# Patient Record
Sex: Male | Born: 1970 | Race: Black or African American | Hispanic: No | Marital: Married | Smoking: Former smoker
Health system: Southern US, Community
[De-identification: ages and names within clinical notes are randomized; demographics above are authoritative.]

## PROBLEM LIST (undated history)

## (undated) DIAGNOSIS — I509 Heart failure, unspecified: Secondary | ICD-10-CM

## (undated) DIAGNOSIS — T4145XA Adverse effect of unspecified anesthetic, initial encounter: Secondary | ICD-10-CM

## (undated) DIAGNOSIS — J069 Acute upper respiratory infection, unspecified: Secondary | ICD-10-CM

## (undated) DIAGNOSIS — I10 Essential (primary) hypertension: Secondary | ICD-10-CM

## (undated) DIAGNOSIS — G5600 Carpal tunnel syndrome, unspecified upper limb: Secondary | ICD-10-CM

## (undated) DIAGNOSIS — D1803 Hemangioma of intra-abdominal structures: Secondary | ICD-10-CM

## (undated) DIAGNOSIS — K760 Fatty (change of) liver, not elsewhere classified: Secondary | ICD-10-CM

## (undated) DIAGNOSIS — K219 Gastro-esophageal reflux disease without esophagitis: Secondary | ICD-10-CM

## (undated) DIAGNOSIS — M67919 Unspecified disorder of synovium and tendon, unspecified shoulder: Secondary | ICD-10-CM

## (undated) DIAGNOSIS — F419 Anxiety disorder, unspecified: Secondary | ICD-10-CM

## (undated) DIAGNOSIS — E119 Type 2 diabetes mellitus without complications: Secondary | ICD-10-CM

## (undated) DIAGNOSIS — G4730 Sleep apnea, unspecified: Secondary | ICD-10-CM

## (undated) DIAGNOSIS — T8859XA Other complications of anesthesia, initial encounter: Secondary | ICD-10-CM

## (undated) DIAGNOSIS — D563 Thalassemia minor: Secondary | ICD-10-CM

## (undated) HISTORY — DX: Sleep apnea, unspecified: G47.30

## (undated) HISTORY — DX: Heart failure, unspecified: I50.9

## (undated) HISTORY — DX: Gastro-esophageal reflux disease without esophagitis: K21.9

## (undated) HISTORY — PX: EYE SURGERY: SHX253

## (undated) HISTORY — DX: Unspecified disorder of synovium and tendon, unspecified shoulder: M67.919

## (undated) HISTORY — DX: Carpal tunnel syndrome, unspecified upper limb: G56.00

## (undated) HISTORY — DX: Type 2 diabetes mellitus without complications: E11.9

## (undated) HISTORY — DX: Fatty (change of) liver, not elsewhere classified: K76.0

---
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
## 2001-11-21 ENCOUNTER — Emergency Department (HOSPITAL_COMMUNITY): Admission: EM | Admit: 2001-11-21 | Discharge: 2001-11-21 | Payer: Self-pay | Admitting: Emergency Medicine

## 2001-11-21 ENCOUNTER — Encounter: Payer: Self-pay | Admitting: Emergency Medicine

## 2001-11-28 ENCOUNTER — Encounter (HOSPITAL_COMMUNITY): Admission: RE | Admit: 2001-11-28 | Discharge: 2001-12-28 | Payer: Self-pay | Admitting: Internal Medicine

## 2002-07-24 ENCOUNTER — Ambulatory Visit (HOSPITAL_COMMUNITY): Admission: RE | Admit: 2002-07-24 | Discharge: 2002-07-24 | Payer: Self-pay | Admitting: Family Medicine

## 2002-07-24 ENCOUNTER — Encounter: Payer: Self-pay | Admitting: Family Medicine

## 2003-12-30 ENCOUNTER — Ambulatory Visit (HOSPITAL_COMMUNITY): Admission: RE | Admit: 2003-12-30 | Discharge: 2003-12-30 | Payer: Self-pay | Admitting: Internal Medicine

## 2004-01-15 ENCOUNTER — Ambulatory Visit: Admission: RE | Admit: 2004-01-15 | Discharge: 2004-01-15 | Payer: Self-pay | Admitting: Internal Medicine

## 2004-01-25 ENCOUNTER — Ambulatory Visit (HOSPITAL_COMMUNITY): Admission: RE | Admit: 2004-01-25 | Discharge: 2004-01-25 | Payer: Self-pay | Admitting: Internal Medicine

## 2004-09-28 ENCOUNTER — Ambulatory Visit (HOSPITAL_COMMUNITY): Admission: RE | Admit: 2004-09-28 | Discharge: 2004-09-28 | Payer: Self-pay | Admitting: Internal Medicine

## 2005-10-05 ENCOUNTER — Ambulatory Visit (HOSPITAL_COMMUNITY): Admission: RE | Admit: 2005-10-05 | Discharge: 2005-10-05 | Payer: Self-pay | Admitting: Family Medicine

## 2005-10-10 ENCOUNTER — Observation Stay (HOSPITAL_COMMUNITY): Admission: AD | Admit: 2005-10-10 | Discharge: 2005-10-11 | Payer: Self-pay | Admitting: *Deleted

## 2005-10-10 ENCOUNTER — Encounter: Payer: Self-pay | Admitting: Emergency Medicine

## 2007-05-02 ENCOUNTER — Ambulatory Visit (HOSPITAL_COMMUNITY): Admission: RE | Admit: 2007-05-02 | Discharge: 2007-05-02 | Payer: Self-pay | Admitting: Internal Medicine

## 2007-07-02 ENCOUNTER — Ambulatory Visit (HOSPITAL_COMMUNITY): Admission: RE | Admit: 2007-07-02 | Discharge: 2007-07-02 | Payer: Self-pay | Admitting: Internal Medicine

## 2008-09-10 ENCOUNTER — Emergency Department (HOSPITAL_COMMUNITY): Admission: EM | Admit: 2008-09-10 | Discharge: 2008-09-10 | Payer: Self-pay | Admitting: Emergency Medicine

## 2008-12-15 ENCOUNTER — Observation Stay (HOSPITAL_COMMUNITY): Admission: EM | Admit: 2008-12-15 | Discharge: 2008-12-16 | Payer: Self-pay | Admitting: Emergency Medicine

## 2008-12-15 ENCOUNTER — Ambulatory Visit: Payer: Self-pay | Admitting: Cardiology

## 2008-12-16 ENCOUNTER — Encounter (INDEPENDENT_AMBULATORY_CARE_PROVIDER_SITE_OTHER): Payer: Self-pay | Admitting: Family Medicine

## 2009-08-17 ENCOUNTER — Ambulatory Visit (HOSPITAL_COMMUNITY): Admission: RE | Admit: 2009-08-17 | Discharge: 2009-08-17 | Payer: Self-pay | Admitting: Family Medicine

## 2010-03-27 ENCOUNTER — Encounter: Admission: RE | Admit: 2010-03-27 | Discharge: 2010-03-27 | Payer: Self-pay | Admitting: Sports Medicine

## 2010-09-16 ENCOUNTER — Emergency Department (HOSPITAL_COMMUNITY): Admission: EM | Admit: 2010-09-16 | Payer: Self-pay | Source: Home / Self Care

## 2010-11-22 LAB — DIFFERENTIAL
Basophils Absolute: 0 10*3/uL (ref 0.0–0.1)
Basophils Absolute: 0 10*3/uL (ref 0.0–0.1)
Basophils Relative: 0 % (ref 0–1)
Eosinophils Absolute: 0.1 10*3/uL (ref 0.0–0.7)
Eosinophils Relative: 1 % (ref 0–5)
Eosinophils Relative: 3 % (ref 0–5)
Lymphocytes Relative: 11 % — ABNORMAL LOW (ref 12–46)
Neutro Abs: 5.5 10*3/uL (ref 1.7–7.7)
Neutrophils Relative %: 67 % (ref 43–77)

## 2010-11-22 LAB — BASIC METABOLIC PANEL
BUN: 5 mg/dL — ABNORMAL LOW (ref 6–23)
CO2: 22 mEq/L (ref 19–32)
CO2: 28 mEq/L (ref 19–32)
Calcium: 9.3 mg/dL (ref 8.4–10.5)
Chloride: 102 mEq/L (ref 96–112)
Creatinine, Ser: 0.7 mg/dL (ref 0.4–1.5)
GFR calc Af Amer: >60 mL/min (ref >60)
GFR calc Af Amer: >60 mL/min (ref >60)
Potassium: 3.9 mEq/L (ref 3.5–5.1)
Sodium: 135 mEq/L (ref 135–145)

## 2010-11-22 LAB — POCT CARDIAC MARKERS
Troponin i, poc: <0.05 ng/mL (ref 0.00–0.09)
Troponin i, poc: <0.05 ng/mL (ref 0.00–0.09)

## 2010-11-22 LAB — CARDIAC PANEL(CRET KIN+CKTOT+MB+TROPI): CK, MB: 0.7 ng/mL (ref 0.3–4.0)

## 2010-11-22 LAB — LIPID PANEL
Cholesterol: 185 mg/dL (ref 0–200)
HDL: 37 mg/dL — ABNORMAL LOW (ref >39)
LDL Cholesterol: 107 mg/dL — ABNORMAL HIGH (ref 0–99)
Total CHOL/HDL Ratio: 5 RATIO

## 2010-11-22 LAB — CBC
HCT: 39.1 % (ref 39.0–52.0)
HCT: 43.1 % (ref 39.0–52.0)
Hemoglobin: 12.8 g/dL — ABNORMAL LOW (ref 13.0–17.0)
MCHC: 32.6 g/dL (ref 30.0–36.0)
MCV: 78.3 fL (ref 78.0–100.0)
Platelets: 210 10*3/uL (ref 150–400)
Platelets: 231 10*3/uL (ref 150–400)
RDW: 13.3 % (ref 11.5–15.5)

## 2010-11-22 LAB — HEMOGLOBIN A1C: Hgb A1c MFr Bld: 9.6 % — ABNORMAL HIGH (ref 4.6–6.1)

## 2010-11-22 LAB — BRAIN NATRIURETIC PEPTIDE: Pro B Natriuretic peptide (BNP): <30 pg/mL (ref 0.0–100.0)

## 2010-11-22 LAB — GLUCOSE, CAPILLARY
Glucose-Capillary: 183 mg/dL — ABNORMAL HIGH (ref 70–99)
Glucose-Capillary: 208 mg/dL — ABNORMAL HIGH (ref 70–99)
Glucose-Capillary: 220 mg/dL — ABNORMAL HIGH (ref 70–99)

## 2010-11-22 LAB — MAGNESIUM: Magnesium: 1.8 mg/dL (ref 1.5–2.5)

## 2010-11-28 LAB — DIFFERENTIAL
Basophils Absolute: 0 10*3/uL (ref 0.0–0.1)
Eosinophils Relative: 1 % (ref 0–5)
Lymphocytes Relative: 12 % (ref 12–46)
Lymphs Abs: 1.2 10*3/uL (ref 0.7–4.0)
Neutro Abs: 7.6 10*3/uL (ref 1.7–7.7)

## 2010-11-28 LAB — CULTURE, BLOOD (ROUTINE X 2)
Culture: NO GROWTH
Report Status: 2022010
Report Status: 2022010

## 2010-11-28 LAB — BASIC METABOLIC PANEL
BUN: 6 mg/dL (ref 6–23)
Calcium: 8.2 mg/dL — ABNORMAL LOW (ref 8.4–10.5)
GFR calc non Af Amer: >60 mL/min (ref >60)
Glucose, Bld: 140 mg/dL — ABNORMAL HIGH (ref 70–99)
Potassium: 3.7 mEq/L (ref 3.5–5.1)
Sodium: 135 mEq/L (ref 135–145)

## 2010-11-28 LAB — CBC
HCT: 37.7 % — ABNORMAL LOW (ref 39.0–52.0)
Hemoglobin: 12.5 g/dL — ABNORMAL LOW (ref 13.0–17.0)
Platelets: 173 10*3/uL (ref 150–400)
RDW: 13.4 % (ref 11.5–15.5)
WBC: 9.4 10*3/uL (ref 4.0–10.5)

## 2010-12-27 NOTE — Discharge Summary (Signed)
NAMETEX, [name] NO.:  [id number]   MEDICAL RECORD NO.:  [id number]          PATIENT TYPE:  OBV   LOCATION:  A302                          FACILITY:  [facility]   PHYSICIAN:  [name], DO    DATE OF BIRTH:  [birth year]   DATE OF ADMISSION:  12/15/2008  DATE OF DISCHARGE:  05/05/2010LH                               DISCHARGE SUMMARY   ADMISSION DIAGNOSES:  Chest pain, hypertension, obesity, and left-sided  numbness.   DISCHARGE DIAGNOSES:  Chest pain, resolved; left-sided numbness,  resolving; hypertension; obesity; and impaired fasting glucose with  pending hemoglobin A1c.   His testing that was done includes on May 4, a portable chest x-ray,  which shows no clear acute cardiopulmonary process, cephalization of the  pulmonary vasculature, bilateral atelectasis.  He had CT of the head,  which showed no evidence of acute intracranial abnormality and he had an  MRA and an MRI.  The MRI was negative and the MRA was negative as well.  The consults include Dr. Gerilyn Pilgrim and [facility] and his primary  care physician is Dr. Loleta Chance.   HOSPITAL COURSE:  The patient is a 40 year old African American male who  was admitted to the service of [facility] for the above diagnoses.  He  stated that it just started prior to admission with the numbness and  tingling of his left side starting from the tip of his head all the way  down to his toes.  He was admitted as above.  Started on IV fluids.  Blood work was obtained and Adult nurse Cardiology was consulted.  Also, DVT  and GI prophylaxis was instituted.  The above testing was done.  Neurology would like him to follow up, however, this could be a result  of a TIA.  There are no residual effects from this; however, he does  have the residual left-sided numbness.  We will have him follow up with  Dr. Gerilyn Pilgrim in about 2 weeks to see if it is improved.  Also for his  hypertension, he was started on amlodipine by Dr.  Dietrich Pates and the  Diovan was stopped.  His discharge planning includes he is to increase  his activity slowly.  He is to go on a low-sodium heart-healthy diet.  He is to stop smoking.  I counseled he and his wife extensively on ways  to lose weight and to change their diet, so they both can lose weight to  be healthier.  He is to set up an appointment with Dr. Gerilyn Pilgrim upon  discharge in 2 weeks and with [facility] in 2 weeks and follow  up with Dr. Loleta Chance on diabetes next week.  So, the patient's condition on  discharge is stable and his disposition will be to home.  I spent  greater than 30 minutes on this discharge.       [name], DO  Electronically Signed     CB/MEDQ  D:  12/16/2008  T:  12/17/2008  Job:  [id number]

## 2010-12-27 NOTE — H&P (Signed)
[name] Gregory, [name] Gregory NO.:  [id number]   MEDICAL RECORD NO.:  [id number]          PATIENT TYPE:  INP   LOCATION:  A302                          FACILITY:  [facility]   PHYSICIAN:  [name], DO    DATE OF BIRTH:  [birth year]   DATE OF ADMISSION:  12/15/2008  DATE OF DISCHARGE:  LH                              HISTORY & PHYSICAL   HISTORY OF PRESENT ILLNESS:  The patient is a 40 year old African male  who presented to the [facility] Emergency Room after having chest pain  only for less than an hour.  He stated the chest pain was sudden.  It  was rated at 10 out of 10.  It was located on the left side.  After a  review of his history, the patient has had a cardiac catheterization in  the past.  His primary care physician is Dr. Loleta Chance.  He did report having  some vomiting with this episode.  The pain also radiated to the left  side of his head.  It was characterized as being dull.  Nothing  aggravated it.  Nothing relieved it.  Nothing relieved it.  It was  associated with diaphoresis, dyspnea and numbness 50,009 of nausea.   PAST MEDICAL HISTORY:  Significant for hypertension.   FAMILY HISTORY:  Congestive heart failure, unspecified.   SURGICAL HISTORY:  He has had eye corrective surgery and had a gland  removed from his neck, uncertain of what it was.   SOCIAL HISTORY:  Nondrinker.  No drug use.  He is a current smoker.   MEDICATIONS:  Diovan 160/ 12.5 once a day.   REVIEW OF SYSTEMS:  All pertinent positives noted above. Positive chest  pain.  CONSTITUTIONAL:  Negative.  EYES:  Negative.  EARS, NOSE, MOUTH,  THROAT:  Negative.  CARDIOVASCULAR:  Positive for chest pain,  diaphoresis, dyspnea.  RESPIRATORY:  Positive for dyspnea.  Gastrointestinal:  Negative.  MUSCULOSKELETAL:  Negative.  SKIN:  Negative.  NEURO:  Positive for numbness.   PHYSICAL EXAMINATION:  CURRENT VITAL SIGNS:  Blood pressure 152/104,  pulse rate 106, respirations 24, temperature 98.7.  The patient is a 40 year old African male who is obese, well-developed,  well-nourished in no acute distress.  Head is normocephalic, atraumatic.  Eyes are PERRL.  EOMI.  Ears, nose, mouth and throat within normal  limits.  NECK:  Neck is supple.  No lymphadenopathy.  HEART:  Regular rate and rhythm.  No murmur, rub or gallop.  LUNGS:  Clear to auscultation bilaterally.  No rales, wheezes or  rhonchi.  ABDOMEN:  Soft, nontender and nondistended.  EXTREMITIES:  Positive pulses.  NEUROLOGIC:  Cranial nerves II-XII grossly intact in all extremities.  Motor is intact.  He is A plus O  x 3.  SKIN:  Cool and dry.  No ulcers or sores noted.   LABORATORY DATA:  White count 9.8, hemoglobin of 14.3, hematocrit 43.1,  platelet count of 231.  Chest x-ray shows no clear acute cardiopulmonary  process, cephalization of pulmonary vasculature, bibasilar atelectasis.  Troponins are negative.  Basic metabolic panel:  Sodium 135, potassium  4.0, chloride 102, carbon dioxide 22, glucose 275, BUN 5, creatinine  0.7.  D-dimer 0.23.  CT of the head without contrast:  No evidence of  intracranial abnormality or left-sided facial numbness.  EKG is tachy  sinus.  Everything is within normal limits.   ASSESSMENT:  1. Chest pain.  2. Hypertension.  3. Obesity.  4. Left-sided numbness.   PLAN:  Admit the patient to service of [facility] under TP with  telemetry.  Will place him on observation.  Will put O2 on him, saline  lock and will get blood work in the morning.  Also order 2-D echo. Will  place him on his home medications.  Will also give him pain medication  and nitroglycerin paste.  Put him on a patch for his smoking and will  counsel him on smoking cessation.  Will also wean him off the nitrogen  drip and put him on Metoprolol  50 mg b.i.d.  Will consult [facility]  cardiology and also Dr. Gerilyn Pilgrim,  for left-sided facial numbness.  Will  go ahead and do DVT and GI prophylaxis.  will continue to monitor  the  patient and change therapy as necessary.      [name], DO  Electronically Signed     CB/MEDQ  D:  12/15/2008  T:  12/15/2008  Job:  [id number]

## 2010-12-27 NOTE — Consult Note (Signed)
NAMEJANSEN, SCIUTO NO.:  [id number]   MEDICAL RECORD NO.:  [id number]          PATIENT TYPE:  OBV   LOCATION:  A302                          FACILITY:  [facility]   PHYSICIAN:  Gerrit Friends. Dietrich Pates, MD, FACCDATE OF BIRTH:  [birth year]   DATE OF CONSULTATION:  12/16/2008  DATE OF DISCHARGE:  12/16/2008                                 CONSULTATION   REFERRING PHYSICIAN:  Dr. Elige Radon.   PRIMARY CARE PHYSICIAN:  Annia Friendly. Hill, MD   PRIMARY CARDIOLOGIST:  Previously, [place].   HISTORY OF PRESENT ILLNESS:  A 40 year old gentleman with a history of  hypertension and cigarette smoking but negative cardiac catheterization  in 2007 now presents with recurrent chest discomfort.  Mr. [name] Gregory has no  known cardiac disease.  He has done well since his previous admission 3  years ago without much in the way of recurrent chest discomfort.  He  does have sleep apnea for which he uses a positive pressure device at  night with good benefit.  On the day of admission, he noted the sudden  onset of sharp chest pain with a background of moderate chest pressure.  Intermittently, the pain was intense.  He had a sense that he could not  take a deep breath although this was not due to pleuritic-type  discomfort.  He felt as if breathing was abnormally difficult, but not  truly short of breath.  He did have some nausea with emesis.  He  subsequently developed some numbness and paresthesias around the left  side of the face that spread down the left body.  There was no speech  disturbance.  There was no localized weakness.  He subsequently  developed a headache, but this was after the administration of  nitroglycerin.  He experienced benefit from sublingual and then  intravenous nitroglycerin, but still does not feel as if he is  completely normal.  He is not experiencing any significant chest  discomfort nor dyspnea at present.   Past medical history is unremarkable except as noted  above.   FAMILY HISTORY:  Positive for congestive heart failure.   PAST SURGICAL HISTORY:  Prior ophthalmologic surgery for amblyopia;  excision of right parotid gland.   SOCIAL HISTORY:  Works for [employer]; no use of alcohol; does smoke  cigarettes with a total consumption of approximately 20 pack-years;  recent stress in his life including job related and health related in  close relatives.   His only medication is Diovan/HCT 160/12.5 daily.  He has no medical  allergies.   REVIEW OF SYSTEMS:  Notable for a history of possible peptic ulcer  approximately 4 years ago.  He was treated for H. pylori at that time.  He has had no recurrent GI symptoms.  At that presentation, his  principle issue was nausea without significant chest or abdominal pain.  He eats regular diet.  He requires corrective lenses for near and far  vision.  He has never been treated for anxiety or other psychologic  disorders.  All other systems reviewed and are negative.   PHYSICAL EXAMINATION:  GENERAL:  Pleasant, overweight gentleman in no  acute distress.  VITAL SIGNS:  The weight is 150 kg, height 74 inches, temperature 97.7,  heart rate 72 and regular, respirations 18, blood pressure 135/85.  The  patient reports diastolic blood pressures of 130 in the emergency  department.  NECK:  No jugular venous distention; normal carotid upstrokes without  bruits; surgical scar at the angle of the jaw on the right.  HEENT:  Disconjugate gaze; EOMs full; pupils equal, round, and reactive  to light; normal oral mucosa.  ENDOCRINE:  No thyromegaly.  HEMATOPOIETIC:  No adenopathy.  SKIN:  No significant lesions.  PSYCHIATRIC:  Alert and oriented; normal affect.  LUNGS:  Clear with good air movement bilaterally.  CARDIAC:  Normal first and second heart sounds.  Normal PMI.  ABDOMEN:  Soft and nontender; normal bowel sounds; no masses; no  organomegaly.  EXTREMITIES:  Normal distal pulses; no edema.   NEUROLOGIC:  Symmetric strength and tone; normal cranial nerves.   EKG:  Normal sinus rhythm; left atrial abnormality; delayed R-wave  progression; otherwise normal.  No change when compared to a previous  tracing of October 11, 2005.   Laboratory otherwise shows a normal CBC except for slightly reduced MCV  of 77, normal coagulation studies, normal D-dimer, capillary blood  glucose of 183, glucose values of 198 and 275, and normal cardiac  markers.  BNP level is normal.   CT of the head is normal.  Chest x-ray is negative.   IMPRESSION:  Mr. [name] Gregory presents with recurrent chest discomfort.  At the  time of his previous admission, he had significant chest discomfort as  well, but this was in the setting of a recent bronchitis.  The  likelihood of significant atherosclerotic coronary artery disease at  present is fairly remote.  He is advised to minimize risk factors  including maintaining optimal control of hypertension and discontinuing  the use of tobacco products.  Lipid profile has been acceptable in the  past.   The exact etiology of his chest discomfort is uncertain.  Blood pressure  was quite high in the emergency department - this could represent a  hypertensive urgency; however, his blood pressure has returned to good  values without particularly notable.      Gerrit Friends. Dietrich Pates, MD, [facility]  Electronically Signed     RMR/MEDQ  D:  12/16/2008  T:  12/17/2008  Job:  [id number]

## 2010-12-27 NOTE — Consult Note (Signed)
NAMEBUBBER, ROTHERT NO.:  [id number]   MEDICAL RECORD NO.:  [id number]          PATIENT TYPE:  OBV   LOCATION:  A302                          FACILITY:  [facility]   PHYSICIAN:  Gerrit Friends. Dietrich Pates, MD, FACCDATE OF BIRTH:  [birth year]   DATE OF CONSULTATION:  12/16/2008  DATE OF DISCHARGE:  12/16/2008                                 CONSULTATION      Gerrit Friends. Dietrich Pates, MD, [facility] : [facility]  Electronically Signed     RMR/MEDQ  D:  12/16/2008  T:  12/16/2008  Job:  [id number]

## 2010-12-27 NOTE — Consult Note (Signed)
[name] Gregory, [name] Gregory                 ACCOUNT NO.:  [id number]   MEDICAL RECORD NO.:  [id number]          PATIENT TYPE:  OBV   LOCATION:  A302                          FACILITY:  [facility]   PHYSICIAN:  Kofi A. Gerilyn Pilgrim, M.D. DATE OF BIRTH:  May 23, 1971   DATE OF CONSULTATION:  12/16/2008  DATE OF DISCHARGE:  12/16/2008                                 CONSULTATION   NEUROLOGICAL CONSULTATION   REASON FOR CONSULTATION:  Left sided numbness.   HISTORY OF PRESENT ILLNESS:  The patient is a 40 year old black male who  developed the acute onset of retrosternal chest discomfort associated  with numbness, tingling and dysesthesia of the left upper extremity,  left neck, left facial region and essentially the left hemibody.  The  symptoms continue to persist, although there is some improvement.  This  has been ongoing for the past few hours.  The patient has had a cardiac  catheterization in the past, about 3 years ago, which was unrevealing.  He denies any other focal neurological symptoms in the past.  There are  some reports that he did have some dyspnea, nausea, diaphoresis with his  symptoms.   PAST MEDICAL HISTORY:  The past medical history is significant for  hypertension.   FAMILY HISTORY:  Congestive heart failure.   PAST SURGICAL HISTORY:  The patient has had corrective eye surgery.  He  has also had a gland removed from the neck.   SOCIAL HISTORY:  He is married.  He does smoke cigarettes.  No alcohol  or illicit drug use.   MEDICATIONS:  Diovan 160/12.5 daily.   REVIEW OF SYSTEMS:  The patient has had improvement in his symptoms.  He  still reports having more pain and tingling now in the left side.  He  does report having some swelling involving the left hand, although none  is clearly seen on physical examination.   PHYSICAL EXAMINATION:  GENERAL:  Physical examination shows an obese  man.  VITAL SIGNS:  Temperature 97.2, pulse 70, respirations 20, blood  pressure 121/83.   He does have a CPAP machine at his bedside for known  history of sleep apnea.  HEENT:  Neck is supple. Head is normocephalic, atraumatic.  ABDOMEN:  Obese and soft.  EXTREMITIES:  No significant edema.  MENTATION:  He is awake and alert.  He converses well.  Speech, language  and cognition are intact.  NEUROLOGICAL:  Cranial nerve evaluation shows pupils equal, round,  reactive to light and accommodation.  Extraocular movements are intact.  Facial muscle strength is symmetric.  Tongue midline.  Uvula midline.  Shoulder shrug is normal.  Motor exam shows no pronator drift.  He has  good tone, bulk and strength.  Reflexes are markedly diminished to  absent in the legs.  They are present in the upper extremities, but  diminished.  Plantar's are both downgoing.  Sensation is impaired to  light touch and temperature involving the left upper extremity, left  trunk and left leg.  Coordination:  No dysmetria, no tremors. No past  pointing.   LABORATORY DATA:  Sodium  135, potassium 4.0, chloride 102, CO2 22, BUN  5, creatinine 0.7, glucose 275, calcium is 9.3.  Head CT scan is  negative.  Troponin-I is normal at 0.05.  White blood cell count 9.8,  hemoglobin 14.3, platelet count 231,000.   ASSESSMENT:  Acute onset of left hemibody sensory loss, chest pain,  nausea and vomiting.  Differential diagnosis includes subcortical  infarct, multiple sclerosis, seizures, primary cardiac event, migraines.   RECOMMENDATIONS:  He has an MRI ordered and will follow those results  for further recommendations.  He is to continue aspirin and blood  pressure control.  Also suggest weight loss and continued use of CPAP.   Thank you for this consultation .      Kofi A. Gerilyn Pilgrim, M.D.  Electronically Signed     KAD/MEDQ  D:  12/17/2008  T:  12/17/2008  Job:  [id number]

## 2010-12-30 NOTE — Discharge Summary (Signed)
NAMEKIYON, FIDALGO NO.:  [id number]   MEDICAL RECORD NO.:  [id number]          PATIENT TYPE:  OBV   LOCATION:  2034                         FACILITY:  [facility]   PHYSICIAN:  Darlin Priestly, MD  DATE OF BIRTH:  [birth year]   DATE OF ADMISSION:  10/10/2005  DATE OF DISCHARGE:  10/11/2005                                 DISCHARGE SUMMARY   ADMISSION DIAGNOSES:  1.  Unstable chest pain.  2.  Abnormal EKG.  3.  Hypertension.  4.  Obesity.  5.  Sleep apnea.  6.  Family history of cerebrovascular accident.   DISCHARGE DIAGNOSES:  1.  Unstable chest pain.  2.  Abnormal EKG.  3.  Hypertension.  4.  Obesity.  5.  Sleep apnea.  6.  Family history of cerebrovascular accident.   PROCEDURES:  Cardiac catheterization October 10, 2005, Dr. Jenne Campus.   BRIEF HISTORY:  Patient is a 40 year old, African American male, who  presented to the ER with chest discomfort after being treated for  bronchitis. Prednisone was recently added, and he presented to the ER at  [facility] with increasing chest discomfort. EKG in the ER was obtained,  which showed sinus rhythm with voltage for left ventricular hypertrophy and  left atrial enlargement. Because of this, he was transferred from [facility] ER to the cath lab at [facility] for emergent cath.   PAST MEDICAL HISTORY:  Patient has a history of hypertension, was treated,  lost weight and then was taken off his medications. He also has sleep apnea  and is on CPAP at home. He has a history of being markedly overweight.   SOCIAL HISTORY:  He is a smoker.   FAMILY HISTORY:  He has a brother age 44 with congestive heart failure, a  father with a history of CVA at 34.   For further history and physical, please see the dictated note.   HOSPITAL COURSE:  Patient was admitted and taken to the cath lab. He had  normal coronary arteries with an EF of 50%, low normal ejection fraction. He  was stable overnight with  no further problems. He was seen the following  morning, and it was Dr. Verl Dicker opinion that his discomfort was:  1.  Musculoskeletal chest pain.  2.  Hypertension.  3.  Obesity with a BMI of 40.  4.  Obstructive sleep apnea.   He has just been started on Avapro/HCTZ, and we are going to continue that  with plans to increase it if his blood pressure does not come down. He has  had 2 doses of the Avapro and 2 doses of felodipine. He had some bleeding  from his groin site and was seen by Dr. Jacinto Halim, and was felt to be stable,  although it is still quite sore. We will discharge him home on the following  meds:  1.  Diovan HCT 12.5/80 one daily.  2.  Felodipine 5 mg daily.  3.  Wellbutrin 150 mg 1 b.i.d.   He was also given a prescription for Chantix, and he is hoping  he will be  able to afford to fill that. It is a tier 2, as is the Diovan HCT on his  medicine plan. We told him he could continue his bronchitis medicines,  resume his CPAP before, light activity for 72 hours, return to work on October 16, 2005. He is to maintain a low-fat, low-carb diet, along with diet drinks.  He is to take his blood pressure at home twice a day and record it and  discontinue smoking. We will have him come back to see Dr. Jenne Campus in about  2 weeks in the [address] office, and he can return to Dr. Felecia Shelling as  directed.      Eber Hong, P.A.      Darlin Priestly, MD  Electronically Signed    WDJ/MEDQ  D:  10/11/2005  T:  10/11/2005  Job:  [id number]   cc:   Tesfaye D. Felecia Shelling, MD  Fax: [phone]

## 2010-12-30 NOTE — Cardiovascular Report (Signed)
NAMEBLAYNE, [name] NO.:  [id number]   MEDICAL RECORD NO.:  [id number]          PATIENT TYPE:  INP   LOCATION:  2034                         FACILITY:  [facility]   PHYSICIAN:  Darlin Priestly, MD  DATE OF BIRTH:  09-Aug-1971   DATE OF PROCEDURE:  10/10/2005  DATE OF DISCHARGE:                              CARDIAC CATHETERIZATION   PROCEDURES PERFORMED:  1.  Left heart catheterization.  2.  Coronary angiography.  3.  Left ventriculogram.  4.  Abdominal aortogram.   CARDIOLOGIST:  Darlin Priestly, M.D.   COMPLICATIONS:  None.   INDICATIONS:  [name] is a 40 year old male with a history of  hypertension, tobacco abuse, a positive family history of CHF with premature  death.  The patient presented to [facility] ER on the morning of October 10, 2005 complaining of substernal chest pain, which began at approximately  one in the morning.  He was noted to have mild J point elevation with  ongoing chest pain, which was nitro responsive.  Because of his family  history, mild EKG changes and ongoing symptoms he is now referred for  cardiac catheterization to rule out significant CAD.   DESCRIPTION OF THE PROCEDURE:  After obtaining informed written consent the  patient was brought to the cardiac cath lab where the right and left groins  were shaved, prepped and draped in the usual sterile fashion.  EKG  monitoring was established.  Using the modified Seldinger technique a number  6 French arterial sheath was inserted into the right femoral artery.  Six  French diagnostic catheters were used to perform diagnostic angiography.   RESULTS:   ANGIOGRAPHIC DATA:  Left Main:  The left main is a large vessel with no  significant disease.   LAD:  The LAD is a large vessel coursing the __________  two diagonal  branches.  The LAD has no significant disease.  The first diagonal is a  large vessel, which runs as a ramus and has no significant disease.  The  second  diagonal is a medium size vessel with no significant disease.   Circumflex Artery:  The left circumflex is a large vessel, which is  dominant, and give rise to the one obtuse marginal plus the bifurcating PDA  and posterolateral branch.  There is no significant disease in the AV groove  circumflex.  The first __________  is a large vessel that bifurcates  distally with no significant disease.  The PDA and the posterolateral  branch, which arises __________  from the AV circumflex, large vessel with  no significant disease.   Right Coronary Artery:  The right coronary artery is a small-to-medium size  vessel, which is nondominant.   VENTRICULOGRAPHIC DATA:  Left ventriculogram reveals low normal EF of  approximately 50%.   AORTOGRAPHIC DATA:  Abdominal aortogram reveals no evidence of significant  enlarged renal artery stenosis.   HEMODYNAMIC DATA:  Systemic arterial pressure 129/102.  LV systemic pressure  1208.  LVEDP 10.   CONCLUSIONS:  1.  No significant coronary artery disease.  2.  Low  normal ejection fraction.  3.  No evidence of renal artery stenosis.  4.  Systemic hypertension.      Darlin Priestly, MD  Electronically Signed     RHM/MEDQ  D:  10/10/2005  T:  10/11/2005  Job:  [id number]

## 2010-12-30 NOTE — H&P (Signed)
[name], [name] NO.:  [id number]   MEDICAL RECORD NO.:  [id number]          PATIENT TYPE:  INP   LOCATION:  2854                         FACILITY:  [facility]   PHYSICIAN:  Darlin Priestly, MD  DATE OF BIRTH:  1970-09-28   DATE OF ADMISSION:  10/10/2005  DATE OF DISCHARGE:                                HISTORY & PHYSICAL   CHIEF COMPLAINT:  Chest pain.   HISTORY OF PRESENT ILLNESS:  Patient is a 40 year old male who has been  followed by Dr. Felecia Shelling.  He has no prior history of coronary disease.  Over  the past week he has seen Dr. Felecia Shelling on a couple of occasions for cough.  He  has been treated for bronchitis with a Z-PACK twice and prednisone was just  added yesterday.  He presented to [facility] ER this morning with increasing  chest pain.  His EKG was abnormal with suggestion of ST elevation in the  anterior leads and LVH.  Patient was transferred to [facility] urgently for  catheterization.  He was treated with nitroglycerin, aspirin, and heparin at  [facility] ER and taken to the catheterization laboratory on arrival to  [facility]  His past medical history is unremarkable for hypertension or  diabetes, although he does have LVH on his EKG.   CURRENT MEDICATIONS:  None on a regular basis but recently he has been  treated with a Z-PACK and prednisone was started yesterday as well as  Allegra.   He has no known drug allergies.   SOCIAL HISTORY:  He is married.  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  He  has no children.  He does smoke.   FAMILY HISTORY:  Remarkable for coronary disease.  His father had a stroke  in his 80s.  He has one brother who died of heart failure in his 20s.   REVIEW OF SYSTEMS:  Remarkable for positive sleep study in June of 2005 and  the patient is on CPAP.  He does wear glasses.  He has not been using a CPAP  over the past week because of his congestion.   PHYSICAL EXAMINATION:  VITAL SIGNS:  Blood pressure 157/101, pulse 90, O2  saturation is 100%, respirations 16.  GENERAL:  He is a well-developed, overweight African-American male in no  acute distress.  HEENT:  Normocephalic.  Extraocular movements are intact.  Sclera is non-  icteric.  He does wear glasses.  NECK:  Without bruit, without JVD.  CHEST:  Clear to auscultation, percussion.  CARDIAC:  Regular rate and rhythm without obvious murmur, rub, or gallop.  Normal S1, S2.  ABDOMEN:  Obese, nontender.  EXTREMITIES:  Without edema.  Pulses are 2+/4.  NEUROLOGIC:  Grossly intact.  He is awake, alert, oriented, cooperative.  Moves all extremities without obvious deficit.   LABORATORIES:  White count 13.9, hemoglobin 14.4, hematocrit 44.8, platelets  284.  Sodium 137, potassium 4, BUN 8, creatinine 0.8.  SGOT 55, SGPT 89.  Troponin is negative x1.  INR 0.9.  EKG reveals sinus rhythm, LVH by  voltage.  IMPRESSION:  1.  Chest pain consistent with unstable angina.  2.  Abnormal EKG.  3.  Hypertension with left ventricular hypertrophy on EKG.  4.  Obesity.  5.  Sleep apnea.  The patient is on CPAP.  6.  Family history of cerebrovascular accident.  7.  History of smoking.   PLAN:  Patient was taken to the catheterization laboratory for urgent  catheterization by Dr. Jenne Campus.      [name] Gregory, P.A.      Darlin Priestly, MD  Electronically Signed    LKK/MEDQ  D:  10/10/2005  T:  10/10/2005  Job:  [id number]   cc:   Tesfaye D. Felecia Shelling, MD  Fax: [phone]

## 2010-12-30 NOTE — Procedures (Signed)
NAME:  [name] Gregory, HOMMES NO.:  [id number]   MEDICAL RECORD NO.:  [id number]          PATIENT TYPE:  OUT   LOCATION:  SLEEP LAB                     FACILITY:  [facility]   PHYSICIAN:  Marcelyn Bruins, M.D. [facility] DATE OF BIRTH:  06-Nov-1970   DATE OF ADMISSION:  01/15/2004  DATE OF DISCHARGE:  01/15/2004                              NOCTURNAL POLYSOMNOGRAM   REFERRING PHYSICIAN:  Avon Gully, M.D.   INDICATION FOR STUDY:  Hypersomnia with sleep apnea.  Epworth sleepiness  score was 17.   SLEEP ARCHITECTURE:  The patient had a total sleep time of 375 minutes with  a paucity of REM and slow wave sleep.  Sleep latency was 12.5 minutes with  REM onset at 142 minutes.   IMPRESSION/RECOMMENDATIONS:  1. The patient underwent a split-night study, where he was found to have 158     obstructive events in the first 96 minutes of sleep.  This gave him a     respiratory disturbance index of 99 events per hour with desaturation as     low at 78%.  Events were not positional.  By split-night protocol CPAP     was initiated with a medium comfort select mask and titrated up to a     level of 13 cm with excellent control of events.   2. No cardiac arrhythmias were noted during the study.   3. Very loud snoring noted prior to CPAP initiation.   4.  Small numbers of leg jerks with mild sleep disruption.  Clinical  correlation is suggested if the patient fails to respond appropriately to  CPAP therapy.                                   ______________________________                                Marcelyn Bruins, M.D. [facility]     KC/MEDQ  D:  02/25/2004 15:13:03  T:  02/26/2004 14:55:44  Job:  [id number]   cc:   Tesfaye D. Felecia Shelling, M.D.  [address]  [place]  [place] [address]  Fax: [phone]

## 2014-09-17 ENCOUNTER — Encounter (HOSPITAL_COMMUNITY): Payer: Self-pay | Admitting: *Deleted

## 2014-09-17 ENCOUNTER — Emergency Department (HOSPITAL_COMMUNITY)
Admission: EM | Admit: 2014-09-17 | Discharge: 2014-09-18 | Disposition: A | Discharge status: Home / Self Care | Payer: 59 | Attending: Emergency Medicine | Admitting: Emergency Medicine

## 2014-09-17 DIAGNOSIS — Z760 Encounter for issue of repeat prescription: Secondary | ICD-10-CM

## 2014-09-17 DIAGNOSIS — Z720 Tobacco use: Secondary | ICD-10-CM | POA: Insufficient documentation

## 2014-09-17 DIAGNOSIS — H748X9 Other specified disorders of middle ear and mastoid, unspecified ear: Secondary | ICD-10-CM | POA: Insufficient documentation

## 2014-09-17 DIAGNOSIS — J069 Acute upper respiratory infection, unspecified: Secondary | ICD-10-CM | POA: Diagnosis not present

## 2014-09-17 DIAGNOSIS — R059 Cough, unspecified: Secondary | ICD-10-CM

## 2014-09-17 DIAGNOSIS — I10 Essential (primary) hypertension: Secondary | ICD-10-CM | POA: Insufficient documentation

## 2014-09-17 DIAGNOSIS — R05 Cough: Secondary | ICD-10-CM | POA: Insufficient documentation

## 2014-09-17 DIAGNOSIS — R0789 Other chest pain: Secondary | ICD-10-CM | POA: Diagnosis present

## 2014-09-17 HISTORY — DX: Essential (primary) hypertension: I10

## 2014-09-17 NOTE — ED Notes (Addendum)
Nasal and chest congestion, sore throat, lt ear hurts.  Out of bp med

## 2014-09-18 ENCOUNTER — Emergency Department (HOSPITAL_COMMUNITY): Payer: 59

## 2014-09-18 MED ORDER — PREDNISONE 20 MG PO TABS: ORAL_TABLET | ORAL | Status: DC

## 2014-09-18 MED ORDER — ALBUTEROL SULFATE HFA 108 (90 BASE) MCG/ACT IN AERS
2.0000 | INHALATION_SPRAY | Freq: Once | RESPIRATORY_TRACT | Status: AC
  Administered 2014-09-18: 2 via RESPIRATORY_TRACT
  Filled 2014-09-18: qty 6.7

## 2014-09-18 MED ORDER — ALBUTEROL SULFATE (2.5 MG/3ML) 0.083% IN NEBU
2.5000 mg | INHALATION_SOLUTION | Freq: Once | RESPIRATORY_TRACT | Status: AC
  Administered 2014-09-18: 2.5 mg via RESPIRATORY_TRACT
  Filled 2014-09-18: qty 3

## 2014-09-18 MED ORDER — IPRATROPIUM-ALBUTEROL 0.5-2.5 (3) MG/3ML IN SOLN
3.0000 mL | Freq: Once | RESPIRATORY_TRACT | Status: AC
  Administered 2014-09-18: 3 mL via RESPIRATORY_TRACT
  Filled 2014-09-18: qty 3

## 2014-09-18 MED ORDER — GUAIFENESIN-CODEINE 100-10 MG/5ML PO SYRP: 10.0000 mL | ORAL_SOLUTION | Freq: Three times a day (TID) | ORAL | Status: DC | PRN

## 2014-09-18 MED ORDER — VALSARTAN-HYDROCHLOROTHIAZIDE 320-25 MG PO TABS: 1.0000 | ORAL_TABLET | Freq: Every day | ORAL | Status: DC

## 2014-09-18 NOTE — Discharge Instructions (Signed)
Cough, Adult  A cough is a reflex that helps clear your throat and airways. It can help heal the body or may be a reaction to an irritated airway. A cough may only last 2 or 3 weeks (acute) or may last more than 8 weeks (chronic).  CAUSES Acute cough:  Viral or bacterial infections. Chronic cough:  Infections.  Allergies.  Asthma.  Post-nasal drip.  Smoking.  Heartburn or acid reflux.  Some medicines.  Chronic lung problems (COPD).  Cancer. SYMPTOMS   Cough.  Fever.  Chest pain.  Increased breathing rate.  High-pitched whistling sound when breathing (wheezing).  Colored mucus that you cough up (sputum). TREATMENT   A bacterial cough may be treated with antibiotic medicine.  A viral cough must run its course and will not respond to antibiotics.  Your caregiver may recommend other treatments if you have a chronic cough. HOME CARE INSTRUCTIONS   Only take over-the-counter or prescription medicines for pain, discomfort, or fever as directed by your caregiver. Use cough suppressants only as directed by your caregiver.  Use a cold steam vaporizer or humidifier in your bedroom or home to help loosen secretions.  Sleep in a semi-upright position if your cough is worse at night.  Rest as needed.  Stop smoking if you smoke. SEEK IMMEDIATE MEDICAL CARE IF:   You have pus in your sputum.  Your cough starts to worsen.  You cannot control your cough with suppressants and are losing sleep.  You begin coughing up blood.  You have difficulty breathing.  You develop pain which is getting worse or is uncontrolled with medicine.  You have a fever. MAKE SURE YOU:   Understand these instructions.  Will watch your condition.  Will get help right away if you are not doing well or get worse. Document Released: 01/27/2011 Document Revised: 10/23/2011 Document Reviewed: 01/27/2011 Exit[employer] Patient Information 2015 [employer]. This information is not intended  to replace advice given to you by your health care provider. Make sure you discuss any questions you have with your health care provider.  

## 2014-09-18 NOTE — ED Provider Notes (Signed)
CSN: [id number]     Arrival date & time 09/17/14  2118 History   First MD Initiated Contact with Patient 09/18/14 0025     Chief Complaint  Patient presents with  . URI     (Consider location/radiation/quality/duration/timing/severity/associated sxs/prior Treatment) HPI  [name] Gregory is a 44 y.o. male who presents to the Emergency Department complaining of nasal congestion, sinus pressure, cough and chest tightness for two weeks.  Cough has been  occassionally productive of thick , yellow sputum and seems to be worse with exertion.  He also reports left ear pain and sore throat.  He has been taking OTC cold medications without relief.  He states that he has had pneumonia several years ago and he is concerned that it may be reoccurring.  He has recently moved back to this area and has established care with Dr. Berdine Addison, but does not have an appt until Feb 22 and states he has been without is blood pressure medication for approximately 2 months.  He denies dizziness, chest pain, shortness of breath, vomiting or fever.     Past Medical History  Diagnosis Date  . Hypertension    Past Surgical History  Procedure Laterality Date  . Eye surgery     History reviewed. No pertinent family history. History  Substance Use Topics  . Smoking status: Current Every Day Smoker  . Smokeless tobacco: Not on file  . Alcohol Use: No    Review of Systems  Constitutional: Negative for fever, chills and appetite change.  HENT: Positive for congestion, ear pain, rhinorrhea and sinus pressure. Negative for facial swelling, sore throat and trouble swallowing.   Respiratory: Positive for cough and chest tightness. Negative for shortness of breath and wheezing.   Cardiovascular: Negative for chest pain.  Gastrointestinal: Negative for nausea, vomiting, abdominal pain and abdominal distention.  Genitourinary: Negative for dysuria, flank pain and difficulty urinating.  Musculoskeletal: Negative for myalgias,  arthralgias, neck pain and neck stiffness.  Skin: Negative for rash.  Neurological: Negative for dizziness, syncope, facial asymmetry, speech difficulty, weakness, numbness and headaches.  Hematological: Negative for adenopathy.  Psychiatric/Behavioral: Negative for confusion.  All other systems reviewed and are negative.     Allergies  Review of patient's allergies indicates no known allergies.  Home Medications   Prior to Admission medications   Not on File   BP 168/114 mmHg  Pulse 95  Temp(Src) 98.1 F (36.7 C) (Oral)  Resp 20  Ht 6\' 2"  (1.88 m)  Wt 289 lb (131.09 kg)  BMI 37.09 kg/m2  SpO2 99% Physical Exam  Constitutional: He is oriented to person, place, and time. He appears well-developed and well-nourished. No distress.  HENT:  Head: Normocephalic and atraumatic.  Right Ear: Ear canal normal. No mastoid tenderness. Tympanic membrane is not perforated, not erythematous and not bulging. No hemotympanum.  Left Ear: Ear canal normal. No mastoid tenderness. Tympanic membrane is erythematous. A middle ear effusion is present. No hemotympanum.  Nose: Mucosal edema and rhinorrhea present. Right sinus exhibits maxillary sinus tenderness. Left sinus exhibits maxillary sinus tenderness.  Mouth/Throat: Uvula is midline and mucous membranes are normal. No trismus in the jaw. No uvula swelling. Posterior oropharyngeal erythema present. No oropharyngeal exudate, posterior oropharyngeal edema or tonsillar abscesses.  Neck: Normal range of motion. Neck supple. No JVD present.  Cardiovascular: Normal rate, regular rhythm, normal heart sounds and intact distal pulses.   No murmur heard. Pulmonary/Chest: Effort normal. No respiratory distress. He has no wheezes. He has no rales.  He exhibits no tenderness.  Coarse lung sounds bilaterally that improve after cough.  No wheezing or rales  Abdominal: Soft. He exhibits no distension. There is no tenderness. There is no rebound and no guarding.   Musculoskeletal: Normal range of motion.  Lymphadenopathy:    He has no cervical adenopathy.  Neurological: He is alert and oriented to person, place, and time. He exhibits normal muscle tone. Coordination normal.  Skin: Skin is warm and dry. No rash noted.  Psychiatric: He has a normal mood and affect.  Nursing note and vitals reviewed.   ED Course  Procedures (including critical care time) Labs Review Labs Reviewed - No data to display  Imaging Review No results found.   EKG Interpretation None      MDM   Final diagnoses:  Cough  Medication refill    Pt is well appearing, non-toxic.  Cough, chest and nasal congestion for 2 weeks.  No tachycardia, hypoxia or tachypnea.  Patient requesting refill of his Diovan, has been out for 2 months.  Has appt with his PMD, Dr. Berdine Addison on Feb 22.    0125 Patient is feeling better after albuterol neb, inhaler dispensed for home use.  Remains stable.  Waiting for CXR results, end of shift.  Discussed pt with Dr. Rolland Porter who will review imaging and dispo patient.    Stephnie Parlier L. Kelani Robart, PA-C 09/18/14 0139  Janice Norrie, MD 09/18/14 [phone]

## 2014-09-18 NOTE — ED Notes (Signed)
Discharge instructions and prescriptions given and reviewed with patient.  Patient verbalized understanding to take medications as directed and to follow up as needed.  Patient ambulatory; discharged home in good condition.

## 2016-02-01 ENCOUNTER — Ambulatory Visit (INDEPENDENT_AMBULATORY_CARE_PROVIDER_SITE_OTHER): Payer: 59 | Admitting: Psychiatry

## 2016-02-01 DIAGNOSIS — F411 Generalized anxiety disorder: Secondary | ICD-10-CM | POA: Diagnosis not present

## 2016-02-10 ENCOUNTER — Ambulatory Visit: Payer: 59 | Admitting: Psychiatry

## 2016-03-08 ENCOUNTER — Ambulatory Visit (HOSPITAL_COMMUNITY): Payer: Self-pay | Admitting: Psychology

## 2016-03-18 ENCOUNTER — Encounter (HOSPITAL_COMMUNITY): Payer: Self-pay | Admitting: Emergency Medicine

## 2016-03-18 ENCOUNTER — Other Ambulatory Visit: Payer: Self-pay | Admitting: Family Medicine

## 2016-03-18 ENCOUNTER — Ambulatory Visit (HOSPITAL_BASED_OUTPATIENT_CLINIC_OR_DEPARTMENT_OTHER)
Admission: RE | Admit: 2016-03-18 | Discharge: 2016-03-18 | Disposition: A | Discharge status: Home / Self Care | Payer: 59 | Source: Ambulatory Visit | Attending: Family Medicine | Admitting: Family Medicine

## 2016-03-18 ENCOUNTER — Emergency Department (HOSPITAL_COMMUNITY)
Admission: EM | Admit: 2016-03-18 | Discharge: 2016-03-18 | Disposition: A | Discharge status: Home / Self Care | Payer: 59 | Attending: Emergency Medicine | Admitting: Emergency Medicine

## 2016-03-18 ENCOUNTER — Ambulatory Visit (INDEPENDENT_AMBULATORY_CARE_PROVIDER_SITE_OTHER): Payer: 59 | Admitting: Family Medicine

## 2016-03-18 ENCOUNTER — Emergency Department (HOSPITAL_COMMUNITY): Payer: 59

## 2016-03-18 VITALS — BP 130/74 | HR 80 | Temp 98.4°F | Resp 14 | Ht 74.0 in | Wt 309.0 lb

## 2016-03-18 DIAGNOSIS — R1011 Right upper quadrant pain: Secondary | ICD-10-CM | POA: Diagnosis not present

## 2016-03-18 DIAGNOSIS — I10 Essential (primary) hypertension: Secondary | ICD-10-CM | POA: Insufficient documentation

## 2016-03-18 DIAGNOSIS — R932 Abnormal findings on diagnostic imaging of liver and biliary tract: Secondary | ICD-10-CM | POA: Insufficient documentation

## 2016-03-18 DIAGNOSIS — D1803 Hemangioma of intra-abdominal structures: Secondary | ICD-10-CM | POA: Diagnosis not present

## 2016-03-18 DIAGNOSIS — R81 Glycosuria: Secondary | ICD-10-CM

## 2016-03-18 DIAGNOSIS — Z7982 Long term (current) use of aspirin: Secondary | ICD-10-CM | POA: Diagnosis not present

## 2016-03-18 DIAGNOSIS — R739 Hyperglycemia, unspecified: Secondary | ICD-10-CM

## 2016-03-18 DIAGNOSIS — R110 Nausea: Secondary | ICD-10-CM | POA: Diagnosis not present

## 2016-03-18 DIAGNOSIS — Z79899 Other long term (current) drug therapy: Secondary | ICD-10-CM | POA: Insufficient documentation

## 2016-03-18 DIAGNOSIS — F17200 Nicotine dependence, unspecified, uncomplicated: Secondary | ICD-10-CM | POA: Insufficient documentation

## 2016-03-18 DIAGNOSIS — R229 Localized swelling, mass and lump, unspecified: Secondary | ICD-10-CM

## 2016-03-18 DIAGNOSIS — IMO0002 Reserved for concepts with insufficient information to code with codable children: Secondary | ICD-10-CM

## 2016-03-18 LAB — POCT CBC
Granulocyte percent: 67.8 %G (ref 37–80)
HCT, POC: 38.8 % — AB (ref 43.5–53.7)
HEMOGLOBIN: 12.7 g/dL — AB (ref 14.1–18.1)
LYMPH, POC: 2.5 (ref 0.6–3.4)
MCH: 23.8 pg — AB (ref 27–31.2)
MCHC: 32.7 g/dL (ref 31.8–35.4)
MCV: 72.6 fL — AB (ref 80–97)
MID (CBC): 0.7 (ref 0–0.9)
MPV: 8.4 fL (ref 0–99.8)
PLATELET COUNT, POC: 247 10*3/uL (ref 142–424)
POC Granulocyte: 6.8 (ref 2–6.9)
POC LYMPH PERCENT: 25 %L (ref 10–50)
POC MID %: 7.2 %M (ref 0–12)
RBC: 5.35 M/uL (ref 4.69–6.13)
RDW, POC: 13.5 %
WBC: 10.1 10*3/uL (ref 4.6–10.2)

## 2016-03-18 LAB — POCT URINALYSIS DIP (MANUAL ENTRY)
Bilirubin, UA: NEGATIVE
Ketones, POC UA: NEGATIVE
LEUKOCYTES UA: NEGATIVE
NITRITE UA: NEGATIVE
PH UA: 7
Protein Ur, POC: NEGATIVE
RBC UA: NEGATIVE
Spec Grav, UA: 1.015
UROBILINOGEN UA: 1

## 2016-03-18 LAB — COMPLETE METABOLIC PANEL WITH GFR
ALBUMIN: 4.4 g/dL (ref 3.6–5.1)
ALK PHOS: 91 U/L (ref 40–115)
ALT: 24 U/L (ref 9–46)
AST: 13 U/L (ref 10–40)
BILIRUBIN TOTAL: 0.5 mg/dL (ref 0.2–1.2)
BUN: 11 mg/dL (ref 7–25)
CO2: 26 mmol/L (ref 20–31)
Calcium: 9.5 mg/dL (ref 8.6–10.3)
Chloride: 100 mmol/L (ref 98–110)
Creat: 0.72 mg/dL (ref 0.60–1.35)
Glucose, Bld: 328 mg/dL — ABNORMAL HIGH (ref 65–99)
Potassium: 4.5 mmol/L (ref 3.5–5.3)
Sodium: 135 mmol/L (ref 135–146)
TOTAL PROTEIN: 7.1 g/dL (ref 6.1–8.1)

## 2016-03-18 LAB — GLUCOSE, POCT (MANUAL RESULT ENTRY): POC GLUCOSE: 344 mg/dL — AB (ref 70–99)

## 2016-03-18 LAB — POC MICROSCOPIC URINALYSIS (UMFC): Mucus: ABSENT

## 2016-03-18 MED ORDER — TRAMADOL HCL 50 MG PO TABS: 50.0000 mg | ORAL_TABLET | Freq: Four times a day (QID) | ORAL | 0 refills | Status: DC | PRN

## 2016-03-18 MED ORDER — GLIMEPIRIDE 2 MG PO TABS: 2.0000 mg | ORAL_TABLET | Freq: Every day | ORAL | 0 refills | Status: DC

## 2016-03-18 MED ORDER — IOPAMIDOL (ISOVUE-300) INJECTION 61%
100.0000 mL | Freq: Once | INTRAVENOUS | Status: AC | PRN
  Administered 2016-03-18: 100 mL via INTRAVENOUS

## 2016-03-18 NOTE — ED Triage Notes (Signed)
Pt complaint of right abdominal pain for 3 days with associated nausea. Recent US revealed mass on liver; sent here for further evaluation and CT.

## 2016-03-18 NOTE — Progress Notes (Addendum)
By signing my name below I, Tereasa Coop, attest that this documentation has been prepared under the direction and in the presence of Wendie Agreste, MD. Electonically Signed. Tereasa Coop, Scribe 03/18/2016 at 2:24 PM   Subjective:    Patient ID: [name] Gregory, male    DOB: 01/23/1971, 45 y.o.   MRN: [id number]  Chief Complaint  Patient presents with   Back Pain    rioght side pain three days    HPI [name] Gregory is a 45 y.o. male who presents to the [facility] complaining of rt sided back pain for the past 3 days. Pain has been worsening and became unbearable yesterday. Pt is improved today. Pain has been radiating into rt abd. Pt also reports distension in rt flank. Pt also reports having a mild "stinging/tingling pain" in his rt flank for the past few weeks. Pt denies N/V/D in the past 3 days. Pt has had constipation in the past 3 weeks. Pt reports small, frequent, and loose stool in the past 3 weeks. Pt denies any blisters, rash, fever, or chills.   Pt has had nausea for several months.  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 Pain today is different than his chronic pain.  Pt still has gallbladder and appendix.   Pt's wife just gave birth and pt has been staying at the hospital this past few days.   Pt has history of DM and was on janumet until 2 weeks ago when he was changed to just metformin. Taking 500mg  BID. Not checking home readings lately. Has been drinking water, no recent blurry vision.   There are no active problems to display for this patient.  Past Medical History:  Diagnosis Date   Hypertension    Past Surgical History:  Procedure Laterality Date   EYE SURGERY     No Known Allergies Prior to Admission medications   Medication Sig Start Date End Date Taking? Authorizing Provider  amlodipine-atorvastatin (CADUET) 10-10 MG tablet Take 1 tablet by mouth daily.   Yes Historical Provider, MD  buPROPion (ZYBAN) 150  MG 12 hr tablet Take 150 mg by mouth 2 (two) times daily.   Yes Historical Provider, MD  carvedilol (COREG) 25 MG tablet Take 25 mg by mouth 2 (two) times daily with a meal.   Yes Historical Provider, MD  LORazepam (ATIVAN) 0.5 MG tablet Take 0.5 mg by mouth every 8 (eight) hours.   Yes Historical Provider, MD  traMADol (ULTRAM) 50 MG tablet Take by mouth every 6 (six) hours as needed.   Yes Historical Provider, MD  valsartan-hydrochlorothiazide (DIOVAN HCT) 320-25 MG per tablet Take 1 tablet by mouth daily. 09/18/14  Yes Kem Parkinson, PA-C   Social History   Social History   Marital status: Married    Spouse name: N/A   Number of children: N/A   Years of education: N/A   Occupational History   Not on file.   Social History Main Topics   Smoking status: Current Every Day Smoker   Smokeless tobacco: Not on file   Alcohol use No   Drug use: No   Sexual activity: Not on file   Other Topics Concern   Not on file   Social History Narrative   No narrative on file      Review of Systems  Constitutional: Negative for chills and fever.  Gastrointestinal: Positive for abdominal pain, constipation and nausea (intermittent for the past 3 weeks,  resolved currently). Negative for vomiting.  Musculoskeletal: Positive for back pain (rt sided).  Skin: Negative for rash.       Objective:   Physical Exam  Constitutional: He is oriented to person, place, and time. He appears well-developed and well-nourished.  HENT:  Head: Normocephalic and atraumatic.  Eyes: EOM are normal. Pupils are equal, round, and reactive to light.  Neck: No JVD present. Carotid bruit is not present.  Cardiovascular: Normal rate, regular rhythm and normal heart sounds.   No murmur heard. Pulmonary/Chest: Effort normal and breath sounds normal. He has no rales.  Abdominal: There is tenderness (RUQ>epigastric) in the right upper quadrant and epigastric area. There is positive Murphy's sign. There is no  CVA tenderness.  Rt lateral abd tenderness.  Musculoskeletal: Normal range of motion. He exhibits no edema.       Cervical back: He exhibits no tenderness and no bony tenderness.       Thoracic back: He exhibits no tenderness and no bony tenderness.       Lumbar back: He exhibits no tenderness and no bony tenderness.  Neurological: He is alert and oriented to person, place, and time.  Skin: Skin is warm and dry. No rash noted.  No burning no tingling no rash over abd, flanks, or back.  Psychiatric: He has a normal mood and affect.  Vitals reviewed.    Vitals:   03/18/16 1315  BP: 130/74  Pulse: 80  Resp: 14  Temp: 98.4 F (36.9 C)  SpO2: 98%  Weight: (!) 309 lb (140.2 kg)  Height: 6\' 2"  (1.88 m)  Body mass index is 39.67 kg/m.  Results for orders placed or performed in visit on 03/18/16  POCT CBC  Result Value Ref Range   WBC 10.1 4.6 - 10.2 K/uL   Lymph, poc 2.5 0.6 - 3.4   POC LYMPH PERCENT 25.0 10 - 50 %L   MID (cbc) 0.7 0 - 0.9   POC MID % 7.2 0 - 12 %M   POC Granulocyte 6.8 2 - 6.9   Granulocyte percent 67.8 37 - 80 %G   RBC 5.35 4.69 - 6.13 M/uL   Hemoglobin 12.7 (A) 14.1 - 18.1 g/dL   HCT, POC 38.8 (A) 43.5 - 53.7 %   MCV 72.6 (A) 80 - 97 fL   MCH, POC 23.8 (A) 27 - 31.2 pg   MCHC 32.7 31.8 - 35.4 g/dL   RDW, POC 13.5 %   Platelet Count, POC 247 142 - 424 K/uL   MPV 8.4 0 - 99.8 fL  POCT urinalysis dipstick  Result Value Ref Range   Color, UA yellow yellow   Clarity, UA clear clear   Glucose, UA =500 (A) negative   Bilirubin, UA negative negative   Ketones, POC UA negative negative   Spec Grav, UA 1.015    Blood, UA negative negative   pH, UA 7.0    Protein Ur, POC negative negative   Urobilinogen, UA 1.0    Nitrite, UA Negative Negative   Leukocytes, UA Negative Negative  POCT Microscopic Urinalysis ([facility])  Result Value Ref Range   WBC,UR,HPF,POC None None WBC/hpf   RBC,UR,HPF,POC None None RBC/hpf   Bacteria None None, Too numerous to count    Mucus Absent Absent   Epithelial Cells, UR Per Microscopy Few (A) None, Too numerous to count cells/hpf  POCT glucose (manual entry)  Result Value Ref Range   POC Glucose 344 (A) 70 - 99 mg/dl  Assessment & Plan:    [name] Gregory is a 45 y.o. male RUQ abdominal pain - Plan: POCT CBC, POCT urinalysis dipstick, POCT Microscopic Urinalysis ([facility]), COMPLETE METABOLIC PANEL WITH GFR, US Abdomen Complete Nausea - Plan: POCT CBC, POCT urinalysis dipstick, POCT Microscopic Urinalysis ([facility]), COMPLETE METABOLIC PANEL WITH GFR, US Abdomen Complete  - Suspicious for cholecystitis. Afebrile. Borderline WBC, but worsening pain today. Will check ultrasound of abdomen, then depending on results we'll discuss with general surgeon. For now clear fluids only after gallbladder ultrasound.  -Ultram temperature she given if needed for breakthrough pain as long as ultrasound does not indicate acute concern today.  Glycosuria - Plan: POCT glucose (manual entry) Hyperglycemia - Plan: US Abdomen Complete, glimepiride (AMARYL) 2 MG tablet  uncontrolled diabetes. Advised to discuss with his PCP on Monday. No ketones in urine, doubt DKA/hyperosmolar state. CMP pending. Add Amaryl 2 mg daily, continue metformin same dose as some diarrhea/loose stools, hesitant to increase this dose.   4:36 PM  CT report received:  IMPRESSION: 3.1 x 2.3 x 3.4 cm complex masslike region in the right hepatic lobe near the porta hepatis with internal blood flow. This is a nonspecific finding. Both a neoplastic process and an abscess are possible. While an MRI would probably best characterize this abnormality, in the acute setting, I would recommend starting with a CT scan for further characterization.  Results discussed with patient. We'll have him proceed to [facility] ER for further evaluation as may need CT, and panel results possible antibiotics versus outpatient evaluation with MRI. Charge nurse  advised.      Meds ordered this encounter  Medications   traMADol (ULTRAM) 50 MG tablet    Sig: Take 50 mg by mouth every 6 (six) hours as needed.    amlodipine-atorvastatin (CADUET) 10-10 MG tablet    Sig: Take 1 tablet by mouth daily.   LORazepam (ATIVAN) 0.5 MG tablet    Sig: Take 0.5 mg by mouth daily as needed.    carvedilol (COREG) 25 MG tablet    Sig: Take 25 mg by mouth 2 (two) times daily with a meal.   buPROPion (ZYBAN) 150 MG 12 hr tablet    Sig: Take 150 mg by mouth 2 (two) times daily.   metFORMIN (GLUCOPHAGE) 500 MG tablet    Sig: Take 500 mg by mouth 2 (two) times daily with a meal.   aspirin 81 MG tablet    Sig: Take 81 mg by mouth daily.   gabapentin (NEURONTIN) 300 MG capsule    Sig: Take 300 mg by mouth 4 (four) times daily -  before meals and at bedtime. Take one capsule by mouth in the morning and two at bedtime for nerve pain   acetaminophen (TYLENOL) 500 MG tablet    Sig: Take 500 mg by mouth every 6 (six) hours as needed.   glimepiride (AMARYL) 2 MG tablet    Sig: Take 1 tablet (2 mg total) by mouth daily before breakfast.    Dispense:  30 tablet    Refill:  0   Patient Instructions    I will check an ultrasound today to look at the gallbladder as this may be causing your pain. For now, clear fluids only after gallbladder ultrasound has been performed. Depending on those results, may need to speak with the general surgeon about timing of evaluation with the surgeon.  Diabetes was uncontrolled today with blood sugar 344. Continue metformin twice per day, start Amaryl once per day with  first meal and call your primary care provider Monday to discuss further plans.  Return to the clinic or go to the nearest emergency room if any of your symptoms worsen or new symptoms occur.    IF you received an x-ray today, you will receive an invoice from [facility]. Please contact [facility] at [phone] with questions or concerns  regarding your invoice.   IF you received labwork today, you will receive an invoice from [employer]. Please contact [facility] at [phone] with questions or concerns regarding your invoice.   Our billing staff will not be able to assist you with questions regarding bills from these companies.  You will be contacted with the lab results as soon as they are available. The fastest way to get your results is to activate your My Chart account. Instructions are located on the last page of this paperwork. If you have not heard from [place] regarding the results in 2 weeks, please contact this office.     Abdominal Pain, Adult Many things can cause abdominal pain. Usually, abdominal pain is not caused by a disease and will improve without treatment. It can often be observed and treated at home. Your health care provider will do a physical exam and possibly order blood tests and X-rays to help determine the seriousness of your pain. However, in many cases, more time must pass before a clear cause of the pain can be found. Before that point, your health care provider may not know if you need more testing or further treatment. HOME CARE INSTRUCTIONS Monitor your abdominal pain for any changes. The following actions may help to alleviate any discomfort you are experiencing:  Only take over-the-counter or prescription medicines as directed by your health care provider.  Do not take laxatives unless directed to do so by your health care provider.  Try a clear liquid diet (broth, tea, or water) as directed by your health care provider. Slowly move to a bland diet as tolerated. SEEK MEDICAL CARE IF:  You have unexplained abdominal pain.  You have abdominal pain associated with nausea or diarrhea.  You have pain when you urinate or have a bowel movement.  You experience abdominal pain that wakes you in the night.  You have abdominal pain that is worsened or improved by eating  food.  You have abdominal pain that is worsened with eating fatty foods.  You have a fever. SEEK IMMEDIATE MEDICAL CARE IF:  Your pain does not go away within 2 hours.  You keep throwing up (vomiting).  Your pain is felt only in portions of the abdomen, such as the right side or the left lower portion of the abdomen.  You pass bloody or black tarry stools. MAKE SURE YOU:  Understand these instructions.  Will watch your condition.  Will get help right away if you are not doing well or get worse.   This information is not intended to replace advice given to you by your health care provider. Make sure you discuss any questions you have with your health care provider.   Document Released: 05/10/2005 Document Revised: 04/21/2015 Document Reviewed: 04/09/2013 Elsevier Interactive Patient Education [employer].     I personally performed the services described in this documentation, which was scribed in my presence. The recorded information has been reviewed and considered, and addended by me as needed.   Signed,   Merri Ray, MD [facility].  03/18/16 2:40 PM

## 2016-03-18 NOTE — ED Notes (Signed)
No respiratory or acute distress noted alert and oriented x 3 call light in reach. 

## 2016-03-18 NOTE — Patient Instructions (Addendum)
I will check an ultrasound today to look at the gallbladder as this may be causing your pain. For now, clear fluids only after gallbladder ultrasound has been performed. Depending on those results, may need to speak with the general surgeon about timing of evaluation with the surgeon.  Diabetes was uncontrolled today with blood sugar 344. Continue metformin twice per day, start Amaryl once per day with first meal and call your primary care provider Monday to discuss further plans.  Return to the clinic or go to the nearest emergency room if any of your symptoms worsen or new symptoms occur.    IF you received an x-ray today, you will receive an invoice from [facility]. Please contact [facility] at [phone] with questions or concerns regarding your invoice.   IF you received labwork today, you will receive an invoice from [employer]. Please contact [facility] at [phone] with questions or concerns regarding your invoice.   Our billing staff will not be able to assist you with questions regarding bills from these companies.  You will be contacted with the lab results as soon as they are available. The fastest way to get your results is to activate your My Chart account. Instructions are located on the last page of this paperwork. If you have not heard from [place] regarding the results in 2 weeks, please contact this office.     Abdominal Pain, Adult Many things can cause abdominal pain. Usually, abdominal pain is not caused by a disease and will improve without treatment. It can often be observed and treated at home. Your health care provider will do a physical exam and possibly order blood tests and X-rays to help determine the seriousness of your pain. However, in many cases, more time must pass before a clear cause of the pain can be found. Before that point, your health care provider may not know if you need more testing or further  treatment. HOME CARE INSTRUCTIONS Monitor your abdominal pain for any changes. The following actions may help to alleviate any discomfort you are experiencing:  Only take over-the-counter or prescription medicines as directed by your health care provider.  Do not take laxatives unless directed to do so by your health care provider.  Try a clear liquid diet (broth, tea, or water) as directed by your health care provider. Slowly move to a bland diet as tolerated. SEEK MEDICAL CARE IF:  You have unexplained abdominal pain.  You have abdominal pain associated with nausea or diarrhea.  You have pain when you urinate or have a bowel movement.  You experience abdominal pain that wakes you in the night.  You have abdominal pain that is worsened or improved by eating food.  You have abdominal pain that is worsened with eating fatty foods.  You have a fever. SEEK IMMEDIATE MEDICAL CARE IF:  Your pain does not go away within 2 hours.  You keep throwing up (vomiting).  Your pain is felt only in portions of the abdomen, such as the right side or the left lower portion of the abdomen.  You pass bloody or black tarry stools. MAKE SURE YOU:  Understand these instructions.  Will watch your condition.  Will get help right away if you are not doing well or get worse.   This information is not intended to replace advice given to you by your health care provider. Make sure you discuss any questions you have with your health care provider.   Document Released: 05/10/2005 Document  Revised: 04/21/2015 Document Reviewed: 04/09/2013 Elsevier Interactive Patient Education [employer].

## 2016-03-18 NOTE — ED Provider Notes (Signed)
[facility] DEPT Provider Note   CSN: [id number] Arrival date & time: 03/18/16  1722  First Provider Contact:  None       History   Chief Complaint Chief Complaint  Patient presents with  . Abdominal Pain    HPI [name] Gregory is a 45 y.o. male.  HPI   Pain building up right flank 3 days ago, as the night went on last night and began to get tender and felt like something was about to pop. Pressure, aching.  Called urgent care and went there to get it checked out.  Had been having nausea in AM for a while, worse in the last week.  No vomiting. No fevers/chills. A little bit of weight loss.   3/10 pain now, standing is worse, builds then becomes mores severe. Breathing makes it worse, sneezing makes it worse.   Past Medical History:  Diagnosis Date  . Hypertension     There are no active problems to display for this patient.   Past Surgical History:  Procedure Laterality Date  . EYE SURGERY         Home Medications    Prior to Admission medications   Medication Sig Start Date End Date Taking? Authorizing Provider  acetaminophen (TYLENOL) 500 MG tablet Take 500 mg by mouth every 6 (six) hours as needed.    Historical Provider, MD  amLODipine (NORVASC) 10 MG tablet Take 10 mg by mouth daily. 03/11/16   Historical Provider, MD  aspirin 81 MG tablet Take 81 mg by mouth daily.    Historical Provider, MD  buPROPion (ZYBAN) 150 MG 12 hr tablet Take 150 mg by mouth 2 (two) times daily.    Historical Provider, MD  carvedilol (COREG) 25 MG tablet Take 25 mg by mouth 2 (two) times daily with a meal.    Historical Provider, MD  gabapentin (NEURONTIN) 300 MG capsule Take 300 mg by mouth 2 (two) times daily. Take one capsule by mouth in the morning and two at bedtime for nerve pain     Historical Provider, MD  glimepiride (AMARYL) 2 MG tablet Take 1 tablet (2 mg total) by mouth daily before breakfast. 03/18/16   Wendie Agreste, MD  LORazepam (ATIVAN) 0.5 MG tablet Take 0.5 mg by  mouth every 8 (eight) hours as needed for anxiety.     Historical Provider, MD  metFORMIN (GLUCOPHAGE) 500 MG tablet Take 500 mg by mouth 2 (two) times daily with a meal.    Historical Provider, MD  traMADol (ULTRAM) 50 MG tablet Take 1 tablet (50 mg total) by mouth every 6 (six) hours as needed. Patient taking differently: Take 50 mg by mouth every 6 (six) hours as needed for moderate pain.  03/18/16   Wendie Agreste, MD  valsartan-hydrochlorothiazide (DIOVAN HCT) 320-25 MG per tablet Take 1 tablet by mouth daily. 09/18/14   Kem Parkinson, PA-C    Family History No family history on file.  Social History Social History  Substance Use Topics  . Smoking status: Current Every Day Smoker  . Smokeless tobacco: Not on file  . Alcohol use No     Allergies   Review of patient's allergies indicates no known allergies.   Review of Systems Review of Systems  Constitutional: Negative for fever.  HENT: Negative for sore throat.   Eyes: Negative for visual disturbance.  Respiratory: Negative for shortness of breath.   Cardiovascular: Negative for chest pain.  Gastrointestinal: Positive for abdominal pain and nausea. Negative for constipation,  diarrhea (need to frequently have BM, but not large stool, soft stool) and vomiting.  Genitourinary: Positive for flank pain. Negative for difficulty urinating.  Musculoskeletal: Negative for back pain and neck stiffness.  Skin: Negative for rash.  Neurological: Negative for syncope and headaches.     Physical Exam Updated Vital Signs BP 127/69 (BP Location: Left Arm)   Pulse 79   Temp 98.1 F (36.7 C) (Oral)   Resp 18   SpO2 99%   Physical Exam  Constitutional: He is oriented to person, place, and time. He appears well-developed and well-nourished. No distress.  HENT:  Head: Normocephalic and atraumatic.  Eyes: Conjunctivae and EOM are normal.  Neck: Normal range of motion.  Cardiovascular: Normal rate, regular rhythm, normal heart sounds  and intact distal pulses.  Exam reveals no gallop and no friction rub.   No murmur heard. Pulmonary/Chest: Effort normal and breath sounds normal. No respiratory distress. He has no wheezes. He has no rales.  Abdominal: Soft. He exhibits no distension. There is tenderness (RUQ). There is no guarding and no CVA tenderness.  Musculoskeletal: He exhibits no edema.  Neurological: He is alert and oriented to person, place, and time.  Skin: Skin is warm and dry. He is not diaphoretic.  Nursing note and vitals reviewed.    ED Treatments / Results  Labs (all labs ordered are listed, but only abnormal results are displayed) Labs Reviewed - No data to display  EKG  EKG Interpretation None       Radiology Ct Abdomen Pelvis W Wo Contrast  Result Date: 03/18/2016 CLINICAL DATA:  45 year old male with history of right-sided abdominal pain for the past 3 days with some associated nausea. Mass in the liver noted on recent ultrasound examination. EXAM: CT ABDOMEN AND PELVIS WITHOUT AND WITH CONTRAST TECHNIQUE: Multidetector CT imaging of the abdomen and pelvis was performed following the standard protocol before and following the bolus administration of intravenous contrast. CONTRAST:  175mL ISOVUE-300 IOPAMIDOL (ISOVUE-300) INJECTION 61% COMPARISON:  No prior.  Abdominal ultrasound 03/18/2016. FINDINGS: Lower chest:  Unremarkable. Hepatobiliary: The area of concern is located predominantly in segment 5 of the liver where there is a 3.9 x 2.7 cm intermediate attenuation lesion which demonstrates heterogeneous arterial phase enhancement, normalizing to the remainder of the liver parenchyma on portal venous phase and delayed phase imaging, favored to reflect a flash fill cavernous hemangioma. Findings are not compatible with an abscess. There is also a sub cm hypervascular area in the periphery of segment 4A (image 24 of series 3), which corresponds to a intermediate attenuation area on precontrast imaging  (image 14 of series 2), and normalizes to the remainder of the liver parenchyma on delayed phase imaging, also favored to represent a small flash fill cavernous hemangioma. No other suspicious hepatic lesions are noted. No intra or extrahepatic biliary ductal dilatation. Gallbladder is normal in appearance. Pancreas: No pancreatic mass. No pancreatic ductal dilatation. No pancreatic or peripancreatic fluid or inflammatory changes. Spleen: Unremarkable. Adrenals/Urinary Tract: Bilateral adrenal glands are normal in appearance. Sub cm low-attenuation lesions in the kidneys bilaterally are too small to definitively characterize, but are favored to represent tiny cysts. No hydroureteronephrosis. Urinary bladder is normal in appearance. Stomach/Bowel: The appearance of the stomach is normal. There is no pathologic dilatation of small bowel or colon. Normal appendix. Vascular/Lymphatic: Atherosclerosis in the pelvic vasculature, without evidence of aneurysm or dissection in the abdomen or pelvis. No lymphadenopathy noted in the abdomen or pelvis. Other: No significant volume of ascites.  No pneumoperitoneum. Musculoskeletal: There are no aggressive appearing lytic or blastic lesions noted in the visualized portions of the skeleton. IMPRESSION: 1. The area of concern in segment 5 of the liver has imaging characteristics compatible with a flash fill cavernous hemangioma. There is a second smaller flash fill cavernous hemangioma also noted in the periphery of segment 4A. No suspicious hepatic lesions are noted. 2. No acute abnormalities in the abdomen or pelvis to account for the patient's symptoms. 3. Normal appendix. 4. Mild atherosclerosis. Electronically Signed   By: Vinnie Langton M.D.   On: 03/18/2016 19:22   US Abdomen Limited Ruq  Result Date: 03/18/2016 CLINICAL DATA:  Right upper quadrant pain after meals for 3 days. Intermittent nausea. EXAM: US ABDOMEN LIMITED - RIGHT UPPER QUADRANT COMPARISON:  None.  FINDINGS: Gallbladder: No gallstones or wall thickening visualized. No sonographic Murphy sign noted by sonographer. Common bile duct: Diameter: 2.1 mm Liver: There is a complex mass, possibly containing cystic components, in the liver near the porta hepatis measuring 3.1 x 2.3 x 3.4 cm. There is blood flow in this region and the patient was very tender during scanning of this region. IMPRESSION: 3.1 x 2.3 x 3.4 cm complex masslike region in the right hepatic lobe near the porta hepatis with internal blood flow. This is a nonspecific finding. Both a neoplastic process and an abscess are possible. While an MRI would probably best characterize this abnormality, in the acute setting, I would recommend starting with a CT scan for further characterization. Electronically Signed   By: Dorise Bullion III M.D   On: 03/18/2016 16:32    Procedures Procedures (including critical care time)  Medications Ordered in ED Medications  iopamidol (ISOVUE-300) 61 % injection 100 mL (100 mLs Intravenous Contrast Given 03/18/16 1854)     Initial Impression / Assessment and Plan / ED Course  I have reviewed the triage vital signs and the nursing notes.  Pertinent labs & imaging results that were available during my care of the patient were reviewed by me and considered in my medical decision making (see chart for details).  Clinical Course    45yo male with history of htn presents from urgent care with concern for right upper quadrant pain and hepatic abnormality on [place]. CT abdomen shows flash filling hepatic angioma, discussing with Radiologist, he feels it has benign appearance. I feel given flash filling described, outpatient MRI for further characterization may be appropriate and should be discussed with PCP.  Patient with atypical appearing nevus on back and will refer to dermatology.  Unclear if hemangioma is etiology of pain. No sign of bleeding hemangioma. No sign cholecystitis or other etiology of pain. Labs  done at PCP without significant abnormalities.  Possible muscular pain or pain secondary to hemangioma. Patient discharged in stable condition with understanding of reasons to return.   Final Clinical Impressions(s) / ED Diagnoses   Final diagnoses:  Hepatic hemangioma  Right upper quadrant pain    New Prescriptions Discharge Medication List as of 03/18/2016  8:02 PM       Gareth Morgan, MD 03/19/16 1212

## 2016-03-20 ENCOUNTER — Encounter (HOSPITAL_COMMUNITY): Payer: Self-pay

## 2016-03-20 ENCOUNTER — Ambulatory Visit (HOSPITAL_COMMUNITY): Payer: Self-pay | Admitting: Psychiatry

## 2016-03-21 ENCOUNTER — Other Ambulatory Visit: Payer: Self-pay | Admitting: Internal Medicine

## 2016-03-21 DIAGNOSIS — R1011 Right upper quadrant pain: Secondary | ICD-10-CM

## 2016-03-21 DIAGNOSIS — R109 Unspecified abdominal pain: Secondary | ICD-10-CM

## 2016-03-25 ENCOUNTER — Ambulatory Visit (HOSPITAL_COMMUNITY)
Admission: RE | Admit: 2016-03-25 | Discharge: 2016-03-25 | Disposition: A | Discharge status: Home / Self Care | Payer: 59 | Source: Ambulatory Visit | Attending: Internal Medicine | Admitting: Internal Medicine

## 2016-03-25 DIAGNOSIS — N62 Hypertrophy of breast: Secondary | ICD-10-CM | POA: Diagnosis not present

## 2016-03-25 DIAGNOSIS — R1011 Right upper quadrant pain: Secondary | ICD-10-CM | POA: Insufficient documentation

## 2016-03-25 DIAGNOSIS — R109 Unspecified abdominal pain: Secondary | ICD-10-CM | POA: Diagnosis not present

## 2016-03-25 DIAGNOSIS — N281 Cyst of kidney, acquired: Secondary | ICD-10-CM | POA: Diagnosis not present

## 2016-03-25 DIAGNOSIS — D1803 Hemangioma of intra-abdominal structures: Secondary | ICD-10-CM | POA: Diagnosis not present

## 2016-03-25 DIAGNOSIS — R161 Splenomegaly, not elsewhere classified: Secondary | ICD-10-CM | POA: Insufficient documentation

## 2016-03-25 MED ORDER — GADOBENATE DIMEGLUMINE 529 MG/ML IV SOLN
20.0000 mL | Freq: Once | INTRAVENOUS | Status: AC | PRN
  Administered 2016-03-25: 20 mL via INTRAVENOUS

## 2016-04-03 ENCOUNTER — Encounter: Payer: Self-pay | Admitting: Internal Medicine

## 2016-04-24 ENCOUNTER — Encounter: Payer: Self-pay | Admitting: Gastroenterology

## 2016-04-24 ENCOUNTER — Other Ambulatory Visit: Payer: Self-pay

## 2016-04-24 ENCOUNTER — Ambulatory Visit (INDEPENDENT_AMBULATORY_CARE_PROVIDER_SITE_OTHER): Payer: 59 | Admitting: Gastroenterology

## 2016-04-24 VITALS — BP 133/84 | HR 86 | Temp 98.2°F | Ht 74.0 in | Wt 310.0 lb

## 2016-04-24 DIAGNOSIS — D509 Iron deficiency anemia, unspecified: Secondary | ICD-10-CM | POA: Diagnosis not present

## 2016-04-24 DIAGNOSIS — R1013 Epigastric pain: Secondary | ICD-10-CM

## 2016-04-24 DIAGNOSIS — R161 Splenomegaly, not elsewhere classified: Secondary | ICD-10-CM

## 2016-04-24 DIAGNOSIS — R197 Diarrhea, unspecified: Secondary | ICD-10-CM | POA: Diagnosis not present

## 2016-04-24 DIAGNOSIS — R1011 Right upper quadrant pain: Secondary | ICD-10-CM | POA: Insufficient documentation

## 2016-04-24 DIAGNOSIS — D563 Thalassemia minor: Secondary | ICD-10-CM | POA: Insufficient documentation

## 2016-04-24 DIAGNOSIS — R700 Elevated erythrocyte sedimentation rate: Secondary | ICD-10-CM

## 2016-04-24 LAB — CBC WITH DIFFERENTIAL/PLATELET
BASOS ABS: 0 {cells}/uL (ref 0–200)
Basophils Relative: 0 %
EOS ABS: 303 {cells}/uL (ref 15–500)
Eosinophils Relative: 3 %
HCT: 38.9 % (ref 38.5–50.0)
HEMOGLOBIN: 12.2 g/dL — AB (ref 13.2–17.1)
Lymphocytes Relative: 27 %
Lymphs Abs: 2727 cells/uL (ref 850–3900)
MCH: 23.3 pg — AB (ref 27.0–33.0)
MCHC: 31.4 g/dL — AB (ref 32.0–36.0)
MCV: 74.4 fL — AB (ref 80.0–100.0)
MONOS PCT: 6 %
MPV: 10.5 fL (ref 7.5–12.5)
Monocytes Absolute: 606 cells/uL (ref 200–950)
NEUTROS ABS: 6464 {cells}/uL (ref 1500–7800)
NEUTROS PCT: 64 %
PLATELETS: 302 10*3/uL (ref 140–400)
RBC: 5.23 MIL/uL (ref 4.20–5.80)
RDW: 14.4 % (ref 11.0–15.0)
WBC: 10.1 10*3/uL (ref 3.8–10.8)

## 2016-04-24 LAB — FERRITIN: Ferritin: 404 ng/mL — ABNORMAL HIGH (ref 20–380)

## 2016-04-24 LAB — IRON AND TIBC
%SAT: 19 % (ref 15–60)
Iron: 60 ug/dL (ref 50–180)
TIBC: 315 ug/dL (ref 250–425)
UIBC: 255 ug/dL (ref 125–400)

## 2016-04-24 MED ORDER — NA SULFATE-K SULFATE-MG SULF 17.5-3.13-1.6 GM/177ML PO SOLN: 1.0000 | ORAL | 0 refills | Status: DC

## 2016-04-24 NOTE — Assessment & Plan Note (Signed)
45 year old gentleman with acute on chronic GI symptoms recently. He has a several month history of upper abdominal pain, predominately epigastric/left upper quadrant, sometimes right lower quadrant. Intermittent pain typically sharp, lasting for a few minutes at a time. Also with chronic postprandial abdominal cramping and loose stool. Chronic nausea without vomiting or typical GERD. However back several weeks ago he had acute onset right upper quadrant pain, unrelated to meals, constant. Workup revealed 3.6 x 2.6 cm segment 5 right liver lobe mass consistent with cavernous hemangioma, smaller hemangioma in the segment 2 left liver dome. Questionable mild splenomegaly versus normal for body size/height. Patient with melena reported, mild microcytic anemia, mildly elevated sedimentation rate.  Multiple issues needing to be addressed. It is not clear that his abdominal pain is related to hemangiomas found. Given other issues of chronic nausea, anemia, reported melena, he needs to have an upper endoscopy. In addition he has some chronic postprandial diarrhea, lower abdominal pain, offered colonoscopy for further evaluation. We will recheck labs for progressive anemia, IDA, celiac disease.   EGD/TCS with Dr. Gala Romney in [place] (previous inadequate sedation per patient).  I have discussed the risks, alternatives, benefits with regards to but not limited to the risk of reaction to medication, bleeding, infection, perforation and the patient is agreeable to proceed. Written consent to be obtained.  Will review films, patient may require additional abd u/s in six month to check stability of hepatic hemangiomas and splenomegaly. Further recommendations to follow.

## 2016-04-24 NOTE — Progress Notes (Signed)
CC'ED TO PCP 

## 2016-04-24 NOTE — Patient Instructions (Signed)
1. Colonoscopy and upper endoscopy with Dr. Gala Romney. See separate instructions.  2. I will let you know next step for the liver hemangioma once films are reviewed. Likely will plan on ultrasound in 6 months for documentation of stability as well as to reevaluate mild splenomegaly. 3. Please have your labs done today or tomorrow.

## 2016-04-24 NOTE — Progress Notes (Signed)
Primary Care Physician:  Foye Spurling, MD  Primary Gastroenterologist:  Garfield Cornea, MD   Chief Complaint  Patient presents with  . Abdominal Pain    referred for epigastric pain    HPI:  [name] Gregory is a 45 y.o. male here for further evaluation of abdominal pain. Patient states he's been having intermittent abdominal pain for few months. Pain tends to for normally be in the upper abdomen sometimes right lower quadrant. Typically lasts for a few minutes at a time, severe and sharp when it occurs. However back on 03/17/2016 he had acute onset severe right upper quadrant pain worse than usual. Felt like he had something in the right upper quadrant which was going to pop. Felt a lot of pressure. Not really worse with meals. Nothing seemed to make it better. He went to urgent care. Abdominal ultrasound revealed complex appearing mass in the right hepatic lobe near the porta hepatis with internal blood flow. Measure 3.1 x 2.3 x 3.4 cm. CT scan was done for further evaluation of his pain in this liver lesion. In segment 5 of liver there was 3.9 x 2.7 cm indeterminate attenuation lesion demonstrating heterogeneous arterial phase enhancement, normalizing to the remainder of the liver parenchyma on portal venous phase and delayed phase imaging, favored to reflect a flash fill cavernous hemangioma. There was a subcentimeter hypervascular area in the periphery of segment 4A also suspected the small flash fill cavernous hemangioma. No suspicious hepatic lesion is noted. Spleen unremarkable. MRI of the abdomen was done which confirmed liver hemangiomas as described. Mildly enlarged spleen which was felt could be normal for this patient patient is above average in height. No abdominal lymphadenopathy seen. Spleen measured 13.7 cm in craniocaudal length.   Patient also has chronic postprandial abdominal cramping and diarrhea. No bright red blood per rectum. Complains of black stools the last time a couple of  weeks ago. He takes aspirin 81 mg daily but no other NSAIDs. Chronically wakes up in the morning feels nauseated. Occurs even if he consumes food with his medication. Sometimes returns later in the day. No vomiting. No heartburn. Sometimes belches and has a "rotten smell".  Patient's labs revealed mild anemia. Microcytosis noted. He also had elevated sedimentation rate.    Current Outpatient Prescriptions  Medication Sig Dispense Refill  . amLODipine (NORVASC) 10 MG tablet Take 10 mg by mouth daily.    Marland Kitchen aspirin 81 MG tablet Take 81 mg by mouth daily.    Marland Kitchen buPROPion (ZYBAN) 150 MG 12 hr tablet Take 150 mg by mouth 2 (two) times daily.    . carvedilol (COREG) 25 MG tablet Take 25 mg by mouth 2 (two) times daily with a meal.    . gabapentin (NEURONTIN) 300 MG capsule Take 300 mg by mouth 2 (two) times daily. Take one capsule by mouth in the morning and two at bedtime for nerve pain     . glimepiride (AMARYL) 2 MG tablet Take 1 tablet (2 mg total) by mouth daily before breakfast. 30 tablet 0  . LORazepam (ATIVAN) 0.5 MG tablet Take 0.5 mg by mouth every 8 (eight) hours as needed for anxiety.     . valsartan-hydrochlorothiazide (DIOVAN HCT) 320-25 MG per tablet Take 1 tablet by mouth daily. 30 tablet 0  . Vitamin D, Ergocalciferol, (DRISDOL) 50000 units CAPS capsule Take 1 capsule by mouth 3 (three) times a week.     No current facility-administered medications for this visit.     Allergies as of  04/24/2016  . (No Known Allergies)    Past Medical History:  Diagnosis Date  . Diabetes ([place])   . Hypertension   . Sleep apnea     Past Surgical History:  Procedure Laterality Date  . EYE SURGERY    . SALIVARY GLAND SURGERY  1992    Family History  Problem Relation Age of Onset  . Hypertension Mother   . CVA Father   . Diabetes Father   . Throat cancer Father   . Heart disease Brother 20  . Cervical cancer Maternal Grandmother   . Throat cancer Maternal Grandmother   . Throat  cancer Maternal Grandfather   . Bladder Cancer Maternal Grandfather   . Colon cancer Neg Hx     Social History   Social History  . Marital status: Married    Spouse name: N/A  . Number of children: N/A  . 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 Smoking status: Current Every Day Smoker  . Smokeless tobacco: Never Used  . Alcohol use No  . Drug use: No  . Sexual activity: Not on file   Other Topics Concern  . Not on file   Social History Narrative  . No narrative on file      ROS:  General: Negative for anorexia, weight loss, fever, chills, fatigue, weakness. Eyes: Negative for vision changes.  ENT: Negative for hoarseness, difficulty swallowing , nasal congestion. CV: Negative for chest pain, angina, palpitations, dyspnea on exertion, peripheral edema.  Respiratory: Negative for dyspnea at rest, dyspnea on exertion, cough, sputum, wheezing.  GI: See history of present illness. GU:  Negative for dysuria, hematuria, urinary incontinence, urinary frequency, nocturnal urination.  MS: Negative for joint pain, low back pain.  Derm: Negative for rash or itching.  Neuro: Negative for weakness, abnormal sensation, seizure, frequent headaches, memory loss, confusion.  Psych: Negative for anxiety, depression, suicidal ideation, hallucinations.  Endo: Negative for unusual weight change.  Heme: Negative for bruising or bleeding. Allergy: Negative for rash or hives.    Physical Examination:  BP 133/84   Pulse 86   Temp 98.2 F (36.8 C) (Oral)   Ht 6\' 2"  (1.88 m)   Wt (!) 310 lb (140.6 kg)   BMI 39.80 kg/m    General: Well-nourished, well-developed in no acute distress.  Head: Normocephalic, atraumatic.   Eyes: Conjunctiva pink, no icterus. Mouth: Oropharyngeal mucosa moist and pink , no lesions erythema or exudate. Neck: Supple without thyromegaly, masses, or lymphadenopathy.  Lungs: Clear to  auscultation bilaterally.  Heart: Regular rate and rhythm, no murmurs rubs or gallops.  Abdomen: Bowel sounds are normal, obese, moderate tenderness in the epigastrium/right upper quadrant, no hepatosplenomegaly or masses, no abdominal bruits or    hernia , no rebound or guarding.   Rectal: Deferred Extremities: No lower extremity edema. No clubbing or deformities.  Neuro: Alert and oriented x 4 , grossly normal neurologically.  Skin: Warm and dry, no rash or jaundice.   Psych: Alert and cooperative, normal mood and affect.  Labs: Labs from 03/21/2016 White blood cell count 9900, hemoglobin 12.5 slightly low, MCV 74 low, platelets 306,000, glucose 229, creatinine 0.59, BUN 9, sodium 136, potassium 4.4. Total bilirubin 0.5, alkaline phosphatase 95, AST 14, ALT 23, albumin 4.4, lipase 15, sedimentation rate 26 high  Imaging Studies: CLINICAL DATA:  Right upper quadrant pain after meals for 3 days. Intermittent nausea.  EXAM: US  ABDOMEN LIMITED - RIGHT UPPER QUADRANT  COMPARISON:  None.  FINDINGS: Gallbladder:  No gallstones or wall thickening visualized. No sonographic Murphy sign noted by sonographer.  Common bile duct:  Diameter: 2.1 mm  Liver:  There is a complex mass, possibly containing cystic components, in the liver near the porta hepatis measuring 3.1 x 2.3 x 3.4 cm. There is blood flow in this region and the patient was very tender during scanning of this region.  IMPRESSION: 3.1 x 2.3 x 3.4 cm complex masslike region in the right hepatic lobe near the porta hepatis with internal blood flow. This is a nonspecific finding. Both a neoplastic process and an abscess are possible. While an MRI would probably best characterize this abnormality, in the acute setting, I would recommend starting with a CT scan for further characterization.   Electronically Signed    By: Dorise Bullion III M.D   On: 03/18/2016 16:32  Study Result   CLINICAL DATA:   45 year old male with history of right-sided abdominal pain for the past 3 days with some associated nausea. Mass in the liver noted on recent ultrasound examination.  EXAM: CT ABDOMEN AND PELVIS WITHOUT AND WITH CONTRAST  TECHNIQUE: Multidetector CT imaging of the abdomen and pelvis was performed following the standard protocol before and following the bolus administration of intravenous contrast.  CONTRAST:  177mL ISOVUE-300 IOPAMIDOL (ISOVUE-300) INJECTION 61%  COMPARISON:  No prior.  Abdominal ultrasound 03/18/2016.  FINDINGS: Lower chest:  Unremarkable.  Hepatobiliary: The area of concern is located predominantly in segment 5 of the liver where there is a 3.9 x 2.7 cm intermediate attenuation lesion which demonstrates heterogeneous arterial phase enhancement, normalizing to the remainder of the liver parenchyma on portal venous phase and delayed phase imaging, favored to reflect a flash fill cavernous hemangioma. Findings are not compatible with an abscess. There is also a sub cm hypervascular area in the periphery of segment 4A (image 24 of series 3), which corresponds to a intermediate attenuation area on precontrast imaging (image 14 of series 2), and normalizes to the remainder of the liver parenchyma on delayed phase imaging, also favored to represent a small flash fill cavernous hemangioma. No other suspicious hepatic lesions are noted. No intra or extrahepatic biliary ductal dilatation. Gallbladder is normal in appearance.  Pancreas: No pancreatic mass. No pancreatic ductal dilatation. No pancreatic or peripancreatic fluid or inflammatory changes.  Spleen: Unremarkable.  Adrenals/Urinary Tract: Bilateral adrenal glands are normal in appearance. Sub cm low-attenuation lesions in the kidneys bilaterally are too small to definitively characterize, but are favored to represent tiny cysts. No hydroureteronephrosis. Urinary bladder is normal in  appearance.  Stomach/Bowel: The appearance of the stomach is normal. There is no pathologic dilatation of small bowel or colon. Normal appendix.  Vascular/Lymphatic: Atherosclerosis in the pelvic vasculature, without evidence of aneurysm or dissection in the abdomen or pelvis. No lymphadenopathy noted in the abdomen or pelvis.  Other: No significant volume of ascites.  No pneumoperitoneum.  Musculoskeletal: There are no aggressive appearing lytic or blastic lesions noted in the visualized portions of the skeleton.  IMPRESSION: 1. The area of concern in segment 5 of the liver has imaging characteristics compatible with a flash fill cavernous hemangioma. There is a second smaller flash fill cavernous hemangioma also noted in the periphery of segment 4A. No suspicious hepatic lesions are noted. 2. No acute abnormalities in the abdomen or pelvis to account for the patient's symptoms. 3. Normal appendix. 4. Mild atherosclerosis.   Electronically Signed  By: Vinnie Langton M.D.   On: 03/18/2016 19:22       Mr Abdomen W Wo Contrast  Result Date: 03/25/2016 CLINICAL DATA:  45 year old male with right upper quadrant abdominal pain and right flank pain. Liver mass detected on 03/18/2016 abdominal sonogram was characterized as a hemangioma on subsequent CT abdomen/pelvis study. EXAM: MRI ABDOMEN WITHOUT AND WITH CONTRAST TECHNIQUE: Multiplanar multisequence MR imaging of the abdomen was performed both before and after the administration of intravenous contrast. CONTRAST:  24mL MULTIHANCE GADOBENATE DIMEGLUMINE 529 MG/ML IV SOLN COMPARISON:  03/18/2016 abdominal sonogram and CT abdomen/pelvis. FINDINGS: Lower chest: Clear lung bases. Symmetric gynecomastia. Hepatobiliary: Normal liver size and configuration. No hepatic steatosis. There is a T2 hyperintense 3.6 x 2.6 cm segment 5 right liver lobe mass (series 5/image 31) with discontinuous progressive peripheral nodular enhancement,  consistent with a cavernous hemangioma. There is a 0.6 cm hemangioma in the segment 2 left liver dome. No additional liver masses. A small triangular peripheral wedge-shaped focus of arterial phase hyperenhancement in the segment 4A left liver lobe is occult on other sequences, consistent with a transient hepatic intensity difference. Normal gallbladder with no cholelithiasis. No biliary ductal dilatation. Common bile duct diameter 3 mm. No choledocholithiasis. Pancreas: No pancreatic mass or duct dilation.  No pancreas divisum. Spleen: Mildly enlarged spleen (craniocaudal splenic length 13.7 cm). No splenic mass. Adrenals/Urinary Tract: Normal adrenals. No hydronephrosis. There are subcentimeter simple renal cysts in the anterior mid to lower right kidney, medial upper left kidney and posterior interpolar left kidney. No suspicious renal mass. Stomach/Bowel: Grossly normal stomach. Visualized small and large bowel is normal caliber, with no bowel wall thickening. Vascular/Lymphatic: Normal caliber abdominal aorta. Patent portal, splenic, hepatic and renal veins. No pathologically enlarged lymph nodes in the abdomen. Other: No abdominal ascites or focal fluid collection. Musculoskeletal: No aggressive appearing focal osseous lesions. IMPRESSION: 1. Liver hemangiomas.  No suspicious liver masses. 2. Mildly enlarged spleen, which could be normal for this patient if the patient is above average in height. No abdominal lymphadenopathy. 3. Symmetric gynecomastia. 4. Tiny simple renal cysts. 5. No acute abnormality. Electronically Signed   By: Ilona Sorrel M.D.   On: 03/25/2016 14:22

## 2016-04-25 LAB — TISSUE TRANSGLUTAMINASE, IGA: Tissue Transglutaminase Ab, IgA: 1 U/mL (ref <4)

## 2016-04-25 LAB — IGA: IgA: 201 mg/dL (ref 81–463)

## 2016-04-28 NOTE — Patient Instructions (Addendum)
[name] Gregory  04/28/2016     @PREFPERIOPPHARMACY @   Your procedure is scheduled on 05/04/2016.  Report to [facility] at 8:15 A.M.  Call this number if you have problems the morning of surgery:  [phone]   Remember:  Do not eat food or drink liquids after midnight.  Take these medicines the morning of surgery with A SIP OF WATER : Norvasc, Coreg, Neurontin, Ativan   Do not wear jewelry, make-up or nail polish.  Do not wear lotions, powders, or perfumes, or deoderant.  Do not shave 48 hours prior to surgery.  Men may shave face and neck.  Do not bring valuables to the hospital.  [facility] is not responsible for any belongings or valuables.  Contacts, dentures or bridgework may not be worn into surgery.  Leave your suitcase in the car.  After surgery it may be brought to your room.  For patients admitted to the hospital, discharge time will be determined by your treatment team.  Patients discharged the day of surgery will not be allowed to drive home.   Name and phone number of your driver:   family Special instructions:  N/A  Please read over the following fact sheets that you were given. Care and Recovery After Surgery     Esophagogastroduodenoscopy Esophagogastroduodenoscopy (EGD) is a procedure that is used to examine the lining of the esophagus, stomach, and first part of the small intestine (duodenum). A long, flexible, lighted tube with a camera attached (endoscope) is inserted down the throat to view these organs. This procedure is done to detect problems or abnormalities, such as inflammation, bleeding, ulcers, or growths, in order to treat them. The procedure lasts 5-20 minutes. It is usually an outpatient procedure, but it may need to be performed in a hospital in emergency cases. LET [facility] CARE PROVIDER KNOW ABOUT:  Any allergies you have.  All medicines you are taking, including vitamins, herbs, eye drops, creams, and over-the-counter  medicines.  Previous problems you or members of your family have had with the use of anesthetics.  Any blood disorders you have.  Previous surgeries you have had.  Medical conditions you have. RISKS AND COMPLICATIONS Generally, this is a safe procedure. However, problems can occur and include:  Infection.  Bleeding.  Tearing (perforation) of the esophagus, stomach, or duodenum.  Difficulty breathing or not being able to breathe.  Excessive sweating.  Spasms of the larynx.  Slowed heartbeat.  Low blood pressure. BEFORE THE PROCEDURE  Do not eat or drink anything after midnight on the night before the procedure or as directed by your health care provider.  Do not take your regular medicines before the procedure if your health care provider asks you not to. Ask your health care provider about changing or stopping those medicines.  If you wear dentures, be prepared to remove them before the procedure.  Arrange for someone to drive you home after the procedure. PROCEDURE  A numbing medicine (local anesthetic) may be sprayed in your throat for comfort and to stop you from gagging or coughing.  You will have an IV tube inserted in a vein in your hand or arm. You will receive medicines and fluids through this tube.  You will be given a medicine to relax you (sedative).  A pain reliever will be given through the IV tube.  A mouth guard may be placed in your mouth to protect your teeth and to keep you from biting on the endoscope.  You  will be asked to lie on your left side.  The endoscope will be inserted down your throat and into your esophagus, stomach, and duodenum.  Air will be put through the endoscope to allow your health care provider to clearly view the lining of your esophagus.  The lining of your esophagus, stomach, and duodenum will be examined. During the exam, your health care provider may:  Remove tissue to be examined under a microscope (biopsy) for  inflammation, infection, or other medical problems.  Remove growths.  Remove objects (foreign bodies) that are stuck.  Treat any bleeding with medicines or other devices that stop tissues from bleeding (hot cautery, clipping devices).  Widen (dilate) or stretch narrowed areas of your esophagus and stomach.  The endoscope will be withdrawn. AFTER THE PROCEDURE  You will be taken to a recovery area for observation. Your blood pressure, heart rate, breathing rate, and blood oxygen level will be monitored often until the medicines you were given have worn off.  Do not eat or drink anything until the numbing medicine has worn off and your gag reflex has returned. You may choke.  Your health care provider should be able to discuss his or her findings with you. It will take longer to discuss the test results if any biopsies were taken.   This information is not intended to replace advice given to you by your health care provider. Make sure you discuss any questions you have with your health care provider.   Document Released: 12/01/2004 Document Revised: 08/21/2014 Document Reviewed: 07/03/2012 Elsevier Interactive Patient Education 2016 [employer].  Colonoscopy A colonoscopy is an exam to look at the entire large intestine (colon). This exam can help find problems such as tumors, polyps, inflammation, and areas of bleeding. The exam takes about 1 hour.  LET [facility] CARE PROVIDER KNOW ABOUT:   Any allergies you have.  All medicines you are taking, including vitamins, herbs, eye drops, creams, and over-the-counter medicines.  Previous problems you or members of your family have had with the use of anesthetics.  Any blood disorders you have.  Previous surgeries you have had.  Medical conditions you have. RISKS AND COMPLICATIONS  Generally, this is a safe procedure. However, as with any procedure, complications can occur. Possible complications include:  Bleeding.  Tearing or  rupture of the colon wall.  Reaction to medicines given during the exam.  Infection (rare). BEFORE THE PROCEDURE   Ask your health care provider about changing or stopping your regular medicines.  You may be prescribed an oral bowel prep. This involves drinking a large amount of medicated liquid, starting the day before your procedure. The liquid will cause you to have multiple loose stools until your stool is almost clear or light green. This cleans out your colon in preparation for the procedure.  Do not eat or drink anything else once you have started the bowel prep, unless your health care provider tells you it is safe to do so.  Arrange for someone to drive you home after the procedure. PROCEDURE   You will be given medicine to help you relax (sedative).  You will lie on your side with your knees bent.  A long, flexible tube with a light and camera on the end (colonoscope) will be inserted through the rectum and into the colon. The camera sends video back to a computer screen as it moves through the colon. The colonoscope also releases carbon dioxide gas to inflate the colon. This helps your health  care provider see the area better.  During the exam, your health care provider may take a small tissue sample (biopsy) to be examined under a microscope if any abnormalities are found.  The exam is finished when the entire colon has been viewed. AFTER THE PROCEDURE   Do not drive for 24 hours after the exam.  You may have a small amount of blood in your stool.  You may pass moderate amounts of gas and have mild abdominal cramping or bloating. This is caused by the gas used to inflate your colon during the exam.  Ask when your test results will be ready and how you will get your results. Make sure you get your test results.   This information is not intended to replace advice given to you by your health care provider. Make sure you discuss any questions you have with your health care  provider.   Document Released: 07/28/2000 Document Revised: 05/21/2013 Document Reviewed: 04/07/2013 Elsevier Interactive Patient Education [employer].

## 2016-05-02 ENCOUNTER — Encounter (HOSPITAL_COMMUNITY)
Admission: RE | Admit: 2016-05-02 | Discharge: 2016-05-02 | Disposition: A | Discharge status: Home / Self Care | Payer: 59 | Source: Ambulatory Visit | Attending: Internal Medicine | Admitting: Internal Medicine

## 2016-05-02 ENCOUNTER — Encounter (HOSPITAL_COMMUNITY): Payer: Self-pay

## 2016-05-02 DIAGNOSIS — Z7982 Long term (current) use of aspirin: Secondary | ICD-10-CM | POA: Diagnosis not present

## 2016-05-02 DIAGNOSIS — Z79899 Other long term (current) drug therapy: Secondary | ICD-10-CM | POA: Diagnosis not present

## 2016-05-02 DIAGNOSIS — K449 Diaphragmatic hernia without obstruction or gangrene: Secondary | ICD-10-CM | POA: Diagnosis not present

## 2016-05-02 DIAGNOSIS — K209 Esophagitis, unspecified: Secondary | ICD-10-CM | POA: Diagnosis not present

## 2016-05-02 DIAGNOSIS — F17200 Nicotine dependence, unspecified, uncomplicated: Secondary | ICD-10-CM | POA: Diagnosis not present

## 2016-05-02 DIAGNOSIS — G4730 Sleep apnea, unspecified: Secondary | ICD-10-CM | POA: Diagnosis not present

## 2016-05-02 DIAGNOSIS — I10 Essential (primary) hypertension: Secondary | ICD-10-CM | POA: Diagnosis not present

## 2016-05-02 DIAGNOSIS — K529 Noninfective gastroenteritis and colitis, unspecified: Secondary | ICD-10-CM | POA: Diagnosis not present

## 2016-05-02 DIAGNOSIS — Z6841 Body Mass Index (BMI) 40.0 and over, adult: Secondary | ICD-10-CM | POA: Diagnosis not present

## 2016-05-02 DIAGNOSIS — R1013 Epigastric pain: Secondary | ICD-10-CM | POA: Diagnosis present

## 2016-05-02 DIAGNOSIS — Z7984 Long term (current) use of oral hypoglycemic drugs: Secondary | ICD-10-CM | POA: Diagnosis not present

## 2016-05-02 DIAGNOSIS — K5730 Diverticulosis of large intestine without perforation or abscess without bleeding: Secondary | ICD-10-CM | POA: Diagnosis not present

## 2016-05-02 DIAGNOSIS — E119 Type 2 diabetes mellitus without complications: Secondary | ICD-10-CM | POA: Diagnosis not present

## 2016-05-02 DIAGNOSIS — D649 Anemia, unspecified: Secondary | ICD-10-CM | POA: Diagnosis not present

## 2016-05-02 HISTORY — DX: Other complications of anesthesia, initial encounter: T88.59XA

## 2016-05-02 HISTORY — DX: Adverse effect of unspecified anesthetic, initial encounter: T41.45XA

## 2016-05-02 LAB — BASIC METABOLIC PANEL
ANION GAP: 1 — AB (ref 5–15)
BUN: 11 mg/dL (ref 6–20)
CHLORIDE: 108 mmol/L (ref 101–111)
CO2: 25 mmol/L (ref 22–32)
Calcium: 8.5 mg/dL — ABNORMAL LOW (ref 8.9–10.3)
Creatinine, Ser: 0.54 mg/dL — ABNORMAL LOW (ref 0.61–1.24)
GFR calc Af Amer: >60 mL/min (ref >60)
Glucose, Bld: 197 mg/dL — ABNORMAL HIGH (ref 65–99)
POTASSIUM: 4.1 mmol/L (ref 3.5–5.1)
SODIUM: 134 mmol/L — AB (ref 135–145)

## 2016-05-02 LAB — CBC
HCT: 37.6 % — ABNORMAL LOW (ref 39.0–52.0)
HEMOGLOBIN: 12 g/dL — AB (ref 13.0–17.0)
MCH: 23.6 pg — AB (ref 26.0–34.0)
MCHC: 31.9 g/dL (ref 30.0–36.0)
MCV: 74 fL — AB (ref 78.0–100.0)
PLATELETS: 289 10*3/uL (ref 150–400)
RBC: 5.08 MIL/uL (ref 4.22–5.81)
RDW: 13.5 % (ref 11.5–15.5)
WBC: 10 10*3/uL (ref 4.0–10.5)

## 2016-05-02 NOTE — Progress Notes (Signed)
Please let patient know that stable mildly low Hgb, microcytosis but no evidence of IDA. No celiac disease.  Ferritin elevated, sed rate slightly elevated previously. Possibly some inflammation occurring.  I would recommend egd/tcs as planned. Further recommendations based on findings.

## 2016-05-04 ENCOUNTER — Ambulatory Visit (HOSPITAL_COMMUNITY): Payer: 59 | Admitting: Anesthesiology

## 2016-05-04 ENCOUNTER — Encounter (HOSPITAL_COMMUNITY): Payer: Self-pay

## 2016-05-04 ENCOUNTER — Encounter (HOSPITAL_COMMUNITY)
Admission: RE | Disposition: A | Discharge status: Home / Self Care | Payer: Self-pay | Source: Ambulatory Visit | Attending: Internal Medicine

## 2016-05-04 ENCOUNTER — Ambulatory Visit (HOSPITAL_COMMUNITY)
Admission: RE | Admit: 2016-05-04 | Discharge: 2016-05-04 | Disposition: A | Discharge status: Home / Self Care | Payer: 59 | Source: Ambulatory Visit | Attending: Internal Medicine | Admitting: Internal Medicine

## 2016-05-04 DIAGNOSIS — K209 Esophagitis, unspecified: Secondary | ICD-10-CM

## 2016-05-04 DIAGNOSIS — K5730 Diverticulosis of large intestine without perforation or abscess without bleeding: Secondary | ICD-10-CM | POA: Diagnosis not present

## 2016-05-04 DIAGNOSIS — G4730 Sleep apnea, unspecified: Secondary | ICD-10-CM | POA: Insufficient documentation

## 2016-05-04 DIAGNOSIS — K3189 Other diseases of stomach and duodenum: Secondary | ICD-10-CM | POA: Diagnosis not present

## 2016-05-04 DIAGNOSIS — K529 Noninfective gastroenteritis and colitis, unspecified: Secondary | ICD-10-CM

## 2016-05-04 DIAGNOSIS — I10 Essential (primary) hypertension: Secondary | ICD-10-CM | POA: Insufficient documentation

## 2016-05-04 DIAGNOSIS — Z79899 Other long term (current) drug therapy: Secondary | ICD-10-CM | POA: Insufficient documentation

## 2016-05-04 DIAGNOSIS — Z6841 Body Mass Index (BMI) 40.0 and over, adult: Secondary | ICD-10-CM | POA: Insufficient documentation

## 2016-05-04 DIAGNOSIS — R1013 Epigastric pain: Secondary | ICD-10-CM

## 2016-05-04 DIAGNOSIS — R1011 Right upper quadrant pain: Secondary | ICD-10-CM

## 2016-05-04 DIAGNOSIS — D649 Anemia, unspecified: Secondary | ICD-10-CM | POA: Insufficient documentation

## 2016-05-04 DIAGNOSIS — Z7982 Long term (current) use of aspirin: Secondary | ICD-10-CM | POA: Insufficient documentation

## 2016-05-04 DIAGNOSIS — E119 Type 2 diabetes mellitus without complications: Secondary | ICD-10-CM | POA: Insufficient documentation

## 2016-05-04 DIAGNOSIS — Z7984 Long term (current) use of oral hypoglycemic drugs: Secondary | ICD-10-CM | POA: Insufficient documentation

## 2016-05-04 DIAGNOSIS — K449 Diaphragmatic hernia without obstruction or gangrene: Secondary | ICD-10-CM | POA: Insufficient documentation

## 2016-05-04 DIAGNOSIS — F17200 Nicotine dependence, unspecified, uncomplicated: Secondary | ICD-10-CM | POA: Insufficient documentation

## 2016-05-04 HISTORY — PX: BIOPSY: SHX5522

## 2016-05-04 HISTORY — PX: COLONOSCOPY WITH PROPOFOL: SHX5780

## 2016-05-04 HISTORY — PX: ESOPHAGOGASTRODUODENOSCOPY (EGD) WITH PROPOFOL: SHX5813

## 2016-05-04 LAB — GLUCOSE, CAPILLARY: Glucose-Capillary: 186 mg/dL — ABNORMAL HIGH (ref 65–99)

## 2016-05-04 SURGERY — COLONOSCOPY WITH PROPOFOL
Anesthesia: Monitor Anesthesia Care

## 2016-05-04 MED ORDER — PROPOFOL 10 MG/ML IV BOLUS
INTRAVENOUS | Status: AC
  Filled 2016-05-04: qty 20

## 2016-05-04 MED ORDER — MIDAZOLAM HCL 2 MG/2ML IJ SOLN
1.0000 mg | INTRAMUSCULAR | Status: DC | PRN
  Administered 2016-05-04: 2 mg via INTRAVENOUS

## 2016-05-04 MED ORDER — HYDROMORPHONE HCL 1 MG/ML IJ SOLN: 0.2500 mg | INTRAMUSCULAR | Status: DC | PRN

## 2016-05-04 MED ORDER — CHLORHEXIDINE GLUCONATE CLOTH 2 % EX PADS: 6.0000 | MEDICATED_PAD | Freq: Once | CUTANEOUS | Status: DC

## 2016-05-04 MED ORDER — PROPOFOL 10 MG/ML IV BOLUS
INTRAVENOUS | Status: DC | PRN
  Administered 2016-05-04 (×3): 50 mg via INTRAVENOUS

## 2016-05-04 MED ORDER — LIDOCAINE VISCOUS 2 % MT SOLN
15.0000 mL | Freq: Once | OROMUCOSAL | Status: AC
  Administered 2016-05-04: 15 mL via OROMUCOSAL

## 2016-05-04 MED ORDER — LIDOCAINE VISCOUS 2 % MT SOLN
OROMUCOSAL | Status: AC
  Filled 2016-05-04: qty 15

## 2016-05-04 MED ORDER — MIDAZOLAM HCL 2 MG/2ML IJ SOLN
INTRAMUSCULAR | Status: AC
  Filled 2016-05-04: qty 2

## 2016-05-04 MED ORDER — LACTATED RINGERS IV SOLN
INTRAVENOUS | Status: DC
  Administered 2016-05-04: 1000 mL via INTRAVENOUS

## 2016-05-04 MED ORDER — STERILE WATER FOR IRRIGATION IR SOLN
Status: DC | PRN
  Administered 2016-05-04: 100 mL

## 2016-05-04 MED ORDER — PROPOFOL 500 MG/50ML IV EMUL
INTRAVENOUS | Status: DC | PRN
  Administered 2016-05-04: 75 ug/kg/min via INTRAVENOUS

## 2016-05-04 NOTE — Interval H&P Note (Signed)
History and Physical Interval Note:  05/04/2016 9:10 AM  [name]  has presented today for surgery, with the diagnosis of Epigastric pain  The various methods of treatment have been discussed with the patient and family. After consideration of risks, benefits and other options for treatment, the patient has consented to  Procedure(s) with comments: COLONOSCOPY WITH PROPOFOL (N/A) - 9:45 AM ESOPHAGOGASTRODUODENOSCOPY (EGD) WITH PROPOFOL (N/A) as a surgical intervention .  The patient's history has been reviewed, patient examined, no change in status, stable for surgery.  I have reviewed the patient's chart and labs.  Questions were answered to the patient's satisfaction.     No change. EGD and colonoscopy per plan.  The risks, benefits, limitations, imponderables and alternatives regarding both EGD and colonoscopy have been reviewed with the patient. Questions have been answered. All parties agreeable.     Manus Rudd

## 2016-05-04 NOTE — Anesthesia Postprocedure Evaluation (Signed)
Anesthesia Post Note  Patient: AYDN BOLIVAR  Procedure(s) Performed: Procedure(s) (LRB): COLONOSCOPY WITH PROPOFOL (N/A) ESOPHAGOGASTRODUODENOSCOPY (EGD) WITH PROPOFOL (N/A) BIOPSY  Patient location during evaluation: PACU Anesthesia Type: MAC Level of consciousness: awake and alert Pain management: pain level controlled Vital Signs Assessment: post-procedure vital signs reviewed and stable Respiratory status: spontaneous breathing and respiratory function stable Cardiovascular status: stable Anesthetic complications: no    Last Vitals:  Vitals:   05/04/16 1020 05/04/16 1036  BP:  121/85  Pulse: 76 73  Resp: 18 18  Temp:  36.8 C    Last Pain:  Vitals:   05/04/16 1036  TempSrc: Oral                 Avalee Castrellon DANIEL

## 2016-05-04 NOTE — Op Note (Signed)
[facility] Patient Name: [name] Gregory Procedure Date: 05/04/2016 9:13 AM MRN: [id number] Date of Birth: 1971/06/22 Attending MD: Norvel Richards , MD CSN: [id number] Age: 45 Admit Type: Outpatient Procedure:                Upper GI endoscopy with urease bx Indications:              Epigastric abdominal pain, Abdominal pain in the                            right upper quadrant Providers:                Norvel Richards, MD, Rosina Lowenstein, RN, Randa Spike, Technician Referring MD:              Medicines:                Propofol per Anesthesia Complications:            No immediate complications. Estimated blood loss:                            Minimal. Estimated Blood Loss:     Estimated blood loss was minimal. Procedure:                Pre-Anesthesia Assessment:                           - Prior to the procedure, a History and Physical                            was performed, and patient medications and                            allergies were reviewed. The patient's tolerance of                            previous anesthesia was also reviewed. The risks                            and benefits of the procedure and the sedation                            options and risks were discussed with the patient.                            All questions were answered, and informed consent                            was obtained. Prior Anticoagulants: The patient has                            taken no previous anticoagulant or antiplatelet  agents. ASA Grade Assessment: II - A patient with                            mild systemic disease. After reviewing the risks                            and benefits, the patient was deemed in                            satisfactory condition to undergo the procedure.                           After obtaining informed consent, the endoscope was                            passed under  direct vision. Throughout the                            procedure, the patient's blood pressure, pulse, and                            oxygen saturations were monitored continuously. The                            [id number]) scope was introduced through the                            mouth, and advanced to the second part of duodenum.                            The upper GI endoscopy was accomplished without                            difficulty. The patient tolerated the procedure                            well. Scope In: 9:24:19 AM Scope Out: 9:29:41 AM Total Procedure Duration: 0 hours 5 minutes 22 seconds  Findings:      LA Grade B (one or more mucosal breaks greater than 5 mm, not extending       between the tops of two mucosal folds) esophagitis was found 36 to 39 cm       from the incisors. No Barrett's epithelium seen      A small hiatal hernia was present.      Patchy mildly erythematous mucosa was found in the stomach. No ulcer or       infiltrating process. Pylorus patent.      The duodenal bulb and second portion of the duodenum were normal.       Biopsies were taken with a cold forceps for Helicobacter pylori testing       using CLOtest. Estimated blood loss was minimal. Impression:               - LA Grade B esophagitis. Esophagitis may have much  to do with his abdominal pain.                           - Small hiatal hernia.                           - Erythematous mucosa in the stomach.                           - Normal duodenal bulb and second portion of the                            duodenum. Biopsied. Moderate Sedation:      Moderate (conscious) sedation was personally administered by an       anesthesia professional. The following parameters were monitored: oxygen       saturation, heart rate, blood pressure, respiratory rate, EKG, adequacy       of pulmonary ventilation, and response to care. Total physician       intraservice  time was 15 minutes. Recommendation:           - Patient has a contact number available for                            emergencies. The signs and symptoms of potential                            delayed complications were discussed with the                            patient. Return to normal activities tomorrow.                            Written discharge instructions were provided to the                            patient.                           - Advance diet as tolerated.                           - Continue present medications.                           - Use Dexilant (dexlansoprazole) 60 mg PO daily                            indefinitely. Follow up on urease test. See                            colonoscopy report. Procedure Code(s):        --- Professional ---                           [phone], Esophagogastroduodenoscopy, flexible,  transoral; with biopsy, single or multiple Diagnosis Code(s):        --- Professional ---                           K20.9, Esophagitis, unspecified                           K44.9, Diaphragmatic hernia without obstruction or                            gangrene                           K31.89, Other diseases of stomach and duodenum                           R10.13, Epigastric pain                           R10.11, Right upper quadrant pain CPT copyright 2016 [employer]. All rights reserved. The codes documented in this report are preliminary and upon coder review may  be revised to meet current compliance requirements. Cristopher Estimable. Huntington Leverich, MD Norvel Richards, MD 05/04/2016 9:36:05 AM This report has been signed electronically. Number of Addenda: 0

## 2016-05-04 NOTE — H&P (View-Only) (Signed)
Primary Care Physician:  Foye Spurling, MD  Primary Gastroenterologist:  Garfield Cornea, MD   Chief Complaint  Patient presents with  . Abdominal Pain    referred for epigastric pain    HPI:  [name] Gregory is a 45 y.o. male here for further evaluation of abdominal pain. Patient states he's been having intermittent abdominal pain for few months. Pain tends to for normally be in the upper abdomen sometimes right lower quadrant. Typically lasts for a few minutes at a time, severe and sharp when it occurs. However back on 03/17/2016 he had acute onset severe right upper quadrant pain worse than usual. Felt like he had something in the right upper quadrant which was going to pop. Felt a lot of pressure. Not really worse with meals. Nothing seemed to make it better. He went to urgent care. Abdominal ultrasound revealed complex appearing mass in the right hepatic lobe near the porta hepatis with internal blood flow. Measure 3.1 x 2.3 x 3.4 cm. CT scan was done for further evaluation of his pain in this liver lesion. In segment 5 of liver there was 3.9 x 2.7 cm indeterminate attenuation lesion demonstrating heterogeneous arterial phase enhancement, normalizing to the remainder of the liver parenchyma on portal venous phase and delayed phase imaging, favored to reflect a flash fill cavernous hemangioma. There was a subcentimeter hypervascular area in the periphery of segment 4A also suspected the small flash fill cavernous hemangioma. No suspicious hepatic lesion is noted. Spleen unremarkable. MRI of the abdomen was done which confirmed liver hemangiomas as described. Mildly enlarged spleen which was felt could be normal for this patient patient is above average in height. No abdominal lymphadenopathy seen. Spleen measured 13.7 cm in craniocaudal length.   Patient also has chronic postprandial abdominal cramping and diarrhea. No bright red blood per rectum. Complains of black stools the last time a couple of  weeks ago. He takes aspirin 81 mg daily but no other NSAIDs. Chronically wakes up in the morning feels nauseated. Occurs even if he consumes food with his medication. Sometimes returns later in the day. No vomiting. No heartburn. Sometimes belches and has a "rotten smell".  Patient's labs revealed mild anemia. Microcytosis noted. He also had elevated sedimentation rate.    Current Outpatient Prescriptions  Medication Sig Dispense Refill  . amLODipine (NORVASC) 10 MG tablet Take 10 mg by mouth daily.    Marland Kitchen aspirin 81 MG tablet Take 81 mg by mouth daily.    Marland Kitchen buPROPion (ZYBAN) 150 MG 12 hr tablet Take 150 mg by mouth 2 (two) times daily.    . carvedilol (COREG) 25 MG tablet Take 25 mg by mouth 2 (two) times daily with a meal.    . gabapentin (NEURONTIN) 300 MG capsule Take 300 mg by mouth 2 (two) times daily. Take one capsule by mouth in the morning and two at bedtime for nerve pain     . glimepiride (AMARYL) 2 MG tablet Take 1 tablet (2 mg total) by mouth daily before breakfast. 30 tablet 0  . LORazepam (ATIVAN) 0.5 MG tablet Take 0.5 mg by mouth every 8 (eight) hours as needed for anxiety.     . valsartan-hydrochlorothiazide (DIOVAN HCT) 320-25 MG per tablet Take 1 tablet by mouth daily. 30 tablet 0  . Vitamin D, Ergocalciferol, (DRISDOL) 50000 units CAPS capsule Take 1 capsule by mouth 3 (three) times a week.     No current facility-administered medications for this visit.     Allergies as of  04/24/2016  . (No Known Allergies)    Past Medical History:  Diagnosis Date  . Diabetes ([place])   . Hypertension   . Sleep apnea     Past Surgical History:  Procedure Laterality Date  . EYE SURGERY    . SALIVARY GLAND SURGERY  1992    Family History  Problem Relation Age of Onset  . Hypertension Mother   . CVA Father   . Diabetes Father   . Throat cancer Father   . Heart disease Brother 87  . Cervical cancer Maternal Grandmother   . Throat cancer Maternal Grandmother   . Throat  cancer Maternal Grandfather   . Bladder Cancer Maternal Grandfather   . Colon cancer Neg Hx     Social History   Social History  . Marital status: Married    Spouse name: N/A  . Number of children: N/A  . 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 Smoking status: Current Every Day Smoker  . Smokeless tobacco: Never Used  . Alcohol use No  . Drug use: No  . Sexual activity: Not on file   Other Topics Concern  . Not on file   Social History Narrative  . No narrative on file      ROS:  General: Negative for anorexia, weight loss, fever, chills, fatigue, weakness. Eyes: Negative for vision changes.  ENT: Negative for hoarseness, difficulty swallowing , nasal congestion. CV: Negative for chest pain, angina, palpitations, dyspnea on exertion, peripheral edema.  Respiratory: Negative for dyspnea at rest, dyspnea on exertion, cough, sputum, wheezing.  GI: See history of present illness. GU:  Negative for dysuria, hematuria, urinary incontinence, urinary frequency, nocturnal urination.  MS: Negative for joint pain, low back pain.  Derm: Negative for rash or itching.  Neuro: Negative for weakness, abnormal sensation, seizure, frequent headaches, memory loss, confusion.  Psych: Negative for anxiety, depression, suicidal ideation, hallucinations.  Endo: Negative for unusual weight change.  Heme: Negative for bruising or bleeding. Allergy: Negative for rash or hives.    Physical Examination:  BP 133/84   Pulse 86   Temp 98.2 F (36.8 C) (Oral)   Ht 6\' 2"  (1.88 m)   Wt (!) 310 lb (140.6 kg)   BMI 39.80 kg/m    General: Well-nourished, well-developed in no acute distress.  Head: Normocephalic, atraumatic.   Eyes: Conjunctiva pink, no icterus. Mouth: Oropharyngeal mucosa moist and pink , no lesions erythema or exudate. Neck: Supple without thyromegaly, masses, or lymphadenopathy.  Lungs: Clear to  auscultation bilaterally.  Heart: Regular rate and rhythm, no murmurs rubs or gallops.  Abdomen: Bowel sounds are normal, obese, moderate tenderness in the epigastrium/right upper quadrant, no hepatosplenomegaly or masses, no abdominal bruits or    hernia , no rebound or guarding.   Rectal: Deferred Extremities: No lower extremity edema. No clubbing or deformities.  Neuro: Alert and oriented x 4 , grossly normal neurologically.  Skin: Warm and dry, no rash or jaundice.   Psych: Alert and cooperative, normal mood and affect.  Labs: Labs from 03/21/2016 White blood cell count 9900, hemoglobin 12.5 slightly low, MCV 74 low, platelets 306,000, glucose 229, creatinine 0.59, BUN 9, sodium 136, potassium 4.4. Total bilirubin 0.5, alkaline phosphatase 95, AST 14, ALT 23, albumin 4.4, lipase 15, sedimentation rate 26 high  Imaging Studies: CLINICAL DATA:  Right upper quadrant pain after meals for 3 days. Intermittent nausea.  EXAM: US  ABDOMEN LIMITED - RIGHT UPPER QUADRANT  COMPARISON:  None.  FINDINGS: Gallbladder:  No gallstones or wall thickening visualized. No sonographic Murphy sign noted by sonographer.  Common bile duct:  Diameter: 2.1 mm  Liver:  There is a complex mass, possibly containing cystic components, in the liver near the porta hepatis measuring 3.1 x 2.3 x 3.4 cm. There is blood flow in this region and the patient was very tender during scanning of this region.  IMPRESSION: 3.1 x 2.3 x 3.4 cm complex masslike region in the right hepatic lobe near the porta hepatis with internal blood flow. This is a nonspecific finding. Both a neoplastic process and an abscess are possible. While an MRI would probably best characterize this abnormality, in the acute setting, I would recommend starting with a CT scan for further characterization.   Electronically Signed    By: Dorise Bullion III M.D   On: 03/18/2016 16:32  Study Result   CLINICAL DATA:   45 year old male with history of right-sided abdominal pain for the past 3 days with some associated nausea. Mass in the liver noted on recent ultrasound examination.  EXAM: CT ABDOMEN AND PELVIS WITHOUT AND WITH CONTRAST  TECHNIQUE: Multidetector CT imaging of the abdomen and pelvis was performed following the standard protocol before and following the bolus administration of intravenous contrast.  CONTRAST:  186mL ISOVUE-300 IOPAMIDOL (ISOVUE-300) INJECTION 61%  COMPARISON:  No prior.  Abdominal ultrasound 03/18/2016.  FINDINGS: Lower chest:  Unremarkable.  Hepatobiliary: The area of concern is located predominantly in segment 5 of the liver where there is a 3.9 x 2.7 cm intermediate attenuation lesion which demonstrates heterogeneous arterial phase enhancement, normalizing to the remainder of the liver parenchyma on portal venous phase and delayed phase imaging, favored to reflect a flash fill cavernous hemangioma. Findings are not compatible with an abscess. There is also a sub cm hypervascular area in the periphery of segment 4A (image 24 of series 3), which corresponds to a intermediate attenuation area on precontrast imaging (image 14 of series 2), and normalizes to the remainder of the liver parenchyma on delayed phase imaging, also favored to represent a small flash fill cavernous hemangioma. No other suspicious hepatic lesions are noted. No intra or extrahepatic biliary ductal dilatation. Gallbladder is normal in appearance.  Pancreas: No pancreatic mass. No pancreatic ductal dilatation. No pancreatic or peripancreatic fluid or inflammatory changes.  Spleen: Unremarkable.  Adrenals/Urinary Tract: Bilateral adrenal glands are normal in appearance. Sub cm low-attenuation lesions in the kidneys bilaterally are too small to definitively characterize, but are favored to represent tiny cysts. No hydroureteronephrosis. Urinary bladder is normal in  appearance.  Stomach/Bowel: The appearance of the stomach is normal. There is no pathologic dilatation of small bowel or colon. Normal appendix.  Vascular/Lymphatic: Atherosclerosis in the pelvic vasculature, without evidence of aneurysm or dissection in the abdomen or pelvis. No lymphadenopathy noted in the abdomen or pelvis.  Other: No significant volume of ascites.  No pneumoperitoneum.  Musculoskeletal: There are no aggressive appearing lytic or blastic lesions noted in the visualized portions of the skeleton.  IMPRESSION: 1. The area of concern in segment 5 of the liver has imaging characteristics compatible with a flash fill cavernous hemangioma. There is a second smaller flash fill cavernous hemangioma also noted in the periphery of segment 4A. No suspicious hepatic lesions are noted. 2. No acute abnormalities in the abdomen or pelvis to account for the patient's symptoms. 3. Normal appendix. 4. Mild atherosclerosis.   Electronically Signed  By: Vinnie Langton M.D.   On: 03/18/2016 19:22       Mr Abdomen W Wo Contrast  Result Date: 03/25/2016 CLINICAL DATA:  45 year old male with right upper quadrant abdominal pain and right flank pain. Liver mass detected on 03/18/2016 abdominal sonogram was characterized as a hemangioma on subsequent CT abdomen/pelvis study. EXAM: MRI ABDOMEN WITHOUT AND WITH CONTRAST TECHNIQUE: Multiplanar multisequence MR imaging of the abdomen was performed both before and after the administration of intravenous contrast. CONTRAST:  15mL MULTIHANCE GADOBENATE DIMEGLUMINE 529 MG/ML IV SOLN COMPARISON:  03/18/2016 abdominal sonogram and CT abdomen/pelvis. FINDINGS: Lower chest: Clear lung bases. Symmetric gynecomastia. Hepatobiliary: Normal liver size and configuration. No hepatic steatosis. There is a T2 hyperintense 3.6 x 2.6 cm segment 5 right liver lobe mass (series 5/image 31) with discontinuous progressive peripheral nodular enhancement,  consistent with a cavernous hemangioma. There is a 0.6 cm hemangioma in the segment 2 left liver dome. No additional liver masses. A small triangular peripheral wedge-shaped focus of arterial phase hyperenhancement in the segment 4A left liver lobe is occult on other sequences, consistent with a transient hepatic intensity difference. Normal gallbladder with no cholelithiasis. No biliary ductal dilatation. Common bile duct diameter 3 mm. No choledocholithiasis. Pancreas: No pancreatic mass or duct dilation.  No pancreas divisum. Spleen: Mildly enlarged spleen (craniocaudal splenic length 13.7 cm). No splenic mass. Adrenals/Urinary Tract: Normal adrenals. No hydronephrosis. There are subcentimeter simple renal cysts in the anterior mid to lower right kidney, medial upper left kidney and posterior interpolar left kidney. No suspicious renal mass. Stomach/Bowel: Grossly normal stomach. Visualized small and large bowel is normal caliber, with no bowel wall thickening. Vascular/Lymphatic: Normal caliber abdominal aorta. Patent portal, splenic, hepatic and renal veins. No pathologically enlarged lymph nodes in the abdomen. Other: No abdominal ascites or focal fluid collection. Musculoskeletal: No aggressive appearing focal osseous lesions. IMPRESSION: 1. Liver hemangiomas.  No suspicious liver masses. 2. Mildly enlarged spleen, which could be normal for this patient if the patient is above average in height. No abdominal lymphadenopathy. 3. Symmetric gynecomastia. 4. Tiny simple renal cysts. 5. No acute abnormality. Electronically Signed   By: Ilona Sorrel M.D.   On: 03/25/2016 14:22

## 2016-05-04 NOTE — Anesthesia Postprocedure Evaluation (Signed)
Anesthesia Post Note  Patient: [name] Gregory  Procedure(s) Performed: Procedure(s) (LRB): COLONOSCOPY WITH PROPOFOL (N/A) ESOPHAGOGASTRODUODENOSCOPY (EGD) WITH PROPOFOL (N/A) BIOPSY  Patient location during evaluation: PACU Anesthesia Type: MAC Level of consciousness: awake and alert Pain management: pain level controlled Vital Signs Assessment: post-procedure vital signs reviewed and stable Respiratory status: spontaneous breathing, nonlabored ventilation, respiratory function stable and patient connected to nasal cannula oxygen Cardiovascular status: stable and blood pressure returned to baseline Anesthetic complications: no    Last Vitals:  Vitals:   05/04/16 1020 05/04/16 1036  BP:  121/85  Pulse: 76 73  Resp: 18 18  Temp:  36.8 C    Last Pain:  Vitals:   05/04/16 1036  TempSrc: Oral                 Corran Lalone

## 2016-05-04 NOTE — Addendum Note (Signed)
Addendum  created 05/04/16 1107 by Alvy Bimler, CRNA   Sign clinical note

## 2016-05-04 NOTE — Anesthesia Preprocedure Evaluation (Addendum)
Anesthesia Evaluation  Patient identified by MRN, date of birth, ID band Patient awake    Reviewed: Allergy & Precautions, NPO status , Patient's Chart, lab work & pertinent test results  Airway Mallampati: II  TM Distance: >3 FB Neck ROM: Full    Dental no notable dental hx. (+) Dental Advisory Given   Pulmonary sleep apnea , Current Smoker,    Pulmonary exam normal        Cardiovascular hypertension, Pt. on home beta blockers and Pt. on medications Normal cardiovascular exam     Neuro/Psych negative neurological ROS  negative psych ROS   GI/Hepatic Neg liver ROS,   Endo/Other  diabetesMorbid obesity  Renal/GU negative Renal ROS     Musculoskeletal   Abdominal   Peds  Hematology   Anesthesia Other Findings   Reproductive/Obstetrics                            Anesthesia Physical Anesthesia Plan  ASA: III  Anesthesia Plan: MAC   Post-op Pain Management:    Induction:   Airway Management Planned: Simple Face Mask and Natural Airway  Additional Equipment:   Intra-op Plan:   Post-operative Plan:   Informed Consent: I have reviewed the patients History and Physical, chart, labs and discussed the procedure including the risks, benefits and alternatives for the proposed anesthesia with the patient or authorized representative who has indicated his/her understanding and acceptance.   Dental advisory given  Plan Discussed with: CRNA, Anesthesiologist and Surgeon  Anesthesia Plan Comments:        Anesthesia Quick Evaluation

## 2016-05-04 NOTE — Discharge Instructions (Addendum)
Colonic diverticulosis information provided  Repeat colonoscopy in 10 years for screening purposes  GERD information provided  Begin Dexilant 60 mg daily-go by my office for a 3 week supply of samples  Office visit with [place] in 6-8 weeks. November 2 at 8:30 with [facility]    Colonoscopy Discharge Instructions  Read the instructions outlined below and refer to this sheet in the next few weeks. These discharge instructions provide you with general information on caring for yourself after you leave the hospital. Your doctor may also give you specific instructions. While your treatment has been planned according to the most current medical practices available, unavoidable complications occasionally occur. If you have any problems or questions after discharge, call Dr. Gala Romney at [phone]. ACTIVITY  You may resume your regular activity, but move at a slower pace for the next 24 hours.   Take frequent rest periods for the next 24 hours.   Walking will help get rid of the air and reduce the bloated feeling in your belly (abdomen).   No driving for 24 hours (because of the medicine (anesthesia) used during the test).    Do not sign any important legal documents or operate any machinery for 24 hours (because of the anesthesia used during the test).  NUTRITION  Drink plenty of fluids.   You may resume your normal diet as instructed by your doctor.   Begin with a light meal and progress to your normal diet. Heavy or fried foods are harder to digest and may make you feel sick to your stomach (nauseated).   Avoid alcoholic beverages for 24 hours or as instructed.  MEDICATIONS  You may resume your normal medications unless your doctor tells you otherwise.  WHAT YOU CAN EXPECT TODAY  Some feelings of bloating in the abdomen.   Passage of more gas than usual.   Spotting of blood in your stool or on the toilet paper.  IF YOU HAD POLYPS REMOVED DURING THE COLONOSCOPY:  No aspirin products  for 7 days or as instructed.   No alcohol for 7 days or as instructed.   Eat a soft diet for the next 24 hours.  FINDING OUT THE RESULTS OF YOUR TEST Not all test results are available during your visit. If your test results are not back during the visit, make an appointment with your caregiver to find out the results. Do not assume everything is normal if you have not heard from your caregiver or the medical facility. It is important for you to follow up on all of your test results.  SEEK IMMEDIATE MEDICAL ATTENTION IF:  You have more than a spotting of blood in your stool.   Your belly is swollen (abdominal distention).   You are nauseated or vomiting.   You have a temperature over 101.   You have abdominal pain or discomfort that is severe or gets worse throughout the day.  EGD Discharge instructions Please read the instructions outlined below and refer to this sheet in the next few weeks. These discharge instructions provide you with general information on caring for yourself after you leave the hospital. Your doctor may also give you specific instructions. While your treatment has been planned according to the most current medical practices available, unavoidable complications occasionally occur. If you have any problems or questions after discharge, please call your doctor. ACTIVITY  You may resume your regular activity but move at a slower pace for the next 24 hours.   Take frequent rest periods for  the next 24 hours.   Walking will help expel (get rid of) the air and reduce the bloated feeling in your abdomen.   No driving for 24 hours (because of the anesthesia (medicine) used during the test).   You may shower.   Do not sign any important legal documents or operate any machinery for 24 hours (because of the anesthesia used during the test).  NUTRITION  Drink plenty of fluids.   You may resume your normal diet.   Begin with a light meal and progress to your normal  diet.   Avoid alcoholic beverages for 24 hours or as instructed by your caregiver.  MEDICATIONS  You may resume your normal medications unless your caregiver tells you otherwise.  WHAT YOU CAN EXPECT TODAY  You may experience abdominal discomfort such as a feeling of fullness or gas pains.  FOLLOW-UP  Your doctor will discuss the results of your test with you.  SEEK IMMEDIATE MEDICAL ATTENTION IF ANY OF THE FOLLOWING OCCUR:  Excessive nausea (feeling sick to your stomach) and/or vomiting.   Severe abdominal pain and distention (swelling).   Trouble swallowing.   Temperature over 101 F (37.8 C).   Rectal bleeding or vomiting of blood.     Diverticulosis Diverticulosis is the condition that develops when small pouches (diverticula) form in the wall of your colon. Your colon, or large intestine, is where water is absorbed and stool is formed. The pouches form when the inside layer of your colon pushes through weak spots in the outer layers of your colon. CAUSES  No one knows exactly what causes diverticulosis. RISK FACTORS  Being older than 20. Your risk for this condition increases with age. Diverticulosis is rare in people younger than 40 years. By age 31, almost everyone has it.  Eating a low-fiber diet.  Being frequently constipated.  Being overweight.  Not getting enough exercise.  Smoking.  Taking over-the-counter pain medicines, like aspirin and ibuprofen. SYMPTOMS  Most people with diverticulosis do not have symptoms. DIAGNOSIS  Because diverticulosis often has no symptoms, health care providers often discover the condition during an exam for other colon problems. In many cases, a health care provider will diagnose diverticulosis while using a flexible scope to examine the colon (colonoscopy). TREATMENT  If you have never developed an infection related to diverticulosis, you may not need treatment. If you have had an infection before, treatment may  include:  Eating more fruits, vegetables, and grains.  Taking a fiber supplement.  Taking a live bacteria supplement (probiotic).  Taking medicine to relax your colon. HOME CARE INSTRUCTIONS   Drink at least 6-8 glasses of water each day to prevent constipation.  Try not to strain when you have a bowel movement.  Keep all follow-up appointments. If you have had an infection before:  Increase the fiber in your diet as directed by your health care provider or dietitian.  Take a dietary fiber supplement if your health care provider approves.  Only take medicines as directed by your health care provider. SEEK MEDICAL CARE IF:   You have abdominal pain.  You have bloating.  You have cramps.  You have not gone to the bathroom in 3 days. SEEK IMMEDIATE MEDICAL CARE IF:   Your pain gets worse.  Yourbloating becomes very bad.  You have a fever or chills, and your symptoms suddenly get worse.  You begin vomiting.  You have bowel movements that are bloody or black. MAKE SURE YOU:  Understand these instructions.  Will watch your condition.  Will get help right away if you are not doing well or get worse.   This information is not intended to replace advice given to you by your health care provider. Make sure you discuss any questions you have with your health care provider.   Document Released: 04/27/2004 Document Revised: 08/05/2013 Document Reviewed: 06/25/2013 Elsevier Interactive Patient Education 2016 [place].  Gastroesophageal Reflux Disease, Adult Normally, food travels down the esophagus and stays in the stomach to be digested. However, when a person has gastroesophageal reflux disease (GERD), food and stomach acid move back up into the esophagus. When this happens, the esophagus becomes sore and inflamed. Over time, GERD can create small holes (ulcers) in the lining of the esophagus.  CAUSES This condition is caused by a problem with the muscle between  the esophagus and the stomach (lower esophageal sphincter, or LES). Normally, the LES muscle closes after food passes through the esophagus to the stomach. When the LES is weakened or abnormal, it does not close properly, and that allows food and stomach acid to go back up into the esophagus. The LES can be weakened by certain dietary substances, medicines, and medical conditions, including:  Tobacco use.  Pregnancy.  Having a hiatal hernia.  Heavy alcohol use.  Certain foods and beverages, such as coffee, chocolate, onions, and peppermint. RISK FACTORS This condition is more likely to develop in:  People who have an increased body weight.  People who have connective tissue disorders.  People who use NSAID medicines. SYMPTOMS Symptoms of this condition include:  Heartburn.  Difficult or painful swallowing.  The feeling of having a lump in the throat.  Abitter taste in the mouth.  Bad breath.  Having a large amount of saliva.  Having an upset or bloated stomach.  Belching.  Chest pain.  Shortness of breath or wheezing.  Ongoing (chronic) cough or a night-time cough.  Wearing away of tooth enamel.  Weight loss. Different conditions can cause chest pain. Make sure to see your health care provider if you experience chest pain. DIAGNOSIS Your health care provider will take a medical history and perform a physical exam. To determine if you have mild or severe GERD, your health care provider may also monitor how you respond to treatment. You may also have other tests, including:  An endoscopy toexamine your stomach and esophagus with a small camera.  A test thatmeasures the acidity level in your esophagus.  A test thatmeasures how much pressure is on your esophagus.  A barium swallow or modified barium swallow to show the shape, size, and functioning of your esophagus. TREATMENT The goal of treatment is to help relieve your symptoms and to prevent  complications. Treatment for this condition may vary depending on how severe your symptoms are. Your health care provider may recommend:  Changes to your diet.  Medicine.  Surgery. HOME CARE INSTRUCTIONS Diet  Follow a diet as recommended by your health care provider. This may involve avoiding foods and drinks such as:  Coffee and tea (with or without caffeine).  Drinks that containalcohol.  Energy drinks and sports drinks.  Carbonated drinks or sodas.  Chocolate and cocoa.  Peppermint and mint flavorings.  Garlic and onions.  Horseradish.  Spicy and acidic foods, including peppers, chili powder, curry powder, vinegar, hot sauces, and barbecue sauce.  Citrus fruit juices and citrus fruits, such as oranges, lemons, and limes.  Tomato-based foods, such as red sauce, chili, salsa, and pizza with red  sauce.  [place] and fatty foods, such as donuts, french fries, potato chips, and high-fat dressings.  High-fat meats, such as hot dogs and fatty cuts of red and white meats, such as rib eye steak, sausage, ham, and bacon.  High-fat dairy items, such as whole milk, butter, and cream cheese.  Eat small, frequent meals instead of large meals.  Avoid drinking large amounts of liquid with your meals.  Avoid eating meals during the 2-3 hours before bedtime.  Avoid lying down right after you eat.  Do not exercise right after you eat. General Instructions  Pay attention to any changes in your symptoms.  Take over-the-counter and prescription medicines only as told by your health care provider. Do not take aspirin, ibuprofen, or other NSAIDs unless your health care provider told you to do so.  Do not use any tobacco products, including cigarettes, chewing tobacco, and e-cigarettes. If you need help quitting, ask your health care provider.  Wear loose-fitting clothing. Do not wear anything tight around your waist that causes pressure on your abdomen.  Raise (elevate) the  head of your bed 6 inches (15cm).  Try to reduce your stress, such as with yoga or meditation. If you need help reducing stress, ask your health care provider.  If you are overweight, reduce your weight to an amount that is healthy for you. Ask your health care provider for guidance about a safe weight loss goal.  Keep all follow-up visits as told by your health care provider. This is important. SEEK MEDICAL CARE IF:  You have new symptoms.  You have unexplained weight loss.  You have difficulty swallowing, or it hurts to swallow.  You have wheezing or a persistent cough.  Your symptoms do not improve with treatment.  You have a hoarse voice. SEEK IMMEDIATE MEDICAL CARE IF:  You have pain in your arms, neck, jaw, teeth, or back.  You feel sweaty, dizzy, or light-headed.  You have chest pain or shortness of breath.  You vomit and your vomit looks like blood or coffee grounds.  You faint.  Your stool is bloody or black.  You cannot swallow, drink, or eat.   This information is not intended to replace advice given to you by your health care provider. Make sure you discuss any questions you have with your health care provider.   Document Released: 05/10/2005 Document Revised: 04/21/2015 Document Reviewed: 11/25/2014 Elsevier Interactive Patient Education [employer].

## 2016-05-04 NOTE — Transfer of Care (Signed)
Immediate Anesthesia Transfer of Care Note  Patient: [name] Gregory  Procedure(s) Performed: Procedure(s) with comments: COLONOSCOPY WITH PROPOFOL (N/A) - 9:45 AM ESOPHAGOGASTRODUODENOSCOPY (EGD) WITH PROPOFOL (N/A) BIOPSY - gastric bx  Patient Location: PACU  Anesthesia Type:MAC  Level of Consciousness: awake, alert , oriented and patient cooperative  Airway & Oxygen Therapy: Patient Spontanous Breathing and Patient connected to face mask oxygen  Post-op Assessment: Report given to RN, Post -op Vital signs reviewed and stable and Patient moving all extremities X 4  Post vital signs: Reviewed and stable  Last Vitals:  Vitals:   05/04/16 0905 05/04/16 0910  BP: 134/75 (!) 131/95  Pulse:    Resp: 20 16  Temp:      Last Pain:  Vitals:   05/04/16 0829  TempSrc: Oral      Patients Stated Pain Goal: 7 [id number])  Complications: No apparent anesthesia complications

## 2016-05-04 NOTE — Op Note (Signed)
[facility] Patient Name: [name] Gregory Procedure Date: 05/04/2016 9:33 AM MRN: [id number] Date of Birth: 1970/09/29 Attending MD: Norvel Richards , MD CSN: [id number] Age: 45 Admit Type: Outpatient Procedure:                Ileo-colonoscopy - diagnostic Indications:              Chronic diarrhea Providers:                Norvel Richards, MD, Rosina Lowenstein, RN, Randa Spike, Technician Referring MD:              Medicines:                Propofol per Anesthesia Complications:            No immediate complications. Estimated Blood Loss:     Estimated blood loss: none. Procedure:                Pre-Anesthesia Assessment:                           - Prior to the procedure, a History and Physical                            was performed, and patient medications and                            allergies were reviewed. The patient's tolerance of                            previous anesthesia was also reviewed. The risks                            and benefits of the procedure and the sedation                            options and risks were discussed with the patient.                            All questions were answered, and informed consent                            was obtained. Prior Anticoagulants: The patient has                            taken no previous anticoagulant or antiplatelet                            agents. ASA Grade Assessment: II - A patient with                            mild systemic disease. After reviewing the risks  and benefits, the patient was deemed in                            satisfactory condition to undergo the procedure.                           After obtaining informed consent, the colonoscope                            was passed under direct vision. Throughout the                            procedure, the patient's blood pressure, pulse, and                            oxygen  saturations were monitored continuously. The                            [id number]) scope was introduced through                            the anus and advanced to the 5 cm into the ileum.                            The colonoscopy was performed without difficulty.                            The patient tolerated the procedure well. The                            quality of the bowel preparation was adequate. The                            ileocecal valve, appendiceal orifice, and rectum                            were photographed. The terminal ileum, ileocecal                            valve, appendiceal orifice, and rectum were                            photographed. The entire colon was well visualized. Scope In: 9:37:00 AM Scope Out: 9:47:46 AM Scope Withdrawal Time: 0 hours 8 minutes 19 seconds  Total Procedure Duration: 0 hours 10 minutes 46 seconds  Findings:      The perianal and digital rectal examinations were normal.      Multiple small and large-mouthed diverticula were found in the entire       colon. The distal 5 cm of terminal ileal mucosa also appeared normal.      The exam was otherwise without abnormality on direct and retroflexion       views. Impression:               - Diverticulosis in the entire examined colon.                           -  The examination was otherwise normal on direct                            and retroflexion views.                           - No specimens collected. Moderate Sedation:      Moderate (conscious) sedation was personally administered by an       anesthesia professional. The following parameters were monitored: oxygen       saturation, heart rate, blood pressure, respiratory rate, EKG, adequacy       of pulmonary ventilation, and response to care. Total physician       intraservice time was 10 minutes. Recommendation:           - Patient has a contact number available for                            emergencies. The  signs and symptoms of potential                            delayed complications were discussed with the                            patient. Return to normal activities tomorrow.                            Written discharge instructions were provided to the                            patient.                           - Advance diet as tolerated.                           - Continue present medications.                           - Repeat colonoscopy in 10 years for screening                            purposes.                           - Return to GI clinic in 8 weeks. See EGD report. Procedure Code(s):        --- Professional ---                           [phone], Colonoscopy, flexible; diagnostic, including                            collection of specimen(s) by brushing or washing,                            when performed (separate procedure) Diagnosis Code(s):        --- Professional ---  K52.9, Noninfective gastroenteritis and colitis,                            unspecified                           K57.30, Diverticulosis of large intestine without                            perforation or abscess without bleeding CPT copyright 2016 American Medical Association. All rights reserved. The codes documented in this report are preliminary and upon coder review may  be revised to meet current compliance requirements. Cristopher Estimable. Caddie Randle, MD Norvel Richards, MD 05/04/2016 9:56:13 AM This report has been signed electronically. Number of Addenda: 0

## 2016-05-05 LAB — CLOTEST (H. PYLORI), BIOPSY: Helicobacter screen: NEGATIVE

## 2016-05-06 ENCOUNTER — Encounter: Payer: Self-pay | Admitting: Internal Medicine

## 2016-05-09 ENCOUNTER — Encounter (HOSPITAL_COMMUNITY): Payer: Self-pay | Admitting: Internal Medicine

## 2016-05-25 ENCOUNTER — Telehealth: Payer: Self-pay | Admitting: Internal Medicine

## 2016-05-25 NOTE — Telephone Encounter (Signed)
Routing to the refill box. 

## 2016-05-25 NOTE — Telephone Encounter (Signed)
Pt has used his dexilant samples and needs a prescription called into [facility].

## 2016-05-29 MED ORDER — DEXLANSOPRAZOLE 60 MG PO CPDR: 60.0000 mg | DELAYED_RELEASE_CAPSULE | Freq: Every day | ORAL | 2 refills | Status: DC

## 2016-05-29 NOTE — Telephone Encounter (Signed)
Rx sent to pharmacy as requested.

## 2016-05-29 NOTE — Addendum Note (Signed)
Addended by: Gordy Levan, Dakai Braithwaite A on: 05/29/2016 12:00 PM   Modules accepted: Orders

## 2016-06-15 ENCOUNTER — Ambulatory Visit: Payer: 59 | Admitting: Nurse Practitioner

## 2016-06-29 ENCOUNTER — Telehealth: Payer: Self-pay | Admitting: Nurse Practitioner

## 2016-06-29 ENCOUNTER — Encounter: Payer: Self-pay | Admitting: Nurse Practitioner

## 2016-06-29 ENCOUNTER — Ambulatory Visit: Payer: 59 | Admitting: Nurse Practitioner

## 2016-06-29 NOTE — Telephone Encounter (Signed)
Pt was a no show and letter sent  °

## 2016-06-29 NOTE — Telephone Encounter (Signed)
Noted  

## 2016-11-29 ENCOUNTER — Ambulatory Visit: Payer: 59 | Admitting: Podiatry

## 2016-12-13 ENCOUNTER — Ambulatory Visit: Payer: 59 | Admitting: Podiatry

## 2017-03-15 ENCOUNTER — Other Ambulatory Visit (HOSPITAL_BASED_OUTPATIENT_CLINIC_OR_DEPARTMENT_OTHER): Payer: Self-pay

## 2017-03-15 DIAGNOSIS — R0683 Snoring: Secondary | ICD-10-CM

## 2017-03-15 DIAGNOSIS — G4710 Hypersomnia, unspecified: Secondary | ICD-10-CM

## 2017-03-15 DIAGNOSIS — R5383 Other fatigue: Secondary | ICD-10-CM

## 2017-03-21 ENCOUNTER — Ambulatory Visit (HOSPITAL_BASED_OUTPATIENT_CLINIC_OR_DEPARTMENT_OTHER): Payer: 59 | Attending: Internal Medicine | Admitting: Internal Medicine

## 2017-03-21 DIAGNOSIS — G4710 Hypersomnia, unspecified: Secondary | ICD-10-CM | POA: Diagnosis present

## 2017-03-21 DIAGNOSIS — G4733 Obstructive sleep apnea (adult) (pediatric): Secondary | ICD-10-CM

## 2017-03-21 DIAGNOSIS — R0683 Snoring: Secondary | ICD-10-CM | POA: Insufficient documentation

## 2017-03-21 DIAGNOSIS — R5383 Other fatigue: Secondary | ICD-10-CM | POA: Insufficient documentation

## 2017-03-28 ENCOUNTER — Other Ambulatory Visit (HOSPITAL_BASED_OUTPATIENT_CLINIC_OR_DEPARTMENT_OTHER): Payer: Self-pay

## 2017-03-28 DIAGNOSIS — R0683 Snoring: Secondary | ICD-10-CM

## 2017-03-28 DIAGNOSIS — G4710 Hypersomnia, unspecified: Secondary | ICD-10-CM

## 2017-03-28 DIAGNOSIS — R5383 Other fatigue: Secondary | ICD-10-CM

## 2017-03-31 ENCOUNTER — Emergency Department (HOSPITAL_COMMUNITY): Payer: 59

## 2017-03-31 ENCOUNTER — Encounter (HOSPITAL_COMMUNITY): Payer: Self-pay | Admitting: *Deleted

## 2017-03-31 ENCOUNTER — Emergency Department (HOSPITAL_COMMUNITY)
Admission: EM | Admit: 2017-03-31 | Discharge: 2017-03-31 | Disposition: A | Discharge status: Home / Self Care | Payer: 59 | Attending: Emergency Medicine | Admitting: Emergency Medicine

## 2017-03-31 DIAGNOSIS — Z7982 Long term (current) use of aspirin: Secondary | ICD-10-CM | POA: Diagnosis not present

## 2017-03-31 DIAGNOSIS — F17200 Nicotine dependence, unspecified, uncomplicated: Secondary | ICD-10-CM | POA: Insufficient documentation

## 2017-03-31 DIAGNOSIS — R1011 Right upper quadrant pain: Secondary | ICD-10-CM | POA: Diagnosis not present

## 2017-03-31 DIAGNOSIS — I10 Essential (primary) hypertension: Secondary | ICD-10-CM | POA: Diagnosis not present

## 2017-03-31 DIAGNOSIS — G4733 Obstructive sleep apnea (adult) (pediatric): Secondary | ICD-10-CM | POA: Diagnosis not present

## 2017-03-31 DIAGNOSIS — Z79899 Other long term (current) drug therapy: Secondary | ICD-10-CM | POA: Diagnosis not present

## 2017-03-31 DIAGNOSIS — E119 Type 2 diabetes mellitus without complications: Secondary | ICD-10-CM | POA: Insufficient documentation

## 2017-03-31 HISTORY — DX: Hemangioma of intra-abdominal structures: D18.03

## 2017-03-31 LAB — LIPASE, BLOOD: Lipase: 23 U/L (ref 11–51)

## 2017-03-31 LAB — COMPREHENSIVE METABOLIC PANEL
ALT: 18 U/L (ref 17–63)
AST: 12 U/L — AB (ref 15–41)
Albumin: 3.9 g/dL (ref 3.5–5.0)
Alkaline Phosphatase: 91 U/L (ref 38–126)
Anion gap: 6 (ref 5–15)
BILIRUBIN TOTAL: 0.5 mg/dL (ref 0.3–1.2)
BUN: 13 mg/dL (ref 6–20)
CO2: 26 mmol/L (ref 22–32)
Calcium: 9.1 mg/dL (ref 8.9–10.3)
Chloride: 104 mmol/L (ref 101–111)
Creatinine, Ser: 0.63 mg/dL (ref 0.61–1.24)
GFR calc Af Amer: >60 mL/min (ref >60)
Glucose, Bld: 185 mg/dL — ABNORMAL HIGH (ref 65–99)
POTASSIUM: 4.4 mmol/L (ref 3.5–5.1)
Sodium: 136 mmol/L (ref 135–145)
Total Protein: 7.3 g/dL (ref 6.5–8.1)

## 2017-03-31 LAB — URINALYSIS, ROUTINE W REFLEX MICROSCOPIC
BILIRUBIN URINE: NEGATIVE
GLUCOSE, UA: NEGATIVE mg/dL
Hgb urine dipstick: NEGATIVE
KETONES UR: NEGATIVE mg/dL
Leukocytes, UA: NEGATIVE
Nitrite: NEGATIVE
PH: 7 (ref 5.0–8.0)
Protein, ur: NEGATIVE mg/dL
SPECIFIC GRAVITY, URINE: 1.014 (ref 1.005–1.030)

## 2017-03-31 LAB — CBC
HEMATOCRIT: 39.4 % (ref 39.0–52.0)
Hemoglobin: 12.5 g/dL — ABNORMAL LOW (ref 13.0–17.0)
MCH: 23.5 pg — ABNORMAL LOW (ref 26.0–34.0)
MCHC: 31.7 g/dL (ref 30.0–36.0)
MCV: 73.9 fL — ABNORMAL LOW (ref 78.0–100.0)
Platelets: 337 10*3/uL (ref 150–400)
RBC: 5.33 MIL/uL (ref 4.22–5.81)
RDW: 13.8 % (ref 11.5–15.5)
WBC: 12.5 10*3/uL — AB (ref 4.0–10.5)

## 2017-03-31 MED ORDER — SODIUM CHLORIDE 0.9 % IV BOLUS (SEPSIS)
1000.0000 mL | Freq: Once | INTRAVENOUS | Status: AC
Start: 2017-03-31 — End: 2017-03-31
  Administered 2017-03-31: 1000 mL via INTRAVENOUS

## 2017-03-31 MED ORDER — SODIUM CHLORIDE 0.9 % IV SOLN: INTRAVENOUS | Status: DC

## 2017-03-31 MED ORDER — IOPAMIDOL (ISOVUE-300) INJECTION 61%
100.0000 mL | Freq: Once | INTRAVENOUS | Status: AC | PRN
  Administered 2017-03-31: 100 mL via INTRAVENOUS

## 2017-03-31 MED ORDER — HYDROCODONE-ACETAMINOPHEN 5-325 MG PO TABS: 1.0000 | ORAL_TABLET | Freq: Four times a day (QID) | ORAL | 0 refills | Status: DC | PRN

## 2017-03-31 NOTE — Discharge Instructions (Signed)
Today's workup without any acute findings. Follow-up of with Dr. Sydell Axon your gastroenterologist. Return for any new or worse symptoms. Take medications as directed.

## 2017-03-31 NOTE — ED Provider Notes (Signed)
[name] DEPT Provider Note   CSN: [id number] Arrival date & time: 03/31/17  1152     History   Chief Complaint Chief Complaint  Patient presents with  . Abdominal Pain    HPI [name] Gregory is a 46 y.o. male.   Patient with a complaint of right upper quadrant pain and right flank pain on and off for a while Butts been worse the past 2 days. Associated with Gregory no vomiting. 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 Followed by gastroenterology for a liver hemangioma. And GERD. Patient denies any fevers. Any blood in the bowel movements.      Past Medical History:  Diagnosis Date  . Complication of anesthesia    WOKE UP DURING SURGERY  . Diabetes ([place])   . Hemangioma of liver   . Hypertension   . Sleep apnea     Patient Active Problem List   Diagnosis Date Noted  . Abdominal pain, epigastric 04/24/2016  . RUQ pain 04/24/2016  . Microcytic anemia 04/24/2016  . Diarrhea 04/24/2016  . Elevated sed rate 04/24/2016  . Splenomegaly 04/24/2016    Past Surgical History:  Procedure Laterality Date  . BIOPSY  05/04/2016   Procedure: BIOPSY;  Surgeon: Daneil Dolin, MD;  [facility]: [facility];  Service: Endoscopy;;  gastric bx  . COLONOSCOPY WITH PROPOFOL N/A 05/04/2016   Procedure: COLONOSCOPY WITH PROPOFOL;  Surgeon: Daneil Dolin, MD;  Location: [facility];  Service: Endoscopy;  Laterality: N/A;  9:45 AM  . ESOPHAGOGASTRODUODENOSCOPY (EGD) WITH PROPOFOL N/A 05/04/2016   Procedure: ESOPHAGOGASTRODUODENOSCOPY (EGD) WITH PROPOFOL;  Surgeon: Daneil Dolin, MD;  Location: [facility];  Service: Endoscopy;  Laterality: N/A;  . EYE SURGERY    . SALIVARY GLAND SURGERY  1992       Home Medications    Prior to Admission medications   Medication Sig Start Date End Date Taking? Authorizing Provider  amLODipine (NORVASC) 10 MG tablet Take 10 mg by mouth daily. 03/11/16   [provider]  aspirin 81 MG tablet Take 81 mg by mouth daily.     [provider]  buPROPion (ZYBAN) 150 MG 12 hr tablet Take 150 mg by mouth 2 (two) times daily.    [provider]  carvedilol (COREG) 25 MG tablet Take 25 mg by mouth 2 (two) times daily with a meal.    [provider]  dexlansoprazole (DEXILANT) 60 MG capsule Take 1 capsule (60 mg total) by mouth daily. 05/29/16   Carlis Stable, NP  gabapentin (NEURONTIN) 300 MG capsule Take 300 mg by mouth 2 (two) times daily. Take one capsule by mouth in the morning and two at bedtime for nerve pain     [provider]  glimepiride (AMARYL) 2 MG tablet Take 1 tablet (2 mg total) by mouth daily before breakfast. 03/18/16   Wendie Agreste, MD  LORazepam (ATIVAN) 0.5 MG tablet Take 0.5 mg by mouth every 8 (eight) hours as needed for anxiety.     [provider]  valsartan-hydrochlorothiazide (DIOVAN HCT) 320-25 MG per tablet Take 1 tablet by mouth daily. 09/18/14   Triplett, Tammy, PA-C  Vitamin D, Ergocalciferol, (DRISDOL) 50000 units CAPS capsule Take 1 capsule by mouth 3 (three) times a week. 02/03/16   [provider]    Family History Family History  Problem Relation Age of Onset  . Hypertension Mother   . CVA Father   . Diabetes Father   . Throat  cancer Father   . Heart disease Brother 42  . Cervical cancer Maternal Grandmother   . Throat cancer Maternal Grandmother   . Throat cancer Maternal Grandfather   . Bladder Cancer Maternal Grandfather   . Colon cancer Neg Hx     Social History Social History  Substance Use Topics  . Smoking status: Current Every Day Smoker  . Smokeless tobacco: Never Used  . Alcohol use No     Allergies   Patient has no known allergies.   Review of Systems Review of Systems  Constitutional: Negative for fever.  HENT: Negative for congestion.   Eyes: Negative for redness.  Respiratory: Negative for shortness of breath.   Cardiovascular: Negative for chest pain.  Gastrointestinal: Positive for  constipation, diarrhea and Gregory. Negative for abdominal pain, blood in stool and vomiting.  Genitourinary: Positive for flank pain. Negative for dysuria.  Musculoskeletal: Negative for back pain.  Skin: Negative for rash.  Neurological: Negative for headaches.  Psychiatric/Behavioral: Negative for confusion.     Physical Exam Updated Vital Signs BP (!) 135/94 (BP Location: Left Arm)   Pulse 70   Temp 98.5 F (36.9 C) (Oral)   Resp 18   Ht 1.88 m (6\' 2" )   Wt (!) 141.5 kg (312 lb)   SpO2 100%   BMI 40.06 kg/m   Physical Exam  Constitutional: He is oriented to person, place, and time. He appears well-developed and well-nourished. No distress.  HENT:  Head: Normocephalic and atraumatic.  Mouth/Throat: Oropharynx is clear and moist.  Eyes: Pupils are equal, round, and reactive to light. EOM are normal.  Neck: Normal range of motion. Neck supple.  Cardiovascular: Normal rate and regular rhythm.   Pulmonary/Chest: Effort normal and breath sounds normal.  Abdominal: Soft. Bowel sounds are normal. There is tenderness.  Tenderness to palpation right upper quadrant.  Musculoskeletal: Normal range of motion.  Neurological: He is alert and oriented to person, place, and time. No cranial nerve deficit or sensory deficit. He exhibits normal muscle tone. Coordination normal.  Skin: Skin is warm.  Nursing note and vitals reviewed.    ED Treatments / Results  Labs (all labs ordered are listed, but only abnormal results are displayed) Labs Reviewed  COMPREHENSIVE METABOLIC PANEL - Abnormal; Notable for the following:       Result Value   Glucose, Bld 185 (*)    AST 12 (*)    All other components within normal limits  CBC - Abnormal; Notable for the following:    WBC 12.5 (*)    Hemoglobin 12.5 (*)    MCV 73.9 (*)    MCH 23.5 (*)    All other components within normal limits  LIPASE, BLOOD  URINALYSIS, ROUTINE W REFLEX MICROSCOPIC    EKG  EKG Interpretation None        Radiology Ct Abdomen Pelvis W Contrast  Result Date: 03/31/2017 CLINICAL DATA:  Right-sided abdominal pain with Gregory. EXAM: CT ABDOMEN AND PELVIS WITH CONTRAST TECHNIQUE: Multidetector CT imaging of the abdomen and pelvis was performed using the standard protocol following bolus administration of intravenous contrast. CONTRAST:  184mL ISOVUE-300 IOPAMIDOL (ISOVUE-300) INJECTION 61% COMPARISON:  CT abdomen and pelvis March 18, 2016; MR abdomen March 25, 2016 FINDINGS: Lower chest: Lung bases are clear. Hepatobiliary: Liver measures 22.0 cm, similar to prior study. The previously documented hemangioma in the anterior segment of the right lobe of the liver is again noted, unchanged. This hemangioma measures 3.6 x 2.3 cm, not appreciably changed. No  other focal liver lesion is evident on this current examination. A subcentimeter hemangioma noted in the liver on MR is not seen on this current examination. Gallbladder wall is not appreciably thickened. There is no biliary duct dilatation. Pancreas: No pancreatic mass or inflammatory focus. Spleen: No splenic lesions are evident. Adrenals/Urinary Tract: Adrenals appear unremarkable bilaterally. There is a cyst in the medial mid left kidney measuring 9 x 9 mm. A tiny cyst in the anterior mid right kidney seen on MR is not convincingly seen on this study. There is no hydronephrosis on either side. There is no renal or ureteral calculus on either side. Urinary bladder is midline with wall thickness within normal limits. Stomach/Bowel: There is no appreciable bowel wall are mesenteric thickening. There is no appreciable bowel obstruction. No free air or portal venous air. Vascular/Lymphatic: There is no abdominal aortic aneurysm. There is focal calcification in the right and left common iliac arteries. There is no appreciable adenopathy in the abdomen or pelvis. Reproductive: Prostate and seminal vesicles are normal in size and contour. No evident pelvic mass.  Other: Appendix appears normal. No ascites or abscess evident in the abdomen or pelvis. There is a minimal ventral hernia containing only fat. Musculoskeletal: There is disc space narrowing at L4-5 with vacuum phenomenon. There is broad-based disc bulging at this level. There are no blastic or lytic bone lesions. There is no intramuscular or abdominal wall lesion. IMPRESSION: 1. No bowel wall thickening or bowel obstruction. No abscess. Appendix appears normal. 2. Stable hemangioma in the anterior segment right lobe of the liver. Stable prominence of the liver. 3.   No renal or ureteral calculus.  No hydronephrosis. 4.  Foci of calcification in each iliac artery. 5.  Minimal ventral hernia containing only fat. Electronically Signed   By: Lowella Grip III M.D.   On: 03/31/2017 15:16    Procedures Procedures (including critical care time)  Medications Ordered in ED Medications  0.9 %  sodium chloride infusion (not administered)  sodium chloride 0.9 % bolus 1,000 mL (1,000 mLs Intravenous New Bag/Given 03/31/17 1453)  iopamidol (ISOVUE-300) 61 % injection 100 mL (100 mLs Intravenous Contrast Given 03/31/17 1459)     Initial Impression / Assessment and Plan / ED Course  I have reviewed the triage vital signs and the nursing notes.  Pertinent labs & imaging results that were available during my care of the patient were reviewed by me and considered in my medical decision making (see chart for details).      Also nothing to explain the right flank pain. Patient stable for discharge home and follow-up with his gastroenterologist. Patient is pain may be secondary to hemangioma.CT scan of the abdomen without any exhalation for the right upper quadrant pain as well. Labs without any sniffing abnormality of the night mild leukocytosis.  Final Clinical Impressions(s) / ED Diagnoses   Final diagnoses:  Right upper quadrant abdominal pain    New Prescriptions New Prescriptions   No medications on  file     Fredia Sorrow, MD 03/31/17 1616

## 2017-03-31 NOTE — ED Triage Notes (Addendum)
Pt c/o right sided abdominal pain, nausea, constipation and diarrhea x 2 weeks. Pt is scheduled to see Dr. Gala Romney in Sept for liver problems.

## 2017-03-31 NOTE — Procedures (Signed)
  Patient Name: [name] Gregory, [name] Gregory Date: 03/22/2017 Gender: Male D.O.B: 06/26/1971 Age (years): 46 Referring Provider: Jeanann Lewandowsky Height (inches): 30 Interpreting Physician: Baird Lyons MD, ABSM Weight (lbs): 312 RPSGT: Jacolyn Reedy BMI: 40 MRN: [id number] Neck Size: 19.50 CLINICAL INFORMATION Sleep Study Type: unattended HST  Indication for sleep study: Excessive Daytime Sleepiness, Fatigue, Snoring  Epworth Sleepiness Score: 12  SLEEP STUDY TECHNIQUE A multi-channel overnight portable sleep study was performed. The channels recorded were: nasal airflow, thoracic respiratory movement, and oxygen saturation with a pulse oximetry. Snoring was also monitored.  MEDICATIONS Patient self administered medications include: none reported.  SLEEP ARCHITECTURE Patient was studied for 420.8 minutes. The sleep efficiency was 100.0 % and the patient was supine for 40.3%. The arousal index was 0.0 per hour.  RESPIRATORY PARAMETERS The overall AHI was 62.3 per hour, with a central apnea index of 0.0 per hour.  The oxygen nadir was 70% during sleep.  CARDIAC DATA Mean heart rate during sleep was 81.3 bpm.  IMPRESSIONS - Severe obstructive sleep apnea occurred during this study (AHI = 62.3/h). - No significant central sleep apnea occurred during this study (CAI = 0.0/h). - Oxygen desaturation was noted during this study (Min O2 = 70%, Mean 92%). - Patient snored.  DIAGNOSIS - Obstructive Sleep Apnea (327.23 [G47.33 ICD-10])])  RECOMMENDATIONS - Scores in this range are usually addressed with CPAP. Other options would be based on clinical judgment. - Weight management and regular exercise should be initiated or continued.  [Electronically signed] 03/31/2017 12:09 PM  Baird Lyons MD, [place], American Board of Sleep Medicine   NPI: [id number]  [place], American Board of Sleep Medicine  ELECTRONICALLY SIGNED ON:  03/31/2017, 12:06  PM [place] PH: [phone]   FX: [phone] [place]

## 2017-05-17 ENCOUNTER — Ambulatory Visit: Payer: 59 | Admitting: Family Medicine

## 2017-05-17 ENCOUNTER — Ambulatory Visit (INDEPENDENT_AMBULATORY_CARE_PROVIDER_SITE_OTHER): Payer: 59 | Admitting: Gastroenterology

## 2017-05-17 ENCOUNTER — Other Ambulatory Visit: Payer: Self-pay

## 2017-05-17 ENCOUNTER — Telehealth: Payer: Self-pay

## 2017-05-17 ENCOUNTER — Encounter: Payer: Self-pay | Admitting: Gastroenterology

## 2017-05-17 VITALS — BP 134/92 | HR 81 | Temp 98.3°F | Ht 74.0 in | Wt 317.2 lb

## 2017-05-17 DIAGNOSIS — D509 Iron deficiency anemia, unspecified: Secondary | ICD-10-CM

## 2017-05-17 DIAGNOSIS — R1011 Right upper quadrant pain: Secondary | ICD-10-CM

## 2017-05-17 NOTE — Telephone Encounter (Signed)
PA info for HIDA submitted via [facility] website. Case was sent for clinical review.

## 2017-05-17 NOTE — Telephone Encounter (Signed)
HIDA scan scheduled for 05/24/17 at 8:00am, pt to arrive at 7:45am. NPO after MN, no pain med after MN. Tried to call pt to inform of appt, no answer, left detailed VM. Letter mailed.

## 2017-05-17 NOTE — Patient Instructions (Signed)
1. Complete stool test and return to our office.  2. Gallbladder function test.

## 2017-05-17 NOTE — Progress Notes (Signed)
Primary Care Physician: Foye Spurling, MD  Primary Gastroenterologist:  Garfield Cornea, MD   Chief Complaint  Patient presents with  . Abdominal Pain    ruq  . Nausea    HPI: [name] Gregory is a 46 y.o. male here for further evaluation of ruq abd pain and nausea. Last seen in 04/2016. Similar symptoms at that time. Workup revealed 3.6 x 2.6 cm segment 5 right liver lobe mass consistent with cavernous hemangioma, smaller hemangioma in the segment 2 left liver dome. Felt to be incidental and not cause of his symptoms. EGD 04/2016 with gastritis, reflux esophagitis. Colonoscopy with diverticulosis.   Since we last saw him he had another CT abdomen and pelvis with contrast on 03/31/2017 while in the ED. Liver measures 22 cm similar to prior study. Unchanged hemangioma anterior segment of the right lobe of the liver measuring 3.6 x 2.3 cm. The subcentimeter arrange hemangioma noted on liver on MRI 2017 not identifiable on current CT. Minimal ventral hernia containing fat only. Labs well in the ED showed a white count of 12,500, hemoglobin slightly low at 12.5 but hematocrit normal at 39.4, MCV low at 73.9, platelets 337,000.  Constant nausea. Weird taste in mouth. Ruq pain intemittent. Dark stools. From diarrhea to constipation. RUQ pain makes him nauseated at times.  Sometimes can eat and fine but sometimes pain happens without meals. No heartburn. No brbpr.    Current Outpatient Prescriptions  Medication Sig Dispense Refill  . amLODipine (NORVASC) 10 MG tablet Take 10 mg by mouth daily.    Marland Kitchen aspirin 81 MG tablet Take 81 mg by mouth daily.    Marland Kitchen buPROPion (ZYBAN) 150 MG 12 hr tablet Take 150 mg by mouth 2 (two) times daily.    . carvedilol (COREG) 25 MG tablet Take 25 mg by mouth 2 (two) times daily with a meal.    . citalopram (CELEXA) 20 MG tablet Take 1 tablet by mouth daily.    . cyclobenzaprine (FLEXERIL) 10 MG tablet Take 1 tablet by mouth as needed.    Marland Kitchen dexlansoprazole  (DEXILANT) 60 MG capsule Take 1 capsule (60 mg total) by mouth daily. 90 capsule 2  . gabapentin (NEURONTIN) 300 MG capsule Take 300 mg by mouth 2 (two) times daily. Take one capsule by mouth in the morning and two at bedtime for nerve pain     . glimepiride (AMARYL) 2 MG tablet Take 1 tablet (2 mg total) by mouth daily before breakfast. 30 tablet 0  . irbesartan-hydrochlorothiazide (AVALIDE) 300-12.5 MG tablet Take 1 tablet by mouth daily.    Marland Kitchen LORazepam (ATIVAN) 0.5 MG tablet Take 0.5 mg by mouth every 8 (eight) hours as needed for anxiety.     . metFORMIN (GLUCOPHAGE-XR) 500 MG 24 hr tablet Take 1 tablet by mouth daily.    . Naproxen-Esomeprazole (VIMOVO) 500-20 MG TBEC Take by mouth daily.    . Vitamin D, Ergocalciferol, (DRISDOL) 50000 units CAPS capsule Take 1 capsule by mouth 3 (three) times a week.     No current facility-administered medications for this visit.     Allergies as of 05/17/2017  . (No Known Allergies)    ROS:  General: Negative for anorexia, weight loss, fever, chills, fatigue, weakness. ENT: Negative for hoarseness, difficulty swallowing , nasal congestion. CV: Negative for chest pain, angina, palpitations, dyspnea on exertion, peripheral edema.  Respiratory: Negative for dyspnea at rest, dyspnea on exertion, cough, sputum, wheezing.  GI: See history of present illness.  GU:  Negative for dysuria, hematuria, urinary incontinence, urinary frequency, nocturnal urination.  Endo: Negative for unusual weight change.    Physical Examination:   BP (!) 134/92   Pulse 81   Temp 98.3 F (36.8 C) (Oral)   Ht 6\' 2"  (1.88 m)   Wt (!) 317 lb 3.2 oz (143.9 kg)   BMI 40.73 kg/m   General: Well-nourished, well-developed in no acute distress.  Eyes: No icterus. Mouth: Oropharyngeal mucosa moist and pink , no lesions erythema or exudate. Lungs: Clear to auscultation bilaterally.  Heart: Regular rate and rhythm, no murmurs rubs or gallops.  Abdomen: Bowel sounds are  normal, nontender, nondistended, no hepatosplenomegaly or masses, no abdominal bruits or hernia , no rebound or guarding.   Extremities: No lower extremity edema. No clubbing or deformities. Neuro: Alert and oriented x 4   Skin: Warm and dry, no jaundice.   Psych: Alert and cooperative, normal mood and affect.  Labs:  Lab Results  Component Value Date   CREATININE 0.63 03/31/2017   BUN 13 03/31/2017   NA 136 03/31/2017   K 4.4 03/31/2017   CL 104 03/31/2017   CO2 26 03/31/2017   Lab Results  Component Value Date   ALT 18 03/31/2017   AST 12 (L) 03/31/2017   ALKPHOS 91 03/31/2017   BILITOT 0.5 03/31/2017   Lab Results  Component Value Date   WBC 12.5 (H) 03/31/2017   HGB 12.5 (L) 03/31/2017   HCT 39.4 03/31/2017   MCV 73.9 (L) 03/31/2017   PLT 337 03/31/2017     Imaging Studies: No results found.

## 2017-05-18 ENCOUNTER — Ambulatory Visit (INDEPENDENT_AMBULATORY_CARE_PROVIDER_SITE_OTHER): Payer: 59 | Admitting: Family Medicine

## 2017-05-18 ENCOUNTER — Encounter: Payer: Self-pay | Admitting: Family Medicine

## 2017-05-18 VITALS — BP 135/94 | HR 88 | Resp 16 | Ht 74.0 in | Wt 318.0 lb

## 2017-05-18 DIAGNOSIS — Z9989 Dependence on other enabling machines and devices: Secondary | ICD-10-CM

## 2017-05-18 DIAGNOSIS — G4733 Obstructive sleep apnea (adult) (pediatric): Secondary | ICD-10-CM | POA: Diagnosis not present

## 2017-05-18 NOTE — Patient Instructions (Addendum)
Please follow-up to discuss vitamin supplement or deficiencies to determine if repeat testing is needed or any change in medications.  I placed a referral for CPAP titration.    IF you received an x-ray today, you will receive an invoice from [facility]. Please contact [facility] at [phone] with questions or concerns regarding your invoice.   IF you received labwork today, you will receive an invoice from [place]. Please contact [facility] at [phone] with questions or concerns regarding your invoice.   Our billing staff will not be able to assist you with questions regarding bills from these companies.  You will be contacted with the lab results as soon as they are available. The fastest way to get your results is to activate your My Chart account. Instructions are located on the last page of this paperwork. If you have not heard from [place] regarding the results in 2 weeks, please contact this office.

## 2017-05-18 NOTE — Progress Notes (Signed)
Subjective:  By signing my name below, I, [name] Gregory, attest that this documentation has been prepared under the direction and in the presence of [name] Agreste, MD Electronically Signed: Ladene Artist, ED Scribe 05/18/2017 at 11:38 AM.   Patient ID: [name] Gregory, male    DOB: 1971-07-07, 46 y.o.   MRN: [id number]  Chief Complaint  Patient presents with  . Sleep Apnea    need titration study done   HPI [name] Gregory is a 46 y.o. male who presents to Primary Care at [facility] to discuss sleep apnea. H/o obstructive sleep apnea. Seen by Dr. Baird Lyons, most recently 8/8. H/o obesity. Home sleep study was performed with AHI 62.3, severe OSA.   Pt already has a c-pap but states it's ~44 years old. States his previous PCP Foye Spurling, MD ordered the sleep study but the order did not include the titration. Pt presents today to have a titration study done.  At the end of the visit pt also mentions a supplement that he needs refilled. States his RBC drops and he has developed microcytic anemia. Not sure of supplement.   Patient Active Problem List   Diagnosis Date Noted  . Abdominal pain, epigastric 04/24/2016  . RUQ pain 04/24/2016  . Microcytic anemia 04/24/2016  . Diarrhea 04/24/2016  . Elevated sed rate 04/24/2016  . Splenomegaly 04/24/2016   Past Medical History:  Diagnosis Date  . Complication of anesthesia    WOKE UP DURING SURGERY  . Diabetes ([place])   . Hemangioma of liver   . Hypertension   . Sleep apnea    Past Surgical History:  Procedure Laterality Date  . BIOPSY  05/04/2016   Procedure: BIOPSY;  Surgeon: Daneil Dolin, MD;  [facility];  Service: Endoscopy;;  gastric bx  . COLONOSCOPY WITH PROPOFOL N/A 05/04/2016   Procedure: COLONOSCOPY WITH PROPOFOL;  Surgeon: Daneil Dolin, MD;  [facility];  Service: Endoscopy;  Laterality: N/A;  9:45 AM  . ESOPHAGOGASTRODUODENOSCOPY (EGD) WITH PROPOFOL N/A 05/04/2016   Procedure:  ESOPHAGOGASTRODUODENOSCOPY (EGD) WITH PROPOFOL;  Surgeon: Daneil Dolin, MD;  [facility];  Service: Endoscopy;  Laterality: N/A;  . EYE SURGERY    . SALIVARY GLAND SURGERY  1992   No Known Allergies Prior to Admission medications   Medication Sig Start Date End Date Taking? Authorizing Provider  amLODipine (NORVASC) 10 MG tablet Take 10 mg by mouth daily. 03/11/16   [provider]  aspirin 81 MG tablet Take 81 mg by mouth daily.    [provider]  buPROPion (ZYBAN) 150 MG 12 hr tablet Take 150 mg by mouth 2 (two) times daily.    [provider]  carvedilol (COREG) 25 MG tablet Take 25 mg by mouth 2 (two) times daily with a meal.    [provider]  citalopram (CELEXA) 20 MG tablet Take 1 tablet by mouth daily. 04/24/17   [provider]  cyclobenzaprine (FLEXERIL) 10 MG tablet Take 1 tablet by mouth as needed. 03/22/17   [provider]  dexlansoprazole (DEXILANT) 60 MG capsule Take 1 capsule (60 mg total) by mouth daily. 05/29/16   Carlis Stable, NP  gabapentin (NEURONTIN) 300 MG capsule Take 300 mg by mouth 2 (two) times daily. Take one capsule by mouth in the morning and two at bedtime for nerve pain     [provider]  glimepiride (AMARYL) 2 MG tablet Take 1 tablet (2 mg total)  by mouth daily before breakfast. 03/18/16   [name] Agreste, MD  irbesartan-hydrochlorothiazide (AVALIDE) 300-12.5 MG tablet Take 1 tablet by mouth daily. 05/06/17   [provider]  LORazepam (ATIVAN) 0.5 MG tablet Take 0.5 mg by mouth every 8 (eight) hours as needed for anxiety.     [provider]  metFORMIN (GLUCOPHAGE-XR) 500 MG 24 hr tablet Take 1 tablet by mouth daily. 04/21/17   [provider]  Naproxen-Esomeprazole (VIMOVO) 500-20 MG TBEC Take by mouth daily.    [provider]  Vitamin D, Ergocalciferol, (DRISDOL) 50000 units CAPS capsule Take 1 capsule by mouth 3 (three) times a week. 02/03/16    [provider]   Social History   Social History  . Marital status: Married    Spouse name: N/A  . Number of children: N/A  . 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 Smoking status: Current Every Day Smoker    Types: Cigarettes  . Smokeless tobacco: Never Used  . Alcohol use No  . Drug use: No  . Sexual activity: Not on file   Other Topics Concern  . Not on file   Social History Narrative  . No narrative on file   Review of Systems  As above in HPI    Objective:   Physical Exam  Constitutional: He is oriented to person, place, and time. He appears well-developed and well-nourished. No distress.  Obese/overweight.   HENT:  Head: Normocephalic and atraumatic.  Cardiovascular: Normal rate.   Pulmonary/Chest: Effort normal.  Neurological: He is alert and oriented to person, place, and time.  Skin: Skin is warm and dry.  Psychiatric: He has a normal mood and affect. His behavior is normal.   Vitals:   05/18/17 1053  BP: (!) 135/94  Pulse: 88  Resp: 16  SpO2: 98%  Weight: (!) 318 lb (144.2 kg)  Height: 6\' 2"  (1.88 m)      Assessment & Plan:  [name] Gregory is a 46 y.o. male OSA on CPAP - Plan: Ambulatory referral to Sleep Studies  - Refer to sleep studies for CPAP titration.   Asked that he follow-up to discuss other vitamin supplements or any specific deficiencies, but would recommend he bring the supplement with him so we can look further into that issue.  No orders of the defined types were placed in this encounter.  Patient Instructions   Please follow-up to discuss vitamin supplement or deficiencies to determine if repeat testing is needed or any change in medications.  I placed a referral for CPAP titration.    IF you received an x-ray today, you will receive an invoice from [facility]. Please contact [facility] at [phone] with questions or  concerns regarding your invoice.   IF you received labwork today, you will receive an invoice from [place]. Please contact [facility] at [phone] with questions or concerns regarding your invoice.   Our billing staff will not be able to assist you with questions regarding bills from these companies.  You will be contacted with the lab results as soon as they are available. The fastest way to get your results is to activate your My Chart account. Instructions are located on the last page of this paperwork. If you have not heard from [place] regarding the results in 2 weeks, please contact this office.       I personally performed the services described in  this documentation, which was scribed in my presence. The recorded information has been reviewed and considered for accuracy and completeness, addended by me as needed, and agree with information above.  Signed,   Merri Ray, MD Primary Care at [facility].  05/20/17 10:34 PM

## 2017-05-21 NOTE — Telephone Encounter (Signed)
Received fax from [facility]. HIDA approved. PA# [id number], 05/18/17-07/02/17.

## 2017-05-23 ENCOUNTER — Encounter: Payer: Self-pay | Admitting: Gastroenterology

## 2017-05-23 NOTE — Progress Notes (Signed)
cc'ed to pcp °

## 2017-05-23 NOTE — Assessment & Plan Note (Signed)
46 year old gentleman with intermittent right upper quadrant pain associated with nausea. Complete workup as outlined including EGD, colonoscopy, extensive imaging as outlined previously. He has history of hemangioma which is stable and the liver. Recent CT imaging as outlined above. Have not excluded to biliary dyskinesia. Remains on PPI for reflux esophagitis and gastritis.  Mild microcytic anemia, previous iron studies last year were unremarkable. Plan to check I FOBT. Previously had mildly elevated sedimentation rate last year. Await HIDA scan, if no significant findings would consider repeating CBC, iron labs, sedimentation rate.

## 2017-05-24 ENCOUNTER — Encounter (HOSPITAL_COMMUNITY): Payer: Self-pay

## 2017-05-24 ENCOUNTER — Encounter (HOSPITAL_COMMUNITY)
Admission: RE | Admit: 2017-05-24 | Discharge: 2017-05-24 | Disposition: A | Discharge status: Home / Self Care | Payer: 59 | Source: Ambulatory Visit | Attending: Gastroenterology | Admitting: Gastroenterology

## 2017-05-24 DIAGNOSIS — R1011 Right upper quadrant pain: Secondary | ICD-10-CM | POA: Insufficient documentation

## 2017-05-24 MED ORDER — SINCALIDE 5 MCG IJ SOLR
INTRAMUSCULAR | Status: AC
  Administered 2017-05-24: 2.91 ug
  Filled 2017-05-24: qty 5

## 2017-05-24 MED ORDER — TECHNETIUM TC 99M MEBROFENIN IV KIT
5.0000 | PACK | Freq: Once | INTRAVENOUS | Status: AC | PRN
  Administered 2017-05-24: 5.2 via INTRAVENOUS

## 2017-05-24 MED ORDER — STERILE WATER FOR INJECTION IJ SOLN
INTRAMUSCULAR | Status: AC
  Administered 2017-05-24: 2.91 mL
  Filled 2017-05-24: qty 10

## 2017-05-31 ENCOUNTER — Ambulatory Visit: Payer: 59 | Admitting: Family Medicine

## 2017-06-01 ENCOUNTER — Other Ambulatory Visit: Payer: Self-pay | Admitting: Nurse Practitioner

## 2017-06-04 NOTE — Progress Notes (Signed)
GB function test normal. Remind patient to return ifobt.

## 2017-06-06 NOTE — Progress Notes (Signed)
Patient made aware of results and stated he will return ifobt

## 2017-06-14 ENCOUNTER — Ambulatory Visit (INDEPENDENT_AMBULATORY_CARE_PROVIDER_SITE_OTHER): Payer: 59 | Admitting: Pulmonary Disease

## 2017-06-14 ENCOUNTER — Encounter: Payer: Self-pay | Admitting: Pulmonary Disease

## 2017-06-14 DIAGNOSIS — Z9989 Dependence on other enabling machines and devices: Secondary | ICD-10-CM | POA: Diagnosis not present

## 2017-06-14 DIAGNOSIS — G4733 Obstructive sleep apnea (adult) (pediatric): Secondary | ICD-10-CM | POA: Diagnosis not present

## 2017-06-14 NOTE — Progress Notes (Signed)
Subjective:    Patient ID: [name] Gregory, male    DOB: 1970-09-21, 46 y.o.   MRN: [id number]  HPI  46 year old obese man presents for evaluation and management of obstructive sleep apnea. 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 He was diagnosed with sleep apnea in 2005 after a sleep study done 1110 and has been maintained on CPAP with full facemask since then by [employer].  Thinks his pressure is at 12 cm and he does not change the mask in the last 13 years! He reports some improvement in his daytime fatigue but some degree of somnolence and fatigue persistent. He was seen by his PCP recently and a home sleep study was done which showed severe OSA with AHI of 62/L and lowest desaturation of 70% found it very difficult to sleep with a CPAP for that one night.  He reports good compliance with his machine.  His PCP is sending a prescription to DME but settings were not clarified and were unable to get him a new machine .   Epworth sleepiness score is 16 and reports sleepiness in various social situations such as a passenger in a car, watching TV, reading.  Bedtime is between midnight and 1 AM, sleep latency is minimal, sleeps on his back, side with 2 pillows, reports 5-7 nocturnal awakenings including nocturia and is out of bed by 8:30 AM feeling tired with occasional dryness of mouth denies a headache. He states that he had minimal 100 pounds lighter about 5 years ago he continued to have severe OSA type symptoms.  He is now at his heaviest at 315 pounds.  There is no history suggestive of cataplexy, sleep paralysis or parasomnias  He has difficult to control hypertension requiring 4 medications, degenerative disc disease requiring Neurontin. Both his parents had OSA.  Brother died at 58 of congestive heart failure and also had OSA     Significant tests/ events reviewed  03/2017 NPSG >> severe , AHI 62/h, lowest desatn 70 %    Past Medical History:    Diagnosis Date  . Complication of anesthesia    WOKE UP DURING SURGERY  . Diabetes ([place])   . Hemangioma of liver   . Hypertension   . Sleep apnea      Past Surgical History:  Procedure Laterality Date  . BIOPSY  05/04/2016   Procedure: BIOPSY;  Surgeon: Daneil Dolin, MD;  [facility];  Service: Endoscopy;;  gastric bx  . COLONOSCOPY WITH PROPOFOL N/A 05/04/2016   Procedure: COLONOSCOPY WITH PROPOFOL;  Surgeon: Daneil Dolin, MD;  [facility];  Service: Endoscopy;  Laterality: N/A;  9:45 AM  . ESOPHAGOGASTRODUODENOSCOPY (EGD) WITH PROPOFOL N/A 05/04/2016   Procedure: ESOPHAGOGASTRODUODENOSCOPY (EGD) WITH PROPOFOL;  Surgeon: Daneil Dolin, MD;  [facility];  Service: Endoscopy;  Laterality: N/A;  . EYE SURGERY    . SALIVARY GLAND SURGERY  1992    No Known Allergies   Social History   Social History  . Marital status: Married    Spouse name: N/A  . Number of children: N/A  . 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 Smoking status: Current Every Day Smoker    Packs/day: 0.25    Years: 36.00    Types: Cigarettes  . Smokeless tobacco: Never Used  . Alcohol use No  . Drug use:  No  . Sexual activity: Not on file   Other Topics Concern  . Not on file   Social History Narrative  . No narrative on file     Family History  Problem Relation Age of Onset  . Hypertension Mother   . CVA Father   . Diabetes Father   . Throat cancer Father   . Heart disease Brother 9  . Cervical cancer Maternal Grandmother   . Throat cancer Maternal Grandmother   . Throat cancer Maternal Grandfather   . Bladder Cancer Maternal Grandfather   . Colon cancer Neg Hx      Review of Systems Constitutional: negative for anorexia, fevers and sweats  Eyes: negative for irritation, redness and visual disturbance  Ears, nose, mouth, throat, and face: negative for earaches,  epistaxis, nasal congestion and sore throat  Respiratory: negative for cough, dyspnea on exertion, sputum and wheezing  Cardiovascular: negative for chest pain, dyspnea, lower extremity edema, orthopnea, palpitations and syncope  Gastrointestinal: negative for abdominal pain, constipation, diarrhea, melena, nausea and vomiting  Genitourinary:negative for dysuria, frequency and hematuria  Hematologic/lymphatic: negative for bleeding, easy bruising and lymphadenopathy  Musculoskeletal:negative for arthralgias, muscle weakness and stiff joints  Neurological: negative for coordination problems, gait problems, headaches and weakness  Endocrine: negative for diabetic symptoms including polydipsia, polyuria and weight loss     Objective:   Physical Exam  Gen. Pleasant, obese, in no distress, normal affect ENT - enlarged tonsillar tissue, no post nasal drip, class 2-3 airway Neck: No JVD, no thyromegaly, no carotid bruits Lungs: no use of accessory muscles, no dullness to percussion, decreased without rales or rhonchi  Cardiovascular: Rhythm regular, heart sounds  normal, no murmurs or gallops, no peripheral edema Abdomen: soft and non-tender, no hepatosplenomegaly, BS normal. Musculoskeletal: No deformities, no cyanosis or clubbing Neuro:  alert, non focal, no tremors        Assessment & Plan:

## 2017-06-14 NOTE — Patient Instructions (Signed)
Prescription for auto CPAP 5-20 cm with new full face mask of choice, humidity [facility] Download in 4 weeks

## 2017-06-14 NOTE — Assessment & Plan Note (Signed)
Weight loss encouraged 

## 2017-06-14 NOTE — Assessment & Plan Note (Signed)
Prescription for auto CPAP 5-20 cm with new full face mask of choice, humidity [facility] to [facility]. Download in 4 weeks  Weight loss encouraged, compliance with goal of at least 4-6 hrs every night is the expectation. Advised against medications with sedative side effects Cautioned against driving when sleepy - understanding that sleepiness will vary on a day to day basis   If he remains sleepy in spite of adjustment of pressure and good compliance, then we will consider stimulant we may also help investigate for narcolepsy if persistent symptoms.  The pathophysiology of obstructive sleep apnea , it's cardiovascular consequences & modes of treatment including CPAP were discused with the patient in detail & they evidenced understanding.

## 2017-07-17 ENCOUNTER — Encounter: Payer: Self-pay | Admitting: Pulmonary Disease

## 2017-07-19 ENCOUNTER — Encounter: Payer: Self-pay | Admitting: Pulmonary Disease

## 2017-07-19 ENCOUNTER — Ambulatory Visit: Payer: 59 | Admitting: Pulmonary Disease

## 2017-07-19 VITALS — BP 140/90 | HR 78 | Ht 74.0 in | Wt 313.2 lb

## 2017-07-19 DIAGNOSIS — G4733 Obstructive sleep apnea (adult) (pediatric): Secondary | ICD-10-CM

## 2017-07-19 NOTE — Assessment & Plan Note (Signed)
Weight loss encouraged 

## 2017-07-19 NOTE — Assessment & Plan Note (Signed)
Change CPAP settings to auto 10-15 cm Average pressure is 13 cm. He does have moderate leak and I have asked him to tighten his straps  CPAP supplies will be renewed for a year. Weight loss encouraged, compliance with goal of at least 4-6 hrs every night is the expectation. Advised against medications with sedative side effects Cautioned against driving when sleepy - understanding that sleepiness will vary on a day to day basis

## 2017-07-19 NOTE — Progress Notes (Signed)
   Subjective:    Patient ID: [name] Gregory, male    DOB: 1971-03-01, 46 y.o.   MRN: [id number]  HPI  46 year old obese man for FU of obstructive sleep apnea. 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 DME - [employer].   He was provided with a new CPAP with auto settings on his last visit a month ago.  He really likes the new machine.  He has adjusted well to full facemask.  Is able to use this every night and reports good improvement in his daytime somnolence and fatigue.  Wife has not noted any snoring or leak.  Download was reviewed and this shows excellent control of events on auto settings with average pressure of 13 cm with large leak  He has hypertension controlled with 4 medications  Significant tests/ events reviewed  03/2017 NPSG >> severe , AHI 62/h, lowest desatn 70 %  Review of Systems Patient denies significant dyspnea,cough, hemoptysis,  chest pain, palpitations, pedal edema, orthopnea, paroxysmal nocturnal dyspnea, lightheadedness, nausea, vomiting, abdominal or  leg pains      Objective:   Physical Exam   Gen. Pleasant, obese, in no distress ENT - no lesions, no post nasal drip Neck: No JVD, no thyromegaly, no carotid bruits Lungs: no use of accessory muscles, no dullness to percussion, decreased without rales or rhonchi  Cardiovascular: Rhythm regular, heart sounds  normal, no murmurs or gallops, no peripheral edema Musculoskeletal: No deformities, no cyanosis or clubbing , no tremors        Assessment & Plan:

## 2017-07-19 NOTE — Patient Instructions (Signed)
Change CPAP settings to auto 10-15 cm

## 2017-07-30 ENCOUNTER — Encounter: Payer: Self-pay | Admitting: Pulmonary Disease

## 2017-08-24 ENCOUNTER — Ambulatory Visit: Payer: 59 | Admitting: Podiatry

## 2017-09-04 ENCOUNTER — Encounter: Payer: 59 | Admitting: Podiatry

## 2017-09-04 ENCOUNTER — Encounter: Payer: Self-pay | Admitting: Podiatry

## 2017-09-05 NOTE — Progress Notes (Signed)
No show

## 2017-10-15 ENCOUNTER — Ambulatory Visit: Payer: 59 | Admitting: Pulmonary Disease

## 2017-10-22 ENCOUNTER — Encounter: Payer: Self-pay | Admitting: Adult Health

## 2017-10-22 ENCOUNTER — Ambulatory Visit: Payer: 59 | Admitting: Adult Health

## 2017-10-22 DIAGNOSIS — Z9989 Dependence on other enabling machines and devices: Secondary | ICD-10-CM

## 2017-10-22 DIAGNOSIS — G4733 Obstructive sleep apnea (adult) (pediatric): Secondary | ICD-10-CM | POA: Diagnosis not present

## 2017-10-22 NOTE — Assessment & Plan Note (Signed)
Doing well   Plan  . Patient Instructions  Keep up the good work Continue on CPAP at bedtime Work on healthy weight Do not drive a sleepy Follow-up in 1 year with Dr. Elsworth Soho and as needed

## 2017-10-22 NOTE — Addendum Note (Signed)
Addended by: Parke Poisson E on: 10/22/2017 05:29 PM   Modules accepted: Orders

## 2017-10-22 NOTE — Progress Notes (Signed)
@Patient  ID: [name] Gregory, male    DOB: [birth year], 47 y.o.   MRN: [id number]  No chief complaint on file.   Referring provider: Foye Spurling, MD  HPI: 47 year old male followed for severe obstructive sleep apnea on CPAP at bedtime  Significant tests/ events reviewed  03/2017 NPSG >> severe , AHI 62/h, lowest desatn 70 %  10/22/2017 Follow up ; OSA  Patient presents for a follow-up for sleep apnea remains on CPAP at bedtime.  Patient says he feels rested with no significant daytime sleepiness. Download shows excellent compliance with average usage at 9.5 hours.  Patient is on CPAP AutoSet 10-15 cm H2O.AHI 1.4.  Positive leaks   No Known Allergies  Immunization History  Administered Date(s) Administered  . Influenza Split 05/14/2016    Past Medical History:  Diagnosis Date  . Complication of anesthesia    WOKE UP DURING SURGERY  . Diabetes ([place])   . Hemangioma of liver   . Hypertension   . Sleep apnea     Tobacco History: Social History   Tobacco Use  Smoking Status Current Every Day Smoker  . Packs/day: 0.25  . Years: 36.00  . Pack years: 9.00  . Types: Cigarettes  Smokeless Tobacco Never Used  Tobacco Comment   4cigs a day as of 07/19/17 ep   Ready to quit: Not Answered Counseling given: Not Answered Comment: 4cigs a day as of 07/19/17 ep   Outpatient Encounter Medications as of 10/22/2017  Medication Sig  . amLODipine (NORVASC) 10 MG tablet Take 10 mg by mouth daily.  Marland Kitchen aspirin 81 MG tablet Take 81 mg by mouth daily.  Marland Kitchen buPROPion (ZYBAN) 150 MG 12 hr tablet Take 150 mg by mouth 2 (two) times daily.  . carvedilol (COREG) 25 MG tablet Take 25 mg by mouth 2 (two) times daily with a meal.  . citalopram (CELEXA) 20 MG tablet Take 1 tablet by mouth daily.  Marland Kitchen DEXILANT 60 MG capsule TAKE ONE CAPSULE BY MOUTH DAILY  . gabapentin (NEURONTIN) 300 MG capsule Take 300 mg by mouth 2 (two) times daily. Take one capsule by mouth in the morning and two at bedtime for  nerve pain   . glimepiride (AMARYL) 2 MG tablet Take 1 tablet (2 mg total) by mouth daily before breakfast.  . irbesartan-hydrochlorothiazide (AVALIDE) 300-12.5 MG tablet Take 1 tablet by mouth daily.  Marland Kitchen LORazepam (ATIVAN) 0.5 MG tablet Take 0.5 mg by mouth every 8 (eight) hours as needed for anxiety.   . metFORMIN (GLUCOPHAGE-XR) 500 MG 24 hr tablet Take 1 tablet by mouth daily.  . Naproxen-Esomeprazole (VIMOVO) 500-20 MG TBEC Take by mouth daily.  Marland Kitchen oseltamivir (TAMIFLU) 75 MG capsule   . valsartan-hydrochlorothiazide (DIOVAN-HCT) 160-25 MG tablet Take by mouth.  . Vitamin D, Ergocalciferol, (DRISDOL) 50000 units CAPS capsule Take 1 capsule by mouth 3 (three) times a week.   No facility-administered encounter medications on file as of 10/22/2017.      Review of Systems  Constitutional:   No  weight loss, night sweats,  Fevers, chills, fatigue, or  lassitude.  HEENT:   No headaches,  Difficulty swallowing,  Tooth/dental problems, or  Sore throat,                No sneezing, itching, ear ache, nasal congestion, post nasal drip,   CV:  No chest pain,  Orthopnea, PND, swelling in lower extremities, anasarca, dizziness, palpitations, syncope.   GI  No heartburn, indigestion, abdominal pain, nausea, vomiting,  diarrhea, change in bowel habits, loss of appetite, bloody stools.   Resp: No shortness of breath with exertion or at rest.  No excess mucus, no productive cough,  No non-productive cough,  No coughing up of blood.  No change in color of mucus.  No wheezing.  No chest wall deformity  Skin: no rash or lesions.  GU: no dysuria, change in color of urine, no urgency or frequency.  No flank pain, no hematuria   MS:  No joint pain or swelling.  No decreased range of motion.  No back pain.    Physical Exam  There were no vitals taken for this visit.  GEN: A/Ox3; pleasant , NAD, well nourished    HEENT:  [place]/AT,  EACs-clear, TMs-wnl, NOSE-clear, THROAT-clear, no lesions, no postnasal  drip or exudate noted.   NECK:  Supple w/ fair ROM; no JVD; normal carotid impulses w/o bruits; no thyromegaly or nodules palpated; no lymphadenopathy.    RESP  Clear  P & A; w/o, wheezes/ rales/ or rhonchi. no accessory muscle use, no dullness to percussion  CARD:  RRR, no m/r/g, no peripheral edema, pulses intact, no cyanosis or clubbing.  GI:   Soft & nt; nml bowel sounds; no organomegaly or masses detected.   Musco: Warm bil, no deformities or joint swelling noted.   Neuro: alert, no focal deficits noted.    Skin: Warm, no lesions or rashes    Lab Results:  CBC    Component Value Date/Time   WBC 12.5 (H) 03/31/2017 1211   RBC 5.33 03/31/2017 1211   HGB 12.5 (L) 03/31/2017 1211   HCT 39.4 03/31/2017 1211   PLT 337 03/31/2017 1211   MCV 73.9 (L) 03/31/2017 1211   MCV 72.6 (A) 03/18/2016 1400   MCH 23.5 (L) 03/31/2017 1211   MCHC 31.7 03/31/2017 1211   RDW 13.8 03/31/2017 1211   LYMPHSABS 2,727 04/24/2016 1102   MONOABS 606 04/24/2016 1102   EOSABS 303 04/24/2016 1102   BASOSABS 0 04/24/2016 1102    BMET    Component Value Date/Time   NA 136 03/31/2017 1211   K 4.4 03/31/2017 1211   CL 104 03/31/2017 1211   CO2 26 03/31/2017 1211   GLUCOSE 185 (H) 03/31/2017 1211   BUN 13 03/31/2017 1211   CREATININE 0.63 03/31/2017 1211   CREATININE 0.72 03/18/2016 1352   CALCIUM 9.1 03/31/2017 1211   GFRNONAA >60 03/31/2017 1211   GFRNONAA >89 03/18/2016 1352   GFRAA >60 03/31/2017 1211   GFRAA >89 03/18/2016 1352    BNP No results found for: BNP  ProBNP    Component Value Date/Time   PROBNP <30.0 12/16/2008 0340    Imaging: No results found.   Assessment & Plan:   No problem-specific Assessment & Plan notes found for this encounter.     Rexene Edison, NP 10/22/2017

## 2017-10-22 NOTE — Patient Instructions (Signed)
Keep up the good work Continue on CPAP at bedtime Work on healthy weight Do not drive a sleepy Follow-up in 1 year with Dr. Elsworth Soho and as needed

## 2017-10-22 NOTE — Assessment & Plan Note (Signed)
Wt loss  

## 2018-01-06 ENCOUNTER — Emergency Department (HOSPITAL_COMMUNITY): Payer: BLUE CROSS/BLUE SHIELD

## 2018-01-06 ENCOUNTER — Emergency Department (HOSPITAL_COMMUNITY)
Admission: EM | Admit: 2018-01-06 | Discharge: 2018-01-06 | Disposition: A | Discharge status: Home / Self Care | Payer: BLUE CROSS/BLUE SHIELD | Attending: Emergency Medicine | Admitting: Emergency Medicine

## 2018-01-06 ENCOUNTER — Other Ambulatory Visit: Payer: Self-pay

## 2018-01-06 ENCOUNTER — Encounter (HOSPITAL_COMMUNITY): Payer: Self-pay | Admitting: Emergency Medicine

## 2018-01-06 DIAGNOSIS — J209 Acute bronchitis, unspecified: Secondary | ICD-10-CM | POA: Diagnosis not present

## 2018-01-06 DIAGNOSIS — I10 Essential (primary) hypertension: Secondary | ICD-10-CM | POA: Insufficient documentation

## 2018-01-06 DIAGNOSIS — Z7982 Long term (current) use of aspirin: Secondary | ICD-10-CM | POA: Insufficient documentation

## 2018-01-06 DIAGNOSIS — Z7984 Long term (current) use of oral hypoglycemic drugs: Secondary | ICD-10-CM | POA: Insufficient documentation

## 2018-01-06 DIAGNOSIS — Z79899 Other long term (current) drug therapy: Secondary | ICD-10-CM | POA: Insufficient documentation

## 2018-01-06 DIAGNOSIS — F17210 Nicotine dependence, cigarettes, uncomplicated: Secondary | ICD-10-CM | POA: Insufficient documentation

## 2018-01-06 DIAGNOSIS — R05 Cough: Secondary | ICD-10-CM | POA: Diagnosis present

## 2018-01-06 HISTORY — DX: Acute upper respiratory infection, unspecified: J06.9

## 2018-01-06 LAB — BASIC METABOLIC PANEL
ANION GAP: 8 (ref 5–15)
BUN: 10 mg/dL (ref 6–20)
CHLORIDE: 100 mmol/L — AB (ref 101–111)
CO2: 27 mmol/L (ref 22–32)
CREATININE: 0.6 mg/dL — AB (ref 0.61–1.24)
Calcium: 9 mg/dL (ref 8.9–10.3)
GFR calc non Af Amer: >60 mL/min (ref >60)
Glucose, Bld: 278 mg/dL — ABNORMAL HIGH (ref 65–99)
Potassium: 4.1 mmol/L (ref 3.5–5.1)
SODIUM: 135 mmol/L (ref 135–145)

## 2018-01-06 LAB — CBC WITH DIFFERENTIAL/PLATELET
BASOS ABS: 0.1 10*3/uL (ref 0.0–0.1)
Basophils Relative: 1 %
EOS ABS: 0.5 10*3/uL (ref 0.0–0.7)
Eosinophils Relative: 4 %
HCT: 41.4 % (ref 39.0–52.0)
HEMOGLOBIN: 13.5 g/dL (ref 13.0–17.0)
Lymphocytes Relative: 24 %
Lymphs Abs: 2.8 10*3/uL (ref 0.7–4.0)
MCH: 24.2 pg — ABNORMAL LOW (ref 26.0–34.0)
MCHC: 32.6 g/dL (ref 30.0–36.0)
MCV: 74.3 fL — ABNORMAL LOW (ref 78.0–100.0)
MONOS PCT: 7 %
Monocytes Absolute: 0.8 10*3/uL (ref 0.1–1.0)
NEUTROS ABS: 7.4 10*3/uL (ref 1.7–7.7)
NEUTROS PCT: 64 %
Platelets: 285 10*3/uL (ref 150–400)
RBC: 5.57 MIL/uL (ref 4.22–5.81)
RDW: 12.8 % (ref 11.5–15.5)
WBC: 11.6 10*3/uL — AB (ref 4.0–10.5)

## 2018-01-06 MED ORDER — ALBUTEROL SULFATE HFA 108 (90 BASE) MCG/ACT IN AERS
2.0000 | INHALATION_SPRAY | RESPIRATORY_TRACT | Status: AC
  Administered 2018-01-06: 2 via RESPIRATORY_TRACT
  Filled 2018-01-06: qty 6.7

## 2018-01-06 MED ORDER — ALBUTEROL (5 MG/ML) CONTINUOUS INHALATION SOLN
10.0000 mg/h | INHALATION_SOLUTION | RESPIRATORY_TRACT | Status: AC
  Administered 2018-01-06: 10 mg/h via RESPIRATORY_TRACT
  Filled 2018-01-06: qty 20

## 2018-01-06 MED ORDER — ALBUTEROL SULFATE HFA 108 (90 BASE) MCG/ACT IN AERS: 2.0000 | INHALATION_SPRAY | RESPIRATORY_TRACT | 3 refills | Status: DC | PRN

## 2018-01-06 MED ORDER — BENZONATATE 100 MG PO CAPS: 100.0000 mg | ORAL_CAPSULE | Freq: Three times a day (TID) | ORAL | 0 refills | Status: DC

## 2018-01-06 MED ORDER — IPRATROPIUM-ALBUTEROL 0.5-2.5 (3) MG/3ML IN SOLN
3.0000 mL | Freq: Once | RESPIRATORY_TRACT | Status: AC
  Administered 2018-01-06: 3 mL via RESPIRATORY_TRACT
  Filled 2018-01-06: qty 3

## 2018-01-06 MED ORDER — AEROCHAMBER Z-STAT PLUS/MEDIUM MISC
1.0000 | Freq: Once | Status: AC
  Administered 2018-01-06: 1
  Filled 2018-01-06 (×2): qty 1

## 2018-01-06 MED ORDER — PREDNISONE 20 MG PO TABS
40.0000 mg | ORAL_TABLET | Freq: Once | ORAL | Status: AC
  Administered 2018-01-06: 40 mg via ORAL
  Filled 2018-01-06: qty 2

## 2018-01-06 MED ORDER — ALBUTEROL SULFATE (2.5 MG/3ML) 0.083% IN NEBU
2.5000 mg | INHALATION_SOLUTION | Freq: Once | RESPIRATORY_TRACT | Status: AC
  Administered 2018-01-06: 2.5 mg via RESPIRATORY_TRACT
  Filled 2018-01-06: qty 3

## 2018-01-06 NOTE — ED Triage Notes (Signed)
Patient c/o productive cough with fever since last Sunday. Sputum thick and dark in color per patient. Patient seen by Urgent Care on Tuesday and diagnosed with URI ad sinus infection Per patient was told x-ray showed ? beginning of pneumonia. Patient given z-pack and prednisone. Patient states taken robitussin DM and tylenol with prescribed medication and is progressively getting worse. Patient also c/o left ear pain without drainage.

## 2018-01-06 NOTE — Discharge Instructions (Signed)
Your testing shows no signs of pneumonia, your lungs are very clear  Please take your medications exactly as prescribed by the urgent care, finish any steroids that may be left, antibiotics that may be left and start taking albuterol 2 puffs every 4 hours as needed and Tessalon 100 mg every 8 hours as needed for coughing\  He will need to be seen by your family doctor within 3 or 4 days if no improvement, see the list below if you do not have a family doctor or return to the emergency department for severe or worsening symptoms   [facility] Doctor List    Sinda Du MD. Specialty: Pulmonary Disease Contact information: [place]  [place] [address]  [phone]   Tula Nakayama, MD. Specialty: Family Medicine Contact information: [address] [place]  [place] [address]  [phone]   Sallee Lange, MD. Specialty: Family Medicine Contact information: [place]  [place] [address]  [phone]   Rosita Fire, MD Specialty: Internal Medicine Contact information: [place] [address]  [phone]   Delphina Cahill, MD. Specialty: Internal Medicine Contact information: [place] [address]  [phone]    [facility] (Dr. Maudie Mercury) Specialty: Family Medicine Contact information: [place] [address]  [phone]   Leslie Andrea, MD. Specialty: Family Medicine Contact information: [place] [address]  [phone]   Asencion Noble, MD. Specialty: Internal Medicine Contact information: [address]  [place] [address]  [place]  [address] [place], [place] [address] [phone]  Services [facility] offers a variety of basic health services.  Services include but are not limited to: Blood  pressure checks  Heart rate checks  Blood sugar checks  Urine analysis  Rapid strep tests  Pregnancy tests.  Health education and referrals  People needing more complex services will be directed to a physician online. Using these virtual visits, doctors can evaluate and prescribe medicine and treatments. There will be no medication on-site, though [facility] will help patients fill their prescriptions at little to no cost.   For More information please go to: [email or web address]

## 2018-01-06 NOTE — ED Notes (Signed)
RT at bedside to administer nebs

## 2018-01-06 NOTE — ED Provider Notes (Signed)
[facility] EMERGENCY DEPARTMENT Provider Note   CSN: [id number] Arrival date & time: 01/06/18  1322     History   Chief Complaint Chief Complaint  Patient presents with  . Cough    HPI [name] Gregory is a 47 y.o. male.  HPI  The pt is a very pleasant 47 y/o male with hx of htn and DM - presents with cough for the last week - was seen at UC 5 days ago and had xray - dx with sinusitis and started on antibiotics - prednisone and yet he has not improved - in fact today he is worsened with increased coughing, wheezing, sweating, has pain to the L side fo the chest at the top of the chest.  He continues to be symptomatic, nothing makes better - pain in the L chest seems to be a bit worse with laying down.  No hx of asthma - he does smoke tobacco though not this week.  Past Medical History:  Diagnosis Date  . Complication of anesthesia    WOKE UP DURING SURGERY  . Diabetes ([place])   . Hemangioma of liver   . Hypertension   . Sleep apnea   . URI (upper respiratory infection)     Patient Active Problem List   Diagnosis Date Noted  . OSA on CPAP 06/14/2017  . Morbid (severe) obesity due to excess calories ([place]) 06/14/2017  . Abdominal pain, epigastric 04/24/2016  . RUQ pain 04/24/2016  . Microcytic anemia 04/24/2016  . Diarrhea 04/24/2016  . Elevated sed rate 04/24/2016  . Splenomegaly 04/24/2016    Past Surgical History:  Procedure Laterality Date  . BIOPSY  05/04/2016   Procedure: BIOPSY;  Surgeon: Daneil Dolin, MD;  [facility];  Service: Endoscopy;;  gastric bx  . COLONOSCOPY WITH PROPOFOL N/A 05/04/2016   Procedure: COLONOSCOPY WITH PROPOFOL;  Surgeon: Daneil Dolin, MD;  Location[facility];  Service: Endoscopy;  Laterality: N/A;  9:45 AM  . ESOPHAGOGASTRODUODENOSCOPY (EGD) WITH PROPOFOL N/A 05/04/2016   Procedure: ESOPHAGOGASTRODUODENOSCOPY (EGD) WITH PROPOFOL;  Surgeon: Daneil Dolin, MD;  Location[facility];  Service: Endoscopy;  Laterality:  N/A;  . EYE SURGERY    . SALIVARY GLAND SURGERY  1992        Home Medications    Prior to Admission medications   Medication Sig Start Date End Date Taking? Authorizing Provider  amLODipine (NORVASC) 10 MG tablet Take 10 mg by mouth daily. 03/11/16  Yes [provider]  aspirin 81 MG tablet Take 81 mg by mouth daily.   Yes [provider]  buPROPion (ZYBAN) 150 MG 12 hr tablet Take 150 mg by mouth 2 (two) times daily.   Yes [provider]  carvedilol (COREG) 25 MG tablet Take 25 mg by mouth 2 (two) times daily with a meal.   Yes [provider]  citalopram (CELEXA) 20 MG tablet Take 1 tablet by mouth daily. 04/24/17  Yes [provider]  gabapentin (NEURONTIN) 300 MG capsule Take 300 mg by mouth 2 (two) times daily. Take one capsule by mouth in the morning and two at bedtime for nerve pain    Yes [provider]  irbesartan-hydrochlorothiazide (AVALIDE) 300-12.5 MG tablet Take 1 tablet by mouth daily. 05/06/17  Yes [provider]  LORazepam (ATIVAN) 0.5 MG tablet Take 0.5 mg by mouth every 8 (eight) hours as needed for anxiety.    Yes [provider]  metFORMIN (GLUCOPHAGE-XR) 500 MG 24 hr  tablet Take 1 tablet by mouth daily. 04/21/17  Yes [provider]  Naproxen-Esomeprazole (VIMOVO) 500-20 MG TBEC Take by mouth daily.   Yes [provider]  Vitamin D, Ergocalciferol, (DRISDOL) 50000 units CAPS capsule Take 1 capsule by mouth 3 (three) times a week. 02/03/16  Yes [provider]  albuterol (PROVENTIL HFA;VENTOLIN HFA) 108 (90 Base) MCG/ACT inhaler Inhale 2 puffs into the lungs every 4 (four) hours as needed for wheezing or shortness of breath. 01/06/18   Noemi Chapel, MD  benzonatate (TESSALON) 100 MG capsule Take 1 capsule (100 mg total) by mouth every 8 (eight) hours. 01/06/18   Noemi Chapel, MD  DEXILANT 60 MG capsule TAKE ONE CAPSULE BY MOUTH DAILY Patient not taking: Reported on 01/06/2018  06/05/17   Carlis Stable, NP  glimepiride (AMARYL) 2 MG tablet Take 1 tablet (2 mg total) by mouth daily before breakfast. Patient not taking: Reported on 01/06/2018 03/18/16   Wendie Agreste, MD    Family History Family History  Problem Relation Age of Onset  . Hypertension Mother   . CVA Father   . Diabetes Father   . Throat cancer Father   . Heart disease Brother 37  . Cervical cancer Maternal Grandmother   . Throat cancer Maternal Grandmother   . Throat cancer Maternal Grandfather   . Bladder Cancer Maternal Grandfather   . Colon cancer Neg Hx     Social History Social History   Tobacco Use  . Smoking status: Current Every Day Smoker    Packs/day: 0.25    Years: 36.00    Pack years: 9.00    Types: Cigarettes  . Smokeless tobacco: Never Used  . Tobacco comment: 4cigs a day as of 07/19/17 ep  Substance Use Topics  . Alcohol use: No  . Drug use: No     Allergies   Patient has no known allergies.   Review of Systems Review of Systems  All other systems reviewed and are negative.    Physical Exam Updated Vital Signs BP (!) 133/96   Pulse (!) 107   Temp 98.3 F (36.8 C) (Oral)   Resp 19   Ht 6\' 2"  (1.88 m)   Wt (!) 138.3 kg (305 lb)   SpO2 99%   BMI 39.16 kg/m   Physical Exam  Constitutional: He appears well-developed and well-nourished. No distress.  HENT:  Head: Normocephalic and atraumatic.  Mouth/Throat: Oropharynx is clear and moist. No oropharyngeal exudate.  Eyes: Pupils are equal, round, and reactive to light. Conjunctivae and EOM are normal. Right eye exhibits no discharge. Left eye exhibits no discharge. No scleral icterus.  Neck: Normal range of motion. Neck supple. No JVD present. No thyromegaly present.  Cardiovascular: Regular rhythm, normal heart sounds and intact distal pulses. Exam reveals no gallop and no friction rub.  No murmur heard. Tachcyardic, normal pulses at radial arteries  Pulmonary/Chest: He is in respiratory distress. He  has wheezes. He has no rales.  Tachypneic, increased work of breathing, prolonged expiratory phase with diffuse expiratory wheezes.  No accessory muscle use, speaks to slightly short and sentences  Abdominal: Soft. Bowel sounds are normal. He exhibits no distension and no mass. There is no tenderness.  Musculoskeletal: Normal range of motion. He exhibits no edema or tenderness.  Lymphadenopathy:    He has no cervical adenopathy.  Neurological: He is alert. Coordination normal.  Skin: Skin is warm. No rash noted. He is diaphoretic. No erythema.  Psychiatric: He has a normal mood  and affect. His behavior is normal.  Nursing note and vitals reviewed.    ED Treatments / Results  Labs (all labs ordered are listed, but only abnormal results are displayed) Labs Reviewed  CBC WITH DIFFERENTIAL/PLATELET - Abnormal; Notable for the following components:      Result Value   WBC 11.6 (*)    MCV 74.3 (*)    MCH 24.2 (*)    All other components within normal limits  BASIC METABOLIC PANEL - Abnormal; Notable for the following components:   Chloride 100 (*)    Glucose, Bld 278 (*)    Creatinine, Ser 0.60 (*)    All other components within normal limits    EKG None  Radiology Dg Chest 2 View  Result Date: 01/06/2018 CLINICAL DATA:  Cough, shortness of breath and wheezing. EXAM: CHEST - 2 VIEW COMPARISON:  09/18/2014 FINDINGS: The heart size and mediastinal contours are within normal limits. Both lungs are clear. The visualized skeletal structures are unremarkable. IMPRESSION: No active cardiopulmonary disease. Electronically Signed   By: Kerby Moors M.D.   On: 01/06/2018 14:11    Procedures Procedures (including critical care time)  Medications Ordered in ED Medications  albuterol (PROVENTIL,VENTOLIN) solution continuous neb (10 mg/hr Nebulization New Bag/Given 01/06/18 1356)  albuterol (PROVENTIL HFA;VENTOLIN HFA) 108 (90 Base) MCG/ACT inhaler 2 puff (has no administration in time  range)  AEROCHAMBER PLUS FLO-VU MEDIUM MISC 1 each (has no administration in time range)  ipratropium-albuterol (DUONEB) 0.5-2.5 (3) MG/3ML nebulizer solution 3 mL (3 mLs Nebulization Given 01/06/18 1348)  albuterol (PROVENTIL) (2.5 MG/3ML) 0.083% nebulizer solution 2.5 mg (2.5 mg Nebulization Given 01/06/18 1348)  predniSONE (DELTASONE) tablet 40 mg (40 mg Oral Given 01/06/18 1422)     Initial Impression / Assessment and Plan / ED Course  I have reviewed the triage vital signs and the nursing notes.  Pertinent labs & imaging results that were available during my care of the patient were reviewed by me and considered in my medical decision making (see chart for details).  Clinical Course as of Jan 06 1450  Sun Jan 07, 6788  3810 Anabolic panel is unremarkable, mild hyperglycemia.  Blood counts are 11,600 but no leftward shift or predominance of neutrophils consistent with more of a steroid type shift.  Patient given all of his results, given reassurance that his symptoms are likely viral and likely bronchitis, he can finish the medications he was given but I will give him albuterol, as well as a more potent antitussive.   [BM]  1448 MDI given prior to discharge, he was shown how to use this with a spacer by respiratory.   [BM]    Clinical Course User Index [BM] Noemi Chapel, MD   The patient has acute bronchospasm, this may be related to underlying bronchitis and viral process or could be related to a worsening pneumonia.  Given his tachycardia and pulmonary findings he will need an x-ray.  He does not have a fever here, he is not terribly hypoxic (74%).  Albuterol treatment will be started.  He has not been on a neb / MDI at home.  Final Clinical Impressions(s) / ED Diagnoses   Final diagnoses:  Acute bronchitis, unspecified organism    ED Discharge Orders        Ordered    albuterol (PROVENTIL HFA;VENTOLIN HFA) 108 (90 Base) MCG/ACT inhaler  Every 4 hours PRN     01/06/18 1449     benzonatate (TESSALON) 100 MG capsule  Every 8  hours     01/06/18 1449       Noemi Chapel, MD 01/06/18 1451

## 2018-10-24 DIAGNOSIS — I10 Essential (primary) hypertension: Secondary | ICD-10-CM | POA: Diagnosis not present

## 2018-10-24 DIAGNOSIS — J301 Allergic rhinitis due to pollen: Secondary | ICD-10-CM | POA: Diagnosis not present

## 2018-10-24 DIAGNOSIS — R42 Dizziness and giddiness: Secondary | ICD-10-CM | POA: Diagnosis not present

## 2018-10-24 DIAGNOSIS — E119 Type 2 diabetes mellitus without complications: Secondary | ICD-10-CM | POA: Diagnosis not present

## 2018-11-02 DIAGNOSIS — F17200 Nicotine dependence, unspecified, uncomplicated: Secondary | ICD-10-CM | POA: Diagnosis not present

## 2018-11-02 DIAGNOSIS — Z720 Tobacco use: Secondary | ICD-10-CM | POA: Diagnosis not present

## 2018-11-02 DIAGNOSIS — R05 Cough: Secondary | ICD-10-CM | POA: Diagnosis not present

## 2018-11-02 DIAGNOSIS — E1165 Type 2 diabetes mellitus with hyperglycemia: Secondary | ICD-10-CM | POA: Diagnosis not present

## 2018-11-02 DIAGNOSIS — I10 Essential (primary) hypertension: Secondary | ICD-10-CM | POA: Diagnosis not present

## 2018-11-02 DIAGNOSIS — J40 Bronchitis, not specified as acute or chronic: Secondary | ICD-10-CM | POA: Diagnosis not present

## 2018-11-02 DIAGNOSIS — E119 Type 2 diabetes mellitus without complications: Secondary | ICD-10-CM | POA: Diagnosis not present

## 2018-11-02 DIAGNOSIS — J209 Acute bronchitis, unspecified: Secondary | ICD-10-CM | POA: Diagnosis not present

## 2018-11-06 DIAGNOSIS — B342 Coronavirus infection, unspecified: Secondary | ICD-10-CM | POA: Diagnosis not present

## 2018-12-02 DIAGNOSIS — R718 Other abnormality of red blood cells: Secondary | ICD-10-CM | POA: Diagnosis not present

## 2018-12-02 DIAGNOSIS — E119 Type 2 diabetes mellitus without complications: Secondary | ICD-10-CM | POA: Diagnosis not present

## 2018-12-02 DIAGNOSIS — I10 Essential (primary) hypertension: Secondary | ICD-10-CM | POA: Diagnosis not present

## 2018-12-05 DIAGNOSIS — M79645 Pain in left finger(s): Secondary | ICD-10-CM | POA: Diagnosis not present

## 2018-12-05 DIAGNOSIS — E611 Iron deficiency: Secondary | ICD-10-CM | POA: Diagnosis not present

## 2018-12-05 DIAGNOSIS — E559 Vitamin D deficiency, unspecified: Secondary | ICD-10-CM | POA: Diagnosis not present

## 2018-12-05 DIAGNOSIS — E119 Type 2 diabetes mellitus without complications: Secondary | ICD-10-CM | POA: Diagnosis not present

## 2018-12-06 DIAGNOSIS — E559 Vitamin D deficiency, unspecified: Secondary | ICD-10-CM | POA: Diagnosis not present

## 2019-07-08 ENCOUNTER — Encounter (HOSPITAL_COMMUNITY): Payer: Self-pay | Admitting: Emergency Medicine

## 2019-07-08 ENCOUNTER — Emergency Department (HOSPITAL_COMMUNITY): Payer: BC Managed Care – PPO

## 2019-07-08 ENCOUNTER — Other Ambulatory Visit: Payer: Self-pay

## 2019-07-08 ENCOUNTER — Emergency Department (HOSPITAL_COMMUNITY)
Admission: EM | Admit: 2019-07-08 | Discharge: 2019-07-09 | Disposition: A | Discharge status: Home / Self Care | Payer: BC Managed Care – PPO | Attending: Emergency Medicine | Admitting: Emergency Medicine

## 2019-07-08 DIAGNOSIS — F17210 Nicotine dependence, cigarettes, uncomplicated: Secondary | ICD-10-CM | POA: Diagnosis not present

## 2019-07-08 DIAGNOSIS — Z79899 Other long term (current) drug therapy: Secondary | ICD-10-CM | POA: Diagnosis not present

## 2019-07-08 DIAGNOSIS — R1110 Vomiting, unspecified: Secondary | ICD-10-CM | POA: Diagnosis not present

## 2019-07-08 DIAGNOSIS — I10 Essential (primary) hypertension: Secondary | ICD-10-CM | POA: Diagnosis not present

## 2019-07-08 DIAGNOSIS — Z7984 Long term (current) use of oral hypoglycemic drugs: Secondary | ICD-10-CM | POA: Diagnosis not present

## 2019-07-08 DIAGNOSIS — R112 Nausea with vomiting, unspecified: Secondary | ICD-10-CM | POA: Insufficient documentation

## 2019-07-08 DIAGNOSIS — R109 Unspecified abdominal pain: Secondary | ICD-10-CM | POA: Diagnosis not present

## 2019-07-08 DIAGNOSIS — R197 Diarrhea, unspecified: Secondary | ICD-10-CM | POA: Insufficient documentation

## 2019-07-08 DIAGNOSIS — E119 Type 2 diabetes mellitus without complications: Secondary | ICD-10-CM | POA: Insufficient documentation

## 2019-07-08 DIAGNOSIS — Z7982 Long term (current) use of aspirin: Secondary | ICD-10-CM | POA: Diagnosis not present

## 2019-07-08 LAB — COMPREHENSIVE METABOLIC PANEL
ALT: 39 U/L (ref 0–44)
AST: 32 U/L (ref 15–41)
Albumin: 4.1 g/dL (ref 3.5–5.0)
Alkaline Phosphatase: 89 U/L (ref 38–126)
Anion gap: 11 (ref 5–15)
BUN: 11 mg/dL (ref 6–20)
CO2: 27 mmol/L (ref 22–32)
Calcium: 9.2 mg/dL (ref 8.9–10.3)
Chloride: 96 mmol/L — ABNORMAL LOW (ref 98–111)
Creatinine, Ser: 0.73 mg/dL (ref 0.61–1.24)
GFR calc Af Amer: >60 mL/min (ref >60)
GFR calc non Af Amer: >60 mL/min (ref >60)
Glucose, Bld: 310 mg/dL — ABNORMAL HIGH (ref 70–99)
Potassium: 4.8 mmol/L (ref 3.5–5.1)
Sodium: 134 mmol/L — ABNORMAL LOW (ref 135–145)
Total Bilirubin: 1.3 mg/dL — ABNORMAL HIGH (ref 0.3–1.2)
Total Protein: 7.3 g/dL (ref 6.5–8.1)

## 2019-07-08 LAB — CBC WITH DIFFERENTIAL/PLATELET
Abs Immature Granulocytes: 0.02 10*3/uL (ref 0.00–0.07)
Basophils Absolute: 0.1 10*3/uL (ref 0.0–0.1)
Basophils Relative: 1 %
Eosinophils Absolute: 0.1 10*3/uL (ref 0.0–0.5)
Eosinophils Relative: 1 %
HCT: 48 % (ref 39.0–52.0)
Hemoglobin: 15.3 g/dL (ref 13.0–17.0)
Immature Granulocytes: 0 %
Lymphocytes Relative: 28 %
Lymphs Abs: 2.7 10*3/uL (ref 0.7–4.0)
MCH: 24.6 pg — ABNORMAL LOW (ref 26.0–34.0)
MCHC: 31.9 g/dL (ref 30.0–36.0)
MCV: 77.3 fL — ABNORMAL LOW (ref 80.0–100.0)
Monocytes Absolute: 0.7 10*3/uL (ref 0.1–1.0)
Monocytes Relative: 7 %
Neutro Abs: 6.1 10*3/uL (ref 1.7–7.7)
Neutrophils Relative %: 63 %
Platelets: 301 10*3/uL (ref 150–400)
RBC: 6.21 MIL/uL — ABNORMAL HIGH (ref 4.22–5.81)
RDW: 12.9 % (ref 11.5–15.5)
WBC: 9.6 10*3/uL (ref 4.0–10.5)
nRBC: 0 % (ref 0.0–0.2)

## 2019-07-08 LAB — LIPASE, BLOOD: Lipase: 17 U/L (ref 11–51)

## 2019-07-08 LAB — CBG MONITORING, ED: Glucose-Capillary: 358 mg/dL — ABNORMAL HIGH (ref 70–99)

## 2019-07-08 MED ORDER — ONDANSETRON 4 MG PO TBDP
4.0000 mg | ORAL_TABLET | Freq: Once | ORAL | Status: AC | PRN
  Administered 2019-07-08: 4 mg via ORAL
  Filled 2019-07-08: qty 1

## 2019-07-08 MED ORDER — SODIUM CHLORIDE 0.9 % IV BOLUS
1000.0000 mL | Freq: Once | INTRAVENOUS | Status: AC
  Administered 2019-07-09: 1000 mL via INTRAVENOUS

## 2019-07-08 NOTE — ED Provider Notes (Signed)
[facility] EMERGENCY DEPARTMENT Provider Note   CSN: [id number] Arrival date & time: 07/08/19  1646     History   Chief Complaint Chief Complaint  Patient presents with   Abdominal Pain    HPI [name] Gregory is a 48 y.o. male.     Patient presents to the emergency department for evaluation of nausea, vomiting, diarrhea with abdominal pain.  Symptoms have been ongoing for 2 weeks.  He tried to get to see his doctor, was referred to the ER today.  He reports that he has been having at least 6 episodes of nausea, vomiting and diarrhea per day.  He has a lot of crampy pain across his upper abdomen with this.  He has not had any hematemesis, rectal bleeding or melena.     Past Medical History:  Diagnosis Date   Complication of anesthesia    WOKE UP DURING SURGERY   Diabetes ([place])    Hemangioma of liver    Hypertension    Sleep apnea    URI (upper respiratory infection)     Patient Active Problem List   Diagnosis Date Noted   OSA on CPAP 06/14/2017   Morbid (severe) obesity due to excess calories ([place]) 06/14/2017   Abdominal pain, epigastric 04/24/2016   RUQ pain 04/24/2016   Microcytic anemia 04/24/2016   Diarrhea 04/24/2016   Elevated sed rate 04/24/2016   Splenomegaly 04/24/2016    Past Surgical History:  Procedure Laterality Date   BIOPSY  05/04/2016   Procedure: BIOPSY;  Surgeon: Daneil Dolin, MD;  [facility];  Service: Endoscopy;;  gastric bx   COLONOSCOPY WITH PROPOFOL N/A 05/04/2016   Procedure: COLONOSCOPY WITH PROPOFOL;  Surgeon: Daneil Dolin, MD;  [facility];  Service: Endoscopy;  Laterality: N/A;  9:45 AM   ESOPHAGOGASTRODUODENOSCOPY (EGD) WITH PROPOFOL N/A 05/04/2016   Procedure: ESOPHAGOGASTRODUODENOSCOPY (EGD) WITH PROPOFOL;  Surgeon: Daneil Dolin, MD;  [facility];  Service: Endoscopy;  Laterality: N/A;   EYE SURGERY     SALIVARY GLAND SURGERY  1992        Home Medications    Prior  to Admission medications   Medication Sig Start Date End Date Taking? Authorizing Provider  albuterol (PROVENTIL HFA;VENTOLIN HFA) 108 (90 Base) MCG/ACT inhaler Inhale 2 puffs into the lungs every 4 (four) hours as needed for wheezing or shortness of breath. 01/06/18   Noemi Chapel, MD  amLODipine (NORVASC) 10 MG tablet Take 10 mg by mouth daily. 03/11/16   [provider]  aspirin 81 MG tablet Take 81 mg by mouth daily.    [provider]  benzonatate (TESSALON) 100 MG capsule Take 1 capsule (100 mg total) by mouth every 8 (eight) hours. 01/06/18   Noemi Chapel, MD  buPROPion (ZYBAN) 150 MG 12 hr tablet Take 150 mg by mouth 2 (two) times daily.    [provider]  carvedilol (COREG) 25 MG tablet Take 25 mg by mouth 2 (two) times daily with a meal.    [provider]  citalopram (CELEXA) 20 MG tablet Take 1 tablet by mouth daily. 04/24/17   [provider]  DEXILANT 60 MG capsule TAKE ONE CAPSULE BY MOUTH DAILY Patient not taking: Reported on 01/06/2018 06/05/17   Carlis Stable, NP  diphenoxylate-atropine (LOMOTIL) 2.5-0.025 MG tablet Take 1-2 tablets by mouth 4 (four) times daily as needed for diarrhea or loose stools. 07/09/19   Orpah Greek, MD  gabapentin (NEURONTIN) 300 MG  capsule Take 300 mg by mouth 2 (two) times daily. Take one capsule by mouth in the morning and two at bedtime for nerve pain     [provider]  glimepiride (AMARYL) 2 MG tablet Take 1 tablet (2 mg total) by mouth daily before breakfast. Patient not taking: Reported on 01/06/2018 03/18/16   Wendie Agreste, MD  irbesartan-hydrochlorothiazide (AVALIDE) 300-12.5 MG tablet Take 1 tablet by mouth daily. 05/06/17   [provider]  LORazepam (ATIVAN) 0.5 MG tablet Take 0.5 mg by mouth every 8 (eight) hours as needed for anxiety.     [provider]  metFORMIN (GLUCOPHAGE-XR) 500 MG 24 hr tablet Take 1 tablet by mouth daily. 04/21/17   [provider]  Naproxen-Esomeprazole (VIMOVO) 500-20 MG TBEC Take by mouth daily.    [provider]  ondansetron (ZOFRAN) 4 MG tablet Take 1 tablet (4 mg total) by mouth every 6 (six) hours as needed for nausea or vomiting. 07/09/19   Orpah Greek, MD  Vitamin D, Ergocalciferol, (DRISDOL) 50000 units CAPS capsule Take 1 capsule by mouth 3 (three) times a week. 02/03/16   [provider]    Family History Family History  Problem Relation Age of Onset   Hypertension Mother    CVA Father    Diabetes Father    Throat cancer Father    Heart disease Brother 79   Cervical cancer Maternal Grandmother    Throat cancer Maternal Grandmother    Throat cancer Maternal Grandfather    Bladder Cancer Maternal Grandfather    Colon cancer Neg Hx     Social History Social History   Tobacco Use   Smoking status: Current Every Day Smoker    Packs/day: 0.25    Years: 36.00    Pack years: 9.00    Types: Cigarettes   Smokeless tobacco: Never Used   Tobacco comment: 4cigs a day as of 07/19/17 ep  Substance Use Topics   Alcohol use: No   Drug use: No     Allergies   Patient has no known allergies.   Review of Systems Review of Systems  Gastrointestinal: Positive for abdominal pain, diarrhea, nausea and vomiting.  All other systems reviewed and are negative.    Physical Exam Updated Vital Signs BP (!) 187/128 (BP Location: Right Arm)    Pulse (!) 101    Temp 98.9 F (37.2 C) (Oral)    Resp 18    Ht 6\' 3"  (1.905 m)    Wt 127.9 kg    SpO2 100%    BMI 35.25 kg/m   Physical Exam Vitals signs and nursing note reviewed.  Constitutional:      General: He is not in acute distress.    Appearance: Normal appearance. He is well-developed.  HENT:     Head: Normocephalic and atraumatic.     Right Ear: Hearing normal.     Left Ear: Hearing normal.     Nose: Nose normal.  Eyes:     Conjunctiva/sclera: Conjunctivae normal.     Pupils: Pupils are equal, round,  and reactive to light.  Neck:     Musculoskeletal: Normal range of motion and neck supple.  Cardiovascular:     Rate and Rhythm: Regular rhythm.     Heart sounds: S1 normal and S2 normal. No murmur. No friction rub. No gallop.   Pulmonary:     Effort: Pulmonary effort is normal. No respiratory distress.     Breath sounds: Normal breath sounds.  Chest:     Chest wall: No tenderness.  Abdominal:     General: Bowel sounds are normal.     Palpations: Abdomen is soft.     Tenderness: There is abdominal tenderness in the right upper quadrant, epigastric area and left upper quadrant. There is no guarding or rebound. Negative signs include Murphy's sign and McBurney's sign.     Hernia: No hernia is present.  Musculoskeletal: Normal range of motion.  Skin:    General: Skin is warm and dry.     Findings: No rash.  Neurological:     Mental Status: He is alert and oriented to person, place, and time.     GCS: GCS eye subscore is 4. GCS verbal subscore is 5. GCS motor subscore is 6.     Cranial Nerves: No cranial nerve deficit.     Sensory: No sensory deficit.     Coordination: Coordination normal.  Psychiatric:        Speech: Speech normal.        Behavior: Behavior normal.        Thought Content: Thought content normal.      ED Treatments / Results  Labs (all labs ordered are listed, but only abnormal results are displayed) Labs Reviewed  COMPREHENSIVE METABOLIC PANEL - Abnormal; Notable for the following components:      Result Value   Sodium 134 (*)    Chloride 96 (*)    Glucose, Bld 310 (*)    Total Bilirubin 1.3 (*)    All other components within normal limits  CBC WITH DIFFERENTIAL/PLATELET - Abnormal; Notable for the following components:   RBC 6.21 (*)    MCV 77.3 (*)    MCH 24.6 (*)    All other components within normal limits  CBG MONITORING, ED - Abnormal; Notable for the following components:   Glucose-Capillary 358 (*)    All other components within normal limits    LIPASE, BLOOD  URINALYSIS, ROUTINE W REFLEX MICROSCOPIC    EKG None  Radiology Ct Abdomen Pelvis W Contrast  Result Date: 07/09/2019 CLINICAL DATA:  Acute generalized abdominal pain, nausea, vomiting and diarrhea EXAM: CT ABDOMEN AND PELVIS WITH CONTRAST TECHNIQUE: Multidetector CT imaging of the abdomen and pelvis was performed using the standard protocol following bolus administration of intravenous contrast. CONTRAST:  143mL OMNIPAQUE IOHEXOL 300 MG/ML  SOLN COMPARISON:  CT abdomen pelvis March 31, 2017. Abdominal MR March 25 2016 FINDINGS: Lower chest: Lung bases are clear. Normal heart size. No pericardial effusion. Hepatobiliary: Redemonstration the previously characterized hemangioma in the anterior segment right lobe liver somewhat ill-defined on this exam due to contrast timing. Measures 3.5 by 2.2 cm in size, not significantly changed from comparison. No other focal hepatic abnormality. Gallbladder is unremarkable. No gallstones, gallbladder wall thickening, or biliary dilatation. Pancreas: Unremarkable. No pancreatic ductal dilatation or surrounding inflammatory changes. Spleen: Normal in size without focal abnormality. Adrenals/Urinary Tract: Normal adrenal glands. Mild bilateral symmetric perinephric stranding is similar to comparison exams. No concerning renal lesion. 9 mm hypoattenuating lesion in the interpolar left kidney is compatible with cysts seen on prior MRI. Not significantly changed from comparison CT. No urolithiasis or hydronephrosis. Urinary bladder is unremarkable. Stomach/Bowel: Distal esophagus, stomach and duodenal sweep are unremarkable. No small bowel wall thickening or dilatation. No evidence of obstruction. A normal appendix is visualized. Appendix courses towards midline. Pancolonic diverticula without focal pericolonic inflammation to suggest diverticulitis. No colonic dilatation or wall thickening. Vascular/Lymphatic: Atherosclerotic plaque in the iliac arteries.  No abdominal aortic aneurysm or ectasia. No suspicious or enlarged lymph nodes in the included lymphatic chains. Reproductive: The prostate and seminal vesicles are unremarkable. Other: No abdominopelvic free fluid or free gas. No bowel containing hernias. Musculoskeletal: No acute osseous abnormality or suspicious osseous lesion. Multilevel degenerative changes are present in the imaged portions of the spine. Features most pronounced L4-L5. IMPRESSION: 1. No acute intra-abdominal process. No explanation for the patient's symptoms. 2. Pancolonic diverticula without evidence of diverticulitis. 3. Stable hepatic hemangioma. 4. Stable left renal cyst. 5. Aortic Atherosclerosis (ICD10-I70.0). Electronically Signed   By: Lovena Le M.D.   On: 07/09/2019 01:31    Procedures Procedures (including critical care time)  Medications Ordered in ED Medications  ondansetron (ZOFRAN-ODT) disintegrating tablet 4 mg (4 mg Oral Given 07/08/19 1750)  sodium chloride 0.9 % bolus 1,000 mL (1,000 mLs Intravenous New Bag/Given 07/09/19 0118)  iohexol (OMNIPAQUE) 300 MG/ML solution 100 mL (100 mLs Intravenous Contrast Given 07/09/19 0104)     Initial Impression / Assessment and Plan / ED Course  I have reviewed the triage vital signs and the nursing notes.  Pertinent labs & imaging results that were available during my care of the patient were reviewed by me and considered in my medical decision making (see chart for details).       Patient presents to the emergency department for evaluation of abdominal pain with nausea, vomiting and diarrhea.  Symptoms ongoing for 2 weeks.  Patient referred to the ER by his primary care doctor.  Lab work was entirely unremarkable.  He did have diffuse upper abdominal tenderness, however no guarding or rebound.  No Murphy sign.  Patient underwent CT scan which was normal other than diverticular disease without diverticulitis.  Patient reassured, will treat symptomatically.  He was  unable to give a stool sample here.  Will recommend follow-up with GI for further testing and treatment.  Final Clinical Impressions(s) / ED Diagnoses   Final diagnoses:  Nausea vomiting and diarrhea    ED Discharge Orders         Ordered    ondansetron (ZOFRAN) 4 MG tablet  Every 6 hours PRN     07/09/19 0218    diphenoxylate-atropine (LOMOTIL) 2.5-0.025 MG tablet  4 times daily PRN     07/09/19 0218           Orpah Greek, MD 07/09/19 [phone]

## 2019-07-08 NOTE — ED Triage Notes (Signed)
PT c/o n/v/d anytime he tries to eat or drink anything worsening over the past week. Pt states generalized abdominal discomfort. PT states vomited x6 today (phlegm/liquid) and 6 loose stools today. PT states he can't keep his HTN medications down and his blood pressure has been elevated since he has been sick.

## 2019-07-09 DIAGNOSIS — R1110 Vomiting, unspecified: Secondary | ICD-10-CM | POA: Diagnosis not present

## 2019-07-09 DIAGNOSIS — R197 Diarrhea, unspecified: Secondary | ICD-10-CM | POA: Diagnosis not present

## 2019-07-09 MED ORDER — DIPHENOXYLATE-ATROPINE 2.5-0.025 MG PO TABS: 1.0000 | ORAL_TABLET | Freq: Four times a day (QID) | ORAL | 0 refills | Status: DC | PRN

## 2019-07-09 MED ORDER — IOHEXOL 300 MG/ML  SOLN
100.0000 mL | Freq: Once | INTRAMUSCULAR | Status: AC | PRN
  Administered 2019-07-09: 100 mL via INTRAVENOUS

## 2019-07-09 MED ORDER — ONDANSETRON HCL 4 MG PO TABS: 4.0000 mg | ORAL_TABLET | Freq: Four times a day (QID) | ORAL | 0 refills | Status: DC | PRN

## 2019-07-16 NOTE — Progress Notes (Signed)
Referring Provider: No ref. provider found Primary Care Physician:  Vidal Schwalbe, MD Primary GI Physician: Dr. Gala Romney  Chief Complaint  Patient presents with  . Abdominal Pain    seen ER 11/24. "feels like something is cutting inside through different areas"  . Diarrhea    x 1 month, especially with eating at certain times. Worse x 2 weeks. 5-6 episodes a day  . Emesis    HPI:   [name] Gregory is a 48 y.o. male presenting today with a history of right upper quadrant abdominal pain and nausea.  Known hemangioma of the liver. Last seen in our office in October 2018.  He reported constant nausea, intermittent right upper quadrant pain, dark stools, alternating constipation and diarrhea.  Prior work-up had included EGD in September 2017 with gastritis, reflux esophagitis. Colonoscopy with diverticulosis, due for repeat in 2027.  CTs unrevealing.  Plans to pursue HIDA scan and check I fob. HIDA was normal.  I fob not completed.  Patient presented to the ED on 07/08/19 for nausea, vomiting, diarrhea, and cramping upper abdominal pain x2 weeks.  Labs with lipase normal, glucose elevated at 310, sodium slightly low 134, potassium and kidney function normal. Total bilirubin 1.3, LFTs normal.  WBC normal at 9.6, hemoglobin normal at 15.3 with microcytic indices which are chronic.  CT without any acute explanation for patient's symptoms.  Liver with stable hemangioma.  Gallbladder unremarkable.  No biliary ductal dilation.  Stomach, small bowel, and colon without characteristics of inflammation or wall thickening.  Pancolonic diverticula without diverticulitis.  Patient was prescribed Zofran and Lomotil.  Advised to follow-up with GI.  Today he states lightheadedness and dizziness has been coming and going. Today has been much worse and was stumbling around. Has been having N/V/D. Postprandial diarrhea for about 1 month to 1.5 months. For the last 2-3 weeks, has been having nausea and vomiting and  worsening diarrhea. When vomiting, it is thick mucous. Vomits 6-7 times a day. No hematemesis. Hasn't been able to keep much down. Saltine crackers do ok. Having waves of nausea even with just smelling something. Having intermittent "cutting feeling" at various places in his abdomen. Abdominal pain present for the last 2 weeks as well. "Cutting feeling" often before he has a BM. 10/10. Stools are watery. Mucous is present. No blood in the stool. Some toilet tissue hematochezia and burning when wiping from all the diarrhea. Using preparation H. No prior brbpr. No melena. Having 6-7 BMs a day. Prior to 2-3 weeks ago, it was much less. Has been taking Lomotil and zofran and continues with symptoms. Meds help some, but not much. Reports 30-40 lb weight loss in the last few weeks. Keeps smelling "butter milk smell." No sick contacts. 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 No one at home has gotten sick. No recent antibiotics. No hospitalizations.   When asked about his diabetes and significantly elevated glucose when at the ED 1 week ago, patient reports he was not able to keep his medications down due to nausea and vomiting.  States he has been able to keep them down at times.  Reports glucose in the 140s the last few days.   Admits to low-grade fever a few weeks ago when symptoms started to worsen.  No recent fever or chills.  Admits to mild nasal congestion but a lot of postnasal drainage for the last couple of weeks.  Denies sore throat.  Denies chest pain, heart palpitations, shortness of breath, cough.  Past Medical History:  Diagnosis Date  . Complication of anesthesia    WOKE UP DURING SURGERY  . Diabetes ([place])   . Hemangioma of liver   . Hypertension   . Sleep apnea   . URI (upper respiratory infection)     Past Surgical History:  Procedure Laterality Date  . BIOPSY  05/04/2016   Procedure: BIOPSY;  Surgeon: Daneil Dolin, MD;  Location: [facility];  Service: Endoscopy;;  gastric bx  . COLONOSCOPY WITH  PROPOFOL N/A 05/04/2016   Procedure: COLONOSCOPY WITH PROPOFOL;  Surgeon: Daneil Dolin, MD;  Location: [facility];  Service: Endoscopy;  Laterality: N/A;  9:45 AM  . ESOPHAGOGASTRODUODENOSCOPY (EGD) WITH PROPOFOL N/A 05/04/2016   Procedure: ESOPHAGOGASTRODUODENOSCOPY (EGD) WITH PROPOFOL;  Surgeon: Daneil Dolin, MD;  Location: [facility];  Service: Endoscopy;  Laterality: N/A;  . EYE SURGERY    . SALIVARY GLAND SURGERY  1992    No current facility-administered medications for this visit.    Current Outpatient Medications  Medication Sig Dispense Refill  . amLODipine (NORVASC) 10 MG tablet Take 10 mg by mouth daily.    Marland Kitchen buPROPion (ZYBAN) 150 MG 12 hr tablet Take 150 mg by mouth 2 (two) times daily.    . carvedilol (COREG) 25 MG tablet Take 25 mg by mouth 2 (two) times daily with a meal.    . citalopram (CELEXA) 20 MG tablet Take 1 tablet by mouth daily.    . diphenoxylate-atropine (LOMOTIL) 2.5-0.025 MG tablet Take 1-2 tablets by mouth 4 (four) times daily as needed for diarrhea or loose stools. 30 tablet 0  . gabapentin (NEURONTIN) 300 MG capsule Take 300 mg by mouth 2 (two) times daily. Take one capsule by mouth in the morning and two at bedtime for nerve pain     . glimepiride (AMARYL) 2 MG tablet Take 1 tablet (2 mg total) by mouth daily before breakfast. 30 tablet 0  . irbesartan-hydrochlorothiazide (AVALIDE) 300-12.5 MG tablet Take 1 tablet by mouth daily.    Marland Kitchen LORazepam (ATIVAN) 0.5 MG tablet Take 0.5 mg by mouth every 8 (eight) hours as needed for anxiety.     . metFORMIN (GLUCOPHAGE-XR) 500 MG 24 hr tablet Take 1 tablet by mouth daily.    . Naproxen-Esomeprazole (VIMOVO) 500-20 MG TBEC Take by mouth daily.    . Vitamin D, Ergocalciferol, (DRISDOL) 50000 units CAPS capsule Take 1 capsule by mouth 3 (three) times a week.    Marland Kitchen omeprazole (PRILOSEC) 20 MG capsule Take 20 mg by mouth daily.    . ondansetron (ZOFRAN) 4 MG tablet Take 1 tablet (4 mg total) by mouth every 6 (six)  hours as needed for nausea or vomiting. 30 tablet 1   Facility-Administered Medications Ordered in Other Visits  Medication Dose Route Frequency Provider Last Rate Last Dose  . 0.9 %  sodium chloride infusion   Intravenous Continuous Manuella Ghazi, Pratik D, DO      . acetaminophen (TYLENOL) tablet 650 mg  650 mg Oral Q6H PRN Manuella Ghazi, Pratik D, DO       Or  . acetaminophen (TYLENOL) suppository 650 mg  650 mg Rectal Q6H PRN Manuella Ghazi, Pratik D, DO      . buPROPion (ZYBAN) 12 hr tablet 150 mg  150 mg Oral BID Manuella Ghazi, Pratik D, DO      . citalopram (CELEXA) tablet 20 mg  20 mg Oral Daily Shah, Pratik D, DO      . gabapentin (NEURONTIN) capsule 300 mg  300 mg Oral Daily  Manuella Ghazi, Pratik D, DO      . gabapentin (NEURONTIN) capsule 600 mg  600 mg Oral QHS Shah, Pratik D, DO      . heparin injection 5,000 Units  5,000 Units Subcutaneous Q8H Shah, Pratik D, DO      . insulin aspart (novoLOG) injection 0-15 Units  0-15 Units Subcutaneous Q4H Shah, Pratik D, DO      . insulin aspart (novoLOG) injection 0-5 Units  0-5 Units Subcutaneous QHS Shah, Pratik D, DO      . insulin aspart (novoLOG) injection 4 Units  4 Units Subcutaneous TID WC Shah, Pratik D, DO      . LORazepam (ATIVAN) tablet 0.5 mg  0.5 mg Oral Q8H PRN Manuella Ghazi, Pratik D, DO      . metoCLOPramide (REGLAN) injection 5 mg  5 mg Intravenous Q8H Shah, Pratik D, DO   5 mg at 07/17/19 1421  . ondansetron (ZOFRAN) tablet 4 mg  4 mg Oral Q6H PRN Manuella Ghazi, Pratik D, DO       Or  . ondansetron (ZOFRAN) injection 4 mg  4 mg Intravenous Q6H PRN Manuella Ghazi, Pratik D, DO      . pantoprazole (PROTONIX) injection 40 mg  40 mg Intravenous Q24H Shah, Pratik D, DO   40 mg at 07/17/19 1421    Allergies as of 07/17/2019  . (No Known Allergies)    Family History  Problem Relation Age of Onset  . Hypertension Mother   . CVA Father   . Diabetes Father   . Throat cancer Father   . Heart disease Brother 15  . Cervical cancer Maternal Grandmother   . Throat cancer Maternal Grandmother   .  Throat cancer Maternal Grandfather   . Bladder Cancer Maternal Grandfather   . Colon cancer Neg Hx     Social History   Socioeconomic History  . Marital status: Married    Spouse name: Not on file  . Number of children: Not on file  . Years of education: Not on file  . Highest education level: Not on file  Occupational History  . Occupation: Data processing manager for [employer]  Social Needs  . Financial resource strain: Not on file  . Food insecurity    Worry: Not on file    Inability: Not on file  . Transportation needs    Medical: Not on file    Non-medical: Not on file  Tobacco Use  . Smoking status: Current Every Day Smoker    Packs/day: 0.25    Years: 36.00    Pack years: 9.00    Types: Cigarettes  . Smokeless tobacco: Never Used  . Tobacco comment: 4cigs a day as of 07/19/17 ep  Substance and Sexual Activity  . Alcohol use: No  . Drug use: No  . Sexual activity: Not on file  Lifestyle  . Physical activity    Days per week: Not on file    Minutes per session: Not on file  . Stress: Not on file  Relationships  . Social Herbalist on phone: Not on file    Gets together: Not on file    Attends religious service: Not on file    Active member of club or organization: Not on file    Attends meetings of clubs or organizations: Not on file    Relationship status: Not on file  Other Topics Concern  . Not on file  Social History Narrative  . Not on file    Review of  Systems: Gen: See HPI HEENT: See HPI CV: See HPI Resp: See HPI. GI: See HPI Derm: Denies rash Heme: See HPI.   Physical Exam: BP 96/71   Pulse 90   Temp 97.9 F (36.6 C) (Oral)   Ht 6\' 2"  (1.88 m)   Wt 278 lb 6.4 oz (126.3 kg)   BMI 35.74 kg/m  General:   CMA reports patient was diaphoretic when bringing him back to the room. This had resolved by the time I saw him. Alert and oriented. No distress noted. Head:  Normocephalic and atraumatic. Eyes:  Conjuctiva clear without scleral  icterus. Heart:  S1, S2 present without murmurs appreciated. Lungs:  Clear to auscultation bilaterally. No wheezes, rales, or rhonchi. No distress.  Abdomen:  +BS, soft, and non-distended. Moderate tenderness to palpation diffusely. No rebound or guarding. No HSM or masses noted. Msk:  Symmetrical without gross deformities. Normal posture. Extremities:  Without edema. Neurologic:  Alert and  oriented x4 Psych: Normal mood and affect.

## 2019-07-17 ENCOUNTER — Encounter (HOSPITAL_COMMUNITY): Payer: Self-pay

## 2019-07-17 ENCOUNTER — Encounter: Payer: Self-pay | Admitting: Gastroenterology

## 2019-07-17 ENCOUNTER — Inpatient Hospital Stay (HOSPITAL_COMMUNITY)
Admission: EM | Admit: 2019-07-17 | Discharge: 2019-07-18 | DRG: 684 | Disposition: A | Discharge status: Home / Self Care | Payer: BC Managed Care – PPO | Attending: Internal Medicine | Admitting: Internal Medicine

## 2019-07-17 ENCOUNTER — Ambulatory Visit: Payer: BC Managed Care – PPO | Admitting: Gastroenterology

## 2019-07-17 ENCOUNTER — Other Ambulatory Visit: Payer: Self-pay

## 2019-07-17 ENCOUNTER — Emergency Department (HOSPITAL_COMMUNITY): Payer: BC Managed Care – PPO

## 2019-07-17 ENCOUNTER — Inpatient Hospital Stay (HOSPITAL_COMMUNITY): Payer: BC Managed Care – PPO

## 2019-07-17 VITALS — BP 96/71 | HR 90 | Temp 97.9°F | Ht 74.0 in | Wt 278.4 lb

## 2019-07-17 DIAGNOSIS — R7401 Elevation of levels of liver transaminase levels: Secondary | ICD-10-CM

## 2019-07-17 DIAGNOSIS — I10 Essential (primary) hypertension: Secondary | ICD-10-CM | POA: Diagnosis not present

## 2019-07-17 DIAGNOSIS — R112 Nausea with vomiting, unspecified: Secondary | ICD-10-CM | POA: Insufficient documentation

## 2019-07-17 DIAGNOSIS — E1165 Type 2 diabetes mellitus with hyperglycemia: Secondary | ICD-10-CM | POA: Diagnosis present

## 2019-07-17 DIAGNOSIS — R0981 Nasal congestion: Secondary | ICD-10-CM | POA: Insufficient documentation

## 2019-07-17 DIAGNOSIS — K3184 Gastroparesis: Secondary | ICD-10-CM | POA: Diagnosis not present

## 2019-07-17 DIAGNOSIS — Z20828 Contact with and (suspected) exposure to other viral communicable diseases: Secondary | ICD-10-CM | POA: Diagnosis not present

## 2019-07-17 DIAGNOSIS — K219 Gastro-esophageal reflux disease without esophagitis: Secondary | ICD-10-CM | POA: Diagnosis not present

## 2019-07-17 DIAGNOSIS — Z9114 Patient's other noncompliance with medication regimen: Secondary | ICD-10-CM

## 2019-07-17 DIAGNOSIS — R42 Dizziness and giddiness: Secondary | ICD-10-CM | POA: Diagnosis not present

## 2019-07-17 DIAGNOSIS — F17210 Nicotine dependence, cigarettes, uncomplicated: Secondary | ICD-10-CM | POA: Diagnosis not present

## 2019-07-17 DIAGNOSIS — R945 Abnormal results of liver function studies: Secondary | ICD-10-CM | POA: Diagnosis not present

## 2019-07-17 DIAGNOSIS — Z7984 Long term (current) use of oral hypoglycemic drugs: Secondary | ICD-10-CM

## 2019-07-17 DIAGNOSIS — E669 Obesity, unspecified: Secondary | ICD-10-CM | POA: Diagnosis not present

## 2019-07-17 DIAGNOSIS — G4733 Obstructive sleep apnea (adult) (pediatric): Secondary | ICD-10-CM | POA: Diagnosis not present

## 2019-07-17 DIAGNOSIS — R739 Hyperglycemia, unspecified: Secondary | ICD-10-CM | POA: Diagnosis not present

## 2019-07-17 DIAGNOSIS — N179 Acute kidney failure, unspecified: Principal | ICD-10-CM

## 2019-07-17 DIAGNOSIS — Z833 Family history of diabetes mellitus: Secondary | ICD-10-CM | POA: Diagnosis not present

## 2019-07-17 DIAGNOSIS — R161 Splenomegaly, not elsewhere classified: Secondary | ICD-10-CM | POA: Diagnosis present

## 2019-07-17 DIAGNOSIS — R197 Diarrhea, unspecified: Secondary | ICD-10-CM

## 2019-07-17 DIAGNOSIS — Z79899 Other long term (current) drug therapy: Secondary | ICD-10-CM

## 2019-07-17 DIAGNOSIS — E1143 Type 2 diabetes mellitus with diabetic autonomic (poly)neuropathy: Secondary | ICD-10-CM | POA: Diagnosis not present

## 2019-07-17 DIAGNOSIS — R109 Unspecified abdominal pain: Secondary | ICD-10-CM | POA: Insufficient documentation

## 2019-07-17 DIAGNOSIS — Z8249 Family history of ischemic heart disease and other diseases of the circulatory system: Secondary | ICD-10-CM

## 2019-07-17 DIAGNOSIS — R748 Abnormal levels of other serum enzymes: Secondary | ICD-10-CM

## 2019-07-17 DIAGNOSIS — Z03818 Encounter for observation for suspected exposure to other biological agents ruled out: Secondary | ICD-10-CM | POA: Diagnosis not present

## 2019-07-17 DIAGNOSIS — Z6835 Body mass index (BMI) 35.0-35.9, adult: Secondary | ICD-10-CM | POA: Diagnosis not present

## 2019-07-17 DIAGNOSIS — D1803 Hemangioma of intra-abdominal structures: Secondary | ICD-10-CM | POA: Diagnosis not present

## 2019-07-17 DIAGNOSIS — E860 Dehydration: Secondary | ICD-10-CM | POA: Diagnosis present

## 2019-07-17 DIAGNOSIS — K760 Fatty (change of) liver, not elsewhere classified: Secondary | ICD-10-CM | POA: Diagnosis not present

## 2019-07-17 DIAGNOSIS — Z713 Dietary counseling and surveillance: Secondary | ICD-10-CM

## 2019-07-17 DIAGNOSIS — R0982 Postnasal drip: Secondary | ICD-10-CM | POA: Insufficient documentation

## 2019-07-17 DIAGNOSIS — R29818 Other symptoms and signs involving the nervous system: Secondary | ICD-10-CM | POA: Diagnosis not present

## 2019-07-17 LAB — CBC
HCT: 46.1 % (ref 39.0–52.0)
Hemoglobin: 14.8 g/dL (ref 13.0–17.0)
MCH: 24.4 pg — ABNORMAL LOW (ref 26.0–34.0)
MCHC: 32.1 g/dL (ref 30.0–36.0)
MCV: 76.1 fL — ABNORMAL LOW (ref 80.0–100.0)
Platelets: 264 10*3/uL (ref 150–400)
RBC: 6.06 MIL/uL — ABNORMAL HIGH (ref 4.22–5.81)
RDW: 12.3 % (ref 11.5–15.5)
WBC: 11.3 10*3/uL — ABNORMAL HIGH (ref 4.0–10.5)
nRBC: 0 % (ref 0.0–0.2)

## 2019-07-17 LAB — URINALYSIS, ROUTINE W REFLEX MICROSCOPIC
Bilirubin Urine: NEGATIVE
Glucose, UA: >=500 mg/dL — AB
Ketones, ur: NEGATIVE mg/dL
Leukocytes,Ua: NEGATIVE
Nitrite: NEGATIVE
Protein, ur: 100 mg/dL — AB
Specific Gravity, Urine: 1.01 (ref 1.005–1.030)
pH: 6 (ref 5.0–8.0)

## 2019-07-17 LAB — GLUCOSE, CAPILLARY
Glucose-Capillary: 156 mg/dL — ABNORMAL HIGH (ref 70–99)
Glucose-Capillary: 215 mg/dL — ABNORMAL HIGH (ref 70–99)
Glucose-Capillary: 266 mg/dL — ABNORMAL HIGH (ref 70–99)

## 2019-07-17 LAB — COMPREHENSIVE METABOLIC PANEL
ALT: 127 U/L — ABNORMAL HIGH (ref 0–44)
AST: 136 U/L — ABNORMAL HIGH (ref 15–41)
Albumin: 3.6 g/dL (ref 3.5–5.0)
Alkaline Phosphatase: 98 U/L (ref 38–126)
Anion gap: 13 (ref 5–15)
BUN: 14 mg/dL (ref 6–20)
CO2: 23 mmol/L (ref 22–32)
Calcium: 8.9 mg/dL (ref 8.9–10.3)
Chloride: 92 mmol/L — ABNORMAL LOW (ref 98–111)
Creatinine, Ser: 1.59 mg/dL — ABNORMAL HIGH (ref 0.61–1.24)
GFR calc Af Amer: 59 mL/min — ABNORMAL LOW (ref >60)
GFR calc non Af Amer: 51 mL/min — ABNORMAL LOW (ref >60)
Glucose, Bld: 370 mg/dL — ABNORMAL HIGH (ref 70–99)
Potassium: 4.4 mmol/L (ref 3.5–5.1)
Sodium: 128 mmol/L — ABNORMAL LOW (ref 135–145)
Total Bilirubin: 2.6 mg/dL — ABNORMAL HIGH (ref 0.3–1.2)
Total Protein: 6.5 g/dL (ref 6.5–8.1)

## 2019-07-17 LAB — C DIFFICILE QUICK SCREEN W PCR REFLEX
C Diff antigen: NEGATIVE
C Diff interpretation: NOT DETECTED
C Diff toxin: NEGATIVE

## 2019-07-17 LAB — HEMOGLOBIN A1C
Hgb A1c MFr Bld: 9.7 % — ABNORMAL HIGH (ref 4.8–5.6)
Mean Plasma Glucose: 231.69 mg/dL

## 2019-07-17 LAB — LIPASE, BLOOD: Lipase: 19 U/L (ref 11–51)

## 2019-07-17 LAB — HIV ANTIBODY (ROUTINE TESTING W REFLEX): HIV Screen 4th Generation wRfx: NONREACTIVE

## 2019-07-17 LAB — CBG MONITORING, ED: Glucose-Capillary: 291 mg/dL — ABNORMAL HIGH (ref 70–99)

## 2019-07-17 MED ORDER — HEPARIN SODIUM (PORCINE) 5000 UNIT/ML IJ SOLN
5000.0000 [IU] | Freq: Three times a day (TID) | INTRAMUSCULAR | Status: DC
  Administered 2019-07-17 – 2019-07-18 (×2): 5000 [IU] via SUBCUTANEOUS
  Filled 2019-07-17 (×2): qty 1

## 2019-07-17 MED ORDER — PANTOPRAZOLE SODIUM 40 MG IV SOLR
40.0000 mg | INTRAVENOUS | Status: DC
  Administered 2019-07-17: 40 mg via INTRAVENOUS
  Filled 2019-07-17 (×2): qty 40

## 2019-07-17 MED ORDER — ACETAMINOPHEN 325 MG PO TABS: 650.0000 mg | ORAL_TABLET | Freq: Four times a day (QID) | ORAL | Status: DC | PRN

## 2019-07-17 MED ORDER — ONDANSETRON HCL 4 MG PO TABS: 4.0000 mg | ORAL_TABLET | Freq: Four times a day (QID) | ORAL | 1 refills | Status: DC | PRN

## 2019-07-17 MED ORDER — ONDANSETRON HCL 4 MG PO TABS: 4.0000 mg | ORAL_TABLET | Freq: Four times a day (QID) | ORAL | Status: DC | PRN

## 2019-07-17 MED ORDER — ONDANSETRON HCL 4 MG/2ML IJ SOLN: 4.0000 mg | Freq: Four times a day (QID) | INTRAMUSCULAR | Status: DC | PRN

## 2019-07-17 MED ORDER — SODIUM CHLORIDE 0.9 % IV BOLUS
1000.0000 mL | Freq: Once | INTRAVENOUS | Status: AC
  Administered 2019-07-17: 1000 mL via INTRAVENOUS

## 2019-07-17 MED ORDER — GABAPENTIN 300 MG PO CAPS
300.0000 mg | ORAL_CAPSULE | Freq: Every day | ORAL | Status: DC
  Administered 2019-07-17 – 2019-07-18 (×2): 300 mg via ORAL
  Filled 2019-07-17 (×2): qty 1

## 2019-07-17 MED ORDER — CITALOPRAM HYDROBROMIDE 20 MG PO TABS
20.0000 mg | ORAL_TABLET | Freq: Every day | ORAL | Status: DC
  Administered 2019-07-17 – 2019-07-18 (×2): 20 mg via ORAL
  Filled 2019-07-17 (×2): qty 1

## 2019-07-17 MED ORDER — INSULIN ASPART 100 UNIT/ML ~~LOC~~ SOLN
4.0000 [IU] | Freq: Three times a day (TID) | SUBCUTANEOUS | Status: DC
  Administered 2019-07-17 – 2019-07-18 (×3): 4 [IU] via SUBCUTANEOUS

## 2019-07-17 MED ORDER — BOOST / RESOURCE BREEZE PO LIQD CUSTOM
1.0000 | Freq: Three times a day (TID) | ORAL | Status: DC
  Administered 2019-07-17: 1 via ORAL

## 2019-07-17 MED ORDER — ONDANSETRON HCL 4 MG PO TABS: 4.0000 mg | ORAL_TABLET | Freq: Four times a day (QID) | ORAL | 0 refills | Status: DC | PRN

## 2019-07-17 MED ORDER — METOCLOPRAMIDE HCL 5 MG/ML IJ SOLN
5.0000 mg | Freq: Three times a day (TID) | INTRAMUSCULAR | Status: DC
  Administered 2019-07-17 – 2019-07-18 (×3): 5 mg via INTRAVENOUS
  Filled 2019-07-17 (×3): qty 2

## 2019-07-17 MED ORDER — BUPROPION HCL ER (SR) 150 MG PO TB12
150.0000 mg | ORAL_TABLET | Freq: Two times a day (BID) | ORAL | Status: DC
  Administered 2019-07-18: 150 mg via ORAL
  Filled 2019-07-17 (×2): qty 1

## 2019-07-17 MED ORDER — SODIUM CHLORIDE 0.9 % IV SOLN
INTRAVENOUS | Status: DC
  Administered 2019-07-17: 17:00:00 via INTRAVENOUS

## 2019-07-17 MED ORDER — ACETAMINOPHEN 650 MG RE SUPP: 650.0000 mg | Freq: Four times a day (QID) | RECTAL | Status: DC | PRN

## 2019-07-17 MED ORDER — GABAPENTIN 300 MG PO CAPS
600.0000 mg | ORAL_CAPSULE | Freq: Every day | ORAL | Status: DC
  Administered 2019-07-17: 600 mg via ORAL
  Filled 2019-07-17: qty 2

## 2019-07-17 MED ORDER — LORAZEPAM 0.5 MG PO TABS
0.5000 mg | ORAL_TABLET | Freq: Three times a day (TID) | ORAL | Status: DC | PRN
  Administered 2019-07-17: 0.5 mg via ORAL
  Filled 2019-07-17: qty 1

## 2019-07-17 MED ORDER — INSULIN ASPART 100 UNIT/ML ~~LOC~~ SOLN
0.0000 [IU] | Freq: Every day | SUBCUTANEOUS | Status: DC
  Administered 2019-07-17: 2 [IU] via SUBCUTANEOUS

## 2019-07-17 MED ORDER — INSULIN ASPART 100 UNIT/ML ~~LOC~~ SOLN
0.0000 [IU] | SUBCUTANEOUS | Status: DC
  Administered 2019-07-17: 8 [IU] via SUBCUTANEOUS
  Administered 2019-07-17: 5 [IU] via SUBCUTANEOUS
  Administered 2019-07-18 (×2): 3 [IU] via SUBCUTANEOUS

## 2019-07-17 MED ORDER — ONDANSETRON HCL 4 MG/2ML IJ SOLN
4.0000 mg | Freq: Once | INTRAMUSCULAR | Status: AC
  Administered 2019-07-17: 11:00:00 4 mg via INTRAVENOUS
  Filled 2019-07-17: qty 2

## 2019-07-17 NOTE — Assessment & Plan Note (Signed)
Associated with nausea vomiting and diarrhea as discussed above.

## 2019-07-17 NOTE — Assessment & Plan Note (Addendum)
48 year old male with past medical history significant for HTN, diabetes, hemangioma of the liver, right upper quadrant abdominal pain and nausea who was last seen by our office in 2018.  He presents today with a constellation of GI symptoms including postprandial diarrhea that started 1-1.5 months ago now with worsening GI symptoms for the last 2-3 weeks with frequent watery/mucousy stools (6-7 a day), nausea, vomiting (6-7 episodes a day), abdominal pain with intermittent " cutting feeling" at various places in his abdomen, and worsening lightheadedness and dizziness today. Was seen in the ED for the same on 07/08/2019.  Lipase normal, glucose elevated at 310, sodium 134, potassium and kidney function normal, total bilirubin 1.3, LFTs normal, WBC normal, hemoglobin normal.  CT unrevealing.  He was prescribed Lomotil and Zofran which he is taking daily without any significant improvement.  Denies hematemesis or melena.  Admits to intermittent tissue hematochezia with a lot of rectal burning due to frequent diarrhea and wiping.  Also reporting 30-40 pound weight loss in the last few weeks.  The only recent weight I have to compare to is from 07/08/19.  He has lost 4 pounds in the last week.   Denies recent antibiotic, sick contacts, or hospitalizations.  Glucose has been elevated due to inability to keep diabetes medications down.  Exam with diffuse moderate tenderness to palpation. Colonoscopy up-to-date in 2017 with diverticulosis throughout, otherwise normal with recommendations to repeat in 2027.   Need to rule out infectious diarrhea. Patient could have underlying diabetic enteropathy or IBS (he has reported alternating constipation and diarrhea in the past) that was worsened by an acute viral gastroenteritis with development of postinfectious IBS.  Would not suspect diarrhea to be related to IBD with normal TCS 3 years ago.  Nausea and vomiting may have also been secondary to an acute viral gastroenteritis  with postinfectious gastroparesis versus exacerbation of underlying gastroparesis in the setting of diabetes.  COVID-19 also on the differential with nausea, vomiting, nasal congestion, and postnasal drainage x2 weeks.   As patient's blood pressure is soft at 96/71, associated lightheadedness and dizziness, Zofran not working well enough to allow adequate oral intake, I am sending the patient to the emergency room for further evaluation and supportive care.  Spoke with charge nurse to [facility] emergency department.  He does need to complete stool studies for C. difficile and GI pathogen panel which I will place orders for.   Advised to stop Lomotil for now until we rule out infectious etiology of diarrhea. Further recommendations to follow.

## 2019-07-17 NOTE — ED Triage Notes (Addendum)
Pt sent from [facility] due to continued abd pain, vomiting and loose stools. Pt seen here on 07/08/19. NP at office reports coughing up increase mucous

## 2019-07-17 NOTE — H&P (Signed)
History and Physical    [name] Gregory [id number] DOB: 03/06/1971 DOA: 07/17/2019  PCP: Vidal Schwalbe, MD   Patient coming from: GI office  Chief Complaint: Persistent N/V/D with presyncope  HPI: [name] Gregory is a 48 y.o. male with medical history significant for poorly controlled diabetes, liver hemangioma, hypertension, tobacco abuse, obesity, sleep apnea, GERD, and likely gastroparesis who was seen at his gastroenterologist office earlier today due to persistent GI symptoms of nausea vomiting and diarrhea.  Today, however he stated he had some lightheadedness and dizziness that had been intermittent.  He denies any loss of consciousness.  He a was also noted to have postprandial diarrhea and has had overall poor oral intake on account of his symptoms.  He has watery stools that are persistent with 6-7 bowel movements a day he has been taking Lomotil Zofran without much relief of his symptoms and has lost approximately 30 pounds in the last few weeks.  He denies any sick contacts or recent antibiotics, no fevers or chills noted.  No chest pain or dyspnea or rhinorrhea noted.  He has been drinking a lot of ginger ale and Gatorade and is noted to have elevated blood glucose readings.   ED Course: Vital signs are stable and he was initially noted to have softer blood pressure readings which have now improved after 2 L fluid bolus.  He has also been given some Zofran with some relief.  CT of the head with no acute findings noted and urine analysis within normal limits.  Covid testing is still pending.  He has minimal leukocytosis of 11,300 and lab work demonstrates sodium of 128 and glucose of 370.  Creatinine is 1.59 with baseline between 0.5-0.7.  He is noted to have some minimal AST, ALT, and bilirubin elevations as well.  Chest x-ray with no acute findings.  Review of Systems: All others reviewed as noted above and otherwise negative.  Past Medical History:  Diagnosis Date   Complication of  anesthesia    WOKE UP DURING SURGERY   Diabetes ([place])    Hemangioma of liver    Hypertension    Sleep apnea    URI (upper respiratory infection)     Past Surgical History:  Procedure Laterality Date   BIOPSY  05/04/2016   Procedure: BIOPSY;  Surgeon: Daneil Dolin, MD;  [facility];  Service: Endoscopy;;  gastric bx   COLONOSCOPY WITH PROPOFOL N/A 05/04/2016   Procedure: COLONOSCOPY WITH PROPOFOL;  Surgeon: Daneil Dolin, MD;  [facility];  Service: Endoscopy;  Laterality: N/A;  9:45 AM   ESOPHAGOGASTRODUODENOSCOPY (EGD) WITH PROPOFOL N/A 05/04/2016   Procedure: ESOPHAGOGASTRODUODENOSCOPY (EGD) WITH PROPOFOL;  Surgeon: Daneil Dolin, MD;  [facility];  Service: Endoscopy;  Laterality: N/A;   EYE SURGERY     SALIVARY GLAND SURGERY  1992     reports that he has been smoking cigarettes. He has a 9.00 pack-year smoking history. He has never used smokeless tobacco. He reports that he does not drink alcohol or use drugs.  No Known Allergies  Family History  Problem Relation Age of Onset   Hypertension Mother    CVA Father    Diabetes Father    Throat cancer Father    Heart disease Brother 12   Cervical cancer Maternal Grandmother    Throat cancer Maternal Grandmother    Throat cancer Maternal Grandfather    Bladder Cancer Maternal Grandfather    Colon cancer Neg Hx  Prior to Admission medications   Medication Sig Start Date End Date Taking? Authorizing Provider  amLODipine (NORVASC) 10 MG tablet Take 10 mg by mouth daily. 03/11/16  Yes [provider]  buPROPion (ZYBAN) 150 MG 12 hr tablet Take 150 mg by mouth 2 (two) times daily.   Yes [provider]  carvedilol (COREG) 25 MG tablet Take 25 mg by mouth 2 (two) times daily with a meal.   Yes [provider]  citalopram (CELEXA) 20 MG tablet Take 1 tablet by mouth daily. 04/24/17  Yes [provider]  diphenoxylate-atropine (LOMOTIL)  2.5-0.025 MG tablet Take 1-2 tablets by mouth 4 (four) times daily as needed for diarrhea or loose stools. 07/09/19  Yes Pollina, Gwenyth Allegra, MD  gabapentin (NEURONTIN) 300 MG capsule Take 300 mg by mouth 2 (two) times daily. Take one capsule by mouth in the morning and two at bedtime for nerve pain    Yes [provider]  glimepiride (AMARYL) 2 MG tablet Take 1 tablet (2 mg total) by mouth daily before breakfast. 03/18/16  Yes Wendie Agreste, MD  irbesartan-hydrochlorothiazide (AVALIDE) 300-12.5 MG tablet Take 1 tablet by mouth daily. 05/06/17  Yes [provider]  LORazepam (ATIVAN) 0.5 MG tablet Take 0.5 mg by mouth every 8 (eight) hours as needed for anxiety.    Yes [provider]  metFORMIN (GLUCOPHAGE-XR) 500 MG 24 hr tablet Take 1 tablet by mouth daily. 04/21/17  Yes [provider]  Naproxen-Esomeprazole (VIMOVO) 500-20 MG TBEC Take by mouth daily.   Yes [provider]  omeprazole (PRILOSEC) 20 MG capsule Take 20 mg by mouth daily.   Yes [provider]  ondansetron (ZOFRAN) 4 MG tablet Take 1 tablet (4 mg total) by mouth every 6 (six) hours as needed for nausea or vomiting. 07/17/19  Yes Erenest Rasher, PA-C  Vitamin D, Ergocalciferol, (DRISDOL) 50000 units CAPS capsule Take 1 capsule by mouth 3 (three) times a week. 02/03/16   [provider]    Physical Exam: Vitals:   07/17/19 1100 07/17/19 1115 07/17/19 1130 07/17/19 1200  BP: 102/83  116/82 109/84  Pulse: 74 78 72 71  Resp: 18 16    Temp:      TempSrc:      SpO2: 98% 99% 100% 100%  Weight:        Constitutional: NAD, calm, comfortable Vitals:   07/17/19 1100 07/17/19 1115 07/17/19 1130 07/17/19 1200  BP: 102/83  116/82 109/84  Pulse: 74 78 72 71  Resp: 18 16    Temp:      TempSrc:      SpO2: 98% 99% 100% 100%  Weight:       Eyes: lids and conjunctivae normal ENMT: Mucous membranes are moist.  Neck: normal, supple Respiratory: clear to auscultation  bilaterally. Normal respiratory effort. No accessory muscle use.  Currently on room air. Cardiovascular: Regular rate and rhythm, no murmurs. No extremity edema. Abdomen: no tenderness, no distention. Bowel sounds positive.  Musculoskeletal:  No joint deformity upper and lower extremities.   Skin: no rashes, lesions, ulcers.  Psychiatric: Normal judgment and insight. Alert and oriented x 3. Normal mood.   Labs on Admission: I have personally reviewed following labs and imaging studies  CBC: Recent Labs  Lab 07/17/19 1027  WBC 11.3*  HGB 14.8  HCT 46.1  MCV 76.1*  PLT [id number]   Basic Metabolic Panel: Recent Labs  Lab 07/17/19 1027  NA 128*  K 4.4  CL 92*  CO2 23  GLUCOSE 370*  BUN 14  CREATININE 1.59*  CALCIUM 8.9   GFR: Estimated Creatinine Clearance: 81.9 mL/min (A) (by C-G formula based on SCr of 1.59 mg/dL (H)). Liver Function Tests: Recent Labs  Lab 07/17/19 1027  AST 136*  ALT 127*  ALKPHOS 98  BILITOT 2.6*  PROT 6.5  ALBUMIN 3.6   Recent Labs  Lab 07/17/19 1027  LIPASE 19   No results for input(s): AMMONIA in the last 168 hours. Coagulation Profile: No results for input(s): INR, PROTIME in the last 168 hours. Cardiac Enzymes: No results for input(s): CKTOTAL, CKMB, CKMBINDEX, TROPONINI in the last 168 hours. BNP (last 3 results) No results for input(s): PROBNP in the last 8760 hours. HbA1C: No results for input(s): HGBA1C in the last 72 hours. CBG: No results for input(s): GLUCAP in the last 168 hours. Lipid Profile: No results for input(s): CHOL, HDL, LDLCALC, TRIG, CHOLHDL, LDLDIRECT in the last 72 hours. Thyroid Function Tests: No results for input(s): TSH, T4TOTAL, FREET4, T3FREE, THYROIDAB in the last 72 hours. Anemia Panel: No results for input(s): VITAMINB12, FOLATE, FERRITIN, TIBC, IRON, RETICCTPCT in the last 72 hours. Urine analysis:    Component Value Date/Time   COLORURINE AMBER (A) 07/17/2019 1001   APPEARANCEUR HAZY (A) 07/17/2019  1001   LABSPEC 1.010 07/17/2019 1001   PHURINE 6.0 07/17/2019 1001   GLUCOSEU >=500 (A) 07/17/2019 1001   HGBUR SMALL (A) 07/17/2019 1001   BILIRUBINUR NEGATIVE 07/17/2019 1001   BILIRUBINUR negative 03/18/2016 1359   KETONESUR NEGATIVE 07/17/2019 1001   PROTEINUR 100 (A) 07/17/2019 1001   UROBILINOGEN 1.0 03/18/2016 1359   NITRITE NEGATIVE 07/17/2019 1001   LEUKOCYTESUR NEGATIVE 07/17/2019 1001    Radiological Exams on Admission: Ct Head Wo Contrast  Result Date: 07/17/2019 CLINICAL DATA:  Focal neuro deficit, less than 6 hours, stroke suspected. Additional history provided: Nausea and vomiting with diarrhea for 2 weeks with dizziness. EXAM: CT HEAD WITHOUT CONTRAST TECHNIQUE: Contiguous axial images were obtained from the base of the skull through the vertex without intravenous contrast. COMPARISON:  MRI/MRA head 12/16/2008, head CT 12/15/2008 FINDINGS: Brain: No evidence of acute intracranial hemorrhage. No demarcated cortical infarction. No evidence of intracranial mass. No midline shift or extra-axial fluid collection. Vascular: No hyperdense vessel.  Atherosclerotic calcifications. Skull: Normal. Negative for fracture or focal lesion. Sinuses/Orbits: Visualized orbits demonstrate no acute abnormality. Other: Minimal partial opacification of right ethmoid air cells. No significant mastoid effusion. IMPRESSION: No CT evidence of acute intracranial abnormality. Electronically Signed   By: Kellie Simmering DO   On: 07/17/2019 11:44   Dg Chest Portable 1 View  Result Date: 07/17/2019 CLINICAL DATA:  Nausea, vomiting, diarrhea and weakness over the past 2 weeks. EXAM: PORTABLE CHEST 1 VIEW COMPARISON:  PA and lateral chest 01/06/2018. FINDINGS: The lungs are clear. Heart size is normal. No pneumothorax or pleural fluid. No acute or focal bony abnormality. IMPRESSION: Negative chest. Electronically Signed   By: Inge Rise M.D.   On: 07/17/2019 12:34    Assessment/Plan Active Problems:    AKI (acute kidney injury) ([place])    Presyncope likely secondary to orthostasis/dehydration -Continue IV fluid hydration -Recheck orthostatics in a.m.  AKI likely secondary to prerenal causes with dehydration -Continue IV fluid -Track intake and output -Avoid nephrotoxic agents -Follow-up renal panel in a.m.  Persistent nausea and vomiting likely related to diabetic gastroparesis -We will continue on low-dose IV Reglan which patient states seems to help; I do not see this on his home  med rec, however -Zofran as needed -Clear liquid diet for now and advance as tolerated -Appreciate GI evaluation  Mild transaminitis likely related to above -Abdominal ultrasound -Recent CT performed 11/28 without significant findings -Trend in a.m.  Persistent diarrhea -Stool studies with GI panel and C. difficile per GI -Appreciate GI evaluation -Hold Lomotil for now as this does not appear to be helping him  Hyperglycemia in the setting of type 2 diabetes -Patient noted to have significant consumption of sugary beverages -We will place on SSI every 4 hours and monitor carefully -Hold oral agents for now  Hypertension-soft -Currently stable -Hold home oral agents -Hydralazine as needed for significant elevation  GERD -PPI IV daily  Tobacco abuse -Counseled on cessation -Patient is on bupropion and have offered nicotine patch  Obesity -Counseling for nutrition in the outpatient setting  DVT prophylaxis: Heparin Code Status: Full Family Communication: None at bedside Disposition Plan: Admit for IV fluid hydration and stabilization of oral intake, diarrhea evaluation Consults called: GI Admission status: Inpatient, MedSurg   [place] Darleen Crocker DO [facility] Pager [phone]  If 7PM-7AM, please contact night-coverage [email or web address] Password [email or web address]  07/17/2019, 1:07 PM

## 2019-07-17 NOTE — ED Provider Notes (Addendum)
[facility] EMERGENCY DEPARTMENT Provider Note   CSN: [id number] Arrival date & time: 07/17/19  0901     History   Chief Complaint Chief Complaint  Patient presents with  . Abdominal Pain    HPI [name] Gregory is a 48 y.o. male.     Multiple episodes of nausea, vomiting, diarrhea for 2 weeks.  Seen by gastroenterologist earlier today and sent to the emergency department.  The symptoms are unusual.  Seen in emergency department 07/08/2019 with similar symptoms.  CT scanning at that time showed no acute findings other than diverticular disease without diverticulitis.  Severity is moderate/severe.  Nothing makes symptoms better or worse.  Additional history obtained from wife Conley Rolls [phone]).  She states he was unsteady this morning while attempting to get gasoline at the pump.  Other medical conditions include hypertension, diabetes (noncompliant with meds, recent hemoglobin A1c greater than 7), hemangioma of liver, obesity.     Past Medical History:  Diagnosis Date  . Complication of anesthesia    WOKE UP DURING SURGERY  . Diabetes ([place])   . Hemangioma of liver   . Hypertension   . Sleep apnea   . URI (upper respiratory infection)     Patient Active Problem List   Diagnosis Date Noted  . OSA on CPAP 06/14/2017  . Morbid (severe) obesity due to excess calories ([place]) 06/14/2017  . Abdominal pain, epigastric 04/24/2016  . RUQ pain 04/24/2016  . Microcytic anemia 04/24/2016  . Diarrhea 04/24/2016  . Elevated sed rate 04/24/2016  . Splenomegaly 04/24/2016    Past Surgical History:  Procedure Laterality Date  . BIOPSY  05/04/2016   Procedure: BIOPSY;  Surgeon: Daneil Dolin, MD;  [facility];  Service: Endoscopy;;  gastric bx  . COLONOSCOPY WITH PROPOFOL N/A 05/04/2016   Procedure: COLONOSCOPY WITH PROPOFOL;  Surgeon: Daneil Dolin, MD;  [facility];  Service: Endoscopy;  Laterality: N/A;  9:45 AM  . ESOPHAGOGASTRODUODENOSCOPY (EGD) WITH  PROPOFOL N/A 05/04/2016   Procedure: ESOPHAGOGASTRODUODENOSCOPY (EGD) WITH PROPOFOL;  Surgeon: Daneil Dolin, MD;  [facility];  Service: Endoscopy;  Laterality: N/A;  . EYE SURGERY    . SALIVARY GLAND SURGERY  1992        Home Medications    Prior to Admission medications   Medication Sig Start Date End Date Taking? Authorizing Provider  amLODipine (NORVASC) 10 MG tablet Take 10 mg by mouth daily. 03/11/16  Yes [provider]  buPROPion (ZYBAN) 150 MG 12 hr tablet Take 150 mg by mouth 2 (two) times daily.   Yes [provider]  carvedilol (COREG) 25 MG tablet Take 25 mg by mouth 2 (two) times daily with a meal.   Yes [provider]  citalopram (CELEXA) 20 MG tablet Take 1 tablet by mouth daily. 04/24/17  Yes [provider]  diphenoxylate-atropine (LOMOTIL) 2.5-0.025 MG tablet Take 1-2 tablets by mouth 4 (four) times daily as needed for diarrhea or loose stools. 07/09/19  Yes Pollina, Gwenyth Allegra, MD  gabapentin (NEURONTIN) 300 MG capsule Take 300 mg by mouth 2 (two) times daily. Take one capsule by mouth in the morning and two at bedtime for nerve pain    Yes [provider]  glimepiride (AMARYL) 2 MG tablet Take 1 tablet (2 mg total) by mouth daily before breakfast. 03/18/16  Yes Wendie Agreste, MD  irbesartan-hydrochlorothiazide (AVALIDE) 300-12.5 MG tablet Take 1 tablet by mouth daily. 05/06/17  Yes [provider]  LORazepam (ATIVAN) 0.5 MG tablet Take 0.5 mg by mouth every 8 (eight) hours as needed for anxiety.    Yes [provider]  metFORMIN (GLUCOPHAGE-XR) 500 MG 24 hr tablet Take 1 tablet by mouth daily. 04/21/17  Yes [provider]  Naproxen-Esomeprazole (VIMOVO) 500-20 MG TBEC Take by mouth daily.   Yes [provider]  omeprazole (PRILOSEC) 20 MG capsule Take 20 mg by mouth daily.   Yes [provider]  ondansetron (ZOFRAN) 4 MG tablet Take 1 tablet (4 mg total) by mouth  every 6 (six) hours as needed for nausea or vomiting. 07/17/19  Yes Erenest Rasher, PA-C  Vitamin D, Ergocalciferol, (DRISDOL) 50000 units CAPS capsule Take 1 capsule by mouth 3 (three) times a week. 02/03/16   [provider]    Family History Family History  Problem Relation Age of Onset  . Hypertension Mother   . CVA Father   . Diabetes Father   . Throat cancer Father   . Heart disease Brother 63  . Cervical cancer Maternal Grandmother   . Throat cancer Maternal Grandmother   . Throat cancer Maternal Grandfather   . Bladder Cancer Maternal Grandfather   . Colon cancer Neg Hx     Social History Social History   Tobacco Use  . Smoking status: Current Every Day Smoker    Packs/day: 0.25    Years: 36.00    Pack years: 9.00    Types: Cigarettes  . Smokeless tobacco: Never Used  . Tobacco comment: 4cigs a day as of 07/19/17 ep  Substance Use Topics  . Alcohol use: No  . Drug use: No     Allergies   Patient has no known allergies.   Review of Systems Review of Systems  All other systems reviewed and are negative.    Physical Exam Updated Vital Signs BP 116/82   Pulse 72   Temp 97.8 F (36.6 C) (Oral)   Resp 16   Wt 126 kg   SpO2 100%   BMI 35.66 kg/m   Physical Exam Vitals signs and nursing note reviewed.  Constitutional:      Comments: Alert, appears unwell  HENT:     Head: Normocephalic and atraumatic.  Eyes:     Conjunctiva/sclera: Conjunctivae normal.  Neck:     Musculoskeletal: Neck supple.  Cardiovascular:     Rate and Rhythm: Normal rate and regular rhythm.  Pulmonary:     Effort: Pulmonary effort is normal.     Breath sounds: Normal breath sounds.  Abdominal:     General: Bowel sounds are normal.     Palpations: Abdomen is soft.     Comments: Min generalized tenderness  Musculoskeletal: Normal range of motion.  Skin:    General: Skin is warm and dry.  Neurological:     General: No focal deficit present.     Mental Status:  He is oriented to person, place, and time.  Psychiatric:        Behavior: Behavior normal.      ED Treatments / Results  Labs (all labs ordered are listed, but only abnormal results are displayed) Labs Reviewed  COMPREHENSIVE METABOLIC PANEL - Abnormal; Notable for the following components:      Result Value   Sodium 128 (*)    Chloride 92 (*)    Glucose, Bld 370 (*)    Creatinine, Ser 1.59 (*)    AST 136 (*)    ALT 127 (*)    Total Bilirubin 2.6 (*)  GFR calc non Af Amer 51 (*)    GFR calc Af Amer 59 (*)    All other components within normal limits  CBC - Abnormal; Notable for the following components:   WBC 11.3 (*)    RBC 6.06 (*)    MCV 76.1 (*)    MCH 24.4 (*)    All other components within normal limits  URINALYSIS, ROUTINE W REFLEX MICROSCOPIC - Abnormal; Notable for the following components:   Color, Urine AMBER (*)    APPearance HAZY (*)    Glucose, UA >=500 (*)    Hgb urine dipstick SMALL (*)    Protein, ur 100 (*)    Bacteria, UA RARE (*)    All other components within normal limits  SARS CORONAVIRUS 2 (TAT 6-24 HRS)  LIPASE, BLOOD    EKG None  Radiology Ct Head Wo Contrast  Result Date: 07/17/2019 CLINICAL DATA:  Focal neuro deficit, less than 6 hours, stroke suspected. Additional history provided: Nausea and vomiting with diarrhea for 2 weeks with dizziness. EXAM: CT HEAD WITHOUT CONTRAST TECHNIQUE: Contiguous axial images were obtained from the base of the skull through the vertex without intravenous contrast. COMPARISON:  MRI/MRA head 12/16/2008, head CT 12/15/2008 FINDINGS: Brain: No evidence of acute intracranial hemorrhage. No demarcated cortical infarction. No evidence of intracranial mass. No midline shift or extra-axial fluid collection. Vascular: No hyperdense vessel.  Atherosclerotic calcifications. Skull: Normal. Negative for fracture or focal lesion. Sinuses/Orbits: Visualized orbits demonstrate no acute abnormality. Other: Minimal partial  opacification of right ethmoid air cells. No significant mastoid effusion. IMPRESSION: No CT evidence of acute intracranial abnormality. Electronically Signed   By: Kellie Simmering DO   On: 07/17/2019 11:44    Procedures Procedures (including critical care time)  Medications Ordered in ED Medications  sodium chloride 0.9 % bolus 1,000 mL (has no administration in time range)  ondansetron (ZOFRAN) injection 4 mg (4 mg Intravenous Given 07/17/19 1039)  sodium chloride 0.9 % bolus 1,000 mL (1,000 mLs Intravenous New Bag/Given 07/17/19 1038)     Initial Impression / Assessment and Plan / ED Course  I have reviewed the triage vital signs and the nursing notes.  Pertinent labs & imaging results that were available during my care of the patient were reviewed by me and considered in my medical decision making (see chart for details).       Will hydrate, basic labs, CT head, possible admission.  12 00: Recheck.  Feeling much better.  Multiple anomalies noted on work-up including hyperglycemia, elevated creatinine, elevated liver enzymes.  Discussed with hospitalist.  Admit. CRITICAL CARE Performed by: Nat Christen Total critical care time: 30 minutes Critical care time was exclusive of separately billable procedures and treating other patients. Critical care was necessary to treat or prevent imminent or life-threatening deterioration. Critical care was time spent personally by me on the following activities: development of treatment plan with patient and/or surrogate as well as nursing, discussions with consultants, evaluation of patient's response to treatment, examination of patient, obtaining history from patient or surrogate, ordering and performing treatments and interventions, ordering and review of laboratory studies, ordering and review of radiographic studies, pulse oximetry and re-evaluation of patient's condition.  Final Clinical Impressions(s) / ED Diagnoses   Final diagnoses:  Nausea  vomiting and diarrhea  Dizziness  Hyperglycemia  AKI (acute kidney injury) ([place])  Liver enzyme elevation    ED Discharge Orders    None       Nat Christen, MD 07/17/19 1025  Nat Christen, MD 07/17/19 1124    Nat Christen, MD 07/17/19 1124    Nat Christen, MD 07/17/19 [phone]

## 2019-07-17 NOTE — Assessment & Plan Note (Addendum)
Nausea, vomiting, watery diarrhea with mucus, and abdominal pain x2-3 weeks with low-grade fever at the onset that has resolved. Prior to onset of nausea and vomiting, patient reports 1 to 1.30-month history of postprandial diarrhea.  However, over the last 2-3 weeks, patient has had 6-7 watery BMs daily, intermittent nausea daily with 6-7 episodes of emesis daily.  No hematemesis or melena.  Admits to bright red blood on toilet tissue with rectal burning secondary to frequent diarrhea and wiping.  Recently seen in the emergency department on 07/08/2019 with CT and labs unrevealing.  His glucose at that time was elevated at 310, sodium slightly low at 134, total bilirubin slightly elevated at 1.3, otherwise no significant findings. He was prescribed Zofran and Lomotil which he has been taking daily without any significant improvement in symptoms.  Not able to maintain adequate oral intake. Having worsening lightheadedness and dizziness today with soft blood pressures at 96/71.  Lost 4 pounds in the last week.  Denies sick contacts.  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  Patient may also have component of diabetic gastroparesis as well as he has had chronic nausea in the past with EGD in 2017 unrevealing.  His elevated glucose is likely worsening his gastric emptying and his upper GI symptoms as he is not always able to keep his medications down.  Still need to rule out infectious diarrhea as a cause of his constellation of symptoms.  COVID-19 also on the differential patient also reports 2-week history of nasal congestion and postnasal drainage. .   Due to soft blood pressures, lightheadedness, dizziness, and patients inability to maintain adequate oral hydration with concern of electrolyte abnormalities and dehydration, I am sending patient to the emergency department for further evaluation and supportive measures.    Further recommendations to follow ED evaluation.

## 2019-07-17 NOTE — Patient Instructions (Addendum)
Proceed to [facility] ED for fluids, labs. They may also repeat imaging of your abdomen.   I am sending in a refill of Zofran for you.   Please stop Lomotil for now. I would like you to complete stool studies first to rule out infectious process.   We will have further recommendations to follow.  Continue bland diet for now.   You may continue omeprazole for now.    Use a protective barrier on your bottom to help with the burning. Something like zinc oxide, a and d ointment etc.   Aliene Altes, PA-C [facility]

## 2019-07-17 NOTE — Assessment & Plan Note (Addendum)
Nasal congestion and postnasal drainage x2 weeks.  Also with worsening GI symptoms over the last 2-3 weeks with nausea, vomiting, and diarrhea not improving with Zofran or Lomotil as addressed above.  Admits to low-grade fever when symptoms first started.  I am sending patient to the emergency department due to his GI symptoms, inability to maintain adequate oral intake, soft blood pressures, and lightheadedness and dizziness.  I discussed patients case with charge nurse at [facility] and also advised them of his upper respiratory symptoms with possible need for Covid testing.

## 2019-07-18 ENCOUNTER — Telehealth: Payer: Self-pay | Admitting: Nurse Practitioner

## 2019-07-18 DIAGNOSIS — R42 Dizziness and giddiness: Secondary | ICD-10-CM

## 2019-07-18 DIAGNOSIS — R739 Hyperglycemia, unspecified: Secondary | ICD-10-CM

## 2019-07-18 DIAGNOSIS — R7401 Elevation of levels of liver transaminase levels: Secondary | ICD-10-CM

## 2019-07-18 DIAGNOSIS — N179 Acute kidney failure, unspecified: Principal | ICD-10-CM

## 2019-07-18 DIAGNOSIS — R112 Nausea with vomiting, unspecified: Secondary | ICD-10-CM

## 2019-07-18 DIAGNOSIS — R197 Diarrhea, unspecified: Secondary | ICD-10-CM

## 2019-07-18 DIAGNOSIS — R748 Abnormal levels of other serum enzymes: Secondary | ICD-10-CM

## 2019-07-18 LAB — COMPREHENSIVE METABOLIC PANEL
ALT: 85 U/L — ABNORMAL HIGH (ref 0–44)
AST: 62 U/L — ABNORMAL HIGH (ref 15–41)
Albumin: 3.1 g/dL — ABNORMAL LOW (ref 3.5–5.0)
Alkaline Phosphatase: 76 U/L (ref 38–126)
Anion gap: 9 (ref 5–15)
BUN: 11 mg/dL (ref 6–20)
CO2: 24 mmol/L (ref 22–32)
Calcium: 8.1 mg/dL — ABNORMAL LOW (ref 8.9–10.3)
Chloride: 98 mmol/L (ref 98–111)
Creatinine, Ser: 0.84 mg/dL (ref 0.61–1.24)
GFR calc Af Amer: >60 mL/min (ref >60)
GFR calc non Af Amer: >60 mL/min (ref >60)
Glucose, Bld: 145 mg/dL — ABNORMAL HIGH (ref 70–99)
Potassium: 3.9 mmol/L (ref 3.5–5.1)
Sodium: 131 mmol/L — ABNORMAL LOW (ref 135–145)
Total Bilirubin: 1.8 mg/dL — ABNORMAL HIGH (ref 0.3–1.2)
Total Protein: 6 g/dL — ABNORMAL LOW (ref 6.5–8.1)

## 2019-07-18 LAB — CBC
HCT: 42.7 % (ref 39.0–52.0)
Hemoglobin: 13.5 g/dL (ref 13.0–17.0)
MCH: 24.4 pg — ABNORMAL LOW (ref 26.0–34.0)
MCHC: 31.6 g/dL (ref 30.0–36.0)
MCV: 77.2 fL — ABNORMAL LOW (ref 80.0–100.0)
Platelets: 244 10*3/uL (ref 150–400)
RBC: 5.53 MIL/uL (ref 4.22–5.81)
RDW: 12.4 % (ref 11.5–15.5)
WBC: 10.1 10*3/uL (ref 4.0–10.5)
nRBC: 0 % (ref 0.0–0.2)

## 2019-07-18 LAB — GLUCOSE, CAPILLARY
Glucose-Capillary: 178 mg/dL — ABNORMAL HIGH (ref 70–99)
Glucose-Capillary: 106 mg/dL — ABNORMAL HIGH (ref 70–99)
Glucose-Capillary: 107 mg/dL — ABNORMAL HIGH (ref 70–99)

## 2019-07-18 LAB — SARS CORONAVIRUS 2 (TAT 6-24 HRS): SARS Coronavirus 2: NEGATIVE

## 2019-07-18 LAB — MAGNESIUM: Magnesium: 2 mg/dL (ref 1.7–2.4)

## 2019-07-18 MED ORDER — LOPERAMIDE HCL 2 MG PO TABS: 2.0000 mg | ORAL_TABLET | Freq: Four times a day (QID) | ORAL | 0 refills | Status: DC | PRN

## 2019-07-18 MED ORDER — ONDANSETRON HCL 4 MG PO TABS: 4.0000 mg | ORAL_TABLET | Freq: Every day | ORAL | 1 refills | Status: DC | PRN

## 2019-07-18 MED ORDER — INSULIN ASPART 100 UNIT/ML ~~LOC~~ SOLN: 0.0000 [IU] | Freq: Every day | SUBCUTANEOUS | Status: DC

## 2019-07-18 MED ORDER — METOCLOPRAMIDE HCL 5 MG PO TABS: 5.0000 mg | ORAL_TABLET | Freq: Three times a day (TID) | ORAL | 1 refills | Status: DC

## 2019-07-18 MED ORDER — INSULIN ASPART 100 UNIT/ML ~~LOC~~ SOLN: 0.0000 [IU] | Freq: Three times a day (TID) | SUBCUTANEOUS | Status: DC

## 2019-07-18 NOTE — Discharge Summary (Signed)
Physician Discharge Summary  [name] Gregory [id number] DOB: 11-25-1970 DOA: 07/17/2019  PCP: Vidal Schwalbe, MD  Admit date: 07/17/2019  Discharge date: 07/18/2019  Admitted From:Home  Disposition:  Home  Recommendations for Outpatient Follow-up:  1. Follow up with PCP in 1-2 weeks 2. Follow-up with endocrinology as recommended in the next 2 weeks and work on better blood glucose control in the outpatient setting with close monitoring 3. Follow-up with GI in the next 4-6 weeks as scheduled 4. Continue on Reglan 3 times daily as prescribed for diabetic gastroparesis and eat smaller, more frequent meals 5. Zofran as needed for nausea or vomiting prescribed 6. Imodium for diarrhea prescribed  Home Health: None  Equipment/Devices: None  Discharge Condition: Stable  CODE STATUS: Full  Diet recommendation: Heart Healthy/carb modified  Brief/Interim Summary: Per [id number] D Arther is a 48 y.o. male with medical history significant for poorly controlled diabetes, liver hemangioma, hypertension, tobacco abuse, obesity, sleep apnea, GERD, and likely gastroparesis who was seen at his gastroenterologist office earlier today due to persistent GI symptoms of nausea vomiting and diarrhea.  Today, however he stated he had some lightheadedness and dizziness that had been intermittent.  He denies any loss of consciousness.  He a was also noted to have postprandial diarrhea and has had overall poor oral intake on account of his symptoms.  He has watery stools that are persistent with 6-7 bowel movements a day he has been taking Lomotil Zofran without much relief of his symptoms and has lost approximately 30 pounds in the last few weeks.  He denies any sick contacts or recent antibiotics, no fevers or chills noted.  No chest pain or dyspnea or rhinorrhea noted.  He has been drinking a lot of ginger ale and Gatorade and is noted to have elevated blood glucose readings.  12/4: Patient was admitted with some  symptoms of presyncope/orthostasis on account of dehydration related to poor oral intake from diabetic gastroparesis along with some diarrhea.  He was also noted to have findings of AKI.  His LFTs were also mildly elevated on admission.  His abdominal ultrasound did not demonstrate any acute findings and LFTs have down trended.  AKI is now resolved and patient is feeling back to baseline.  He has been tolerating clear liquid diet and is looking forward to lunch.  If well tolerated, he will be discharged back home today to follow-up with his PCP as well as GI as recommended in 4 to 6 weeks.  I have also recommended close follow-up to endocrinology as his A1c is over 9% and he will need tighter control to help prevent further GI symptoms.  Continue on medications as noted above.  No other acute events noted throughout the course of this brief admission.   Discharge Diagnoses:  Active Problems:   AKI (acute kidney injury) ([place])  Principal discharge diagnosis: Dehydration with poor intake secondary to diabetic gastroparesis with associated AKI.  Discharge Instructions  Discharge Instructions    Diet - low sodium heart healthy   Complete by: As directed    Increase activity slowly   Complete by: As directed      Allergies as of 07/18/2019   No Known Allergies     Medication List    TAKE these medications   amLODipine 10 MG tablet Commonly known as: NORVASC Take 10 mg by mouth daily.   buPROPion 150 MG 12 hr tablet Commonly known as: ZYBAN Take 150 mg by mouth 2 (two) times daily.   carvedilol  25 MG tablet Commonly known as: COREG Take 25 mg by mouth 2 (two) times daily with a meal.   citalopram 20 MG tablet Commonly known as: CELEXA Take 1 tablet by mouth daily.   diphenoxylate-atropine 2.5-0.025 MG tablet Commonly known as: Lomotil Take 1-2 tablets by mouth 4 (four) times daily as needed for diarrhea or loose stools.   gabapentin 300 MG capsule Commonly known as:  NEURONTIN Take 300 mg by mouth 2 (two) times daily. Take one capsule by mouth in the morning and two at bedtime for nerve pain   glimepiride 2 MG tablet Commonly known as: AMARYL Take 1 tablet (2 mg total) by mouth daily before breakfast.   irbesartan-hydrochlorothiazide 300-12.5 MG tablet Commonly known as: AVALIDE Take 1 tablet by mouth daily.   loperamide 2 MG tablet Commonly known as: Imodium A-D Take 1 tablet (2 mg total) by mouth 4 (four) times daily as needed for diarrhea or loose stools.   LORazepam 0.5 MG tablet Commonly known as: ATIVAN Take 0.5 mg by mouth every 8 (eight) hours as needed for anxiety.   metFORMIN 500 MG 24 hr tablet Commonly known as: GLUCOPHAGE-XR Take 1 tablet by mouth daily.   metoCLOPramide 5 MG tablet Commonly known as: Reglan Take 1 tablet (5 mg total) by mouth 3 (three) times daily.   omeprazole 20 MG capsule Commonly known as: PRILOSEC Take 20 mg by mouth daily.   ondansetron 4 MG tablet Commonly known as: ZOFRAN Take 1 tablet (4 mg total) by mouth every 6 (six) hours as needed for nausea or vomiting. What changed: Another medication with the same name was added. Make sure you understand how and when to take each.   ondansetron 4 MG tablet Commonly known as: Zofran Take 1 tablet (4 mg total) by mouth daily as needed for nausea or vomiting. What changed: You were already taking a medication with the same name, and this prescription was added. Make sure you understand how and when to take each.   Vimovo 500-20 MG Tbec Generic drug: Naproxen-Esomeprazole Take by mouth daily.   Vitamin D (Ergocalciferol) 1.25 MG (50000 UT) Caps capsule Commonly known as: DRISDOL Take 1 capsule by mouth 3 (three) times a week.      Follow-up Information    Vidal Schwalbe, MD Follow up in 1 week(s).   Specialty: Family Medicine Contact information: [address] [place] [address] [phone]        Daneil Dolin, MD Follow up in 4  week(s).   Specialty: Gastroenterology Contact information: [place] [address] [phone]        Cassandria Anger, MD. Schedule an appointment as soon as possible for a visit in 2 week(s).   Specialty: Endocrinology Contact information: [place] [address] [phone]          No Known Allergies  Consultations:  GI   Procedures/Studies: Ct Head Wo Contrast  Result Date: 07/17/2019 CLINICAL DATA:  Focal neuro deficit, less than 6 hours, stroke suspected. Additional history provided: Nausea and vomiting with diarrhea for 2 weeks with dizziness. EXAM: CT HEAD WITHOUT CONTRAST TECHNIQUE: Contiguous axial images were obtained from the base of the skull through the vertex without intravenous contrast. COMPARISON:  MRI/MRA head 12/16/2008, head CT 12/15/2008 FINDINGS: Brain: No evidence of acute intracranial hemorrhage. No demarcated cortical infarction. No evidence of intracranial mass. No midline shift or extra-axial fluid collection. Vascular: No hyperdense vessel.  Atherosclerotic calcifications. Skull: Normal. Negative for  fracture or focal lesion. Sinuses/Orbits: Visualized orbits demonstrate no acute abnormality. Other: Minimal partial opacification of right ethmoid air cells. No significant mastoid effusion. IMPRESSION: No CT evidence of acute intracranial abnormality. Electronically Signed   By: Kellie Simmering DO   On: 07/17/2019 11:44   US Abdomen Complete  Result Date: 07/17/2019 CLINICAL DATA:  Abdominal pain for 3 months. EXAM: ABDOMEN ULTRASOUND COMPLETE COMPARISON:  CT abdomen and pelvis 07/09/2019 and 03/31/2017. FINDINGS: Gallbladder: No gallstones or wall thickening visualized. No sonographic Murphy sign noted by sonographer. Common bile duct: Diameter: 0.3 cm Liver: Hemangioma in the right hepatic lobe is seen as on the prior examinations. Echogenicity is mildly increased consistent with fatty infiltration. Portal vein is  patent on color Doppler imaging with normal direction of blood flow towards the liver. IVC: No abnormality visualized. Pancreas: Visualized portion unremarkable. Spleen: Size and appearance within normal limits. Right Kidney: Length: 13.1 cm. Echogenicity within normal limits. No mass or hydronephrosis visualized. Left Kidney: Length: 13.7 cm. Echogenicity within normal limits. No mass or hydronephrosis visualized. Abdominal aorta: No aneurysm visualized. Other findings: None. IMPRESSION: No acute abnormality or finding to explain the patient's symptoms. Negative for gallstones. Fatty infiltration of the liver. Electronically Signed   By: Inge Rise M.D.   On: 07/17/2019 13:50   Ct Abdomen Pelvis W Contrast  Result Date: 07/09/2019 CLINICAL DATA:  Acute generalized abdominal pain, nausea, vomiting and diarrhea EXAM: CT ABDOMEN AND PELVIS WITH CONTRAST TECHNIQUE: Multidetector CT imaging of the abdomen and pelvis was performed using the standard protocol following bolus administration of intravenous contrast. CONTRAST:  155mL OMNIPAQUE IOHEXOL 300 MG/ML  SOLN COMPARISON:  CT abdomen pelvis March 31, 2017. Abdominal MR March 25 2016 FINDINGS: Lower chest: Lung bases are clear. Normal heart size. No pericardial effusion. Hepatobiliary: Redemonstration the previously characterized hemangioma in the anterior segment right lobe liver somewhat ill-defined on this exam due to contrast timing. Measures 3.5 by 2.2 cm in size, not significantly changed from comparison. No other focal hepatic abnormality. Gallbladder is unremarkable. No gallstones, gallbladder wall thickening, or biliary dilatation. Pancreas: Unremarkable. No pancreatic ductal dilatation or surrounding inflammatory changes. Spleen: Normal in size without focal abnormality. Adrenals/Urinary Tract: Normal adrenal glands. Mild bilateral symmetric perinephric stranding is similar to comparison exams. No concerning renal lesion. 9 mm hypoattenuating  lesion in the interpolar left kidney is compatible with cysts seen on prior MRI. Not significantly changed from comparison CT. No urolithiasis or hydronephrosis. Urinary bladder is unremarkable. Stomach/Bowel: Distal esophagus, stomach and duodenal sweep are unremarkable. No small bowel wall thickening or dilatation. No evidence of obstruction. A normal appendix is visualized. Appendix courses towards midline. Pancolonic diverticula without focal pericolonic inflammation to suggest diverticulitis. No colonic dilatation or wall thickening. Vascular/Lymphatic: Atherosclerotic plaque in the iliac arteries. No abdominal aortic aneurysm or ectasia. No suspicious or enlarged lymph nodes in the included lymphatic chains. Reproductive: The prostate and seminal vesicles are unremarkable. Other: No abdominopelvic free fluid or free gas. No bowel containing hernias. Musculoskeletal: No acute osseous abnormality or suspicious osseous lesion. Multilevel degenerative changes are present in the imaged portions of the spine. Features most pronounced L4-L5. IMPRESSION: 1. No acute intra-abdominal process. No explanation for the patient's symptoms. 2. Pancolonic diverticula without evidence of diverticulitis. 3. Stable hepatic hemangioma. 4. Stable left renal cyst. 5. Aortic Atherosclerosis (ICD10-I70.0). Electronically Signed   By: Lovena Le M.D.   On: 07/09/2019 01:31   Dg Chest Portable 1 View  Result Date: 07/17/2019 CLINICAL DATA:  Nausea, vomiting, diarrhea  and weakness over the past 2 weeks. EXAM: PORTABLE CHEST 1 VIEW COMPARISON:  PA and lateral chest 01/06/2018. FINDINGS: The lungs are clear. Heart size is normal. No pneumothorax or pleural fluid. No acute or focal bony abnormality. IMPRESSION: Negative chest. Electronically Signed   By: Inge Rise M.D.   On: 07/17/2019 12:34     Discharge Exam: Vitals:   07/18/19 0415 07/18/19 0416  BP: (!) 118/94 (!) 130/91  Pulse: 91 94  Resp:    Temp:    SpO2:  100% 99%   Vitals:   07/18/19 0412 07/18/19 0413 07/18/19 0415 07/18/19 0416  BP: (!) 134/95 (!) 134/101 (!) 118/94 (!) 130/91  Pulse: 83 78 91 94  Resp: 17 18    Temp:      TempSrc:      SpO2: 100% 100% 100% 99%  Weight:      Height:        General: Pt is alert, awake, not in acute distress Cardiovascular: RRR, S1/S2 +, no rubs, no gallops Respiratory: CTA bilaterally, no wheezing, no rhonchi Abdominal: Soft, NT, ND, bowel sounds + Extremities: no edema, no cyanosis    The results of significant diagnostics from this hospitalization (including imaging, microbiology, ancillary and laboratory) are listed below for reference.     Microbiology: Recent Results (from the past 240 hour(s))  SARS CORONAVIRUS 2 (TAT 6-24 HRS) Nasopharyngeal Nasopharyngeal Swab     Status: None   Collection Time: 07/17/19 10:21 AM   Specimen: Nasopharyngeal Swab  Result Value Ref Range Status   SARS Coronavirus 2 NEGATIVE NEGATIVE Final    Comment: (NOTE) SARS-CoV-2 target nucleic acids are NOT DETECTED. The SARS-CoV-2 RNA is generally detectable in upper and lower respiratory specimens during the acute phase of infection. Negative results do not preclude SARS-CoV-2 infection, do not rule out co-infections with other pathogens, and should not be used as the sole basis for treatment or other patient management decisions. Negative results must be combined with clinical observations, patient history, and epidemiological information. The expected result is Negative. Fact Sheet for Patients: SugarRoll.be Fact Sheet for Healthcare Providers: https://www.woods-mathews.com/ This test is not yet approved or cleared by the [place] FDA and  has been authorized for detection and/or diagnosis of SARS-CoV-2 by FDA under an Emergency Use Authorization (EUA). This EUA will remain  in effect (meaning this test can be used) for the duration of the COVID-19  declaration under Section 56 4(b)(1) of the Act, 21 U.S.C. section 360bbb-3(b)(1), unless the authorization is terminated or revoked sooner. Performed at [facility], [place] [address], [place], [place] [address]   C difficile quick scan w PCR reflex     Status: None   Collection Time: 07/17/19  1:06 PM   Specimen: Stool  Result Value Ref Range Status   C Diff antigen NEGATIVE NEGATIVE Final   C Diff toxin NEGATIVE NEGATIVE Final   C Diff interpretation No C. difficile detected.  Final    Comment: Performed at [facility], [address], [place], [place] [address]     Labs: BNP (last 3 results) No results for input(s): BNP in the last 8760 hours. Basic Metabolic Panel: Recent Labs  Lab 07/17/19 1027 07/18/19 0430  NA 128* 131*  K 4.4 3.9  CL 92* 98  CO2 23 24  GLUCOSE 370* 145*  BUN 14 11  CREATININE 1.59* 0.84  CALCIUM 8.9 8.1*  MG  --  2.0   Liver Function Tests: Recent Labs  Lab 07/17/19  1027 07/18/19 0430  AST 136* 62*  ALT 127* 85*  ALKPHOS 98 76  BILITOT 2.6* 1.8*  PROT 6.5 6.0*  ALBUMIN 3.6 3.1*   Recent Labs  Lab 07/17/19 1027  LIPASE 19   No results for input(s): AMMONIA in the last 168 hours. CBC: Recent Labs  Lab 07/17/19 1027 07/18/19 0430  WBC 11.3* 10.1  HGB 14.8 13.5  HCT 46.1 42.7  MCV 76.1* 77.2*  PLT 264 244   Cardiac Enzymes: No results for input(s): CKTOTAL, CKMB, CKMBINDEX, TROPONINI in the last 168 hours. BNP: Invalid input(s): POCBNP CBG: Recent Labs  Lab 07/17/19 1709 07/17/19 2117 07/17/19 2344 07/18/19 0408 07/18/19 0806  GLUCAP 266* 215* 156* 178* 106*   D-Dimer No results for input(s): DDIMER in the last 72 hours. Hgb A1c Recent Labs    07/17/19 1417  HGBA1C 9.7*   Lipid Profile No results for input(s): CHOL, HDL, LDLCALC, TRIG, CHOLHDL, LDLDIRECT in the last 72 hours. Thyroid function studies No results for input(s): TSH, T4TOTAL, T3FREE, THYROIDAB in the last 72 hours.  Invalid input(s):  FREET3 Anemia work up No results for input(s): VITAMINB12, FOLATE, FERRITIN, TIBC, IRON, RETICCTPCT in the last 72 hours. Urinalysis    Component Value Date/Time   COLORURINE AMBER (A) 07/17/2019 1001   APPEARANCEUR HAZY (A) 07/17/2019 1001   LABSPEC 1.010 07/17/2019 1001   PHURINE 6.0 07/17/2019 1001   GLUCOSEU >=500 (A) 07/17/2019 1001   HGBUR SMALL (A) 07/17/2019 1001   BILIRUBINUR NEGATIVE 07/17/2019 1001   BILIRUBINUR negative 03/18/2016 1359   KETONESUR NEGATIVE 07/17/2019 1001   PROTEINUR 100 (A) 07/17/2019 1001   UROBILINOGEN 1.0 03/18/2016 1359   NITRITE NEGATIVE 07/17/2019 1001   LEUKOCYTESUR NEGATIVE 07/17/2019 1001   Sepsis Labs Invalid input(s): PROCALCITONIN,  WBC,  LACTICIDVEN Microbiology Recent Results (from the past 240 hour(s))  SARS CORONAVIRUS 2 (TAT 6-24 HRS) Nasopharyngeal Nasopharyngeal Swab     Status: None   Collection Time: 07/17/19 10:21 AM   Specimen: Nasopharyngeal Swab  Result Value Ref Range Status   SARS Coronavirus 2 NEGATIVE NEGATIVE Final    Comment: (NOTE) SARS-CoV-2 target nucleic acids are NOT DETECTED. The SARS-CoV-2 RNA is generally detectable in upper and lower respiratory specimens during the acute phase of infection. Negative results do not preclude SARS-CoV-2 infection, do not rule out co-infections with other pathogens, and should not be used as the sole basis for treatment or other patient management decisions. Negative results must be combined with clinical observations, patient history, and epidemiological information. The expected result is Negative. Fact Sheet for Patients: SugarRoll.be Fact Sheet for Healthcare Providers: https://www.woods-mathews.com/ This test is not yet approved or cleared by the [place] FDA and  has been authorized for detection and/or diagnosis of SARS-CoV-2 by FDA under an Emergency Use Authorization (EUA). This EUA will remain  in effect (meaning this  test can be used) for the duration of the COVID-19 declaration under Section 56 4(b)(1) of the Act, 21 U.S.C. section 360bbb-3(b)(1), unless the authorization is terminated or revoked sooner. Performed at [facility], [place] [address] [place], [place] [address]   C difficile quick scan w PCR reflex     Status: None   Collection Time: 07/17/19  1:06 PM   Specimen: Stool  Result Value Ref Range Status   C Diff antigen NEGATIVE NEGATIVE Final   C Diff toxin NEGATIVE NEGATIVE Final   C Diff interpretation No C. difficile detected.  Final    Comment: Performed at [facility],  [address] [place], [place] [address]     Time coordinating discharge: 35 minutes  SIGNED:   Rodena Goldmann, DO [facility] 07/18/2019, 10:30 AM  If 7PM-7AM, please contact night-coverage [email or web address]

## 2019-07-18 NOTE — Progress Notes (Signed)
IV removed, medications returned from pharmacy. D/C instructions reviewed with patient, verbalized understanding. Transported to private vehicle via wheelchair.

## 2019-07-18 NOTE — Progress Notes (Signed)
Subjective: Feeling completely better today. No more N/V or diarrhea since hospitalization. Feels like himself. No more abdominal pain. Tolerated full liquids this morning. Was told he'll be d/c if he tolerates lunch. No further GI complaints.  Objective: Vital signs in last 24 hours: Temp:  [97.6 F (36.4 C)-97.8 F (36.6 C)] 97.6 F (36.4 C) (12/03 1540) Pulse Rate:  [70-109] 94 (12/04 0416) Resp:  [12-18] 18 (12/04 0413) BP: (101-134)/(75-101) 130/91 (12/04 0416) SpO2:  [91 %-100 %] 99 % (12/04 0416) Weight:  [126 kg-128.4 kg] 128.4 kg (12/03 1540) Last BM Date: 07/17/19 General:   Alert and oriented, pleasant Head:  Normocephalic and atraumatic. Eyes:  No icterus, sclera clear. Conjuctiva pink.  Heart:  S1, S2 present, no murmurs noted.  Lungs: Clear to auscultation bilaterally, without wheezing, rales, or rhonchi.  Abdomen:  Bowel sounds present, soft, non-tender, non-distended. No HSM or hernias noted. No rebound or guarding. No masses appreciated  Msk:  Symmetrical without gross deformities. Extremities:  Without clubbing or edema. Neurologic:  Alert and  oriented x4;  grossly normal neurologically. Skin:  Warm and dry, intact without significant lesions.  Psych:  Alert and cooperative. Normal mood and affect.  Intake/Output from previous day: 12/03 0701 - 12/04 0700 In: 35 [P.O.:240; I.V.:1] Out: -  Intake/Output this shift: No intake/output data recorded.  Lab Results: Recent Labs    07/17/19 1027 07/18/19 0430  WBC 11.3* 10.1  HGB 14.8 13.5  HCT 46.1 42.7  PLT 264 244   BMET Recent Labs    07/17/19 1027 07/18/19 0430  NA 128* 131*  K 4.4 3.9  CL 92* 98  CO2 23 24  GLUCOSE 370* 145*  BUN 14 11  CREATININE 1.59* 0.84  CALCIUM 8.9 8.1*   LFT Recent Labs    07/17/19 1027 07/18/19 0430  PROT 6.5 6.0*  ALBUMIN 3.6 3.1*  AST 136* 62*  ALT 127* 85*  ALKPHOS 98 76  BILITOT 2.6* 1.8*   PT/INR No results for input(s): LABPROT, INR in the  last 72 hours. Hepatitis Panel No results for input(s): HEPBSAG, HCVAB, HEPAIGM, HEPBIGM in the last 72 hours.   Studies/Results: Ct Head Wo Contrast  Result Date: 07/17/2019 CLINICAL DATA:  Focal neuro deficit, less than 6 hours, stroke suspected. Additional history provided: Nausea and vomiting with diarrhea for 2 weeks with dizziness. EXAM: CT HEAD WITHOUT CONTRAST TECHNIQUE: Contiguous axial images were obtained from the base of the skull through the vertex without intravenous contrast. COMPARISON:  MRI/MRA head 12/16/2008, head CT 12/15/2008 FINDINGS: Brain: No evidence of acute intracranial hemorrhage. No demarcated cortical infarction. No evidence of intracranial mass. No midline shift or extra-axial fluid collection. Vascular: No hyperdense vessel.  Atherosclerotic calcifications. Skull: Normal. Negative for fracture or focal lesion. Sinuses/Orbits: Visualized orbits demonstrate no acute abnormality. Other: Minimal partial opacification of right ethmoid air cells. No significant mastoid effusion. IMPRESSION: No CT evidence of acute intracranial abnormality. Electronically Signed   By: Kellie Simmering DO   On: 07/17/2019 11:44   US Abdomen Complete  Result Date: 07/17/2019 CLINICAL DATA:  Abdominal pain for 3 months. EXAM: ABDOMEN ULTRASOUND COMPLETE COMPARISON:  CT abdomen and pelvis 07/09/2019 and 03/31/2017. FINDINGS: Gallbladder: No gallstones or wall thickening visualized. No sonographic Murphy sign noted by sonographer. Common bile duct: Diameter: 0.3 cm Liver: Hemangioma in the right hepatic lobe is seen as on the prior examinations. Echogenicity is mildly increased consistent with fatty infiltration. Portal vein is patent on color Doppler imaging with normal direction  of blood flow towards the liver. IVC: No abnormality visualized. Pancreas: Visualized portion unremarkable. Spleen: Size and appearance within normal limits. Right Kidney: Length: 13.1 cm. Echogenicity within normal limits. No  mass or hydronephrosis visualized. Left Kidney: Length: 13.7 cm. Echogenicity within normal limits. No mass or hydronephrosis visualized. Abdominal aorta: No aneurysm visualized. Other findings: None. IMPRESSION: No acute abnormality or finding to explain the patient's symptoms. Negative for gallstones. Fatty infiltration of the liver. Electronically Signed   By: Inge Rise M.D.   On: 07/17/2019 13:50   Dg Chest Portable 1 View  Result Date: 07/17/2019 CLINICAL DATA:  Nausea, vomiting, diarrhea and weakness over the past 2 weeks. EXAM: PORTABLE CHEST 1 VIEW COMPARISON:  PA and lateral chest 01/06/2018. FINDINGS: The lungs are clear. Heart size is normal. No pneumothorax or pleural fluid. No acute or focal bony abnormality. IMPRESSION: Negative chest. Electronically Signed   By: Inge Rise M.D.   On: 07/17/2019 12:34    Assessment: 48 year old male who was referred to the emergency department from our office yesterday.  He came to his appointment for history of right upper quadrant abdominal pain and nausea. BP soft, diaphoretic, persistent N/V, diarrhea for 1.2 months.Poor DM control (CBGs in the 300s). Known hemangioma of the liver.  Previous ED evaluation 07/08/2019 for nausea, vomiting, diarrhea, cramping of the upper abdomen for 2 weeks.  Labs were essentially normal other than slightly low sodium at 134.  His WBC count was normal.  CT without any acute explanation for his symptoms.  Gallbladder unremarkable.  In the ER he noted worsening unsteadiness and dizziness.  Recent hemoglobin A1c greater than 7.  His labs in the ER included blood glucose of 370, creatinine elevated at 1.59, AST/ALT elevated at 136/127, total bilirubin 2.6.  CT of the head was unremarkable.  Chest x-ray unremarkable.  He was hydrated and presented for possible admission.  He was feeling much better after fluids.  He was admitted for further evaluation and management of acute kidney injury, presyncope likely due to  orthostasis/dehydration, persistent nausea and vomiting likely related to diabetic gastroparesis, persistent diarrhea.  C. difficile was checked and found to be negative.  GI pathogen panel has been collected and is in process.  HIV negative.  SARS-CoV-2 negative.  Labs today with improvement in sodium now 131 compared to 128 yesterday.  Blood glucose much better at 145.  AST/ALT decreased to 62/85, total bilirubin decreased to 1.8, alkaline phosphatase remains normal.  CBC with improved leukocytosis (initially likely due to hemoconcentration) now at 10.1.  Hemoglobin slightly declined likely due to hydration affect today at 13.5.  Platelets normal.  Abdominal ultrasound completed yesterday found no acute abnormality to explain symptoms, no gallstones, fatty infiltration of the liver.  Today he is significantly improved. Blood suger better. No further N/V, diarrhea, abdominal pain. Discussed need to keep DM under control. Hospitalist planning endocrine referral. Tolerating full liquids, diet advanced to heart healthy for lunch. Plan to d/c after lunch if he tolerates.  Plan: 1. Continue Reglan at home 2. Agree with endocrine referral 3. Follow LFTs as outpatient 4. Drink adequate fluids 5. Outpatient follow-up with out office 6. Consider outpatient GES 7. Call our office with any worsening problems (sooner rather than later)   Thank you for allowing [place] to participate in the care of [place], DNP, AGNP-C Adult & Gerontological Nurse Practitioner [facility]     LOS: 1 day    07/18/2019, 8:26 AM

## 2019-07-18 NOTE — Telephone Encounter (Signed)
Please schedule 4-6 week hospital follow-up.

## 2019-07-21 ENCOUNTER — Encounter: Payer: Self-pay | Admitting: Internal Medicine

## 2019-07-21 LAB — GI PATHOGEN PANEL BY PCR, STOOL

## 2019-07-21 NOTE — Telephone Encounter (Signed)
PATIENT SCHEDULED AND LETTER SENT  °

## 2019-07-23 ENCOUNTER — Telehealth: Payer: Self-pay | Admitting: Internal Medicine

## 2019-07-23 NOTE — Telephone Encounter (Signed)
[phone]   Patient called and [name] Gregory was going to send him in a prescription of reglan.  Needs to be sent  to [facility] in [place].  She saw him in the hospital

## 2019-07-23 NOTE — Telephone Encounter (Signed)
Left a detailed message for pt. Pt can call back if needed.

## 2019-07-23 NOTE — Telephone Encounter (Signed)
Spoke with pt. Pt is aware that his medication was sent in at the ED when he was seen by a provider. pt would like to know if [facility] can send it into [facility] in [place]?

## 2019-07-23 NOTE — Telephone Encounter (Signed)
Randall Hiss saw patient in hospital. I did see him in office and sent him to ED for evaluation. Reviewed hospital discharge summary. Recommendations to continue Reglan 5 mg TID. Looks like a prescription for Reglan was sent to [facility] in [place].

## 2019-07-23 NOTE — Telephone Encounter (Signed)
It would be best for the new pharmacy to call [facility] and request the prescription transferred.

## 2019-07-23 NOTE — Telephone Encounter (Signed)
Routing to [facility] 

## 2019-07-24 ENCOUNTER — Telehealth: Payer: Self-pay | Admitting: Internal Medicine

## 2019-07-24 NOTE — Telephone Encounter (Signed)
Spoke with [employer]. Pt picked his RX up on 07/19/2019. Spoke with pt. Pt wasn't aware of the generic name and is aware now, that he is currently taking the medication Reglan.

## 2019-07-24 NOTE — Telephone Encounter (Signed)
Pt called to say his prescription of Reglan should be sent to [facility]. I seen where Dr Manuella Ghazi had sent a Reglan Rx in on 12/4 to [facility]. I told patient that, but he said it was never called in and we needed to send Rx to [facility]. I told him I would have to send a message to the nurse and he hung up. [phone]

## 2019-07-31 DIAGNOSIS — K3184 Gastroparesis: Secondary | ICD-10-CM | POA: Diagnosis not present

## 2019-07-31 DIAGNOSIS — E611 Iron deficiency: Secondary | ICD-10-CM | POA: Diagnosis not present

## 2019-07-31 DIAGNOSIS — E119 Type 2 diabetes mellitus without complications: Secondary | ICD-10-CM | POA: Diagnosis not present

## 2019-07-31 DIAGNOSIS — E559 Vitamin D deficiency, unspecified: Secondary | ICD-10-CM | POA: Diagnosis not present

## 2019-08-05 ENCOUNTER — Ambulatory Visit: Payer: 59 | Admitting: "Endocrinology

## 2019-08-20 NOTE — Progress Notes (Deleted)
Referring Provider: Vidal Schwalbe, MD Primary Care Physician:  Vidal Schwalbe, MD Primary GI Physician: Dr. Gala Romney  No chief complaint on file.   HPI:   [name] Gregory is a 49 y.o. male presenting today with a GI history significant for GERD, chronic nausea, chronic intermittent RUQ pain with extensive workup in the past, and alternating constipation and diarrhea. Known hemangioma of the liver.  EGD in September 2017 with gastritis, reflux esophagitis. Colonoscopy with diverticulosis, due for repeat in 2027. HIDA normal in 2018.  CTs have been unrevealing.  Last seen in our office on 07/17/2019 for follow-up of ED visit on 07/08/2019 for nausea, vomiting, diarrhea, and cramping upper abdominal pain.  Evaluation in the ED remarkable for glucose 310 and sodium 134.  Labs otherwise normal.  CT unrevealing.  At his office visit, he continued with N/V/D.  Postprandial watery diarrhea for 1-1.5 months.  Nausea and vomiting with worsening diarrhea for the last 2-3 weeks.  Vomiting 6-7 times a day.  6-7 BMs daily.  Intermittent cutting feeling in various places in his abdomen, often before having a BM.  Toilet tissue hematochezia and rectal burning when wiping.  Lomotil and Zofran ineffective.  30-40 pound weight loss in the last few weeks.  Lightheadedness and dizziness, unable to keep diabetes medications down.  Differentials for diarrhea included infectious diarrhea, underlying diabetic enteropathy or IBS with possible postinfectious IBS.  Differentials for nausea/vomiting included postinfectious gastroparesis versus exacerbation of underlying gastroparesis in the setting of diabetes.  COVID-19 also on the differential.  Patient was advised to go to the emergency department due to soft blood pressure, lightheadedness, dizziness, and adequate oral intake, and inability to keep diabetes medications down.  Patient was admitted from 12/3-12/11/2018 with symptoms of presyncope/orthostasis due to dehydration and  poor oral intake from suspected diabetic gastroparesis as well as diarrhea.  Found to have acute kidney injury.  Glucose elevated at 370.  Sodium low at 128.  LFTs were also elevated on admission with ALT 127, AST 136.  Total bilirubin up to 2.6. Abdominal ultrasound without acute findings, the liver noted.  He was hydrated, started on Reglan, Zofran as needed, sliding scale insulin, and clear liquid diet.  He had improvement in his symptoms.  Diet was advanced to heart healthy and he tolerated this well.  Had resolution of nausea/vomiting/diarrhea. LFTs trended down with ALT 85 and AST 62.  C. difficile and GI pathogen panel was found to be negative. Covid negative.  At discharge, patient was referred to endocrinology for better management of diabetes, advised to continue Reglan 5 mg 3 times daily, eat smaller more frequent meals, Zofran as needed, Imodium as needed, and follow-up with GI and PCP.   Today:   Nausea/Vomiting:   Diarrhea:   GERD:   Elevated LFTs:    Past Medical History:  Diagnosis Date  . Complication of anesthesia    WOKE UP DURING SURGERY  . Diabetes ([place])   . Hemangioma of liver   . Hypertension   . Sleep apnea   . URI (upper respiratory infection)     Past Surgical History:  Procedure Laterality Date  . BIOPSY  05/04/2016   Procedure: BIOPSY;  Surgeon: Daneil Dolin, MD;  Location: [facility];  Service: Endoscopy;;  gastric bx  . COLONOSCOPY WITH PROPOFOL N/A 05/04/2016    PROPOFOL;  Surgeon: Daneil Dolin, MD; diverticulosis throughout the entire colon, otherwise normal.  Repeat due in 2027.  Marland Kitchen ESOPHAGOGASTRODUODENOSCOPY (EGD) WITH PROPOFOL N/A  05/04/2016   PROPOFOL;  Surgeon: Daneil Dolin, MD; LA grade B esophagitis, gastritis, small hiatal hernia.  Marland Kitchen EYE SURGERY    . SALIVARY GLAND SURGERY  1992    Current Outpatient Medications  Medication Sig Dispense Refill  . amLODipine (NORVASC) 10 MG tablet Take 10 mg by mouth daily.    Marland Kitchen buPROPion (ZYBAN) 150  MG 12 hr tablet Take 150 mg by mouth 2 (two) times daily.    . carvedilol (COREG) 25 MG tablet Take 25 mg by mouth 2 (two) times daily with a meal.    . citalopram (CELEXA) 20 MG tablet Take 1 tablet by mouth daily.    . diphenoxylate-atropine (LOMOTIL) 2.5-0.025 MG tablet Take 1-2 tablets by mouth 4 (four) times daily as needed for diarrhea or loose stools. 30 tablet 0  . gabapentin (NEURONTIN) 300 MG capsule Take 300 mg by mouth 2 (two) times daily. Take one capsule by mouth in the morning and two at bedtime for nerve pain     . glimepiride (AMARYL) 2 MG tablet Take 1 tablet (2 mg total) by mouth daily before breakfast. 30 tablet 0  . irbesartan-hydrochlorothiazide (AVALIDE) 300-12.5 MG tablet Take 1 tablet by mouth daily.    Marland Kitchen loperamide (IMODIUM A-D) 2 MG tablet Take 1 tablet (2 mg total) by mouth 4 (four) times daily as needed for diarrhea or loose stools. 30 tablet 0  . LORazepam (ATIVAN) 0.5 MG tablet Take 0.5 mg by mouth every 8 (eight) hours as needed for anxiety.     . metFORMIN (GLUCOPHAGE-XR) 500 MG 24 hr tablet Take 1 tablet by mouth daily.    . metoCLOPramide (REGLAN) 5 MG tablet Take 1 tablet (5 mg total) by mouth 3 (three) times daily. 90 tablet 1  . Naproxen-Esomeprazole (VIMOVO) 500-20 MG TBEC Take by mouth daily.    Marland Kitchen omeprazole (PRILOSEC) 20 MG capsule Take 20 mg by mouth daily.    . ondansetron (ZOFRAN) 4 MG tablet Take 1 tablet (4 mg total) by mouth every 6 (six) hours as needed for nausea or vomiting. 30 tablet 1  . ondansetron (ZOFRAN) 4 MG tablet Take 1 tablet (4 mg total) by mouth daily as needed for nausea or vomiting. 30 tablet 1  . Vitamin D, Ergocalciferol, (DRISDOL) 50000 units CAPS capsule Take 1 capsule by mouth 3 (three) times a week.     No current facility-administered medications for this visit.    Allergies as of 08/21/2019  . (No Known Allergies)    Family History  Problem Relation Age of Onset  . Hypertension Mother   . CVA Father   . Diabetes  Father   . Throat cancer Father   . Heart disease Brother 65  . Cervical cancer Maternal Grandmother   . Throat cancer Maternal Grandmother   . Throat cancer Maternal Grandfather   . Bladder Cancer Maternal Grandfather   . Colon cancer Neg Hx     Social History   Socioeconomic History  . Marital status: Married    Spouse name: Not on file  . Number of children: Not on file  . Years of education: Not on file  . Highest education level: Not on file  Occupational History  . Occupation: administrative for [employer]  Tobacco Use  . Smoking status: Current Every Day Smoker    Packs/day: 0.25    Years: 36.00    Pack years: 9.00    Types: Cigarettes  . Smokeless tobacco: Never Used  . Tobacco comment: 4cigs a  day as of 07/19/17 ep  Substance and Sexual Activity  . Alcohol use: No  . Drug use: No  . Sexual activity: Not on file  Other Topics Concern  . Not on file  Social History Narrative  . Not on file   Social Determinants of Health   Financial Resource Strain: Unknown  . Difficulty of Paying Living Expenses: Patient refused  Food Insecurity: Unknown  . Worried About Charity fundraiser in the Last Year: Patient refused  . Ran Out of Food in the Last Year: Patient refused  Transportation Needs: Unknown  . Lack of Transportation (Medical): Patient refused  . Lack of Transportation (Non-Medical): Patient refused  Physical Activity: Unknown  . Days of Exercise per Week: Patient refused  . Minutes of Exercise per Session: Patient refused  Stress:   . Feeling of Stress : Not on file  Social Connections: Unknown  . Frequency of Communication with Friends and Family: Patient refused  . Frequency of Social Gatherings with Friends and Family: Patient refused  . Attends Religious Services: Patient refused  . Active Member of Clubs or Organizations: Patient refused  . Attends Archivist Meetings: Patient refused  . Marital Status: Patient refused    Review of  Systems: Gen: Denies fever, chills, anorexia. Denies fatigue, weakness, weight loss.  CV: Denies chest pain, palpitations, syncope, peripheral edema, and claudication. Resp: Denies dyspnea at rest, cough, wheezing, coughing up blood, and pleurisy. GI: Denies vomiting blood, jaundice, and fecal incontinence.   Denies dysphagia or odynophagia. Derm: Denies rash, itching, dry skin Psych: Denies depression, anxiety, memory loss, confusion. No homicidal or suicidal ideation.  Heme: Denies bruising, bleeding, and enlarged lymph nodes.  Physical Exam: There were no vitals taken for this visit. General:   Alert and oriented. No distress noted. Pleasant and cooperative.  Head:  Normocephalic and atraumatic. Eyes:  Conjuctiva clear without scleral icterus. Mouth:  Oral mucosa pink and moist. Good dentition. No lesions. Heart:  S1, S2 present without murmurs appreciated. Lungs:  Clear to auscultation bilaterally. No wheezes, rales, or rhonchi. No distress.  Abdomen:  +BS, soft, non-tender and non-distended. No rebound or guarding. No HSM or masses noted. Msk:  Symmetrical without gross deformities. Normal posture. Extremities:  Without edema. Neurologic:  Alert and  oriented x4 Psych:  Alert and cooperative. Normal mood and affect.

## 2019-08-21 ENCOUNTER — Ambulatory Visit: Payer: BC Managed Care – PPO | Admitting: Gastroenterology

## 2019-08-28 ENCOUNTER — Ambulatory Visit: Payer: Self-pay | Admitting: "Endocrinology

## 2019-08-28 ENCOUNTER — Encounter: Payer: Self-pay | Admitting: "Endocrinology

## 2019-09-03 NOTE — Progress Notes (Signed)
Patient was a no-show. No charge.

## 2019-09-04 ENCOUNTER — Encounter: Payer: Self-pay | Admitting: Internal Medicine

## 2019-09-04 ENCOUNTER — Ambulatory Visit (INDEPENDENT_AMBULATORY_CARE_PROVIDER_SITE_OTHER): Payer: BC Managed Care – PPO | Admitting: Gastroenterology

## 2019-09-04 ENCOUNTER — Telehealth: Payer: Self-pay

## 2019-09-04 ENCOUNTER — Other Ambulatory Visit: Payer: Self-pay

## 2019-09-04 ENCOUNTER — Telehealth: Payer: Self-pay | Admitting: Internal Medicine

## 2019-09-04 DIAGNOSIS — Z5329 Procedure and treatment not carried out because of patient's decision for other reasons: Secondary | ICD-10-CM

## 2019-09-04 NOTE — Telephone Encounter (Signed)
Noted  

## 2019-09-04 NOTE — Telephone Encounter (Signed)
Patient did not answer for doxy visit, no show and letter sent

## 2019-09-04 NOTE — Telephone Encounter (Signed)
Tried to call pt at 9:35am to start Doxy visit, no answer, LMOVM for return call. Tried to call pt again at 9:58am, no answer, unable to leave message dt/ voicemail box full. No return call as of 10:15am. Pt is no show.

## 2019-09-04 NOTE — Telephone Encounter (Signed)
Patient sent no show letter

## 2019-10-20 DIAGNOSIS — Z20828 Contact with and (suspected) exposure to other viral communicable diseases: Secondary | ICD-10-CM | POA: Diagnosis not present

## 2019-10-26 DIAGNOSIS — Z20828 Contact with and (suspected) exposure to other viral communicable diseases: Secondary | ICD-10-CM | POA: Diagnosis not present

## 2019-10-26 DIAGNOSIS — J310 Chronic rhinitis: Secondary | ICD-10-CM | POA: Diagnosis not present

## 2020-03-15 DIAGNOSIS — I10 Essential (primary) hypertension: Secondary | ICD-10-CM | POA: Diagnosis not present

## 2020-03-15 DIAGNOSIS — Z720 Tobacco use: Secondary | ICD-10-CM | POA: Diagnosis not present

## 2020-03-15 DIAGNOSIS — E611 Iron deficiency: Secondary | ICD-10-CM | POA: Diagnosis not present

## 2020-03-15 DIAGNOSIS — K3184 Gastroparesis: Secondary | ICD-10-CM | POA: Diagnosis not present

## 2020-03-15 DIAGNOSIS — E119 Type 2 diabetes mellitus without complications: Secondary | ICD-10-CM | POA: Diagnosis not present

## 2020-03-15 DIAGNOSIS — E559 Vitamin D deficiency, unspecified: Secondary | ICD-10-CM | POA: Diagnosis not present

## 2020-03-31 DIAGNOSIS — Z20828 Contact with and (suspected) exposure to other viral communicable diseases: Secondary | ICD-10-CM | POA: Diagnosis not present

## 2020-05-30 ENCOUNTER — Encounter: Payer: Self-pay | Admitting: Gastroenterology

## 2020-05-30 NOTE — Progress Notes (Deleted)
Referring Provider: Vidal Schwalbe, MD Primary Care Physician:  Vidal Schwalbe, MD Primary GI Physician: Dr. Gala Romney  No chief complaint on file.   HPI:   [name] Gregory is a 49 y.o. male with chronic history of RUQ abdominal pain, nausea, alternating constipation/diarrhea and abdominal cramping.  Prior evaluation with CT and MRI revealing liver hemangiomas.  EGD in September 2017 with LA grade B esophagitis, gastritis, small hiatal hernia.  Colonoscopy in 2017 with diverticulosis, due for repeat in 2027.  HIDA normal in 2018.  More recent imaging with CT in November 2020 with no explanation of N/V or abdominal pain.  He presents today for follow-up of nausea, vomiting, abdominal pain, diarrhea.  Last seen in our office in December 2020.  Reported for the last 2-3 weeks, he was struggling with nausea, vomiting, and worsening diarrhea.  Was unable to keep much down.  He was having lightheadedness and dizziness.  Also with intermittent "cutting feeling" at various places in his abdomen.  This was often before a BM.  Was experiencing some toilet tissue hematochezia and rectal burning since diarrhea started.  Was having 6-7 BMs daily.  Reported 30-40 pound weight loss in the last few weeks (documented 4 pound weight loss in 1 week).  He was unable to keep diabetes medications down.  Differentials for diarrhea included infectious diarrhea, postinfectious IBS, diabetic enteropathy, doubt IBD.  Suspected N/V may also be secondary to acute viral gastroenteritis with postinfectious gastroparesis versus exacerbation of underlying gastroparesis in setting of diabetes.  Due to minimal intake, soft BP and lightheadedness/dizziness, patient was sent to the emergency room.  Admitted to the hospital 07/17/2019-07/18/2019.  Labs in the ER with blood glucose 370, creatinine 1.59, AST/ALT elevated at 136/137, total bilirubin 2.6.  C. difficile was negative.  GI pathogen panel negative. SARS-CoV-2 negative. Abdominal  ultrasound with no acute abnormality.  He was given IV fluids and started on Reglan 5 mg TID with clinical improvement. AST/ALT decreased to 62/85, total bilirubin decreased to 1.8, alkaline phosphatase remains normal. He was discharged on Reglan and advised to follow-up with GI.  He no-show to his follow-up appointment 09/04/2019.  Today:   N/V:  Diarrhea:   Elevated LFTs:    Past Medical History:  Diagnosis Date  . Complication of anesthesia    WOKE UP DURING SURGERY  . Diabetes ([place])   . Hemangioma of liver   . Hypertension   . Sleep apnea   . URI (upper respiratory infection)     Past Surgical History:  Procedure Laterality Date  . BIOPSY  05/04/2016   Procedure: BIOPSY;  Surgeon: Daneil Dolin, MD;  [facility]: [facility];  Service: Endoscopy;;  gastric bx  . COLONOSCOPY WITH PROPOFOL N/A 05/04/2016    PROPOFOL;  Surgeon: Daneil Dolin, MD; diverticulosis throughout the entire colon, otherwise normal.  Repeat due in 2027.  Marland Kitchen ESOPHAGOGASTRODUODENOSCOPY (EGD) WITH PROPOFOL N/A 05/04/2016   PROPOFOL;  Surgeon: Daneil Dolin, MD; LA grade B esophagitis, gastritis, small hiatal hernia.  Marland Kitchen EYE SURGERY    . SALIVARY GLAND SURGERY  1992    Current Outpatient Medications  Medication Sig Dispense Refill  . amLODipine (NORVASC) 10 MG tablet Take 10 mg by mouth daily.    Marland Kitchen buPROPion (ZYBAN) 150 MG 12 hr tablet Take 150 mg by mouth 2 (two) times daily.    . carvedilol (COREG) 25 MG tablet Take 25 mg by mouth 2 (two) times daily with a meal.    . citalopram (  CELEXA) 20 MG tablet Take 1 tablet by mouth daily.    . diphenoxylate-atropine (LOMOTIL) 2.5-0.025 MG tablet Take 1-2 tablets by mouth 4 (four) times daily as needed for diarrhea or loose stools. 30 tablet 0  . gabapentin (NEURONTIN) 300 MG capsule Take 300 mg by mouth 2 (two) times daily. Take one capsule by mouth in the morning and two at bedtime for nerve pain     . glimepiride (AMARYL) 2 MG tablet Take 1 tablet (2 mg total)  by mouth daily before breakfast. 30 tablet 0  . irbesartan-hydrochlorothiazide (AVALIDE) 300-12.5 MG tablet Take 1 tablet by mouth daily.    Marland Kitchen loperamide (IMODIUM A-D) 2 MG tablet Take 1 tablet (2 mg total) by mouth 4 (four) times daily as needed for diarrhea or loose stools. 30 tablet 0  . LORazepam (ATIVAN) 0.5 MG tablet Take 0.5 mg by mouth every 8 (eight) hours as needed for anxiety.     . metFORMIN (GLUCOPHAGE-XR) 500 MG 24 hr tablet Take 1 tablet by mouth daily.    . metoCLOPramide (REGLAN) 5 MG tablet Take 1 tablet (5 mg total) by mouth 3 (three) times daily. 90 tablet 1  . Naproxen-Esomeprazole (VIMOVO) 500-20 MG TBEC Take by mouth daily.    Marland Kitchen omeprazole (PRILOSEC) 20 MG capsule Take 20 mg by mouth daily.    . ondansetron (ZOFRAN) 4 MG tablet Take 1 tablet (4 mg total) by mouth every 6 (six) hours as needed for nausea or vomiting. 30 tablet 1  . ondansetron (ZOFRAN) 4 MG tablet Take 1 tablet (4 mg total) by mouth daily as needed for nausea or vomiting. 30 tablet 1  . Vitamin D, Ergocalciferol, (DRISDOL) 50000 units CAPS capsule Take 1 capsule by mouth 3 (three) times a week.     No current facility-administered medications for this visit.    Allergies as of 05/31/2020  . (No Known Allergies)    Family History  Problem Relation Age of Onset  . Hypertension Mother   . CVA Father   . Diabetes Father   . Throat cancer Father   . Heart disease Brother 38  . Cervical cancer Maternal Grandmother   . Throat cancer Maternal Grandmother   . Throat cancer Maternal Grandfather   . Bladder Cancer Maternal Grandfather   . Colon cancer Neg Hx     Social History   Socioeconomic History  . Marital status: Married    Spouse name: Not on file  . Number of children: Not on file  . Years of education: Not on file  . Highest education level: Not on file  Occupational History  . Occupation: administrative for [employer]  Tobacco Use  . Smoking status: Current Every Day Smoker     Packs/day: 0.25    Years: 36.00    Pack years: 9.00    Types: Cigarettes  . Smokeless tobacco: Never Used  . Tobacco comment: 4cigs a day as of 07/19/17 ep  Vaping Use  . Vaping Use: Former  Substance and Sexual Activity  . Alcohol use: No  . Drug use: No  . Sexual activity: Not on file  Other Topics Concern  . Not on file  Social History Narrative  . Not on file   Social Determinants of Health   Financial Resource Strain: Unknown  . Difficulty of Paying Living Expenses: Patient refused  Food Insecurity: Unknown  . Worried About Charity fundraiser in the Last Year: Patient refused  . Ran Out of Food in the Last  Year: Patient refused  Transportation Needs: Unknown  . Lack of Transportation (Medical): Patient refused  . Lack of Transportation (Non-Medical): Patient refused  Physical Activity: Unknown  . Days of Exercise per Week: Patient refused  . Minutes of Exercise per Session: Patient refused  Stress:   . Feeling of Stress : Not on file  Social Connections: Unknown  . Frequency of Communication with Friends and Family: Patient refused  . Frequency of Social Gatherings with Friends and Family: Patient refused  . Attends Religious Services: Patient refused  . Active Member of Clubs or Organizations: Patient refused  . Attends Archivist Meetings: Patient refused  . Marital Status: Patient refused    Review of Systems: Gen: Denies fever, chills, anorexia. Denies fatigue, weakness, weight loss.  CV: Denies chest pain, palpitations, syncope, peripheral edema, and claudication. Resp: Denies dyspnea at rest, cough, wheezing, coughing up blood, and pleurisy. GI: Denies vomiting blood, jaundice, and fecal incontinence.   Denies dysphagia or odynophagia. Derm: Denies rash, itching, dry skin Psych: Denies depression, anxiety, memory loss, confusion. No homicidal or suicidal ideation.  Heme: Denies bruising, bleeding, and enlarged lymph nodes.  Physical Exam: There  were no vitals taken for this visit. General:   Alert and oriented. No distress noted. Pleasant and cooperative.  Head:  Normocephalic and atraumatic. Eyes:  Conjuctiva clear without scleral icterus. Mouth:  Oral mucosa pink and moist. Good dentition. No lesions. Heart:  S1, S2 present without murmurs appreciated. Lungs:  Clear to auscultation bilaterally. No wheezes, rales, or rhonchi. No distress.  Abdomen:  +BS, soft, non-tender and non-distended. No rebound or guarding. No HSM or masses noted. Msk:  Symmetrical without gross deformities. Normal posture. Extremities:  Without edema. Neurologic:  Alert and  oriented x4 Psych:  Alert and cooperative. Normal mood and affect.

## 2020-05-31 ENCOUNTER — Ambulatory Visit: Payer: BC Managed Care – PPO | Admitting: Gastroenterology

## 2020-06-05 IMAGING — DX DG CHEST 1V PORT
1 series · 1 of 1 positions shown · non-contrast
Comparison: PA and lateral chest 01/06/2018.

CLINICAL DATA: Nausea, vomiting, diarrhea and weakness over the
past 2 weeks.

EXAM:
PORTABLE CHEST 1 VIEW

[chest ap]
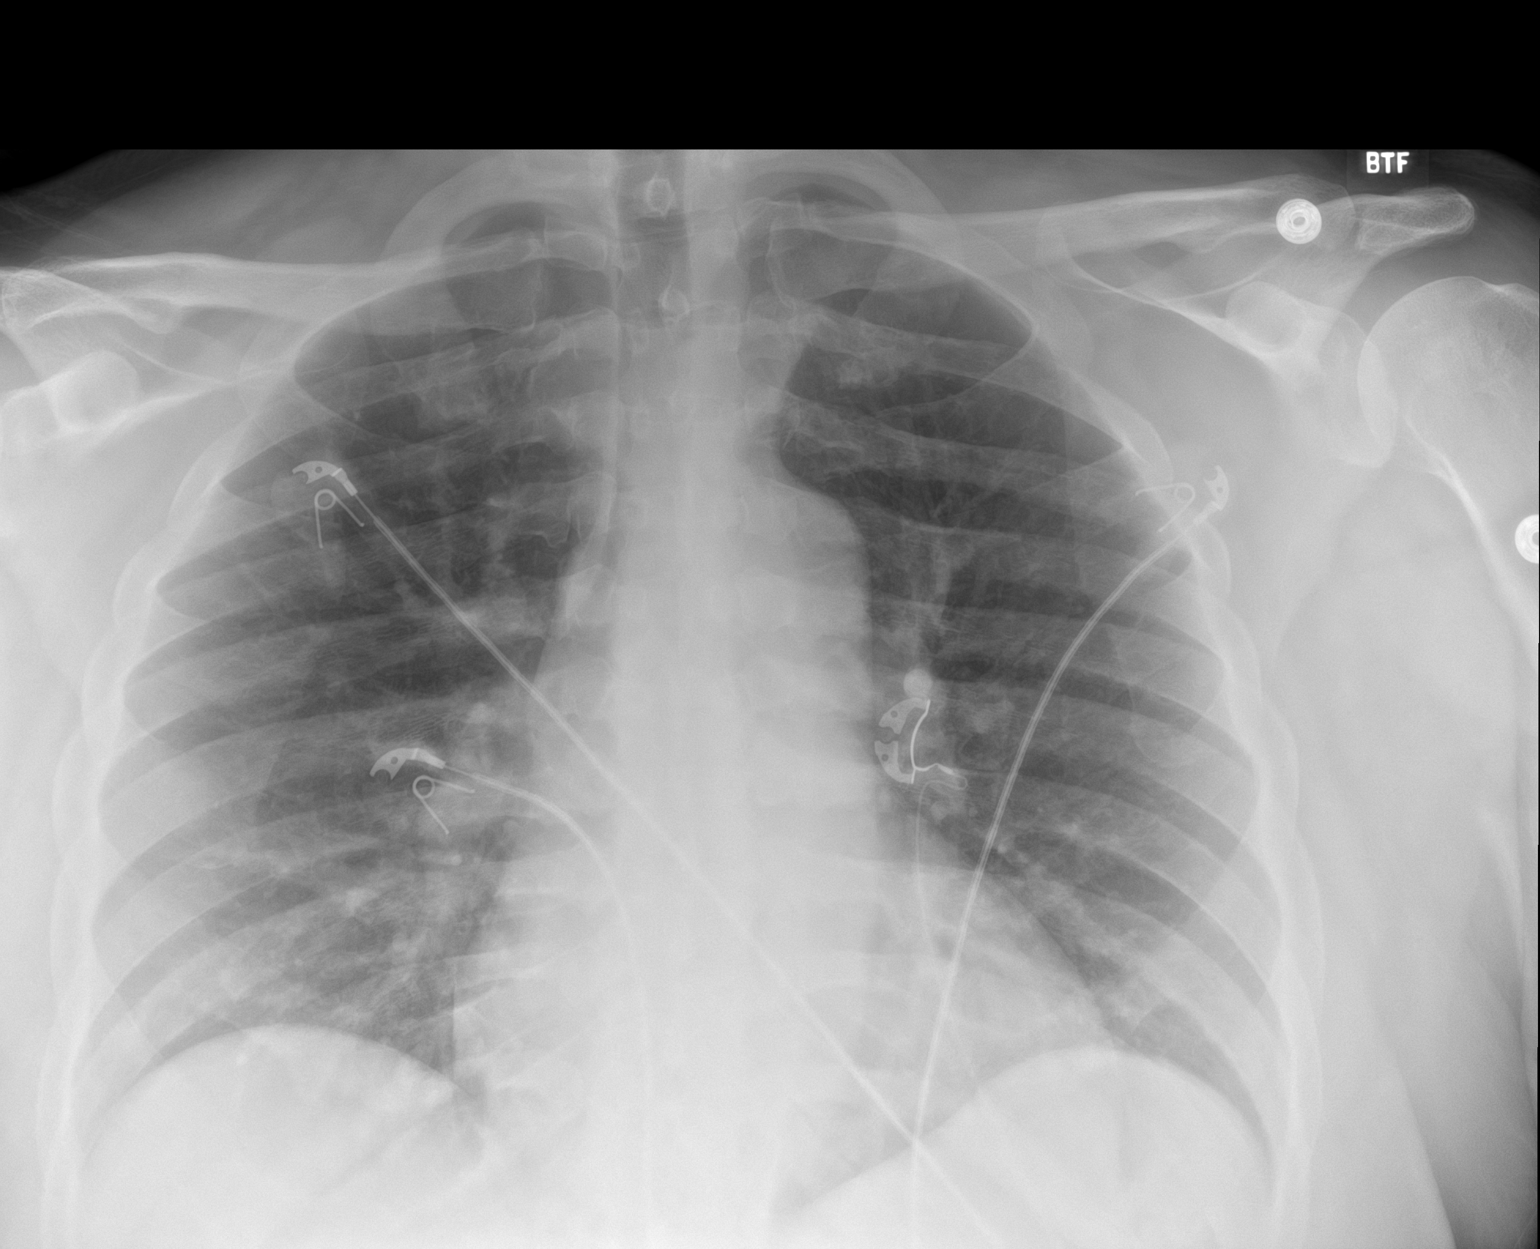

[1 of 1 positions shown; findings below may reference images not displayed]

FINDINGS: The lungs are clear. Heart size is normal. No pneumothorax or
pleural fluid. No acute or focal bony abnormality.
IMPRESSION: Negative chest.

## 2020-06-08 NOTE — Progress Notes (Signed)
Referring Provider: Vidal Schwalbe, MD Primary Care Physician:  Vidal Schwalbe, MD Primary GI Physician: Dr. Gala Romney  Chief Complaint  Patient presents with  . Emesis    none in about few months    HPI:   [name] Gregory is a 49 y.o. male presenting today for follow-up. History of RUQ abdominal pain, nausea, and vomiting. Also with hemangioma in the liver.  Prior evaluation with EGD September 2017 with gastritis, reflux esophagitis.  Colonoscopy in September 2017 with diverticulosis, due for repeat exam 2027.  CTs have been unrevealing.  HIDA within normal limits. Celiac serologies wnl in 2017.   He was last seen in our office 07/17/2019 following emergency room visit on 07/08/2019 for nausea, vomiting, diarrhea, and upper abdominal pain x2 weeks.  ED evaluation with lipase normal, glucose elevated at 310, total bilirubin 1.3 but LFTs normal.  CT unrevealing.  At the time of his office visit, he reported lightheadedness/dizziness.  Postprandial watery diarrhea has been present for about 1-1.5 months.  Nausea/vomiting present for 2-3 weeks with 6-7 episodes of emesis daily.  Intermittent sharp abdominal pain x2 weeks.  He was taking Lomotil and Zofran prescribed by the ED but this was not helping much.  Reported having a low-grade fever a few weeks prior when symptoms first began.  Patient was referred to the emergency room for further evaluation/supportive measures.  Patient was admitted to [facility] 12/3[facility]12/4.  Labs in the ER with blood glucose 370, creatinine 1.59, AST/ALT elevated at 136/127, total bilirubin 2.6.  Abdominal ultrasound with normal-appearing gallbladder, normal CBD.  Liver with known hemangioma and fatty liver.  C. difficile and GI pathogen panel were negative.  He was rehydrated with IV fluids.  Patient reported being on low-dose Reglan although this was not on his medication list.  This was resumed during hospitalization as it was suspected that N/V was likely secondary  to gastroparesis. On 10/4, AKI resolved, AST/ALT decreased to 62/85, total bilirubin decreased to 1.8, alk phos remained normal.  Patient was feeling much better, nausea/vomiting/diarrhea had resolved since hospitalization.  He was tolerating full liquids. He was discharged on Reglan and advised to follow-up with GI. Also recommended follow-up with endocrinology due to A1C over 9%.   Patient has no showed to follow-ups.   Today:   N/V: None in the last several months. Notes when laying down at night, he has reflux coming up into his nose. Started a couple weeks ago. Occurred about 3 times. Doesn't eat within 3 hours of laying down. Breakthrough GERD symptoms at least daily. Taking omeprazole 20 mg at night. Avoiding fried/greasy foods. No soda.   Eats breakfast/brunch, then eats a large meal at dinner around 5 pm. Gets full quickly. Feels his foods will sit in the epigastric area for a while.   Hasn't taken Reglan for about 1-2 months. Was prescribed 3 times daily. PCP prescribed it in August. Had stopped taking it around March after hospital discharge.   No abdominal pain. Occasional sharp pain with eating tomatoes and almonds. May be LUQ or mid abdomen.    Elevated LFTs: Alcohol maybe once a month. This causes loose BMs. No known exposures to hepatitis. No needle sticks. No IV or intranasal drug use. Has 1 tattoo professionally many years ago.    BMs daily. May have a "standard BM" then may be watery.  Usually 2 maybe 3 BMs daily. Occasional cramping prior to a BM. No longer having post prandial diarrhea. Certain items may cause loose  stools. Sweets are the worst. Reports one black stool a couple weeks ago. Thinks he took pepto bismol prior to that. Takes imodium maybe once a week.     A1C around 7. Sugars running 100-120 fasting.   BP is elevated today.  Feels this is secondary to back pain.  He has had a lot of back pain yesterday and today.  Has not discussed this with PCP.  Hasn't taken his  blood pressure medication today.  No blurry vision, headache, chest pain, palpitations, shortness of breath, lightheadedness, dizziness.  Advised to take his medication when he gets home and contact his PCP for further evaluation.  Past Medical History:  Diagnosis Date  . Complication of anesthesia    WOKE UP DURING SURGERY  . Diabetes ([place])   . GERD (gastroesophageal reflux disease)   . Hemangioma of liver   . Hypertension   . Sleep apnea   . URI (upper respiratory infection)     Past Surgical History:  Procedure Laterality Date  . BIOPSY  05/04/2016   Procedure: BIOPSY;  Surgeon: Daneil Dolin, MD;  Location: [facility];  Service: Endoscopy;;  gastric bx  . COLONOSCOPY WITH PROPOFOL N/A 05/04/2016    PROPOFOL;  Surgeon: Daneil Dolin, MD; diverticulosis throughout the entire colon, otherwise normal.  Repeat due in 2027.  Marland Kitchen ESOPHAGOGASTRODUODENOSCOPY (EGD) WITH PROPOFOL N/A 05/04/2016   PROPOFOL;  Surgeon: Daneil Dolin, MD; LA grade B esophagitis, gastritis, small hiatal hernia.  Marland Kitchen EYE SURGERY    . SALIVARY GLAND SURGERY  1992    Current Outpatient Medications  Medication Sig Dispense Refill  . amLODipine (NORVASC) 10 MG tablet Take 10 mg by mouth daily.    Marland Kitchen buPROPion (ZYBAN) 150 MG 12 hr tablet Take 150 mg by mouth 2 (two) times daily.    . carvedilol (COREG) 25 MG tablet Take 25 mg by mouth 2 (two) times daily with a meal.    . citalopram (CELEXA) 20 MG tablet Take 1 tablet by mouth daily.    Marland Kitchen gabapentin (NEURONTIN) 300 MG capsule Take 300 mg by mouth 2 (two) times daily. Take one capsule by mouth in the morning and two at bedtime for nerve pain     . glimepiride (AMARYL) 2 MG tablet Take 1 tablet (2 mg total) by mouth daily before breakfast. 30 tablet 0  . irbesartan-hydrochlorothiazide (AVALIDE) 300-12.5 MG tablet Take 1 tablet by mouth daily.    Marland Kitchen loperamide (IMODIUM A-D) 2 MG tablet Take 1 tablet (2 mg total) by mouth 4 (four) times daily as needed for diarrhea or  loose stools. 30 tablet 0  . LORazepam (ATIVAN) 0.5 MG tablet Take 0.5 mg by mouth every 8 (eight) hours as needed for anxiety.     . metFORMIN (GLUCOPHAGE-XR) 500 MG 24 hr tablet Take 1 tablet by mouth daily.    . Vitamin D, Ergocalciferol, (DRISDOL) 50000 units CAPS capsule Take 1 capsule by mouth 3 (three) times a week.    . pantoprazole (PROTONIX) 40 MG tablet Take 1 tablet (40 mg total) by mouth daily before breakfast. 30 tablet 5   No current facility-administered medications for this visit.    Allergies as of 06/09/2020  . (No Known Allergies)    Family History  Problem Relation Age of Onset  . Hypertension Mother   . CVA Father   . Diabetes Father   . Throat cancer Father   . Heart disease Brother 66  . Cervical cancer Maternal Grandmother   .  Throat cancer Maternal Grandmother   . Throat cancer Maternal Grandfather   . Bladder Cancer Maternal Grandfather   . Cirrhosis Maternal Grandfather   . Colon cancer Neg Hx     Social History   Socioeconomic History  . Marital status: Married    Spouse name: Not on file  . Number of children: Not on file  . Years of education: Not on file  . Highest education level: Not on file  Occupational History  . Occupation: administrative for rest [employer]  Tobacco Use  . Smoking status: Current Every Day Smoker    Packs/day: 0.25    Years: 36.00    Pack years: 9.00    Types: Cigarettes  . Smokeless tobacco: Never Used  . Tobacco comment: 4cigs a day as of 07/19/17 ep  Vaping Use  . Vaping Use: Former  Substance and Sexual Activity  . Alcohol use: Yes    Comment: maybe once a month  . Drug use: No  . Sexual activity: Not on file  Other Topics Concern  . Not on file  Social History Narrative  . Not on file   Social Determinants of Health   Financial Resource Strain: Unknown  . Difficulty of Paying Living Expenses: Patient refused  Food Insecurity: Unknown  . Worried About Charity fundraiser in the Last Year: Patient  refused  . Ran Out of Food in the Last Year: Patient refused  Transportation Needs: Unknown  . Lack of Transportation (Medical): Patient refused  . Lack of Transportation (Non-Medical): Patient refused  Physical Activity: Unknown  . Days of Exercise per Week: Patient refused  . Minutes of Exercise per Session: Patient refused  Stress:   . Feeling of Stress : Not on file  Social Connections: Unknown  . Frequency of Communication with Friends and Family: Patient refused  . Frequency of Social Gatherings with Friends and Family: Patient refused  . Attends Religious Services: Patient refused  . Active Member of Clubs or Organizations: Patient refused  . Attends Archivist Meetings: Patient refused  . Marital Status: Patient refused    Review of Systems: Gen: Denies fever, chills, cold or flulike symptoms. CV: See HPI Resp: Denies cough GI: See HPI Heme: See HPI  Physical Exam: BP (!) 171/106   Pulse (!) 106   Temp (!) 97.3 F (36.3 C)   Ht 6' 2" (1.88 m)   Wt (!) 305 lb 12.8 oz (138.7 kg)   BMI 39.26 kg/m  General:   Alert and oriented. No distress noted. Pleasant and cooperative.  Head:  Normocephalic and atraumatic. Eyes:  Conjuctiva clear without scleral icterus. Heart:  S1, S2 present without murmurs appreciated. Lungs:  Clear to auscultation bilaterally. No wheezes, rales, or rhonchi. No distress.  Abdomen:  +BS, soft, non-tender and non-distended. No rebound or guarding. No HSM or masses noted. Msk:  Symmetrical without gross deformities. Normal posture. Extremities: Trace edema in the lower extremities. Neurologic:  Alert and  oriented x4 Psych: Normal mood and affect.

## 2020-06-09 ENCOUNTER — Ambulatory Visit: Payer: BC Managed Care – PPO | Admitting: Gastroenterology

## 2020-06-09 ENCOUNTER — Other Ambulatory Visit: Payer: Self-pay

## 2020-06-09 ENCOUNTER — Encounter: Payer: Self-pay | Admitting: Gastroenterology

## 2020-06-09 ENCOUNTER — Encounter: Payer: Self-pay | Admitting: Internal Medicine

## 2020-06-09 VITALS — BP 171/106 | HR 106 | Temp 97.3°F | Ht 74.0 in | Wt 305.8 lb

## 2020-06-09 DIAGNOSIS — R195 Other fecal abnormalities: Secondary | ICD-10-CM | POA: Insufficient documentation

## 2020-06-09 DIAGNOSIS — R6881 Early satiety: Secondary | ICD-10-CM

## 2020-06-09 DIAGNOSIS — I10 Essential (primary) hypertension: Secondary | ICD-10-CM

## 2020-06-09 DIAGNOSIS — R112 Nausea with vomiting, unspecified: Secondary | ICD-10-CM | POA: Diagnosis not present

## 2020-06-09 DIAGNOSIS — R7989 Other specified abnormal findings of blood chemistry: Secondary | ICD-10-CM | POA: Diagnosis not present

## 2020-06-09 DIAGNOSIS — K219 Gastro-esophageal reflux disease without esophagitis: Secondary | ICD-10-CM | POA: Diagnosis not present

## 2020-06-09 DIAGNOSIS — Z1159 Encounter for screening for other viral diseases: Secondary | ICD-10-CM | POA: Diagnosis not present

## 2020-06-09 DIAGNOSIS — K760 Fatty (change of) liver, not elsewhere classified: Secondary | ICD-10-CM

## 2020-06-09 MED ORDER — PANTOPRAZOLE SODIUM 40 MG PO TBEC: 40.0000 mg | DELAYED_RELEASE_TABLET | Freq: Every day | ORAL | 5 refills | Status: DC

## 2020-06-09 NOTE — Patient Instructions (Signed)
Please have labs completed at [place].  Please have gastric emptying study completed at [facility].  Stop omeprazole and start Protonix 40 mg daily.  Follow a GERD diet:  Avoid fried, fatty, greasy, spicy, citrus foods. Avoid caffeine and carbonated beverages. Avoid chocolate. Try eating 4-6 small meals a day rather than 3 large meals. Do not eat within 3 hours of laying down. Prop head of bed up on wood or bricks to create a 6 inch incline.  I suspect you likely have gastroparesis secondary to diabetes. It is very important that you keep your diabetes well controlled.  Higher your blood sugars are, this lowers her stomach will empty. Eat 4-6 small meals daily. Follow a low-fat/low fiber diet.  Avoid raw fruits and vegetables. Please see separate handout for gastroparesis.  Instructions for fatty liver: Recommend 1-2# weight loss per week until ideal body weight through exercise & diet. Low fat/cholesterol diet.   Avoid sweets, sodas, fruit juices, sweetened beverages like tea, etc. Gradually increase exercise from 15 min daily up to 1 hr per day 5 days/week. Limit alcohol use.  For loose stools, you can try adding Metamucil or benefiber daily to see if this will help with stool consistency. Continue to avoid dietary triggers.  We will see you back in 4 months or sooner if needed.   Aliene Altes, PA-C [facility]

## 2020-06-10 ENCOUNTER — Encounter: Payer: Self-pay | Admitting: Gastroenterology

## 2020-06-10 DIAGNOSIS — I10 Essential (primary) hypertension: Secondary | ICD-10-CM | POA: Insufficient documentation

## 2020-06-10 LAB — HEPATITIS C ANTIBODY
Hepatitis C Ab: NONREACTIVE
SIGNAL TO CUT-OFF: 0.01 (ref <1.00)

## 2020-06-10 LAB — HEPATITIS B SURFACE ANTIGEN: Hepatitis B Surface Ag: NONREACTIVE

## 2020-06-10 LAB — IRON,TIBC AND FERRITIN PANEL
%SAT: 33 % (calc) (ref 20–48)
Ferritin: 257 ng/mL (ref 38–380)
Iron: 134 ug/dL (ref 50–180)
TIBC: 411 mcg/dL (calc) (ref 250–425)

## 2020-06-10 LAB — HEPATITIS B SURFACE ANTIBODY,QUALITATIVE: Hep B S Ab: NONREACTIVE

## 2020-06-10 LAB — HEPATIC FUNCTION PANEL
AG Ratio: 1.5 (calc) (ref 1.0–2.5)
ALT: 16 U/L (ref 9–46)
AST: 11 U/L (ref 10–40)
Albumin: 3.8 g/dL (ref 3.6–5.1)
Alkaline phosphatase (APISO): 105 U/L (ref 36–130)
Bilirubin, Direct: 0.1 mg/dL (ref 0.0–0.2)
Globulin: 2.6 g/dL (calc) (ref 1.9–3.7)
Indirect Bilirubin: 0.6 mg/dL (calc) (ref 0.2–1.2)
Total Bilirubin: 0.7 mg/dL (ref 0.2–1.2)
Total Protein: 6.4 g/dL (ref 6.1–8.1)

## 2020-06-10 LAB — HEPATITIS B CORE ANTIBODY, TOTAL: Hep B Core Total Ab: NONREACTIVE

## 2020-06-10 LAB — HEPATITIS A ANTIBODY, TOTAL: Hepatitis A AB,Total: NONREACTIVE

## 2020-06-10 NOTE — Assessment & Plan Note (Addendum)
Patient continues with early satiety and sensation that food sits in the epigastric area for a while.  Has been struggling with frequent nausea with vomiting and was started on Reglan during hospitalization in December 2020 for suspected gastroparesis in the setting of uncontrolled diabetes.  He has been working on better control diabetes and reports his most recent A1c was around 7.  He has not had any Reglan in 1-2 months and has not had any significant trouble with nausea or vomiting for several months.  He does report daily GERD symptoms and occasional nocturnal GERD symptoms which may be secondary to gastroparesis.  Due to ongoing early satiety, will go ahead and obtain gastric emptying study to confirm gastroparesis diagnosis.  Stop omeprazole and start Protonix 40 mg daily 30 minutes before first meal. I counseled him on the importance of maintaining tight control of his blood sugars. Advised he eat 4-6 small meals daily. Follow a low-fat/low fiber diet.  Avoid raw fruits and vegetables. Provided gastroparesis handout. Follow-up in 4 months.

## 2020-06-10 NOTE — Assessment & Plan Note (Signed)
Patient has been experiencing frequent postprandial diarrhea that started back in November 2020 in the setting of nausea and vomiting.  This has improved quite a bit, currently with 2, maybe 3 BMs daily. Some stools are formed and others may be loose to watery.  Notes sweets will cause diarrhea.  Celiac serologies negative and 2017. Query whether patient had viral gastroenteritis, now with mild post infectious IBS.  As symptoms are mild, not particularly bothersome, and no alarm symptoms, we will continue to monitor.  I did advise that he could try adding Metamucil daily to help with stool consistency.  He should continue to avoid dietary triggers.  Follow-up in 4 months.

## 2020-06-10 NOTE — Assessment & Plan Note (Signed)
Blood pressure elevated today at 171/106.  He is asymptomatic.  He has not taken blood pressure medications today.  Advised to take his medications when he gets home and call PCP for further evaluation.  Also advised that he will develop any chest pain, shortness of breath, lightheadedness, dizziness, significant headache, or blurry vision, he is to proceed to the emergency room.

## 2020-06-10 NOTE — Assessment & Plan Note (Signed)
Chronic history of GERD.  Not adequately controlled on omeprazole 20 mg nightly.  Suspect patient likely has gastroparesis in the setting of diabete which is likely influencing GERD symptoms.  Plan: Stop omeprazole 20 mg and start Protonix 40 mg daily 30 minutes before first meal. Counseled on GERD diet/lifestyle. Counseled on gastroparesis diet/lifestyle. Gastroparesis handout provided. Plan to follow-up in 4 months.

## 2020-06-10 NOTE — Assessment & Plan Note (Signed)
Addressed under early satiety.

## 2020-06-10 NOTE — Assessment & Plan Note (Signed)
Noted on ultrasound in December 2020.  No signs or symptoms of decompensated liver disease.  He did have elevated LFTs during hospitalization in December 2020 which I suspect was in the setting of an acute illness.  Elevated LFTs addressed below.  We are planning to update HFP, check hepatitis A, B, and C, and screen for hemochromatosis.  Instructions for fatty liver: Recommend 1-2# weight loss per week until ideal body weight through exercise & diet. Low fat/cholesterol diet.   Avoid sweets, sodas, fruit juices, sweetened beverages like tea, etc. Gradually increase exercise from 15 min daily up to 1 hr per day 5 days/week. Limit alcohol use.

## 2020-06-10 NOTE — Assessment & Plan Note (Addendum)
Patient was noted to have elevated LFTs during hospitalization in December 2020.  AST/ALT 136/127 and total bilirubin 2.6.  After receiving IV fluids, AST/ALT decreased to 62/85, total bilirubin decreased to 1.8.  Suspect this was likely in the setting of an acute illness.  He did have ultrasound completed during hospitalization which revealed fatty liver and hemangioma, CBD and gallbladder appeared normal.  No significant history of alcohol use.  No history of IV or intranasal drug use.  No known hepatitis exposures.  1 professional tattoo.  We will plan to repeat HFP, check for hepatitis A, B, and C, and screen for hemochromatosis.

## 2020-06-15 ENCOUNTER — Encounter (HOSPITAL_COMMUNITY): Payer: BC Managed Care – PPO

## 2020-10-09 NOTE — Progress Notes (Deleted)
Referring Provider: Vidal Schwalbe, MD Primary Care Physician:  Vidal Schwalbe, MD Primary GI Physician: Dr. Gala Romney  No chief complaint on file.   HPI:   [name] Gregory is a 50 y.o. male presenting today for follow-up. History of RUQ abdominal pain, nausea, vomiting thought to be secondary to gastroparesis in the setting of uncontrolled diabetes, hemangioma of the liver, fatty liver, elevated LFTs during hospitalization in December 2020 that later normalized, and intermittent diarrhea with stool studies negative in December 2020.  EGD September 2017 with gastritis, reflux esophagitis.  Colonoscopy in September 2017 with diverticulosis, due for repeat exam 2027. Prior CTs have been unrevealing. HIDA in 2018 within normal limits. Celiac serologies wnl in 2017. GES not completed.   Last seen in our office 06/09/2020. Reported no nausea or vomiting in the last several months. Breakthrough reflux symptoms daily along with intermittent nocturnal reflux taking omeprazole 20 mg at night. Also with early satiety. Had not taken Reglan in about 1-2 months. Has been working on better control of his blood sugars and A1c had improved to 7, down from 9 range. Typically with 2-3 BMs daily that started out formed and then may get loose or watery. Postprandial diarrhea did not seem to be a problem. Noticed sweets caused looser stools. Taking Imodium maybe once a week. Regarding elevated LFTs, reported alcohol about once a month, no known exposure to hepatitis, no IV or intranasal drug use, had 1 tattoo professionally. Plan included stopping omeprazole and starting Protonix 40 mg daily 30 minutes before breakfast, counseled on GERD and gastroparesis diet, GES to confirm gastroparesis, monitor for any worsening diarrhea, could try Metamucil to help with stool consistency, update HFP, check for hepatitis A, B, and C, and iron panel. Also counseled on fatty liver.  Labs 06/09/2020: LFTs returned to normal. No immunity to  hepatitis A. Hepatitis B negative and without immunity. Hepatitis C negative. Iron panel within normal limits. Rx for Hep A/B vaccine was mailed to patient.   Gastric emptying study was not completed.  Today:  GERD:   Nausea/Vomiting:   BMs-  Past Medical History:  Diagnosis Date  . Complication of anesthesia    WOKE UP DURING SURGERY  . Diabetes ([place])   . GERD (gastroesophageal reflux disease)   . Hemangioma of liver   . Hypertension   . Sleep apnea   . URI (upper respiratory infection)     Past Surgical History:  Procedure Laterality Date  . BIOPSY  05/04/2016   Procedure: BIOPSY;  Surgeon: Daneil Dolin, MD;  [facility]: [facility];  Service: Endoscopy;;  gastric bx  . COLONOSCOPY WITH PROPOFOL N/A 05/04/2016    PROPOFOL;  Surgeon: Daneil Dolin, MD; diverticulosis throughout the entire colon, otherwise normal.  Repeat due in 2027.  Marland Kitchen ESOPHAGOGASTRODUODENOSCOPY (EGD) WITH PROPOFOL N/A 05/04/2016   PROPOFOL;  Surgeon: Daneil Dolin, MD; LA grade B esophagitis, gastritis, small hiatal hernia.  Marland Kitchen EYE SURGERY    . SALIVARY GLAND SURGERY  1992    Current Outpatient Medications  Medication Sig Dispense Refill  . amLODipine (NORVASC) 10 MG tablet Take 10 mg by mouth daily.    Marland Kitchen buPROPion (ZYBAN) 150 MG 12 hr tablet Take 150 mg by mouth 2 (two) times daily.    . carvedilol (COREG) 25 MG tablet Take 25 mg by mouth 2 (two) times daily with a meal.    . citalopram (CELEXA) 20 MG tablet Take 1 tablet by mouth daily.    Marland Kitchen gabapentin (  NEURONTIN) 300 MG capsule Take 300 mg by mouth 2 (two) times daily. Take one capsule by mouth in the morning and two at bedtime for nerve pain     . glimepiride (AMARYL) 2 MG tablet Take 1 tablet (2 mg total) by mouth daily before breakfast. 30 tablet 0  . irbesartan-hydrochlorothiazide (AVALIDE) 300-12.5 MG tablet Take 1 tablet by mouth daily.    Marland Kitchen loperamide (IMODIUM A-D) 2 MG tablet Take 1 tablet (2 mg total) by mouth 4 (four) times daily as needed  for diarrhea or loose stools. 30 tablet 0  . LORazepam (ATIVAN) 0.5 MG tablet Take 0.5 mg by mouth every 8 (eight) hours as needed for anxiety.     . metFORMIN (GLUCOPHAGE-XR) 500 MG 24 hr tablet Take 1 tablet by mouth daily.    . pantoprazole (PROTONIX) 40 MG tablet Take 1 tablet (40 mg total) by mouth daily before breakfast. 30 tablet 5  . Vitamin D, Ergocalciferol, (DRISDOL) 50000 units CAPS capsule Take 1 capsule by mouth 3 (three) times a week.     No current facility-administered medications for this visit.    Allergies as of 10/11/2020  . (No Known Allergies)    Family History  Problem Relation Age of Onset  . Hypertension Mother   . CVA Father   . Diabetes Father   . Throat cancer Father   . Heart disease Brother 6  . Cervical cancer Maternal Grandmother   . Throat cancer Maternal Grandmother   . Throat cancer Maternal Grandfather   . Bladder Cancer Maternal Grandfather   . Cirrhosis Maternal Grandfather   . Colon cancer Neg Hx     Social History   Socioeconomic History  . Marital status: Married    Spouse name: Not on file  . Number of children: Not on file  . Years of education: Not on file  . Highest education level: Not on file  Occupational History  . Occupation: administrative for [employer]  Tobacco Use  . Smoking status: Current Every Day Smoker    Packs/day: 0.25    Years: 36.00    Pack years: 9.00    Types: Cigarettes  . Smokeless tobacco: Never Used  . Tobacco comment: 4cigs a day as of 07/19/17 ep  Vaping Use  . Vaping Use: Former  Substance and Sexual Activity  . Alcohol use: Yes    Comment: maybe once a month  . Drug use: No  . Sexual activity: Not on file  Other Topics Concern  . Not on file  Social History Narrative  . Not on file   Social Determinants of Health   Financial Resource Strain: Not on file  Food Insecurity: Not on file  Transportation Needs: Not on file  Physical Activity: Not on file  Stress: Not on file  Social  Connections: Not on file    Review of Systems: Gen: Denies fever, chills, anorexia. Denies fatigue, weakness, weight loss.  CV: Denies chest pain, palpitations, syncope, peripheral edema, and claudication. Resp: Denies dyspnea at rest, cough, wheezing, coughing up blood, and pleurisy. GI: Denies vomiting blood, jaundice, and fecal incontinence.   Denies dysphagia or odynophagia. Derm: Denies rash, itching, dry skin Psych: Denies depression, anxiety, memory loss, confusion. No homicidal or suicidal ideation.  Heme: Denies bruising, bleeding, and enlarged lymph nodes.  Physical Exam: There were no vitals taken for this visit. General:   Alert and oriented. No distress noted. Pleasant and cooperative.  Head:  Normocephalic and atraumatic. Eyes:  Conjuctiva clear without  scleral icterus. Mouth:  Oral mucosa pink and moist. Good dentition. No lesions. Heart:  S1, S2 present without murmurs appreciated. Lungs:  Clear to auscultation bilaterally. No wheezes, rales, or rhonchi. No distress.  Abdomen:  +BS, soft, non-tender and non-distended. No rebound or guarding. No HSM or masses noted. Msk:  Symmetrical without gross deformities. Normal posture. Extremities:  Without edema. Neurologic:  Alert and  oriented x4 Psych:  Alert and cooperative. Normal mood and affect.

## 2020-10-11 ENCOUNTER — Ambulatory Visit: Payer: BC Managed Care – PPO | Admitting: Gastroenterology

## 2020-10-11 ENCOUNTER — Encounter: Payer: Self-pay | Admitting: Internal Medicine

## 2020-11-25 DIAGNOSIS — E119 Type 2 diabetes mellitus without complications: Secondary | ICD-10-CM | POA: Diagnosis not present

## 2020-11-25 DIAGNOSIS — I10 Essential (primary) hypertension: Secondary | ICD-10-CM | POA: Diagnosis not present

## 2020-12-20 DIAGNOSIS — E559 Vitamin D deficiency, unspecified: Secondary | ICD-10-CM | POA: Diagnosis not present

## 2020-12-20 DIAGNOSIS — E119 Type 2 diabetes mellitus without complications: Secondary | ICD-10-CM | POA: Diagnosis not present

## 2020-12-20 DIAGNOSIS — E611 Iron deficiency: Secondary | ICD-10-CM | POA: Diagnosis not present

## 2020-12-20 DIAGNOSIS — Z125 Encounter for screening for malignant neoplasm of prostate: Secondary | ICD-10-CM | POA: Diagnosis not present

## 2020-12-23 DIAGNOSIS — R002 Palpitations: Secondary | ICD-10-CM | POA: Diagnosis not present

## 2020-12-23 DIAGNOSIS — E611 Iron deficiency: Secondary | ICD-10-CM | POA: Diagnosis not present

## 2020-12-23 DIAGNOSIS — K3184 Gastroparesis: Secondary | ICD-10-CM | POA: Diagnosis not present

## 2020-12-23 DIAGNOSIS — E119 Type 2 diabetes mellitus without complications: Secondary | ICD-10-CM | POA: Diagnosis not present

## 2020-12-23 DIAGNOSIS — E559 Vitamin D deficiency, unspecified: Secondary | ICD-10-CM | POA: Diagnosis not present

## 2021-01-12 DIAGNOSIS — Z20828 Contact with and (suspected) exposure to other viral communicable diseases: Secondary | ICD-10-CM | POA: Diagnosis not present

## 2021-01-12 DIAGNOSIS — J3489 Other specified disorders of nose and nasal sinuses: Secondary | ICD-10-CM | POA: Diagnosis not present

## 2021-01-12 DIAGNOSIS — R519 Headache, unspecified: Secondary | ICD-10-CM | POA: Diagnosis not present

## 2021-01-12 DIAGNOSIS — J029 Acute pharyngitis, unspecified: Secondary | ICD-10-CM | POA: Diagnosis not present

## 2021-01-14 DIAGNOSIS — R519 Headache, unspecified: Secondary | ICD-10-CM | POA: Diagnosis not present

## 2021-01-14 DIAGNOSIS — U071 COVID-19: Secondary | ICD-10-CM | POA: Diagnosis not present

## 2021-04-15 DIAGNOSIS — E119 Type 2 diabetes mellitus without complications: Secondary | ICD-10-CM | POA: Diagnosis not present

## 2021-04-15 DIAGNOSIS — I10 Essential (primary) hypertension: Secondary | ICD-10-CM | POA: Diagnosis not present

## 2021-06-02 ENCOUNTER — Ambulatory Visit: Payer: BC Managed Care – PPO | Admitting: Pulmonary Disease

## 2021-06-02 ENCOUNTER — Encounter: Payer: Self-pay | Admitting: Pulmonary Disease

## 2021-06-02 ENCOUNTER — Other Ambulatory Visit: Payer: Self-pay

## 2021-06-02 DIAGNOSIS — R0982 Postnasal drip: Secondary | ICD-10-CM

## 2021-06-02 DIAGNOSIS — Z9989 Dependence on other enabling machines and devices: Secondary | ICD-10-CM

## 2021-06-02 DIAGNOSIS — G4733 Obstructive sleep apnea (adult) (pediatric): Secondary | ICD-10-CM

## 2021-06-02 NOTE — Progress Notes (Signed)
Subjective:    Patient ID: [name] Gregory, male    DOB: [birth year], 50 y.o.   MRN: [id number]  HPI  50 yo obese man presents to re-establish care for obstructive sleep apnea. 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 DME - [employer], was on CPAP 13 cm  PMH - hypertension controlled with 4 medications  He was diagnosed in 2018 and placed on CPAP with good results and good improvement in his daytime somnolence and fatigue.  He lost weight from 313 all the way down to 265 pounds but now has regained back to 304 pounds.  He reports that his CPAP stopped working a few months ago and for the last 3 months he has been without the machine.  He reports increased somnolence and loud snoring. Epworth sleepiness score is 8. Bedtime is around 11 PM, sleep latency is minimal, he sleeps on his side with 2 pillows, reports 5 or more nocturnal awakenings and is out of bed at 7:15 AM feeling tired with dryness of mouth and occasional headache.  He also reports congestion for several weeks with rattling in his chest, he reports nasal drainage.  He has tried OTC Delsym without much relief. Chest x-ray 12/20 was reviewed which shows clear lungs. He smokes, pack will last him 3 days. He had COVID infection 12/2020 but states that he did not have any respiratory symptoms  Significant tests/ events reviewed 03/2017 NPSG >> severe , AHI 62/h, lowest desatn 70 %   Past Medical History:  Diagnosis Date   Complication of anesthesia    WOKE UP DURING SURGERY   Diabetes ([place])    GERD (gastroesophageal reflux disease)    Hemangioma of liver    Hypertension    Sleep apnea    URI (upper respiratory infection)    Past Surgical History:  Procedure Laterality Date   BIOPSY  05/04/2016   Procedure: BIOPSY;  Surgeon: Daneil Dolin, MD;  [facility];  Service: Endoscopy;;  gastric bx   COLONOSCOPY WITH PROPOFOL N/A 05/04/2016    PROPOFOL;  Surgeon: Daneil Dolin, MD;  diverticulosis throughout the entire colon, otherwise normal.  Repeat due in 2027.   ESOPHAGOGASTRODUODENOSCOPY (EGD) WITH PROPOFOL N/A 05/04/2016   PROPOFOL;  Surgeon: Daneil Dolin, MD; LA grade B esophagitis, gastritis, small hiatal hernia.   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  Tobacco Use   Smoking status: Every Day    Packs/day: 0.25    Years: 36.00    Pack years: 9.00    Types: Cigarettes   Smokeless tobacco: Never   Tobacco comments:    4cigs a day as of 07/19/17 ep  Vaping Use   Vaping Use: Former  Substance and Sexual Activity   Alcohol use: Yes    Comment: maybe once a month   Drug use: No   Sexual activity: Not on file  Other Topics Concern   Not on file  Social History Narrative   Not on file   Social Determinants of Health   Financial Resource Strain: Not on file  Food Insecurity:  Not on file  Transportation Needs: Not on file  Physical Activity: Not on file  Stress: Not on file  Social Connections: Not on file  Intimate Partner Violence: Not on file     Family History  Problem Relation Age of Onset   Hypertension Mother    CVA Father    Diabetes Father    Throat cancer Father    Heart disease Brother 24   Cervical cancer Maternal Grandmother    Throat cancer Maternal Grandmother    Throat cancer Maternal Grandfather    Bladder Cancer Maternal Grandfather    Cirrhosis Maternal Grandfather    Colon cancer Neg Hx       Review of Systems Constitutional: negative for anorexia, fevers and sweats  Eyes: negative for irritation, redness and visual disturbance  Ears, nose, mouth, throat, and face: negative for earaches, epistaxis, nasal congestion  and sore throat  Respiratory: negative for dyspnea on exertion and wheezing  Cardiovascular: negative for chest pain, dyspnea, lower extremity edema, orthopnea, palpitations and syncope  Gastrointestinal: negative for abdominal pain, constipation, diarrhea, melena, nausea and vomiting  Genitourinary:negative for dysuria, frequency and hematuria  Hematologic/lymphatic: negative for bleeding, easy bruising and lymphadenopathy  Musculoskeletal:negative for arthralgias, muscle weakness and stiff joints  Neurological: negative for coordination problems, gait problems, headaches and weakness  Endocrine: negative for diabetic symptoms including polydipsia, polyuria and weight loss     Objective:   Physical Exam  Gen. Pleasant, obese, in no distress, normal affect ENT - no pallor,icterus, no post nasal drip, class 2-3 airway Neck: No JVD, no thyromegaly, no carotid bruits Lungs: no use of accessory muscles, no dullness to percussion, decreased without rales or rhonchi  Cardiovascular: Rhythm regular, heart sounds  normal, no murmurs or gallops, no peripheral edema Abdomen: soft and non-tender, no hepatosplenomegaly, BS normal. Musculoskeletal: No deformities, no cyanosis or clubbing Neuro:  alert, non focal, no tremors       Assessment & Plan:

## 2021-06-02 NOTE — Assessment & Plan Note (Signed)
We will send in prescription for auto CPAP 10 to 15 cm at his request to [facility] which would be a new DME. I discussed different types of fullface mask and AirFit F30 would be suitable for him. Unfortunately he has gained his weight back again  Weight loss encouraged, compliance with goal of at least 4-6 hrs every night is the expectation. Advised against medications with sedative side effects Cautioned against driving when sleepy - understanding that sleepiness will vary on a day to day basis

## 2021-06-02 NOTE — Patient Instructions (Addendum)
AutoCPAP 10-15 cm with FF mask Rx will be sent to [facility] after 30 days   CXR today Zyrtec OTC daily x 2 weeks OTC mucinex 600 mg as needed for cough

## 2021-06-02 NOTE — Assessment & Plan Note (Signed)
He has a chronic cough which is most likely at this time of the year related to postnasal drip or to his smoking. Smoking cessation was encouraged.  I have asked him to try OTC Zyrtec.  If persistent we will proceed with chest x-ray He has clear sputum production so do not feel like he needs an antibiotic

## 2021-06-28 DIAGNOSIS — J069 Acute upper respiratory infection, unspecified: Secondary | ICD-10-CM | POA: Diagnosis not present

## 2021-06-28 DIAGNOSIS — J029 Acute pharyngitis, unspecified: Secondary | ICD-10-CM | POA: Diagnosis not present

## 2021-06-28 DIAGNOSIS — R059 Cough, unspecified: Secondary | ICD-10-CM | POA: Diagnosis not present

## 2021-07-04 DIAGNOSIS — J029 Acute pharyngitis, unspecified: Secondary | ICD-10-CM | POA: Diagnosis not present

## 2021-07-04 DIAGNOSIS — J111 Influenza due to unidentified influenza virus with other respiratory manifestations: Secondary | ICD-10-CM | POA: Diagnosis not present

## 2021-07-04 DIAGNOSIS — R509 Fever, unspecified: Secondary | ICD-10-CM | POA: Diagnosis not present

## 2021-07-04 DIAGNOSIS — R52 Pain, unspecified: Secondary | ICD-10-CM | POA: Diagnosis not present

## 2021-07-04 DIAGNOSIS — R059 Cough, unspecified: Secondary | ICD-10-CM | POA: Diagnosis not present

## 2021-07-13 ENCOUNTER — Telehealth: Payer: Self-pay | Admitting: Pulmonary Disease

## 2021-07-13 DIAGNOSIS — Z9989 Dependence on other enabling machines and devices: Secondary | ICD-10-CM

## 2021-07-13 DIAGNOSIS — G4733 Obstructive sleep apnea (adult) (pediatric): Secondary | ICD-10-CM

## 2021-07-13 NOTE — Telephone Encounter (Signed)
Order for the cpap has been placed.  Nothing further is needed.

## 2021-07-28 ENCOUNTER — Telehealth: Payer: Self-pay | Admitting: Pulmonary Disease

## 2021-08-03 NOTE — Telephone Encounter (Signed)
Order faxed again.

## 2021-08-03 NOTE — Telephone Encounter (Signed)
Confirmation received.

## 2021-08-03 NOTE — Telephone Encounter (Signed)
Vallarie Mare - will you please re-send this order for [name] Gregory's pharmacy?

## 2021-10-12 HISTORY — PX: CARDIAC CATHETERIZATION: SHX172

## 2021-11-12 DIAGNOSIS — I428 Other cardiomyopathies: Secondary | ICD-10-CM

## 2021-11-12 HISTORY — DX: Other cardiomyopathies: I42.8

## 2021-12-29 LAB — BASIC METABOLIC PANEL
BUN: 28 — AB (ref 4–21)
Creatinine: 1 (ref 0.6–1.3)
Glucose: 376

## 2021-12-29 LAB — MICROALBUMIN / CREATININE URINE RATIO: Microalb Creat Ratio: 8

## 2021-12-29 LAB — HEMOGLOBIN A1C: Hemoglobin A1C: 13.2

## 2021-12-29 LAB — VITAMIN D 25 HYDROXY (VIT D DEFICIENCY, FRACTURES): Vit D, 25-Hydroxy: 90

## 2021-12-29 LAB — COMPREHENSIVE METABOLIC PANEL: eGFR: 99

## 2022-01-02 ENCOUNTER — Encounter: Payer: Self-pay | Admitting: Pulmonary Disease

## 2022-01-02 ENCOUNTER — Ambulatory Visit (INDEPENDENT_AMBULATORY_CARE_PROVIDER_SITE_OTHER): Payer: BC Managed Care – PPO | Admitting: Pulmonary Disease

## 2022-01-02 VITALS — BP 112/90 | HR 87 | Temp 98.3°F | Ht 74.0 in | Wt 300.2 lb

## 2022-01-02 DIAGNOSIS — G4733 Obstructive sleep apnea (adult) (pediatric): Secondary | ICD-10-CM | POA: Diagnosis not present

## 2022-01-02 DIAGNOSIS — J9611 Chronic respiratory failure with hypoxia: Secondary | ICD-10-CM

## 2022-01-02 DIAGNOSIS — Z9989 Dependence on other enabling machines and devices: Secondary | ICD-10-CM | POA: Diagnosis not present

## 2022-01-02 DIAGNOSIS — R0602 Shortness of breath: Secondary | ICD-10-CM

## 2022-01-02 NOTE — Patient Instructions (Signed)
  X Rx for medium full face mask - [facility]  , airfit F 30   X Schedule PFTs

## 2022-01-02 NOTE — Assessment & Plan Note (Signed)
CPAP download was reviewed on auto CPAP settings 10 to 15 cm, average pressure is 14.3 cm with maximum pressure 14.8, residual AHI is minimal although certain nights AHI seems to extend up to 10/hour, compliance is fantastic more than 8 to 9 hours every night. He has a large leak. We will switch him to a medium full facemask, I asked him to trial AirFit F30 He is very compliant and CPAP is certainly helped improve his daytime somnolence and fatigue.  We also discussed cardiovascular benefits and heart failure  Weight loss encouraged, compliance with goal of at least 4-6 hrs every night is the expectation. Advised against medications with sedative side effects Cautioned against driving when sleepy - understanding that sleepiness will vary on a day to day basis

## 2022-01-02 NOTE — Assessment & Plan Note (Signed)
  He was found to desaturate at PCP office and oxygen has been ordered.  We will schedule PFTs to see if he has underlying chronic lung disease based on his long history of smoking

## 2022-01-02 NOTE — Progress Notes (Signed)
   Subjective:    Patient ID: [name] Gregory, male    DOB: 14-Aug-1971, 51 y.o.   MRN: [id number]  HPI  51 yo obese smoker for FU of obstructive sleep apnea. 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 DME - [employer], was on CPAP 13 cm  He smoked about a pack per day, about 25 pack years, quit 11/2021   PMH - hypertension controlled with 4 medications HFrEF - 20-25 %  Chief Complaint  Patient presents with   Follow-up    Patient would like to change to a full face mask. Has been diagnosed with CHF and is supposed to be getting oxygen.   After his last visit, we got him a new auto CPAP 10 to 15 cm He preferred nasal mask but reports that this is leaking air too much and would like to switch to fullface.  He feels better rested , spouse accompanies, no breakthrough snoring or excessive daytime somnolence.  In the interim he has been diagnosed with chronic systolic heart failure, left heart cath did not show obstructive CAD, LVEDP was 31.  He had a ED visit for hyperkalemia 6.5 and irbesartan was stopped.  He is on Entresto and Aldactone Lasix is being titrated, weight is 300 pounds.  At a PCP visit he was found to have low saturations and oxygen has been advised, he has not obtained this yet.  He smokes about a pack per day, about 25 pack years  I reviewed cardiology and heart failure records  Significant tests/ events reviewed  03/2017 NPSG >> severe , AHI 62/h, lowest desatn 70 %  Review of Systems neg for any significant sore throat, dysphagia, itching, sneezing, nasal congestion or excess/ purulent secretions, fever, chills, sweats, unintended wt loss, pleuritic or exertional cp, hempoptysis, orthopnea pnd or change in chronic leg swelling. Also denies presyncope, palpitations, heartburn, abdominal pain, nausea, vomiting, diarrhea or change in bowel or urinary habits, dysuria,hematuria, rash, arthralgias, visual complaints, headache, numbness weakness or  ataxia.     Objective:   Physical Exam  Gen. Pleasant, obese, in no distress, normal affect ENT - no pallor,icterus, no post nasal drip, class 2-3 airway Neck: No JVD, no thyromegaly, no carotid bruits Lungs: no use of accessory muscles, no dullness to percussion, decreased without rales or rhonchi  Cardiovascular: Rhythm regular, heart sounds  normal, no murmurs or gallops, no peripheral edema Abdomen: soft and non-tender, no hepatosplenomegaly, BS normal. Musculoskeletal: No deformities, no cyanosis or clubbing Neuro:  alert, non focal, no tremors       Assessment & Plan:

## 2022-01-18 ENCOUNTER — Ambulatory Visit (INDEPENDENT_AMBULATORY_CARE_PROVIDER_SITE_OTHER): Payer: BC Managed Care – PPO | Admitting: Pulmonary Disease

## 2022-01-18 DIAGNOSIS — R0602 Shortness of breath: Secondary | ICD-10-CM | POA: Diagnosis not present

## 2022-01-18 LAB — PULMONARY FUNCTION TEST
DL/VA % pred: 107 %
DL/VA: 4.71 ml/min/mmHg/L
DLCO cor % pred: 68 %
DLCO cor: 22.88 ml/min/mmHg
DLCO unc % pred: 67 %
DLCO unc: 22.62 ml/min/mmHg
FEF 25-75 Post: 4.79 L/sec
FEF 25-75 Pre: 3.54 L/sec
FEF2575-%Change-Post: 35 %
FEF2575-%Pred-Post: 127 %
FEF2575-%Pred-Pre: 93 %
FEV1-%Change-Post: 10 %
FEV1-%Pred-Post: 89 %
FEV1-%Pred-Pre: 80 %
FEV1-Post: 3.45 L
FEV1-Pre: 3.13 L
FEV1FVC-%Change-Post: 3 %
FEV1FVC-%Pred-Pre: 104 %
FEV6-%Change-Post: 7 %
FEV6-%Pred-Post: 84 %
FEV6-%Pred-Pre: 78 %
FEV6-Post: 3.99 L
FEV6-Pre: 3.72 L
FEV6FVC-%Change-Post: 0 %
FEV6FVC-%Pred-Post: 102 %
FEV6FVC-%Pred-Pre: 102 %
FVC-%Change-Post: 6 %
FVC-%Pred-Post: 82 %
FVC-%Pred-Pre: 77 %
FVC-Post: 3.99 L
FVC-Pre: 3.73 L
Post FEV1/FVC ratio: 86 %
Post FEV6/FVC ratio: 100 %
Pre FEV1/FVC ratio: 84 %
Pre FEV6/FVC Ratio: 100 %
RV % pred: 118 %
RV: 2.66 L
TLC % pred: 96 %
TLC: 7.52 L

## 2022-01-18 NOTE — Patient Instructions (Signed)
Full PFT Performed Today  

## 2022-01-18 NOTE — Progress Notes (Signed)
Full PFT Performed Today  

## 2022-01-20 ENCOUNTER — Encounter: Payer: Self-pay | Admitting: Pulmonary Disease

## 2022-01-20 NOTE — Telephone Encounter (Signed)
Received a message from patient asking for his PFT results.   Dr. Elsworth Soho, can you please advise? Thanks!

## 2022-02-06 ENCOUNTER — Telehealth: Payer: Self-pay | Admitting: Pulmonary Disease

## 2022-02-06 NOTE — Telephone Encounter (Signed)
Called and spoke to [place] with [facility]. She states she was calling to get more documentation/notes/opinions on whether patient needs O2 or not. She states he was walked with her and he did not desaturate but patient continues to wear oxygen. Read her result notes from PFT and what Dr.Alvas response to this message thread was and she voiced understanding. Offered to send ov notes/result notes and Irving Burton declined for now but will reach back out if anything further is needed. Nothing further needed at this time.

## 2022-02-07 NOTE — Progress Notes (Signed)
Patient ID: [name] Gregory, male    DOB: July 24, 1971, 51 y.o.   MRN: [id number]  HPI  Mr Warga is a 51 y/o male with a history of DM, HTN, GERD, liver hemangioma, sleep apnea, tobacco use and chronic heart failure.   Echo report from 11/18/21 reviewed and showed an EF of 20-25% along with moderate LAE, trivial MR and borderline pulmonary HTN.   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 Found to be hyperkalemic with potassium of 6.5 along with A1c of 12.6%. Started spironolactone ~ 1 month prior. Given lasix with improvement of potassium level and he was released.    He presents today for his initial visit with a chief complaint of moderate fatigue with minimal exertion. Describes this as chronic in nature having been present for several months. He has associated shortness of breath, intermittent chest pain, pedal edema, palpitations, abdominal distention, anxiety, depression, joint pain and constant light-headedness along with this.   He says that he doesn't feel like the bumex is working as well as the furosemide and he's been constantly light-headed since his entresto dose was increased to 97/'103mg'$ . He is asking to get established with [facility] cardiology since they are affiliated with [place] as he's looking to move all his care to [facility] to make it easier on getting to the appointments.   Weighing daily and wearing compression socks daily. Wearing oxygen at 2L around the clock.   He doesn't think he's drinking more than 60 ounces day but his wife feels like he's drinking way more than that.   Past Medical History:  Diagnosis Date   CHF (congestive heart failure) (HCC)    Complication of anesthesia    WOKE UP DURING SURGERY    Diabetes (HCC)    GERD (gastroesophageal reflux disease)    Hemangioma of liver    Hypertension    Sleep apnea    URI (upper respiratory infection)    Past Surgical History:  Procedure Laterality Date   BIOPSY  05/04/2016   Procedure: BIOPSY;  Surgeon: Daneil Dolin, MD;  [facility];  Service: Endoscopy;;  gastric bx   COLONOSCOPY WITH PROPOFOL N/A 05/04/2016    PROPOFOL;  Surgeon: Daneil Dolin, MD; diverticulosis throughout the entire colon, otherwise normal.  Repeat due in 2027.   ESOPHAGOGASTRODUODENOSCOPY (EGD) WITH PROPOFOL N/A 05/04/2016   PROPOFOL;  Surgeon: Daneil Dolin, MD; LA grade B esophagitis, gastritis, small hiatal hernia.   EYE SURGERY     SALIVARY GLAND SURGERY  1992   Family History  Problem Relation Age of Onset   Hypertension Mother    CVA Father    Diabetes Father    Throat cancer Father    Heart disease Brother 105   Cervical cancer Maternal Grandmother    Throat cancer Maternal Grandmother    Throat cancer Maternal Grandfather    Bladder Cancer Maternal Grandfather    Cirrhosis Maternal Grandfather    Colon cancer Neg Hx    Social History   Tobacco Use   Smoking status: Former    Packs/day: 0.25    Years: 36.00    Total pack years: 9.00    Types: Cigarettes    Quit date: 11/18/2021  Years since quitting: 0.2   Smokeless tobacco: Never   Tobacco comments:    QUIT SMOKING IN APRIL  Substance Use Topics   Alcohol use: Yes    Comment: maybe once a month   No Known Allergies Prior to Admission medications   Medication Sig Start Date End Date Taking? Authorizing Provider  amLODipine (NORVASC) 10 MG tablet Take 10 mg by mouth daily. 03/11/16  Yes [provider]  atorvastatin (LIPITOR) 20 MG tablet Take 20 mg by mouth daily. 11/03/21  Yes [provider]  bumetanide (BUMEX) 1 MG tablet Take 2 mg by mouth daily.   Yes [provider]  buPROPion (ZYBAN) 150 MG 12 hr tablet Take 150 mg by mouth 2 (two) times  daily.   Yes [provider]  carvedilol (COREG) 25 MG tablet Take 25 mg by mouth 2 (two) times daily with a meal.   Yes [provider]  citalopram (CELEXA) 20 MG tablet Take 1 tablet by mouth daily. 04/24/17  Yes [provider]  gabapentin (NEURONTIN) 300 MG capsule Take 300 mg by mouth 2 (two) times daily. Take one capsule by mouth in the morning and two at bedtime for nerve pain    Yes [provider]  LORazepam (ATIVAN) 0.5 MG tablet Take 0.5 mg by mouth every 8 (eight) hours as needed for anxiety.    Yes [provider]  magnesium oxide (MAG-OX) 400 (240 Mg) MG tablet Take 400 mg by mouth daily.   Yes [provider]  metFORMIN (GLUCOPHAGE-XR) 500 MG 24 hr tablet Take 500 mg by mouth 2 (two) times daily. 12/30/21  Yes [provider]  sacubitril-valsartan (ENTRESTO) 97-103 MG Take 1 tablet by mouth 2 (two) times daily.   Yes [provider]  spironolactone (ALDACTONE) 25 MG tablet Take 25 mg by mouth daily. 11/28/21  Yes [provider]  Vitamin D, Ergocalciferol, (DRISDOL) 50000 units CAPS capsule Take 1 capsule by mouth 3 (three) times a week. 02/03/16  Yes [provider]  omeprazole (PRILOSEC) 20 MG capsule Take 20 mg by mouth daily. Patient not taking: Reported on 02/08/2022 12/20/21   [provider]   Review of Systems  Constitutional:  Positive for fatigue (easily). Negative for appetite change.  HENT:  Positive for hearing loss. Negative for congestion and sore throat.   Eyes: Negative.   Respiratory:  Positive for shortness of breath (easily). Negative for cough.   Cardiovascular:  Positive for chest pain (at times), palpitations and leg swelling.  Gastrointestinal:  Positive for abdominal distention. Negative for abdominal pain.  Endocrine: Negative.   Genitourinary: Negative.   Musculoskeletal:  Positive for arthralgias (joints) and myalgias.  Skin: Negative.   Allergic/Immunologic:  Negative.   Neurological:  Positive for dizziness and light-headedness (constant but worse with position changes).  Hematological:  Negative for adenopathy. Does not bruise/bleed easily.  Psychiatric/Behavioral:  Positive for dysphoric mood. Negative for sleep disturbance (sleeping on 2 pillows with CPAP). The patient is nervous/anxious.    Vitals:   02/08/22 1442  BP: 114/81  Pulse: 86  Resp: 20  SpO2: 100%  Weight: (!) 308 lb 4 oz (139.8 kg)  Height: '6\' 2"'$  (1.88 m)   Wt Readings from Last 3 Encounters:  02/08/22 (!) 308 lb 4 oz (139.8 kg)  01/02/22 (!) 300 lb 3.2 oz (136.2 kg)  06/02/21 (!) 304 lb 1.9 oz (137.9 kg)   Lab Results  Component Value Date   CREATININE 0.84 07/18/2019   CREATININE 1.59 (  H) 07/17/2019   CREATININE 0.73 07/08/2019   Physical Exam Vitals and nursing note reviewed. Exam conducted with a chaperone present (wife).  Constitutional:      Appearance: Normal appearance.  HENT:     Head: Normocephalic and atraumatic.  Cardiovascular:     Rate and Rhythm: Normal rate and regular rhythm.  Pulmonary:     Effort: Pulmonary effort is normal. No respiratory distress.     Breath sounds: No wheezing or rales.  Abdominal:     General: There is distension.     Palpations: Abdomen is soft.  Musculoskeletal:        General: No tenderness.     Cervical back: Normal range of motion and neck supple.     Right lower leg: Edema (trace pitting) present.     Left lower leg: Edema (trace pitting) present.  Skin:    General: Skin is warm and dry.  Neurological:     General: No focal deficit present.     Mental Status: He is alert and oriented to person, place, and time.  Psychiatric:        Mood and Affect: Mood normal.        Behavior: Behavior normal.   Assessment & Plan:   1: Chronic heart failure with reduced ejection fraction- - NYHA class III - fluid overloaded today with abdominal distention and overnight 5 pound weight gain - will stop bumex and begin  torsemide '40mg'$  BID; advised that should his weight start to drastically decline, he's to decrease his torsemide to daily - check BMP next visit - echo has been scheduled for 02/28/22 - reviewed the importance of keeping daily fluid intake to 60 ounces daily - saw [facility] Luana Shu) 02/06/22; he requests cardiology appt with [facility]; this was scheduled with Dr Drema Dallas on 02/24/22 - on GDMT of carvedilol, entresto and spironolactone - consider adding SGLT2 - discussed having furoscix on hand; he will watch video at home and we will discuss further at next visit - BNP 02/06/22 was 4.93  2: HTN- - BP looks good (114/81) although he's been dizzy ever since his entresto was increased; will decrease this to 1/2 tablet BID so he can use up what he has (~ 49/'51mg'$ ) - sees PCP (Kikel) in [place] - BMP 02/06/22 reviewed and showed sodium 135, potassium 4.4, creatinine 0.85 & GFR >90  3: DM- - A1c 12/26/21 was 12.6% - taking glipizide, metformin and ozempic - feels like the ozempic is causing diarrhea; encouraged him to follow-up with endocrinology  4: Tobacco use- - has not smoked since his ED visit - congratulated on this and continued cessation encouraged  5: Obstructive sleep apnea- - saw pulmonology (Ava) 01/02/22 - wearing CPAP 8 hours a night - wearing oxygen at 2L around the clock   Medication bottles reviewed.   Return in 1 week, sooner if needed.

## 2022-02-08 ENCOUNTER — Ambulatory Visit: Payer: BC Managed Care – PPO | Attending: Family | Admitting: Family

## 2022-02-08 ENCOUNTER — Encounter: Payer: Self-pay | Admitting: Family

## 2022-02-08 VITALS — BP 114/81 | HR 86 | Resp 20 | Ht 74.0 in | Wt 308.2 lb

## 2022-02-08 DIAGNOSIS — R110 Nausea: Secondary | ICD-10-CM | POA: Diagnosis not present

## 2022-02-08 DIAGNOSIS — K2100 Gastro-esophageal reflux disease with esophagitis, without bleeding: Secondary | ICD-10-CM | POA: Insufficient documentation

## 2022-02-08 DIAGNOSIS — I5022 Chronic systolic (congestive) heart failure: Secondary | ICD-10-CM | POA: Diagnosis not present

## 2022-02-08 DIAGNOSIS — R140 Abdominal distension (gaseous): Secondary | ICD-10-CM | POA: Diagnosis not present

## 2022-02-08 DIAGNOSIS — I110 Hypertensive heart disease with heart failure: Secondary | ICD-10-CM | POA: Insufficient documentation

## 2022-02-08 DIAGNOSIS — Z794 Long term (current) use of insulin: Secondary | ICD-10-CM

## 2022-02-08 DIAGNOSIS — Z87891 Personal history of nicotine dependence: Secondary | ICD-10-CM | POA: Insufficient documentation

## 2022-02-08 DIAGNOSIS — G4733 Obstructive sleep apnea (adult) (pediatric): Secondary | ICD-10-CM | POA: Diagnosis not present

## 2022-02-08 DIAGNOSIS — I10 Essential (primary) hypertension: Secondary | ICD-10-CM | POA: Diagnosis not present

## 2022-02-08 DIAGNOSIS — Z720 Tobacco use: Secondary | ICD-10-CM

## 2022-02-08 DIAGNOSIS — R197 Diarrhea, unspecified: Secondary | ICD-10-CM | POA: Diagnosis not present

## 2022-02-08 DIAGNOSIS — R42 Dizziness and giddiness: Secondary | ICD-10-CM | POA: Diagnosis not present

## 2022-02-08 DIAGNOSIS — Z9989 Dependence on other enabling machines and devices: Secondary | ICD-10-CM

## 2022-02-08 DIAGNOSIS — E119 Type 2 diabetes mellitus without complications: Secondary | ICD-10-CM | POA: Insufficient documentation

## 2022-02-08 MED ORDER — TORSEMIDE 20 MG PO TABS: 40.0000 mg | ORAL_TABLET | Freq: Two times a day (BID) | ORAL | 3 refills | Status: DC

## 2022-02-08 NOTE — Patient Instructions (Addendum)
Continue weighing daily and call for an overnight weight gain of 3 pounds or more or a weekly weight gain of more than 5 pounds.   If you have voicemail, please make sure your mailbox is cleaned out so that we may leave a message and please make sure to listen to any voicemails.    If you receive a satisfaction survey regarding the [facility], please take the time to fill it out. This way we can continue to provide excellent care and make any changes that need to be made.    Decrease entresto to 49/'51mg'$  twice daily. You can break the '97mg'$  tablets in half and take 1/2 tablet twice a day.    Stop taking your bumex and begin taking torsemide '40mg'$  twice daily. This will be 2 tablets in the morning and 2 tablets in the afternoon.   [facility] [address] Madelaine Bhat [place], [place] [address] [phone] 11:00am Dr. Nehemiah Massed 02/24/22

## 2022-02-14 NOTE — Progress Notes (Unsigned)
Patient ID: [name] Gregory, male    DOB: Jan 09, 1971, 51 y.o.   MRN: [id number]  HPI  [name] Gregory is a 51 y/o male with a history of DM, HTN, GERD, liver hemangioma, sleep apnea, tobacco use and chronic heart failure.   Echo report from 11/18/21 reviewed and showed an EF of 20-25% along with moderate LAE, trivial [name] and borderline pulmonary HTN.   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 Found to be hyperkalemic with potassium of 6.5 along with A1c of 12.6%. Started spironolactone ~ 1 month prior. Given lasix with improvement of potassium level and he was released.    He presents today for a follow-upl visit with a chief complaint of moderate shortness of breath with minimal exertion. Describes this as chronic in nature. He has associated fatigue, intermittent chest pain, pedal edema, palpitations, abdominal distention, dizziness, chronic pain, depression and anxiety along with this. He denies any difficulty sleeping, cough or weight gain.   Weighing daily and wearing compression socks daily. Wearing oxygen at 2L around the clock.   Blood pressure running low at home with SBP sometimes in the 80's. When that occurs, he says that he feels "really bad".   Past Medical History:  Diagnosis Date   CHF (congestive heart failure) (HCC)    Complication of anesthesia    WOKE UP DURING SURGERY   Diabetes (HCC)    GERD (gastroesophageal reflux disease)    Hemangioma of liver    Hypertension    Sleep apnea    URI (upper respiratory infection)    Past Surgical History:  Procedure Laterality Date   BIOPSY  05/04/2016   Procedure: BIOPSY;  Surgeon: Daneil Dolin, MD;  [facility];  Service: Endoscopy;;  gastric bx   COLONOSCOPY WITH  PROPOFOL N/A 05/04/2016    PROPOFOL;  Surgeon: Daneil Dolin, MD; diverticulosis throughout the entire colon, otherwise normal.  Repeat due in 2027.   ESOPHAGOGASTRODUODENOSCOPY (EGD) WITH PROPOFOL N/A 05/04/2016   PROPOFOL;  Surgeon: Daneil Dolin, MD; LA grade B esophagitis, gastritis, small hiatal hernia.   EYE SURGERY     SALIVARY GLAND SURGERY  1992   Family History  Problem Relation Age of Onset   Hypertension Mother    CVA Father    Diabetes Father    Throat cancer Father    Heart disease Brother 24   Cervical cancer Maternal Grandmother    Throat cancer Maternal Grandmother    Throat cancer Maternal Grandfather    Bladder Cancer Maternal Grandfather    Cirrhosis Maternal Grandfather    Colon cancer Neg Hx    Social History   Tobacco Use   Smoking status: Former    Packs/day: 0.25    Years: 36.00    Total pack years: 9.00    Types: Cigarettes    Quit date: 11/18/2021    Years since quitting: 0.2   Smokeless tobacco: Never   Tobacco comments:    QUIT SMOKING IN APRIL  Substance Use Topics   Alcohol use: Yes    Comment: maybe once a month   No Known Allergies  Prior to Admission medications   Medication Sig Start Date End Date Taking? Authorizing Provider  amLODipine (NORVASC) 10  MG tablet Take 10 mg by mouth daily. 03/11/16  Yes [provider]  atorvastatin (LIPITOR) 20 MG tablet Take 20 mg by mouth daily. 11/03/21  Yes [provider]  buPROPion (ZYBAN) 150 MG 12 hr tablet Take 150 mg by mouth 2 (two) times daily.   Yes [provider]  carvedilol (COREG) 25 MG tablet Take 25 mg by mouth 2 (two) times daily with a meal.   Yes [provider]  citalopram (CELEXA) 20 MG tablet Take 1 tablet by mouth daily. 04/24/17  Yes [provider]  gabapentin (NEURONTIN) 300 MG capsule Take 300 mg by mouth 2 (two) times daily. Take 2 capsule  in AM and 1 capsule q PM for nerve pain   Yes [provider]  glipiZIDE (GLUCOTROL)  5 MG tablet Take 5 mg by mouth 2 (two) times daily before a meal.   Yes [provider]  LORazepam (ATIVAN) 0.5 MG tablet Take 0.5 mg by mouth every 8 (eight) hours as needed for anxiety.    Yes [provider]  magnesium oxide (MAG-OX) 400 (240 Mg) MG tablet Take 400 mg by mouth daily.   Yes [provider]  metFORMIN (GLUCOPHAGE) 1000 MG tablet Take 1,000 mg by mouth 2 (two) times daily with a meal.   Yes [provider]  omeprazole (PRILOSEC) 20 MG capsule Take 20 mg by mouth daily. 12/20/21  Yes [provider]  sacubitril-valsartan (ENTRESTO) 97-103 MG Take 0.5 tablets by mouth 2 (two) times daily.   Yes [provider]  Semaglutide,0.25 or 0.'5MG'$ /DOS, (OZEMPIC, 0.25 OR 0.5 MG/DOSE,) 2 MG/1.5ML SOPN Inject 0.25 mg into the skin once a week.   Yes [provider]  spironolactone (ALDACTONE) 25 MG tablet Take 25 mg by mouth daily. 11/28/21  Yes [provider]  torsemide (DEMADEX) 20 MG tablet Take 2 tablets (40 mg total) by mouth 2 (two) times daily. 02/08/22  Yes Saleha Kalp, Otila Kluver A, FNP  Vitamin D, Ergocalciferol, (DRISDOL) 50000 units CAPS capsule Take 1 capsule by mouth 3 (three) times a week. 02/03/16  Yes [provider]    Review of Systems  Constitutional:  Positive for fatigue (easily). Negative for appetite change.  HENT:  Positive for hearing loss. Negative for congestion and sore throat.   Eyes: Negative.   Respiratory:  Positive for shortness of breath (easily). Negative for cough.   Cardiovascular:  Positive for chest pain (at times), palpitations (improving) and leg swelling.  Gastrointestinal:  Positive for abdominal distention and diarrhea. Negative for abdominal pain.  Endocrine: Negative.   Genitourinary: Negative.   Musculoskeletal:  Positive for arthralgias (joints) and myalgias.  Skin: Negative.   Allergic/Immunologic: Negative.   Neurological:  Positive for dizziness and light-headedness  (constant but worse with position changes).  Hematological:  Negative for adenopathy. Does not bruise/bleed easily.  Psychiatric/Behavioral:  Positive for dysphoric mood. Negative for sleep disturbance (sleeping on 2 pillows with CPAP). The patient is nervous/anxious.    Vitals:   02/15/22 1134  BP: 100/73  Pulse: 87  Resp: 16  SpO2: 100%  Weight: (!) 305 lb 4 oz (138.5 kg)  Height: '6\' 2"'$  (1.88 m)   Wt Readings from Last 3 Encounters:  02/15/22 (!) 305 lb 4 oz (138.5 kg)  02/08/22 (!) 308 lb 4 oz (139.8 kg)  01/02/22 (!) 300 lb 3.2 oz (136.2 kg)   Lab Results  Component Value Date   CREATININE 0.84 07/18/2019   CREATININE 1.59 (H) 07/17/2019   CREATININE 0.73  07/08/2019    Physical Exam Vitals and nursing note reviewed. Exam conducted with a chaperone present (wife).  Constitutional:      Appearance: Normal appearance.  HENT:     Head: Normocephalic and atraumatic.  Cardiovascular:     Rate and Rhythm: Normal rate and regular rhythm.  Pulmonary:     Effort: Pulmonary effort is normal. No respiratory distress.     Breath sounds: No wheezing or rales.  Abdominal:     General: There is distension.     Palpations: Abdomen is soft.  Musculoskeletal:        General: No tenderness.     Cervical back: Normal range of motion and neck supple.     Right lower leg: Edema (trace pitting) present.     Left lower leg: Edema (trace pitting) present.  Skin:    General: Skin is warm and dry.  Neurological:     General: No focal deficit present.     Mental Status: He is alert and oriented to person, place, and time.  Psychiatric:        Mood and Affect: Mood normal.        Behavior: Behavior normal.   Assessment & Plan:   1: Chronic heart failure with reduced ejection fraction- - NYHA class III - euvolemic today - weighing daily; reminded to call for an overnight weight gain of > 2 pounds or a weekly weight gain of > 5 pounds - weight down 3 pounds from last visit here 1 week  ago - check BMP today since diuretic changed to torsemide at last visit - echo is scheduled for 02/28/22 - reviewed the importance of keeping daily fluid intake to 60 ounces daily - saw [facility] Luana Shu) 02/06/22; he will be seeing cardiology Nehemiah Massed) on 02/24/22 - on GDMT of carvedilol, entresto and spironolactone - consider adding SGLT2 - they haven't watched furoscix video yet so will hold off on this - will wean off amlodipine and see if edema improves; he will decrease it to 1/2 tablet daily for 1 week then stop it - when he stops the amlodipine, he will go back up on his entresto to the whole 97/'103mg'$  BID - check BMP today since diuretic had been changed to torsemide - BNP 02/06/22 was 4.93 - PharmD reconciled medications  2: HTN- - BP looks good (100/73) but he says that he sometimes gets SBP at home in the 80's; he is to let me know if that continues - adjusting meds per above - sees PCP (Kikel) in [place] - BMP 02/06/22 reviewed and showed sodium 135, potassium 4.4, creatinine 0.85 & GFR >90  3: DM- - A1c 12/26/21 was 12.6% - taking glipizide, metformin and ozempic - will decrease his metformin to '500mg'$  BID as glucose levels are much better controlled and see if that helps his GI symptoms - glucose at home this morning was 131  4: Tobacco use- - has not smoked since May 2023 - congratulated on this and continued cessation encouraged  5: Obstructive sleep apnea- - saw pulmonology (Ava) 01/02/22 - wearing CPAP 8 hours a night - wearing oxygen at 2L around the clock   Medication bottles reviewed.   Return in 2 weeks, sooner if needed

## 2022-02-15 ENCOUNTER — Encounter: Payer: Self-pay | Admitting: Family

## 2022-02-15 ENCOUNTER — Other Ambulatory Visit
Admission: RE | Admit: 2022-02-15 | Discharge: 2022-02-15 | Disposition: A | Discharge status: Home / Self Care | Payer: BC Managed Care – PPO | Source: Ambulatory Visit | Attending: Family | Admitting: Family

## 2022-02-15 ENCOUNTER — Encounter: Payer: Self-pay | Admitting: Pharmacist

## 2022-02-15 ENCOUNTER — Ambulatory Visit (HOSPITAL_BASED_OUTPATIENT_CLINIC_OR_DEPARTMENT_OTHER): Payer: BC Managed Care – PPO | Admitting: Family

## 2022-02-15 VITALS — BP 100/73 | HR 87 | Resp 16 | Ht 74.0 in | Wt 305.2 lb

## 2022-02-15 DIAGNOSIS — I10 Essential (primary) hypertension: Secondary | ICD-10-CM

## 2022-02-15 DIAGNOSIS — Z7984 Long term (current) use of oral hypoglycemic drugs: Secondary | ICD-10-CM | POA: Insufficient documentation

## 2022-02-15 DIAGNOSIS — R0602 Shortness of breath: Secondary | ICD-10-CM | POA: Insufficient documentation

## 2022-02-15 DIAGNOSIS — I5022 Chronic systolic (congestive) heart failure: Secondary | ICD-10-CM | POA: Insufficient documentation

## 2022-02-15 DIAGNOSIS — Z87891 Personal history of nicotine dependence: Secondary | ICD-10-CM | POA: Insufficient documentation

## 2022-02-15 DIAGNOSIS — R002 Palpitations: Secondary | ICD-10-CM | POA: Insufficient documentation

## 2022-02-15 DIAGNOSIS — F32A Depression, unspecified: Secondary | ICD-10-CM | POA: Insufficient documentation

## 2022-02-15 DIAGNOSIS — E119 Type 2 diabetes mellitus without complications: Secondary | ICD-10-CM | POA: Insufficient documentation

## 2022-02-15 DIAGNOSIS — F419 Anxiety disorder, unspecified: Secondary | ICD-10-CM | POA: Insufficient documentation

## 2022-02-15 DIAGNOSIS — Z79899 Other long term (current) drug therapy: Secondary | ICD-10-CM | POA: Insufficient documentation

## 2022-02-15 DIAGNOSIS — R140 Abdominal distension (gaseous): Secondary | ICD-10-CM | POA: Insufficient documentation

## 2022-02-15 DIAGNOSIS — G4733 Obstructive sleep apnea (adult) (pediatric): Secondary | ICD-10-CM | POA: Insufficient documentation

## 2022-02-15 DIAGNOSIS — Z7985 Long-term (current) use of injectable non-insulin antidiabetic drugs: Secondary | ICD-10-CM | POA: Insufficient documentation

## 2022-02-15 DIAGNOSIS — K219 Gastro-esophageal reflux disease without esophagitis: Secondary | ICD-10-CM | POA: Insufficient documentation

## 2022-02-15 DIAGNOSIS — I110 Hypertensive heart disease with heart failure: Secondary | ICD-10-CM | POA: Insufficient documentation

## 2022-02-15 DIAGNOSIS — Z720 Tobacco use: Secondary | ICD-10-CM | POA: Diagnosis not present

## 2022-02-15 DIAGNOSIS — R42 Dizziness and giddiness: Secondary | ICD-10-CM | POA: Insufficient documentation

## 2022-02-15 DIAGNOSIS — Z9989 Dependence on other enabling machines and devices: Secondary | ICD-10-CM

## 2022-02-15 DIAGNOSIS — Z794 Long term (current) use of insulin: Secondary | ICD-10-CM

## 2022-02-15 DIAGNOSIS — G8929 Other chronic pain: Secondary | ICD-10-CM | POA: Insufficient documentation

## 2022-02-15 DIAGNOSIS — R0789 Other chest pain: Secondary | ICD-10-CM | POA: Insufficient documentation

## 2022-02-15 LAB — BASIC METABOLIC PANEL
Anion gap: 11 (ref 5–15)
BUN: 37 mg/dL — ABNORMAL HIGH (ref 6–20)
CO2: 26 mmol/L (ref 22–32)
Calcium: 9.3 mg/dL (ref 8.9–10.3)
Chloride: 97 mmol/L — ABNORMAL LOW (ref 98–111)
Creatinine, Ser: 1.35 mg/dL — ABNORMAL HIGH (ref 0.61–1.24)
GFR, Estimated: >60 mL/min (ref >60)
Glucose, Bld: 154 mg/dL — ABNORMAL HIGH (ref 70–99)
Potassium: 4.7 mmol/L (ref 3.5–5.1)
Sodium: 134 mmol/L — ABNORMAL LOW (ref 135–145)

## 2022-02-15 NOTE — Patient Instructions (Signed)
Continue weighing daily and call for an overnight weight gain of 3 pounds or more or a weekly weight gain of more than 5 pounds.   If you have voicemail, please make sure your mailbox is cleaned out so that we may leave a message and please make sure to listen to any voicemails.     

## 2022-02-16 ENCOUNTER — Telehealth: Payer: Self-pay

## 2022-02-16 NOTE — Progress Notes (Signed)
Patient ID: [name] Gregory, male   DOB: 1971/07/12, 51 y.o.   MRN: [id number] [place] COUNSELING NOTE  Guideline-Directed Medical Therapy/Evidence Based Medicine  ACE/ARB/ARNI: Sacubitril-valsartan 97-103 mg twice daily - taking 1/2 tab BID Beta Blocker: Carvedilol 25 mg twice daily Aldosterone Antagonist: Spironolactone 25 mg daily Diuretic: Torsemide 40 mg BID SGLT2i:  none  Adherence Assessment  Do you ever forget to take your medication? '[]'$ Yes '[x]'$ No  Do you ever skip doses due to side effects? '[]'$ Yes '[x]'$ No  Do you have trouble affording your medicines? '[]'$ Yes '[x]'$ No  Are you ever unable to pick up your medication due to transportation difficulties? '[]'$ Yes '[x]'$ No  Do you ever stop taking your medications because you don't believe they are helping? '[]'$ Yes '[x]'$ No  Do you check your weight daily? '[x]'$ Yes '[]'$ No   Adherence strategy: medication list  Barriers to obtaining medications: none  Vital signs: HR 87, BP 100/73, weight (pounds) 305 ECHO: Date 11/18/21, EF 20-25%,moderate LAE, trivial MR and borderline pulmonary HTN.      Latest Ref Rng & Units 02/15/2022   12:44 PM 07/18/2019    4:30 AM 07/17/2019   10:27 AM  BMP  Glucose 70 - 99 mg/dL 154  145  370   BUN 6 - 20 mg/dL 37  11  14   Creatinine 0.61 - 1.24 mg/dL 1.35  0.84  1.59   Sodium 135 - 145 mmol/L 134  131  128   Potassium 3.5 - 5.1 mmol/L 4.7  3.9  4.4   Chloride 98 - 111 mmol/L 97  98  92   CO2 22 - 32 mmol/L '26  24  23   '$ Calcium 8.9 - 10.3 mg/dL 9.3  8.1  8.9     Past Medical History:  Diagnosis Date   CHF (congestive heart failure) (HCC)    Complication of anesthesia    WOKE UP DURING SURGERY   Diabetes (HCC)    GERD (gastroesophageal reflux disease)    Hemangioma of liver    Hypertension    Sleep apnea    URI (upper respiratory infection)     ASSESSMENT 51 year old male who presents to the HF clinic for follow up. PMH includes DM, HTN, GERD< liver  hemangioma, sleep apnea, hx of tobacco use, and chronic heart failure.  Mr Alvira's reports symptomatic hypotension multiple times per week, denies falls, still experiencing chest pain with excerption, and reports sulfuric taste while using Ozempic.  Ozempic can enhance and alter taste. Patient is taking 1000 mg of metformin as well.  PLAN  Cut amlodipine dose from '10mg'$  to '5mg'$  for 1 week, then okay to stop Resume Entresto 97/103 BID (full tablet) once able to d/c amlodipine Decrease metformin to '500mg'$  BID with meals (average weekly BG 177) Can consider other GLP1 alternatives like Trulicity, or Victoza if problems with taste persist. Will consider adding SGLT2i once all other medication and BP stable   Time spent: 20 minutes  Marvella Jenning Rodriguez-Guzman PharmD, BCPS 02/16/2022 3:02 PM   Current Outpatient Medications:    amLODipine (NORVASC) 10 MG tablet, Take 5 mg by mouth daily., Disp: , Rfl:    atorvastatin (LIPITOR) 20 MG tablet, Take 20 mg by mouth daily., Disp: , Rfl:    buPROPion (ZYBAN) 150 MG 12 hr tablet, Take 150 mg by mouth 2 (two) times daily., Disp: , Rfl:    carvedilol (COREG) 25 MG tablet, Take 25 mg by mouth 2 (two) times daily with a  meal., Disp: , Rfl:    citalopram (CELEXA) 20 MG tablet, Take 1 tablet by mouth daily., Disp: , Rfl:    gabapentin (NEURONTIN) 300 MG capsule, Take 300 mg by mouth 2 (two) times daily. Take 2 capsule  in AM and 1 capsule q PM for nerve pain, Disp: , Rfl:    glipiZIDE (GLUCOTROL) 5 MG tablet, Take 5 mg by mouth 2 (two) times daily before a meal., Disp: , Rfl:    LORazepam (ATIVAN) 0.5 MG tablet, Take 0.5 mg by mouth every 8 (eight) hours as needed for anxiety. , Disp: , Rfl:    magnesium oxide (MAG-OX) 400 (240 Mg) MG tablet, Take 400 mg by mouth daily., Disp: , Rfl:    metFORMIN (GLUCOPHAGE) 1000 MG tablet, Take 500 mg by mouth 2 (two) times daily with a meal., Disp: , Rfl:    omeprazole (PRILOSEC) 20 MG capsule, Take 20 mg by mouth daily.,  Disp: , Rfl:    sacubitril-valsartan (ENTRESTO) 97-103 MG, Take 0.5 tablets by mouth 2 (two) times daily., Disp: , Rfl:    Semaglutide,0.25 or 0.'5MG'$ /DOS, (OZEMPIC, 0.25 OR 0.5 MG/DOSE,) 2 MG/1.5ML SOPN, Inject 0.25 mg into the skin once a week., Disp: , Rfl:    spironolactone (ALDACTONE) 25 MG tablet, Take 25 mg by mouth daily., Disp: , Rfl:    torsemide (DEMADEX) 20 MG tablet, Take 2 tablets (40 mg total) by mouth 2 (two) times daily., Disp: 120 tablet, Rfl: 3   Vitamin D, Ergocalciferol, (DRISDOL) 50000 units CAPS capsule, Take 1 capsule by mouth 3 (three) times a week., Disp: , Rfl:

## 2022-02-16 NOTE — Telephone Encounter (Addendum)
Called patient with results and message below. Patient verbalized understanding and had no questions or concerns at this time. Instructed patient to call if any questions or symptoms arise in the next couple of weeks before his next appointment.  Georg Ruddle, RN ----- Message from Alisa Graff, [place] sent at 02/15/2022  3:49 PM EDT ----- Potassium level is normal. Kidney function is a little worse but this could be because your blood pressure has been so low. Continue with medication changes discussed at office visit. We will recheck your labs in 2 weeks at your next appointment.

## 2022-02-28 ENCOUNTER — Ambulatory Visit: Admission: RE | Admit: 2022-02-28 | Payer: BC Managed Care – PPO | Source: Ambulatory Visit

## 2022-02-28 NOTE — Progress Notes (Unsigned)
Patient ID: [name] Gregory, male    DOB: 1970-10-15, 51 y.o.   MRN: [id number]  HPI  [name] Gregory is a 51 y/o male with a history of DM, HTN, GERD, liver hemangioma, sleep apnea, tobacco use and chronic heart failure.   Echo report from 11/18/21 reviewed and showed an EF of 20-25% along with moderate LAE, trivial [name] and borderline pulmonary HTN.   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 Found to be hyperkalemic with potassium of 6.5 along with A1c of 12.6%. Started spironolactone ~ 1 month prior. Given lasix with improvement of potassium level and he was released.    He presents today for a follow-upl visit with a chief complaint of moderate shortness of breath with minimal exertion. Describes this as chronic in nature. He has associated fatigue, chest tightness/pressure, pedal edema, palpitations, abdominal distention, dizziness, joint pain and fluctuating weight along with this. He denies any difficulty sleeping (once he falls asleep) or cough.   Did decrease his amlodipine to '5mg'$  daily but has continued to take it. Stayed on entresto 49/'51mg'$  BID. Continues to get home BP readings at times in the upper 80's/ 50's.   Reports severe belching that produces an awful smell that is most likely related to his ozempic use.   Saw [facility] cardiology on 02/24/22 but did not have a good experience and asks that we schedule him an appointment with [place].   Past Medical History:  Diagnosis Date   CHF (congestive heart failure) (HCC)    Complication of anesthesia    WOKE UP DURING SURGERY   Diabetes (HCC)    GERD (gastroesophageal reflux disease)    Hemangioma of liver    Hypertension    Sleep apnea    URI (upper respiratory infection)     Past Surgical History:  Procedure Laterality Date   BIOPSY  05/04/2016   Procedure: BIOPSY;  Surgeon: Daneil Dolin, MD;  [facility];  Service: Endoscopy;;  gastric bx   COLONOSCOPY WITH PROPOFOL N/A 05/04/2016    PROPOFOL;  Surgeon: Daneil Dolin, MD; diverticulosis throughout the entire colon, otherwise normal.  Repeat due in 2027.   ESOPHAGOGASTRODUODENOSCOPY (EGD) WITH PROPOFOL N/A 05/04/2016   PROPOFOL;  Surgeon: Daneil Dolin, MD; LA grade B esophagitis, gastritis, small hiatal hernia.   EYE SURGERY     SALIVARY GLAND SURGERY  1992   Family History  Problem Relation Age of Onset   Hypertension Mother    CVA Father    Diabetes Father    Throat cancer Father    Heart disease Brother 63   Cervical cancer Maternal Grandmother    Throat cancer Maternal Grandmother    Throat cancer Maternal Grandfather    Bladder Cancer Maternal Grandfather    Cirrhosis Maternal Grandfather    Colon cancer Neg Hx    Social History   Tobacco Use   Smoking status: Former    Packs/day: 0.25    Years: 36.00    Total pack years: 9.00    Types: Cigarettes    Quit date: 11/18/2021    Years since quitting: 0.2   Smokeless tobacco: Never   Tobacco comments:    QUIT SMOKING IN APRIL  Substance Use Topics  Alcohol use: Yes    Comment: maybe once a month   No Known Allergies  Prior to Admission medications   Medication Sig Start Date End Date Taking? Authorizing Provider  amLODipine (NORVASC) 10 MG tablet Take 5 mg by mouth daily. 03/11/16  Yes [provider]  atorvastatin (LIPITOR) 20 MG tablet Take 20 mg by mouth daily. 11/03/21  Yes [provider]  buPROPion (ZYBAN) 150 MG 12 hr tablet Take 150 mg by mouth 2 (two) times daily.   Yes [provider]  carvedilol (COREG) 25 MG tablet Take 25 mg by mouth 2 (two) times daily with a meal.   Yes [provider]  citalopram (CELEXA) 20 MG tablet Take 1 tablet by mouth daily. 04/24/17  Yes [provider]  gabapentin (NEURONTIN) 300 MG capsule Take 300 mg by mouth 2 (two) times daily. Take 2 capsule  in AM and 1 capsule q PM for nerve pain   Yes [provider]  glipiZIDE (GLUCOTROL) 5 MG tablet Take 5 mg by mouth 2 (two) times daily before a meal.   Yes [provider]  LORazepam (ATIVAN) 0.5 MG tablet Take 0.5 mg by mouth every 8 (eight) hours as needed for anxiety.    Yes [provider]  magnesium oxide (MAG-OX) 400 (240 Mg) MG tablet Take 400 mg by mouth daily.   Yes [provider]  metFORMIN (GLUCOPHAGE) 1000 MG tablet Take 500 mg by mouth 2 (two) times daily with a meal.   Yes [provider]  omeprazole (PRILOSEC) 20 MG capsule Take 20 mg by mouth daily. 12/20/21  Yes [provider]  sacubitril-valsartan (ENTRESTO) 49-51 MG Take 1 tablet by mouth 2 (two) times daily.   Yes [provider]  Semaglutide,0.25 or 0.'5MG'$ /DOS, (OZEMPIC, 0.25 OR 0.5 MG/DOSE,) 2 MG/1.5ML SOPN Inject 0.25 mg into the skin once a week.   Yes [provider]  spironolactone (ALDACTONE) 25 MG tablet Take 25 mg by mouth daily. 11/28/21  Yes [provider]  torsemide (DEMADEX) 20 MG tablet Take 2 tablets (40 mg total) by mouth 2 (two) times daily. 02/08/22  Yes Phyillis Dascoli, Otila Kluver A, FNP  Vitamin D, Ergocalciferol, (DRISDOL) 50000 units CAPS capsule Take 1 capsule by mouth 3 (three) times a week. Patient not taking: Reported on 03/01/2022 02/03/16   [provider]   Review of Systems  Constitutional:  Positive for fatigue (easily). Negative for appetite change.  HENT:  Positive for hearing loss. Negative for congestion and sore throat.   Eyes: Negative.   Respiratory:  Positive for chest tightness and shortness of breath (easily). Negative for cough.   Cardiovascular:  Positive for chest pain (at times), palpitations (improving) and leg swelling.  Gastrointestinal:  Positive for abdominal distention and diarrhea. Negative for  abdominal pain.  Endocrine: Negative.   Genitourinary: Negative.   Musculoskeletal:  Positive for arthralgias (joints) and myalgias.  Skin: Negative.   Allergic/Immunologic: Negative.   Neurological:  Positive for dizziness and light-headedness (constant but worse with position changes).  Hematological:  Negative for adenopathy. Does not bruise/bleed easily.  Psychiatric/Behavioral:  Negative for dysphoric mood and sleep disturbance (sleeping on 2 pillows with CPAP). The patient is not nervous/anxious.    Vitals:   03/01/22 1136  BP: 99/79  Pulse: 80  Resp: 16  SpO2: 100%  Weight: (!) 308 lb (139.7 kg)  Height: '6\' 2"'$  (1.88 m)   Wt Readings from Last 3 Encounters:  03/01/22 (!) 308 lb (139.7 kg)  02/15/22 (!) 305 lb 4 oz (138.5 kg)  02/08/22 (!) 308 lb 4 oz (139.8 kg)   Lab Results  Component Value Date   CREATININE 1.35 (H) 02/15/2022   CREATININE 0.84 07/18/2019   CREATININE 1.59 (H) 07/17/2019   Physical Exam Vitals and nursing note reviewed. Exam conducted with a chaperone present (wife).  Constitutional:      Appearance: Normal appearance.  HENT:     Head: Normocephalic and atraumatic.  Cardiovascular:     Rate and Rhythm: Normal rate and regular rhythm.  Pulmonary:     Effort: Pulmonary effort is normal. No respiratory distress.     Breath sounds: No wheezing or rales.  Abdominal:     General: There is no distension.     Palpations: Abdomen is soft.  Musculoskeletal:        General: No tenderness.     Cervical back: Normal range of motion and neck supple.     Right lower leg: Edema (trace pitting) present.     Left lower leg: Edema (trace pitting) present.  Skin:    General: Skin is warm and dry.  Neurological:     General: No focal deficit present.     Mental Status: He is alert and oriented to person, place, and time.  Psychiatric:        Mood and Affect: Mood normal.        Behavior: Behavior normal.   Assessment & Plan:   1: Chronic heart failure  with reduced ejection fraction- - NYHA class III - euvolemic today - weighing daily & home weight chart reviewed; reminded to call for an overnight weight gain of > 2 pounds or a weekly weight gain of > 5 pounds - weight up 3 pounds from last visit here 2 weeks ago - reviewed the importance of keeping daily fluid intake to 60 ounces daily - saw [facility] Luana Shu) 02/06/22; saw cardiology Nehemiah Massed) on 02/24/22 but did not have a good experience and asks that we schedule him an appt with [facility]; this was scheduled for 03/03/22 - on GDMT of carvedilol, entresto and spironolactone - consider adding SGLT2 but BP currently too low - has echo scheduled for 03/06/22 - BNP 02/06/22 was 4.93  2: HTN- - BP low (99/79) with c/o dizziness; he decreased amlodipine to '5mg'$  but didn't stop it; will stop it today - may need to decrease entresto, torsemide or spironolactone if BP continues to be low - sees PCP (Kikel) in [place] - BMP 02/15/22 reviewed and showed sodium 134, potassium 4.7, creatinine 1.35 & GFR >60 - will recheck BMP today to see if creatinine improved  3: DM- - A1c 12/26/21 was 12.6% - taking glipizide, metformin and ozempic - metformin decreased to '500mg'$  BID at last visit - glucose at home this morning was 156  4: Tobacco use- - has not smoked since April 2023 - congratulated on this and continued cessation encouraged  5: Obstructive sleep apnea- - saw pulmonology (Ava) 01/02/22 - wearing CPAP 8 hours a night - wearing oxygen at 2L around the clock   Medication bottles reviewed.   Return next week for a recheck before he goes to [facility].

## 2022-03-01 ENCOUNTER — Ambulatory Visit (HOSPITAL_BASED_OUTPATIENT_CLINIC_OR_DEPARTMENT_OTHER): Payer: BC Managed Care – PPO | Admitting: Family

## 2022-03-01 ENCOUNTER — Other Ambulatory Visit
Admission: RE | Admit: 2022-03-01 | Discharge: 2022-03-01 | Disposition: A | Discharge status: Home / Self Care | Payer: BC Managed Care – PPO | Source: Ambulatory Visit | Attending: Family | Admitting: Family

## 2022-03-01 ENCOUNTER — Encounter: Payer: Self-pay | Admitting: Family

## 2022-03-01 VITALS — BP 99/79 | HR 80 | Resp 16 | Ht 74.0 in | Wt 308.0 lb

## 2022-03-01 DIAGNOSIS — I5022 Chronic systolic (congestive) heart failure: Secondary | ICD-10-CM

## 2022-03-01 DIAGNOSIS — E119 Type 2 diabetes mellitus without complications: Secondary | ICD-10-CM

## 2022-03-01 DIAGNOSIS — G4733 Obstructive sleep apnea (adult) (pediatric): Secondary | ICD-10-CM

## 2022-03-01 DIAGNOSIS — I10 Essential (primary) hypertension: Secondary | ICD-10-CM | POA: Diagnosis not present

## 2022-03-01 DIAGNOSIS — Z9989 Dependence on other enabling machines and devices: Secondary | ICD-10-CM

## 2022-03-01 DIAGNOSIS — Z794 Long term (current) use of insulin: Secondary | ICD-10-CM

## 2022-03-01 DIAGNOSIS — Z720 Tobacco use: Secondary | ICD-10-CM

## 2022-03-01 LAB — BASIC METABOLIC PANEL
Anion gap: 5 (ref 5–15)
BUN: 34 mg/dL — ABNORMAL HIGH (ref 6–20)
CO2: 27 mmol/L (ref 22–32)
Calcium: 8.9 mg/dL (ref 8.9–10.3)
Chloride: 103 mmol/L (ref 98–111)
Creatinine, Ser: 1.07 mg/dL (ref 0.61–1.24)
GFR, Estimated: >60 mL/min (ref >60)
Glucose, Bld: 201 mg/dL — ABNORMAL HIGH (ref 70–99)
Potassium: 4.6 mmol/L (ref 3.5–5.1)
Sodium: 135 mmol/L (ref 135–145)

## 2022-03-01 NOTE — Patient Instructions (Addendum)
Continue weighing daily and call for an overnight weight gain of 3 pounds or more or a weekly weight gain of more than 5 pounds.   If you have voicemail, please make sure your mailbox is cleaned out so that we may leave a message and please make sure to listen to any voicemails.    Do not take anymore amlodipine

## 2022-03-03 ENCOUNTER — Ambulatory Visit: Payer: BC Managed Care – PPO | Admitting: Cardiology

## 2022-03-03 ENCOUNTER — Encounter: Payer: Self-pay | Admitting: Cardiology

## 2022-03-03 VITALS — BP 100/80 | HR 88 | Ht 74.0 in | Wt 306.2 lb

## 2022-03-03 DIAGNOSIS — I428 Other cardiomyopathies: Secondary | ICD-10-CM | POA: Diagnosis not present

## 2022-03-03 DIAGNOSIS — I10 Essential (primary) hypertension: Secondary | ICD-10-CM

## 2022-03-03 DIAGNOSIS — E7800 Pure hypercholesterolemia, unspecified: Secondary | ICD-10-CM

## 2022-03-03 NOTE — Patient Instructions (Signed)
Medication Instructions:   Your physician recommends that you continue on your current medications as directed. Please refer to the Current Medication list given to you today.  *If you need a refill on your cardiac medications before your next appointment, please call your pharmacy*     Follow-Up: At [facility], you and your health needs are our priority.  As part of our continuing mission to provide you with exceptional heart care, we have created designated Provider Care Teams.  These Care Teams include your primary Cardiologist (physician) and Advanced Practice Providers (APPs -  Physician Assistants and Nurse Practitioners) who all work together to provide you with the care you need, when you need it.  We recommend signing up for the patient portal called "MyChart".  Sign up information is provided on this After Visit Summary.  MyChart is used to connect with patients for Virtual Visits (Telemedicine).  Patients are able to view lab/test results, encounter notes, upcoming appointments, etc.  Non-urgent messages can be sent to your provider as well.   To learn more about what you can do with MyChart, go to [email or web address].    Your next appointment:   6 week(s)  The format for your next appointment:   In Person  Provider:   Kate Sable, MD    Other Instructions   Important Information About Sugar

## 2022-03-03 NOTE — Progress Notes (Signed)
Cardiology Office Note:    Date:  03/03/2022   ID:  PRAISE DOLECKI, DOB 1971-03-12, MRN [id number]  PCP:  Vidal Schwalbe, [place] Providers Cardiologist:  None     Referring MD: Vidal Schwalbe, MD   Chief Complaint  Patient presents with   New Patient (Initial Visit)    Ref by Darylene Price for CHF. Patient c/o bilateral leg pain with walking, numbness in feet, fluttering in chest, chest pressure, cramping in hands and legs, numbness/tingling in left arm, abdominal swelling, bilateral LE edema and shortness of breath. Medications reviewed by the patient's bottles.    ABDIRAHIM FLAVELL is a 51 y.o. male who is being seen today for the evaluation of CHF at the request of Vidal Schwalbe, MD.   History of Present Illness:    TYQWAN PINK is a 50 y.o. male with a hx of NICM, last EF 20 to 25%, hypertension, diabetes, OSA/CPAP, former smoker who presents to establish care due to cardiomyopathy.  Previously seen at [facility] from a cardiac perspective.  Last documented notes in the EMR indicates patient was seen last month at [facility] 02/06/2022.  Outside records/reports from [facility] Echo 11/18/2020 EF 20 to 25% Left heart cath 11/18/2021 no evidence for CAD  Entresto was increased to 97-103 mg, developed lightheadedness, causing Entresto to be reduced to 47-51 mg.  Also takes Coreg 25 mg twice daily, Aldactone 25 mg daily.  Has been taking Coreg, Aldactone, Entresto for at least 4 months now.  His brother has a history of CHF diagnosed age 88, died at age 35.  States his father has leg swelling, unsure if he has heart failure.  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  Currently has some hearing impairment.  Past Medical History:  Diagnosis Date   CHF (congestive heart failure) (HCC)    Complication of anesthesia    WOKE UP DURING SURGERY   Diabetes (HCC)    GERD (gastroesophageal reflux disease)    Hemangioma of liver    Hypertension    Sleep apnea    URI (upper respiratory  infection)     Past Surgical History:  Procedure Laterality Date   BIOPSY  05/04/2016   Procedure: BIOPSY;  Surgeon: Daneil Dolin, MD;  [facility];  Service: Endoscopy;;  gastric bx   COLONOSCOPY WITH PROPOFOL N/A 05/04/2016    PROPOFOL;  Surgeon: Daneil Dolin, MD; diverticulosis throughout the entire colon, otherwise normal.  Repeat due in 2027.   ESOPHAGOGASTRODUODENOSCOPY (EGD) WITH PROPOFOL N/A 05/04/2016   PROPOFOL;  Surgeon: Daneil Dolin, MD; LA grade B esophagitis, gastritis, small hiatal hernia.   EYE SURGERY     SALIVARY GLAND SURGERY  1992    Current Medications: Current Meds  Medication Sig   atorvastatin (LIPITOR) 20 MG tablet Take 20 mg by mouth daily.   buPROPion (ZYBAN) 150 MG 12 hr tablet Take 150 mg by mouth 2 (two) times daily.   carvedilol (COREG) 25 MG tablet Take 25 mg by mouth 2 (two) times daily with a meal.   citalopram (CELEXA) 20 MG tablet Take 1 tablet by mouth daily.   gabapentin (NEURONTIN) 300 MG capsule Take 300 mg by mouth 2 (two) times daily. Take 2 capsule  in AM and 1 capsule q PM for nerve pain   glipiZIDE (GLUCOTROL) 5 MG tablet Take 5 mg by mouth 2 (two) times daily before a meal.   LORazepam (ATIVAN) 0.5 MG tablet Take 0.5 mg by  mouth every 8 (eight) hours as needed for anxiety.    magnesium oxide (MAG-OX) 400 (240 Mg) MG tablet Take 400 mg by mouth daily.   metFORMIN (GLUCOPHAGE) 1000 MG tablet Take 500 mg by mouth 2 (two) times daily with a meal.   omeprazole (PRILOSEC) 20 MG capsule Take 20 mg by mouth daily.   sacubitril-valsartan (ENTRESTO) 49-51 MG Take 1 tablet by mouth 2 (two) times daily.   Semaglutide,0.25 or 0.'5MG'$ /DOS, (OZEMPIC, 0.25 OR 0.5 MG/DOSE,) 2 MG/1.5ML SOPN Inject 0.25 mg into the skin once a week.   spironolactone (ALDACTONE) 25 MG tablet Take 25 mg by mouth daily.   torsemide (DEMADEX) 20 MG tablet Take 2 tablets (40 mg total) by mouth 2 (two) times daily.   Vitamin D, Ergocalciferol, (DRISDOL) 50000 units  CAPS capsule Take 1 capsule by mouth 3 (three) times a week.     Allergies:   Patient has no known allergies.   Social History   Socioeconomic History   Marital status: Married    Spouse name: Not on file   Number of children: Not on file   Years of education: Not on file   Highest education level: Not on file  Occupational History   Occupation: administrative for [employer]  Tobacco Use   Smoking status: Former    Packs/day: 0.25    Years: 36.00    Total pack years: 9.00    Types: Cigarettes    Quit date: 11/18/2021    Years since quitting: 0.2   Smokeless tobacco: Never   Tobacco comments:    QUIT SMOKING IN APRIL  Vaping Use   Vaping Use: Every day  Substance and Sexual Activity   Alcohol use: Yes    Comment: maybe once a month   Drug use: No   Sexual activity: Not on file  Other Topics Concern   Not on file  Social History Narrative   Not on file   Social Determinants of Health   Financial Resource Strain: Unknown (07/17/2019)   Overall Financial Resource Strain (CARDIA)    Difficulty of Paying Living Expenses: Patient refused  Food Insecurity: Unknown (07/17/2019)   Hunger Vital Sign    Worried About [place] in the Last Year: Patient refused    [place] in the Last Year: Patient refused  Transportation Needs: Unknown (07/17/2019)   [place] - Hydrologist (Medical): Patient refused    Lack of Transportation (Non-Medical): Patient refused  Physical Activity: Unknown (07/17/2019)   Exercise Vital Sign    Days of Exercise per Week: Patient refused    Minutes of Exercise per Session: Patient refused  Stress: Not on file  Social Connections: Unknown (07/17/2019)   Social Connection and Isolation Panel [NHANES]    Frequency of Communication with Friends and Family: Patient refused    Frequency of Social Gatherings with Friends and Family: Patient refused    Attends Religious Services: Patient refused    Corporate treasurer or Organizations: Patient refused    Attends Music therapist: Patient refused    Marital Status: Patient refused     Family History: The patient's family history includes Bladder Cancer in his maternal grandfather; CVA in his father; Cervical cancer in his maternal grandmother; Cirrhosis in his maternal grandfather; Diabetes in his father; Heart disease (age of onset: 20) in his brother; Hypertension in his mother; Throat cancer in his father, maternal grandfather, and maternal grandmother. There is no history  of Colon cancer.  ROS:   Please see the history of present illness.     All other systems reviewed and are negative.  EKGs/Labs/Other Studies Reviewed:    The following studies were reviewed today:   EKG:  EKG is  ordered today.  The ekg ordered today demonstrates normal sinus rhythm, heart rate 88  Recent Labs: 03/01/2022: BUN 34; Creatinine, Ser 1.07; Potassium 4.6; Sodium 135  Recent Lipid Panel    Component Value Date/Time   CHOL  12/16/2008 0340    185        ATP III CLASSIFICATION:  <200     mg/dL   Desirable  200-239  mg/dL   Borderline High  >=240    mg/dL   High          TRIG 203 (H) 12/16/2008 0340   HDL 37 (L) 12/16/2008 0340   CHOLHDL 5.0 12/16/2008 0340   VLDL 41 (H) 12/16/2008 0340   LDLCALC (H) 12/16/2008 0340    107        Total Cholesterol/HDL:CHD Risk Coronary Heart Disease Risk Table                     Men   Women  1/2 Average Risk   3.4   3.3  Average Risk       5.0   4.4  2 X Average Risk   9.6   7.1  3 X Average Risk  23.4   11.0        Use the calculated Patient Ratio above and the CHD Risk Table to determine the patient's CHD Risk.        ATP III CLASSIFICATION (LDL):  <100     mg/dL   Optimal  100-129  mg/dL   Near or Above                    Optimal  130-159  mg/dL   Borderline  160-189  mg/dL   High  >190     mg/dL   Very High     Risk Assessment/Calculations:          Physical Exam:    VS:  BP  100/80 (BP Location: Right Arm, Patient Position: Sitting, Cuff Size: Large)   Pulse 88   Ht '6\' 2"'$  (1.88 m)   Wt (!) 306 lb 4 oz (138.9 kg)   SpO2 98% Comment: 2 Liers of oxygen  BMI 39.32 kg/m     Wt Readings from Last 3 Encounters:  03/03/22 (!) 306 lb 4 oz (138.9 kg)  03/01/22 (!) 308 lb (139.7 kg)  02/15/22 (!) 305 lb 4 oz (138.5 kg)     GEN:  Well nourished, well developed in no acute distress HEENT: Normal NECK: No JVD; No carotid bruits LYMPHATICS: No lymphadenopathy CARDIAC: RRR, no murmurs, rubs, gallops RESPIRATORY:  Clear to auscultation without rales, wheezing or rhonchi  ABDOMEN: Soft, non-tender, non-distended MUSCULOSKELETAL:  No edema; No deformity  SKIN: Warm and dry NEUROLOGIC:  Alert and oriented x 3 PSYCHIATRIC:  Normal affect   ASSESSMENT:    1. NICM (nonischemic cardiomyopathy) ([place])   2. Primary hypertension   3. Pure hypercholesterolemia    PLAN:    In order of problems listed above:  Nonischemic cardiomyopathy, etiology possibly hereditary due to brother having CHF, dying age 84, father has leg edema possible heart failure.  Repeat echocardiogram scheduled in 4 days.  Has been on GDMT x4 months.  Continue Coreg 25  twice daily, Entresto 49-51 twice daily, Aldactone 25 daily, torsemide 40 twice daily.  If EF stays below 35%, plan referral to EP for ICD consideration.  We will plan to obtain CMR if EF stays low at follow-up visit.  Consider Farxiga at follow-up visit. Hypertension, BP controlled.  Continue Coreg, Entresto, Aldactone. Hyperlipidemia, continue Lipitor.  Follow-up in 6 weeks.        Medication Adjustments/Labs and Tests Ordered: Current medicines are reviewed at length with the patient today.  Concerns regarding medicines are outlined above.  Orders Placed This Encounter  Procedures   EKG 12-Lead   No orders of the defined types were placed in this encounter.   Patient Instructions  Medication Instructions:   Your physician  recommends that you continue on your current medications as directed. Please refer to the Current Medication list given to you today.  *If you need a refill on your cardiac medications before your next appointment, please call your pharmacy*     Follow-Up: At [facility], you and your health needs are our priority.  As part of our continuing mission to provide you with exceptional heart care, we have created designated Provider Care Teams.  These Care Teams include your primary Cardiologist (physician) and Advanced Practice Providers (APPs -  Physician Assistants and Nurse Practitioners) who all work together to provide you with the care you need, when you need it.  We recommend signing up for the patient portal called "MyChart".  Sign up information is provided on this After Visit Summary.  MyChart is used to connect with patients for Virtual Visits (Telemedicine).  Patients are able to view lab/test results, encounter notes, upcoming appointments, etc.  Non-urgent messages can be sent to your provider as well.   To learn more about what you can do with MyChart, go to [email or web address].    Your next appointment:   6 week(s)  The format for your next appointment:   In Person  Provider:   Kate Sable, MD    Other Instructions   Important Information About Sugar         Signed, Kate Sable, MD  03/03/2022 4:08 PM    [place]

## 2022-03-06 ENCOUNTER — Ambulatory Visit
Admission: RE | Admit: 2022-03-06 | Discharge: 2022-03-06 | Disposition: A | Discharge status: Home / Self Care | Payer: BC Managed Care – PPO | Source: Ambulatory Visit | Attending: Family | Admitting: Family

## 2022-03-06 DIAGNOSIS — G4730 Sleep apnea, unspecified: Secondary | ICD-10-CM | POA: Insufficient documentation

## 2022-03-06 DIAGNOSIS — I110 Hypertensive heart disease with heart failure: Secondary | ICD-10-CM | POA: Insufficient documentation

## 2022-03-06 DIAGNOSIS — E119 Type 2 diabetes mellitus without complications: Secondary | ICD-10-CM | POA: Diagnosis not present

## 2022-03-06 DIAGNOSIS — I5022 Chronic systolic (congestive) heart failure: Secondary | ICD-10-CM | POA: Diagnosis not present

## 2022-03-06 DIAGNOSIS — Z87891 Personal history of nicotine dependence: Secondary | ICD-10-CM | POA: Insufficient documentation

## 2022-03-06 LAB — ECHOCARDIOGRAM COMPLETE
AR max vel: 3.05 cm2
AV Area VTI: 3.51 cm2
AV Area mean vel: 2.68 cm2
AV Mean grad: 6 mmHg
AV Peak grad: 9 mmHg
Ao pk vel: 1.5 m/s
Area-P 1/2: 4.46 cm2
S' Lateral: 3.45 cm

## 2022-03-09 ENCOUNTER — Ambulatory Visit: Payer: BC Managed Care – PPO | Admitting: Family

## 2022-03-21 ENCOUNTER — Encounter: Payer: BC Managed Care – PPO | Admitting: Nurse Practitioner

## 2022-03-21 NOTE — Patient Instructions (Signed)

## 2022-03-22 NOTE — Progress Notes (Signed)
Erroneous encounter

## 2022-04-05 NOTE — Progress Notes (Unsigned)
Patient ID: [name] Gregory, male    DOB: Sep 06, 1970, 51 y.o.   MRN: [id number]  HPI  [name] Gregory is a 51 y/o male with a history of DM, HTN, GERD, liver hemangioma, sleep apnea, tobacco use and chronic heart failure.   Echo report from 03/20/22 reviewed and showed an EF of 55%. Echo report from 03/06/22 reviewed and showed an EF of 50-55% along with mild LVH/ LAE and trivial [name]. Echo report from 11/18/21 reviewed and showed an EF of 20-25% along with moderate LAE, trivial [name] and borderline pulmonary HTN.   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 Found to be hyperkalemic with potassium of 6.5 along with A1c of 12.6%. Started spironolactone ~ 1 month prior. Given lasix with improvement of potassium level and he was released.    He presents today for a follow-upl visit with a chief complaint of moderate shortness of breath with minimal exertion. Describes this as chronic in nature. He has associated fatigue, chest tightness, pedal edema, palpitations, abdominal distention, dizziness, joint pain and fluctuating weight along with this. He denies any difficulty sleeping, chest pain or cough.   He says that he's been out of his carvedilol for 2 days because he forgot to get it refilled in time. He is picking it up today.   Home weight has been running from 306-307 pounds. Jardiance was started on 03/20/22.  Did go one final time to the [facility] Clinic at [facility] and had another echo as they wanted to do one themselves. He is planning on continuing with [facility] from this point. He would like endocrinology referral placed because he's trying to get all his care here locally.   Past Medical History:  Diagnosis Date   CHF (congestive heart failure) (HCC)     Complication of anesthesia    WOKE UP DURING SURGERY   Diabetes (HCC)    GERD (gastroesophageal reflux disease)    Hemangioma of liver    Hypertension    Sleep apnea    URI (upper respiratory infection)    Past Surgical History:  Procedure Laterality Date   BIOPSY  05/04/2016   Procedure: BIOPSY;  Surgeon: Daneil Dolin, MD;  [facility];  Service: Endoscopy;;  gastric bx   COLONOSCOPY WITH PROPOFOL N/A 05/04/2016    PROPOFOL;  Surgeon: Daneil Dolin, MD; diverticulosis throughout the entire colon, otherwise normal.  Repeat due in 2027.   ESOPHAGOGASTRODUODENOSCOPY (EGD) WITH PROPOFOL N/A 05/04/2016   PROPOFOL;  Surgeon: Daneil Dolin, MD; LA grade B esophagitis, gastritis, small hiatal hernia.   EYE SURGERY     SALIVARY GLAND SURGERY  1992   Family History  Problem Relation Age of Onset   Hypertension Mother    CVA Father    Diabetes Father    Throat cancer Father    Heart disease Brother 62   Cervical cancer Maternal Grandmother    Throat cancer Maternal Grandmother    Throat cancer Maternal Grandfather    Bladder Cancer Maternal Grandfather    Cirrhosis Maternal Grandfather    Colon cancer Neg Hx    Social History   Tobacco Use   Smoking status: Former    Packs/day: 0.25    Years: 36.00  Total pack years: 9.00    Types: Cigarettes    Quit date: 11/18/2021    Years since quitting: 0.3   Smokeless tobacco: Never   Tobacco comments:    QUIT SMOKING IN APRIL  Substance Use Topics   Alcohol use: Yes    Comment: maybe once a month   No Known Allergies  Prior to Admission medications   Medication Sig Start Date End Date Taking? Authorizing Provider  buPROPion (ZYBAN) 150 MG 12 hr tablet Take 150 mg by mouth 2 (two) times daily.   Yes [provider]  citalopram (CELEXA) 20 MG tablet Take 1 tablet by mouth daily. 04/24/17  Yes [provider]  gabapentin (NEURONTIN) 300 MG capsule Take 300 mg by mouth 2 (two) times daily. Take 2 capsule   in AM and 1 capsule q PM for nerve pain   Yes [provider]  glipiZIDE (GLUCOTROL) 5 MG tablet Take 5 mg by mouth 2 (two) times daily before a meal.   Yes [provider]  LORazepam (ATIVAN) 0.5 MG tablet Take 0.5 mg by mouth every 8 (eight) hours as needed for anxiety.    Yes [provider]  magnesium oxide (MAG-OX) 400 (240 Mg) MG tablet Take 400 mg by mouth daily.   Yes [provider]  metFORMIN (GLUCOPHAGE) 1000 MG tablet Take 500 mg by mouth 2 (two) times daily with a meal.   Yes [provider]  omeprazole (PRILOSEC) 20 MG capsule Take 20 mg by mouth daily. 12/20/21  Yes [provider]  Semaglutide,0.25 or 0.'5MG'$ /DOS, (OZEMPIC, 0.25 OR 0.5 MG/DOSE,) 2 MG/1.5ML SOPN Inject 0.5 mg into the skin once a week.   Yes [provider]  Vitamin D, Ergocalciferol, (DRISDOL) 50000 units CAPS capsule Take 1 capsule by mouth 3 (three) times a week. 02/03/16  Yes [provider]  atorvastatin (LIPITOR) 20 MG tablet Take 1 tablet (20 mg total) by mouth daily. 04/06/22   Alisa Graff, FNP  carvedilol (COREG) 25 MG tablet Take 1 tablet (25 mg total) by mouth 2 (two) times daily with a meal. 04/06/22   Manila Rommel A, FNP  JARDIANCE 10 MG TABS tablet Take 1 tablet (10 mg total) by mouth daily. 04/06/22   Alisa Graff, FNP  sacubitril-valsartan (ENTRESTO) 49-51 MG Take 1 tablet by mouth 2 (two) times daily. 04/06/22   Alisa Graff, FNP  spironolactone (ALDACTONE) 25 MG tablet Take 1 tablet (25 mg total) by mouth daily. 04/06/22   Alisa Graff, FNP  torsemide (DEMADEX) 20 MG tablet Take 2 tablets (40 mg total) by mouth 2 (two) times daily. 04/06/22   Alisa Graff, FNP   Review of Systems  Constitutional:  Positive for fatigue (easily). Negative for appetite change.  HENT:  Positive for hearing loss. Negative for congestion and sore throat.   Eyes: Negative.   Respiratory:  Positive for chest tightness and shortness of breath  (easily). Negative for cough.   Cardiovascular:  Positive for palpitations and leg swelling. Negative for chest pain.  Gastrointestinal:  Positive for abdominal distention. Negative for abdominal pain and diarrhea.  Endocrine: Negative.   Genitourinary: Negative.   Musculoskeletal:  Positive for arthralgias (joints) and myalgias.  Skin: Negative.   Allergic/Immunologic: Negative.   Neurological:  Positive for dizziness and light-headedness (constant but worse with position changes).  Hematological:  Negative for adenopathy. Does not bruise/bleed easily.  Psychiatric/Behavioral:  Negative for dysphoric mood and sleep disturbance (sleeping on 2 pillows with CPAP).  The patient is not nervous/anxious.    Vitals:   04/06/22 0903  BP: 124/80  Pulse: 89  Resp: 14  SpO2: 93%  Weight: (!) 312 lb 2 oz (141.6 kg)  Height: '6\' 2"'$  (1.88 m)   Wt Readings from Last 3 Encounters:  04/06/22 (!) 312 lb 2 oz (141.6 kg)  03/03/22 (!) 306 lb 4 oz (138.9 kg)  03/01/22 (!) 308 lb (139.7 kg)   Lab Results  Component Value Date   CREATININE 1.07 03/01/2022   CREATININE 1.35 (H) 02/15/2022   CREATININE 1.0 12/29/2021   Physical Exam Vitals and nursing note reviewed.  Constitutional:      Appearance: Normal appearance.  HENT:     Head: Normocephalic and atraumatic.  Cardiovascular:     Rate and Rhythm: Normal rate and regular rhythm.  Pulmonary:     Effort: Pulmonary effort is normal. No respiratory distress.     Breath sounds: No wheezing or rales.  Abdominal:     General: There is no distension.     Palpations: Abdomen is soft.  Musculoskeletal:        General: No tenderness.     Cervical back: Normal range of motion and neck supple.     Right lower leg: Edema (trace pitting) present.     Left lower leg: Edema (trace pitting) present.  Skin:    General: Skin is warm and dry.  Neurological:     General: No focal deficit present.     Mental Status: He is alert and oriented to person,  place, and time.  Psychiatric:        Mood and Affect: Mood normal.        Behavior: Behavior normal.   Assessment & Plan:   1: Chronic heart failure with much improved ejection fraction- - NYHA class III - euvolemic today - weighing daily & home weight chart reviewed; reminded to call for an overnight weight gain of > 2 pounds or a weekly weight gain of > 5 pounds - weight up 4 pounds from last visit here 1 month ago - reviewed the importance of keeping daily fluid intake to 60 ounces daily - saw [facility] (Mostertz) 03/20/22 but is not planning on returning - saw cardiology (Stuckey) 03/03/22 with [facility] - on GDMT of carvedilol, entresto, spironolactone & jardiance - discussed referral to [facility] in [place] and he is interested; family history of HF with a brother that died at a young age (4 y/o) - referral placed; explained that when they see him, we would not see him until he was released by them - check BMP today since jardiance has been started - picking up carvedilol after leaving here - pro-BNP 03/20/22 was 106  2: HTN- - BP looks good (124/80) - sees PCP (Kikel) in [place] - BMP 03/20/22 reviewed and showed sodium 138, potassium 4.2, creatinine 0.66 & GFR >90  3: DM- - A1c 12/26/21 was 12.6% - taking glipizide, metformin and ozempic - GI side effects from ozempic have diminished - glucose at home this morning was 151 - will check A1c today - have placed endocrinology referral; [facility] endocrinology is not accepting new patients and [place] will be 09/2022; He will continue with current endocrinology until he gets an appointment elsewhere  4: Tobacco use- - has not smoked since April 2023 - congratulated on this and continued cessation encouraged  5: Obstructive sleep apnea- - saw pulmonology (Ava) 01/02/22 - wearing CPAP 8 hours a night - wearing oxygen at  2L around the clock   Medication bottles reviewed.   Return in 3 months, sooner if needed. Advised  patient to call us and cancel our appointment if he gets an appointment before that at [facility].

## 2022-04-06 ENCOUNTER — Encounter: Payer: Self-pay | Admitting: Family

## 2022-04-06 ENCOUNTER — Ambulatory Visit (HOSPITAL_BASED_OUTPATIENT_CLINIC_OR_DEPARTMENT_OTHER): Payer: BC Managed Care – PPO | Admitting: Family

## 2022-04-06 ENCOUNTER — Other Ambulatory Visit
Admission: RE | Admit: 2022-04-06 | Discharge: 2022-04-06 | Disposition: A | Discharge status: Home / Self Care | Payer: BC Managed Care – PPO | Source: Ambulatory Visit | Attending: Family | Admitting: Family

## 2022-04-06 VITALS — BP 124/80 | HR 89 | Resp 14 | Ht 74.0 in | Wt 312.1 lb

## 2022-04-06 DIAGNOSIS — E119 Type 2 diabetes mellitus without complications: Secondary | ICD-10-CM

## 2022-04-06 DIAGNOSIS — D1809 Hemangioma of other sites: Secondary | ICD-10-CM | POA: Insufficient documentation

## 2022-04-06 DIAGNOSIS — R002 Palpitations: Secondary | ICD-10-CM | POA: Insufficient documentation

## 2022-04-06 DIAGNOSIS — R0789 Other chest pain: Secondary | ICD-10-CM | POA: Insufficient documentation

## 2022-04-06 DIAGNOSIS — K2100 Gastro-esophageal reflux disease with esophagitis, without bleeding: Secondary | ICD-10-CM | POA: Insufficient documentation

## 2022-04-06 DIAGNOSIS — I5022 Chronic systolic (congestive) heart failure: Secondary | ICD-10-CM | POA: Insufficient documentation

## 2022-04-06 DIAGNOSIS — Z79899 Other long term (current) drug therapy: Secondary | ICD-10-CM | POA: Insufficient documentation

## 2022-04-06 DIAGNOSIS — G4733 Obstructive sleep apnea (adult) (pediatric): Secondary | ICD-10-CM

## 2022-04-06 DIAGNOSIS — R42 Dizziness and giddiness: Secondary | ICD-10-CM | POA: Insufficient documentation

## 2022-04-06 DIAGNOSIS — I5032 Chronic diastolic (congestive) heart failure: Secondary | ICD-10-CM

## 2022-04-06 DIAGNOSIS — G4730 Sleep apnea, unspecified: Secondary | ICD-10-CM | POA: Insufficient documentation

## 2022-04-06 DIAGNOSIS — R0602 Shortness of breath: Secondary | ICD-10-CM | POA: Insufficient documentation

## 2022-04-06 DIAGNOSIS — I10 Essential (primary) hypertension: Secondary | ICD-10-CM

## 2022-04-06 DIAGNOSIS — Z9989 Dependence on other enabling machines and devices: Secondary | ICD-10-CM

## 2022-04-06 DIAGNOSIS — M2550 Pain in unspecified joint: Secondary | ICD-10-CM | POA: Insufficient documentation

## 2022-04-06 DIAGNOSIS — Z794 Long term (current) use of insulin: Secondary | ICD-10-CM

## 2022-04-06 DIAGNOSIS — E875 Hyperkalemia: Secondary | ICD-10-CM | POA: Insufficient documentation

## 2022-04-06 DIAGNOSIS — Z720 Tobacco use: Secondary | ICD-10-CM

## 2022-04-06 DIAGNOSIS — R140 Abdominal distension (gaseous): Secondary | ICD-10-CM | POA: Insufficient documentation

## 2022-04-06 DIAGNOSIS — I110 Hypertensive heart disease with heart failure: Secondary | ICD-10-CM | POA: Insufficient documentation

## 2022-04-06 LAB — BASIC METABOLIC PANEL
Anion gap: 11 (ref 5–15)
BUN: 26 mg/dL — ABNORMAL HIGH (ref 6–20)
CO2: 27 mmol/L (ref 22–32)
Calcium: 9.4 mg/dL (ref 8.9–10.3)
Chloride: 100 mmol/L (ref 98–111)
Creatinine, Ser: 1.08 mg/dL (ref 0.61–1.24)
GFR, Estimated: >60 mL/min (ref >60)
Glucose, Bld: 146 mg/dL — ABNORMAL HIGH (ref 70–99)
Potassium: 4.1 mmol/L (ref 3.5–5.1)
Sodium: 138 mmol/L (ref 135–145)

## 2022-04-06 MED ORDER — SPIRONOLACTONE 25 MG PO TABS: 25.0000 mg | ORAL_TABLET | Freq: Every day | ORAL | 3 refills | Status: DC

## 2022-04-06 MED ORDER — JARDIANCE 10 MG PO TABS: 10.0000 mg | ORAL_TABLET | Freq: Every day | ORAL | 3 refills | Status: DC

## 2022-04-06 MED ORDER — ATORVASTATIN CALCIUM 20 MG PO TABS: 20.0000 mg | ORAL_TABLET | Freq: Every day | ORAL | 3 refills | Status: DC

## 2022-04-06 MED ORDER — TORSEMIDE 20 MG PO TABS
40.0000 mg | ORAL_TABLET | Freq: Two times a day (BID) | ORAL | 3 refills | Status: DC
Start: 2022-04-06 — End: 2022-04-14

## 2022-04-06 MED ORDER — CARVEDILOL 25 MG PO TABS: 25.0000 mg | ORAL_TABLET | Freq: Two times a day (BID) | ORAL | 3 refills | Status: DC

## 2022-04-06 MED ORDER — ENTRESTO 49-51 MG PO TABS: 1.0000 | ORAL_TABLET | Freq: Two times a day (BID) | ORAL | 3 refills | Status: DC

## 2022-04-06 NOTE — Patient Instructions (Addendum)
Continue weighing daily and call for an overnight weight gain of 3 pounds or more or a weekly weight gain of more than 5 pounds.   If you have voicemail, please make sure your mailbox is cleaned out so that we may leave a message and please make sure to listen to any voicemails.    [facility] and [facility] will both call you to schedule appointments.

## 2022-04-07 LAB — HEMOGLOBIN A1C
Hgb A1c MFr Bld: 6 % — ABNORMAL HIGH (ref 4.8–5.6)
Mean Plasma Glucose: 125.5 mg/dL

## 2022-04-14 ENCOUNTER — Ambulatory Visit: Payer: BC Managed Care – PPO | Attending: Cardiology | Admitting: Cardiology

## 2022-04-14 ENCOUNTER — Encounter: Payer: Self-pay | Admitting: Cardiology

## 2022-04-14 VITALS — BP 90/60 | HR 92 | Ht 74.0 in | Wt 309.0 lb

## 2022-04-14 DIAGNOSIS — I10 Essential (primary) hypertension: Secondary | ICD-10-CM

## 2022-04-14 DIAGNOSIS — I428 Other cardiomyopathies: Secondary | ICD-10-CM

## 2022-04-14 DIAGNOSIS — E7800 Pure hypercholesterolemia, unspecified: Secondary | ICD-10-CM

## 2022-04-14 MED ORDER — SPIRONOLACTONE 25 MG PO TABS: 12.5000 mg | ORAL_TABLET | Freq: Every day | ORAL | 3 refills | Status: DC

## 2022-04-14 MED ORDER — TORSEMIDE 20 MG PO TABS: 40.0000 mg | ORAL_TABLET | Freq: Every day | ORAL | 3 refills | Status: DC

## 2022-04-14 NOTE — Progress Notes (Signed)
Cardiology Office Note:    Date:  04/14/2022   ID:  [name] Gregory, DOB 1971/02/10, MRN [id number]  PCP:  Vidal Schwalbe, MD   [place] Providers Cardiologist:  None     Referring MD: Vidal Schwalbe, MD   Chief Complaint  Patient presents with   Other    6 wk f/u no complaints today. Meds reviewed verbally with pt.    History of Present Illness:    [name] is a 51 y.o. male with a hx of NICM, last EF 20 to 25%, hypertension, diabetes, OSA/CPAP, former smoker who presents to for follow-up, being seen due to cardiomyopathy and medication titration  Previously developed lightheadedness with higher dose of Entresto, this was reduced to 49-51 mg twice daily, also takes Coreg and Aldactone.  Repeat echocardiogram was obtained 02/2022 to evaluate ejection fraction.  He feels well, tolerating medications as prescribed, has occasional dizziness, denies edema.  Prior notes Outside records/reports from [facility] Echo 11/18/2020 EF 20 to 25% Left heart cath 11/18/2021 no evidence for CAD Brother has history of CHF, died at age 30  Past Medical History:  Diagnosis Date   CHF (congestive heart failure) (HCC)    Complication of anesthesia    WOKE UP DURING SURGERY   Diabetes ([place])    GERD (gastroesophageal reflux disease)    Hemangioma of liver    Hypertension    Sleep apnea    URI (upper respiratory infection)     Past Surgical History:  Procedure Laterality Date   BIOPSY  05/04/2016   Procedure: BIOPSY;  Surgeon: Daneil Dolin, MD;  [facility];  Service: Endoscopy;;  gastric bx   COLONOSCOPY WITH PROPOFOL N/A 05/04/2016    PROPOFOL;  Surgeon: Daneil Dolin, MD; diverticulosis throughout the entire colon, otherwise normal.  Repeat due in 2027.   ESOPHAGOGASTRODUODENOSCOPY (EGD) WITH PROPOFOL N/A 05/04/2016   PROPOFOL;  Surgeon: Daneil Dolin, MD; LA grade B esophagitis, gastritis, small hiatal hernia.   EYE SURGERY     SALIVARY GLAND SURGERY  1992     Current Medications: Current Meds  Medication Sig   atorvastatin (LIPITOR) 20 MG tablet Take 1 tablet (20 mg total) by mouth daily.   buPROPion (ZYBAN) 150 MG 12 hr tablet Take 150 mg by mouth 2 (two) times daily.   carvedilol (COREG) 25 MG tablet Take 1 tablet (25 mg total) by mouth 2 (two) times daily with a meal.   citalopram (CELEXA) 20 MG tablet Take 1 tablet by mouth daily.   gabapentin (NEURONTIN) 300 MG capsule Take 300 mg by mouth 2 (two) times daily. Take 2 capsule  in AM and 1 capsule q PM for nerve pain   glipiZIDE (GLUCOTROL) 5 MG tablet Take 5 mg by mouth 2 (two) times daily before a meal.   JARDIANCE 10 MG TABS tablet Take 1 tablet (10 mg total) by mouth daily.   LORazepam (ATIVAN) 0.5 MG tablet Take 0.5 mg by mouth every 8 (eight) hours as needed for anxiety.    magnesium oxide (MAG-OX) 400 (240 Mg) MG tablet Take 400 mg by mouth daily.   metFORMIN (GLUCOPHAGE) 1000 MG tablet Take 500 mg by mouth 2 (two) times daily with a meal.   omeprazole (PRILOSEC) 20 MG capsule Take 20 mg by mouth daily.   sacubitril-valsartan (ENTRESTO) 49-51 MG Take 1 tablet by mouth 2 (two) times daily.   Semaglutide,0.25 or 0.'5MG'$ /DOS, (OZEMPIC, 0.25 OR 0.5 MG/DOSE,) 2 MG/1.5ML SOPN Inject 0.5 mg into  the skin once a week.   Vitamin D, Ergocalciferol, (DRISDOL) 50000 units CAPS capsule Take 1 capsule by mouth 3 (three) times a week.   [DISCONTINUED] spironolactone (ALDACTONE) 25 MG tablet Take 1 tablet (25 mg total) by mouth daily.   [DISCONTINUED] torsemide (DEMADEX) 20 MG tablet Take 2 tablets (40 mg total) by mouth 2 (two) times daily.     Allergies:   Patient has no known allergies.   Social History   Socioeconomic History   Marital status: Married    Spouse name: Not on file   Number of children: Not on file   Years of education: Not on file   Highest education level: Not on file  Occupational History   Occupation: administrative for [employer]  Tobacco Use   Smoking status: Former     Packs/day: 0.25    Years: 36.00    Total pack years: 9.00    Types: Cigarettes    Quit date: 11/18/2021    Years since quitting: 0.4   Smokeless tobacco: Never   Tobacco comments:    QUIT SMOKING IN APRIL  Vaping Use   Vaping Use: Every day  Substance and Sexual Activity   Alcohol use: Yes    Comment: maybe once a month   Drug use: No   Sexual activity: Not on file  Other Topics Concern   Not on file  Social History Narrative   Not on file   Social Determinants of Health   Financial Resource Strain: Unknown (07/17/2019)   Overall Financial Resource Strain (CARDIA)    Difficulty of Paying Living Expenses: Patient refused  Food Insecurity: Unknown (07/17/2019)   Hunger Vital Sign    Worried About [place] in the Last Year: Patient refused    [place] in the Last Year: Patient refused  Transportation Needs: Unknown (07/17/2019)   [place] - Hydrologist (Medical): Patient refused    Lack of Transportation (Non-Medical): Patient refused  Physical Activity: Unknown (07/17/2019)   Exercise Vital Sign    Days of Exercise per Week: Patient refused    Minutes of Exercise per Session: Patient refused  Stress: Not on file  Social Connections: Unknown (07/17/2019)   Social Connection and Isolation Panel [NHANES]    Frequency of Communication with Friends and Family: Patient refused    Frequency of Social Gatherings with Friends and Family: Patient refused    Attends Religious Services: Patient refused    Marine scientist or Organizations: Patient refused    Attends Music therapist: Patient refused    Marital Status: Patient refused     Family History: The patient's family history includes Bladder Cancer in his maternal grandfather; CVA in his father; Cervical cancer in his maternal grandmother; Cirrhosis in his maternal grandfather; Diabetes in his father; Heart disease (age of onset: 73) in his brother; Hypertension  in his mother; Throat cancer in his father, maternal grandfather, and maternal grandmother. There is no history of Colon cancer.  ROS:   Please see the history of present illness.     All other systems reviewed and are negative.  EKGs/Labs/Other Studies Reviewed:    The following studies were reviewed today:   EKG:  EKG not ordered today.    Recent Labs: 04/06/2022: BUN 26; Creatinine, Ser 1.08; Potassium 4.1; Sodium 138  Recent Lipid Panel    Component Value Date/Time   CHOL  12/16/2008 0340    185  ATP III CLASSIFICATION:  <200     mg/dL   Desirable  200-239  mg/dL   Borderline High  >=240    mg/dL   High          TRIG 203 (H) 12/16/2008 0340   HDL 37 (L) 12/16/2008 0340   CHOLHDL 5.0 12/16/2008 0340   VLDL 41 (H) 12/16/2008 0340   LDLCALC (H) 12/16/2008 0340    107        Total Cholesterol/HDL:CHD Risk Coronary Heart Disease Risk Table                     Men   Women  1/2 Average Risk   3.4   3.3  Average Risk       5.0   4.4  2 X Average Risk   9.6   7.1  3 X Average Risk  23.4   11.0        Use the calculated Patient Ratio above and the CHD Risk Table to determine the patient's CHD Risk.        ATP III CLASSIFICATION (LDL):  <100     mg/dL   Optimal  100-129  mg/dL   Near or Above                    Optimal  130-159  mg/dL   Borderline  160-189  mg/dL   High  >190     mg/dL   Very High     Risk Assessment/Calculations:          Physical Exam:    VS:  BP 90/60 (BP Location: Left Arm, Patient Position: Sitting, Cuff Size: Normal)   Pulse 92   Ht '6\' 2"'$  (1.88 m)   Wt (!) 309 lb (140.2 kg)   SpO2 98%   BMI 39.67 kg/m     Wt Readings from Last 3 Encounters:  04/14/22 (!) 309 lb (140.2 kg)  04/06/22 (!) 312 lb 2 oz (141.6 kg)  03/03/22 (!) 306 lb 4 oz (138.9 kg)     GEN:  Well nourished, well developed in no acute distress HEENT: Normal NECK: No JVD; No carotid bruits LYMPHATICS: No lymphadenopathy CARDIAC: RRR, no murmurs, rubs,  gallops RESPIRATORY:  Clear to auscultation without rales, wheezing or rhonchi  ABDOMEN: Soft, non-tender, non-distended MUSCULOSKELETAL:  No edema; No deformity  SKIN: Warm and dry NEUROLOGIC:  Alert and oriented x 3 PSYCHIATRIC:  Normal affect   ASSESSMENT:    1. NICM (nonischemic cardiomyopathy) ([place])   2. Primary hypertension   3. Pure hypercholesterolemia     PLAN:    In order of problems listed above:  Nonischemic cardiomyopathy, etiology possibly hereditary .  Initial echo 20 to 25%, last echocardiogram reviewed by myself with improvement, EF 45 to 50%.  Endorses dizziness, fatigue, reduce Aldactone to 12.5 mg daily, reduce torsemide to 40 mg daily. continue Coreg 25 twice daily, Entresto 49-51 twice daily, Jardiance. Hypertension, BP low normal .Continue Coreg, Entresto, Aldactone as above. Hyperlipidemia, continue Lipitor.  Follow-up in 6 months.      Medication Adjustments/Labs and Tests Ordered: Current medicines are reviewed at length with the patient today.  Concerns regarding medicines are outlined above.  No orders of the defined types were placed in this encounter.  Meds ordered this encounter  Medications   torsemide (DEMADEX) 20 MG tablet    Sig: Take 2 tablets (40 mg total) by mouth daily.    Dispense:  360 tablet  Refill:  3   spironolactone (ALDACTONE) 25 MG tablet    Sig: Take 0.5 tablets (12.5 mg total) by mouth daily.    Dispense:  90 tablet    Refill:  3    Patient Instructions  Medication Instructions:   Your physician has recommended you make the following change in your medication:    DECREASE your Torsemide to 40 MG ONCE A DAY.  2.    DECREASE your Spironolactone to 12.5 MG ONCE A DAY. (This will be half a tablet)   *If you need a refill on your cardiac medications before your next appointment, please call your pharmacy*   Follow-Up: At [facility], you and your health needs are our priority.  As part of our continuing  mission to provide you with exceptional heart care, we have created designated Provider Care Teams.  These Care Teams include your primary Cardiologist (physician) and Advanced Practice Providers (APPs -  Physician Assistants and Nurse Practitioners) who all work together to provide you with the care you need, when you need it.  We recommend signing up for the patient portal called "MyChart".  Sign up information is provided on this After Visit Summary.  MyChart is used to connect with patients for Virtual Visits (Telemedicine).  Patients are able to view lab/test results, encounter notes, upcoming appointments, etc.  Non-urgent messages can be sent to your provider as well.   To learn more about what you can do with MyChart, go to [email or web address].    Your next appointment:   6 month(s)  The format for your next appointment:   In Person  Provider:   Kate Sable, MD    Other Instructions   Important Information About Sugar         Signed, Kate Sable, MD  04/14/2022 3:29 PM    [place]

## 2022-04-14 NOTE — Patient Instructions (Signed)
Medication Instructions:   Your physician has recommended you make the following change in your medication:    DECREASE your Torsemide to 40 MG ONCE A DAY.  2.    DECREASE your Spironolactone to 12.5 MG ONCE A DAY. (This will be half a tablet)   *If you need a refill on your cardiac medications before your next appointment, please call your pharmacy*   Follow-Up: At [facility], you and your health needs are our priority.  As part of our continuing mission to provide you with exceptional heart care, we have created designated Provider Care Teams.  These Care Teams include your primary Cardiologist (physician) and Advanced Practice Providers (APPs -  Physician Assistants and Nurse Practitioners) who all work together to provide you with the care you need, when you need it.  We recommend signing up for the patient portal called "MyChart".  Sign up information is provided on this After Visit Summary.  MyChart is used to connect with patients for Virtual Visits (Telemedicine).  Patients are able to view lab/test results, encounter notes, upcoming appointments, etc.  Non-urgent messages can be sent to your provider as well.   To learn more about what you can do with MyChart, go to [email or web address].    Your next appointment:   6 month(s)  The format for your next appointment:   In Person  Provider:   Kate Sable, MD    Other Instructions   Important Information About Sugar

## 2022-05-20 ENCOUNTER — Encounter: Payer: Self-pay | Admitting: Family

## 2022-05-22 ENCOUNTER — Telehealth: Payer: Self-pay | Admitting: Family

## 2022-05-22 NOTE — Telephone Encounter (Signed)
Called and followed up per patients request with referrals that were submitted in august for Endo and Advanced HF clinic in [place] and then called patient back to let him know that they both were just a little behind but should hear something from both of them soon.    [name] Gregory, NT

## 2022-06-07 ENCOUNTER — Ambulatory Visit: Payer: BC Managed Care – PPO | Attending: Internal Medicine | Admitting: Internal Medicine

## 2022-06-07 ENCOUNTER — Encounter: Payer: Self-pay | Admitting: Internal Medicine

## 2022-06-07 VITALS — BP 105/75 | HR 91 | Ht 74.0 in | Wt 310.1 lb

## 2022-06-07 DIAGNOSIS — I5032 Chronic diastolic (congestive) heart failure: Secondary | ICD-10-CM

## 2022-06-07 DIAGNOSIS — I10 Essential (primary) hypertension: Secondary | ICD-10-CM | POA: Diagnosis not present

## 2022-06-07 DIAGNOSIS — I428 Other cardiomyopathies: Secondary | ICD-10-CM | POA: Diagnosis present

## 2022-06-07 DIAGNOSIS — G4733 Obstructive sleep apnea (adult) (pediatric): Secondary | ICD-10-CM

## 2022-06-07 DIAGNOSIS — I5022 Chronic systolic (congestive) heart failure: Secondary | ICD-10-CM | POA: Diagnosis not present

## 2022-06-07 LAB — COMPREHENSIVE METABOLIC PANEL
ALT: 17 U/L (ref 0–44)
AST: 14 U/L — ABNORMAL LOW (ref 15–41)
Albumin: 3.9 g/dL (ref 3.5–5.0)
Alkaline Phosphatase: 113 U/L (ref 38–126)
Anion gap: 8 (ref 5–15)
BUN: 24 mg/dL — ABNORMAL HIGH (ref 6–20)
CO2: 29 mmol/L (ref 22–32)
Calcium: 9.4 mg/dL (ref 8.9–10.3)
Chloride: 101 mmol/L (ref 98–111)
Creatinine, Ser: 1.19 mg/dL (ref 0.61–1.24)
GFR, Estimated: >60 mL/min (ref >60)
Glucose, Bld: 179 mg/dL — ABNORMAL HIGH (ref 70–99)
Potassium: 4.2 mmol/L (ref 3.5–5.1)
Sodium: 138 mmol/L (ref 135–145)
Total Bilirubin: 0.4 mg/dL (ref 0.3–1.2)
Total Protein: 7.5 g/dL (ref 6.5–8.1)

## 2022-06-07 LAB — BRAIN NATRIURETIC PEPTIDE: B Natriuretic Peptide: 5.2 pg/mL (ref 0.0–100.0)

## 2022-06-07 MED ORDER — TORSEMIDE 20 MG PO TABS: 40.0000 mg | ORAL_TABLET | Freq: Two times a day (BID) | ORAL | 6 refills | Status: DC

## 2022-06-07 NOTE — Patient Instructions (Signed)
Medication Changes:  STOP Amlodipine  Continue Torsemide 40 mg (2 tabs) Twice daily, can take extra tab as needed for swelling  Lab Work:  Labs done today, your results will be available in MyChart, we will contact you for abnormal readings.   Testing/Procedures:  Your physician has requested that you have a cardiac MRI. Cardiac MRI uses a computer to create images of your heart as its beating, producing both still and moving pictures of your heart and major blood vessels. For further information please visit [email or web address] Please follow the instruction sheet given to you today for more information. ONCE APPROVED BY INSURANCE YOU WILL BE CONTACTED TO SCHEDULE  Your physician has recommended that you have a cardiopulmonary stress test (CPX). CPX testing is a non-invasive measurement of heart and lung function. It replaces a traditional treadmill stress test. This type of test provides a tremendous amount of information that relates not only to your present condition but also for future outcomes. This test combines measurements of you ventilation, respiratory gas exchange in the lungs, electrocardiogram (EKG), blood pressure and physical response before, during, and following an exercise protocol. THIS WILL BE SCHEDULED IN FEBRUARY, THEY WILL CALL YOU CLOSER TO THAT TIME TO SCHEDULE  Referrals:  none  Special Instructions // Education:  Do the following things EVERYDAY: Weigh yourself in the morning before breakfast. Write it down and keep it in a log. Take your medicines as prescribed Eat low salt foods--Limit salt (sodium) to 2000 mg per day.  Stay as active as you can everyday Limit all fluids for the day to less than 2 liters   Follow-Up in: 3-4 weeks

## 2022-06-07 NOTE — Addendum Note (Signed)
Addended by: Scarlette Calico on: 06/07/2022 03:54 PM   Modules accepted: Orders

## 2022-06-07 NOTE — Progress Notes (Signed)
ReDS Vest / Clip - 06/07/22 0936       ReDS Vest / Clip   Station Marker D    Ruler Value 34    ReDS Value Range Moderate volume overload    ReDS Actual Value 38

## 2022-06-07 NOTE — Progress Notes (Signed)
[facility] HF CLINIC CONSULT NOTE  Referring Physician: Dr. Garen Lah Primary Care: Vidal Schwalbe, MD Primary Cardiologist: Dr. Garen Lah   HPI:  [name] Gregory is a 51 y.o. male with a history of HTN, T2DM, tobacco abuse, obesity, OSA (on CPAP) and HFrEF referred by Dr. Garen Lah for further evaluation of his GF.   Has multiple family members with HF on his father's side. Developed HF symptoms in Jan/Feb 2023. He had a prior TTE in 2020 that showed moderate concentric hypertrophy with normal EF. He was seen by Dr. Gennette Pac at [facility] 11/03/21 nd underwent echo and stress test. 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 LVEDP was elevated at 37mHg. Echo same day showed EF 20-25%, LV dilation, hypokinesis of the apical anterior, apical, apical inferior and apical septal segments, and grade III diastolic dysfunction. He also complained that his heart was fluttering, lasts for several seconds. Given this a Ziopatch was placed which was unrevealing.   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 Referred to genetics for further discussion    [facility] HF meds have been titrated. Recently had to cut back Entresto due to low BP.     Echo 8/23 @ [facility] EF 55%  Here with his mother.Gets winded easily. Dizzy at times when he stands. Stopped smoking in 3/23. Wears CPAP religiously. Denies LE edema. Feels bloated in his belly. Having nausea and sulfa smell from Ozempic.   ReDS 38%    Review of Systems: [y] = yes, '[ ]'$  = no   General: Weight gain '[ ]'$ ; Weight loss '[ ]'$ ; Anorexia '[ ]'$ ; Fatigue '[ ]'$ ; Fever '[ ]'$ ; Chills '[ ]'$ ; Weakness '[ ]'$   Cardiac: Chest pain/pressure '[ ]'$ ; Resting SOB '[ ]'$ ; Exertional SOB [ y]; Orthopnea '[ ]'$ ; Pedal Edema '[ ]'$ ; Palpitations [  y]; Syncope '[ ]'$ ; Presyncope '[ ]'$ ; Paroxysmal nocturnal dyspnea'[ ]'$   Pulmonary: Cough '[ ]'$ ; Wheezing'[ ]'$ ; Hemoptysis'[ ]'$ ; Sputum '[ ]'$ ; Snoring '[ ]'$   GI: Vomiting'[ ]'$ ; Dysphagia'[ ]'$ ; Melena'[ ]'$ ; Hematochezia '[ ]'$ ; Heartburn'[ ]'$ ; Abdominal pain '[ ]'$ ; Constipation '[ ]'$ ; Diarrhea '[ ]'$ ; BRBPR '[ ]'$   GU: Hematuria'[ ]'$ ; Dysuria '[ ]'$ ; Nocturia'[ ]'$   Vascular: Pain in legs with walking '[ ]'$ ; Pain in feet with lying flat '[ ]'$ ; Non-healing sores '[ ]'$ ; Stroke '[ ]'$ ; TIA '[ ]'$ ; Slurred speech '[ ]'$ ;  Neuro: Headaches'[ ]'$ ; Vertigo'[ ]'$ ; Seizures'[ ]'$ ; Paresthesias'[ ]'$ ;Blurred vision '[ ]'$ ; Diplopia '[ ]'$ ; Vision changes '[ ]'$   Ortho/Skin: Arthritis '[ ]'$ ; Joint pain '[ ]'$ ; Muscle pain '[ ]'$ ; Joint swelling '[ ]'$ ; Back Pain '[ ]'$ ; Rash '[ ]'$   Psych: Depression[ y]; Anxiety'[ ]'$   Heme: Bleeding problems '[ ]'$ ; Clotting disorders '[ ]'$ ; Anemia '[ ]'$   Endocrine: Diabetes [ y]; Thyroid dysfunction'[ ]'$    Past Medical History:  Diagnosis Date   CHF (congestive heart failure) (HCC)    Complication of anesthesia    WOKE UP DURING SURGERY   Diabetes (HCC)    GERD (gastroesophageal reflux disease)    Hemangioma of liver    Hypertension    Sleep apnea    URI (upper respiratory infection)     Current Outpatient Medications  Medication Sig Dispense Refill   amLODipine (NORVASC) 10 MG tablet Take 5 mg by mouth daily.     atorvastatin (  LIPITOR) 20 MG tablet Take 1 tablet (20 mg total) by mouth daily. 90 tablet 3   buPROPion (ZYBAN) 150 MG 12 hr tablet Take 150 mg by mouth 2 (two) times daily.     carvedilol (COREG) 25 MG tablet Take 1 tablet (25 mg total) by mouth 2 (two) times daily with a meal. 180 tablet 3   citalopram (CELEXA) 20 MG tablet Take 1 tablet by mouth daily.     gabapentin (NEURONTIN) 300 MG capsule Take 300 mg by mouth 2 (two) times daily. Take 2 capsule  in AM and 1 capsule q PM for nerve pain     glipiZIDE (GLUCOTROL) 5 MG tablet Take 5 mg by mouth 2 (two) times daily before a meal.     JARDIANCE 10 MG TABS tablet Take 1 tablet (10 mg total) by mouth daily.  90 tablet 3   LORazepam (ATIVAN) 0.5 MG tablet Take 0.5 mg by mouth every 8 (eight) hours as needed for anxiety.      magnesium oxide (MAG-OX) 400 (240 Mg) MG tablet Take 400 mg by mouth daily.     metFORMIN (GLUCOPHAGE) 1000 MG tablet Take 1,000 mg by mouth 2 (two) times daily with a meal.     omeprazole (PRILOSEC) 20 MG capsule Take 20 mg by mouth daily.     sacubitril-valsartan (ENTRESTO) 49-51 MG Take 1 tablet by mouth 2 (two) times daily. 180 tablet 3   Semaglutide,0.25 or 0.'5MG'$ /DOS, (OZEMPIC, 0.25 OR 0.5 MG/DOSE,) 2 MG/1.5ML SOPN Inject 0.5 mg into the skin once a week.     spironolactone (ALDACTONE) 25 MG tablet Take 25 mg by mouth daily.     torsemide (DEMADEX) 20 MG tablet Take 40 mg by mouth 2 (two) times daily.     Vitamin D, Ergocalciferol, (DRISDOL) 50000 units CAPS capsule Take 1 capsule by mouth 3 (three) times a week.     No current facility-administered medications for this visit.    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[employer]  Tobacco Use   Smoking status: Former    Packs/day: 0.25    Years: 36.00    Total pack years: 9.00    Types: Cigarettes    Quit date: 11/18/2021    Years since quitting: 0.5   Smokeless tobacco: Never   Tobacco comments:    QUIT SMOKING IN APRIL  Vaping Use   Vaping Use: Every day  Substance and Sexual Activity   Alcohol use: Yes    Comment: maybe once a month   Drug use: No   Sexual activity: Not on file  Other Topics Concern   Not on file  Social History Narrative   Not on file   Social Determinants of Health   Financial Resource Strain: Unknown (07/17/2019)   Overall Financial Resource Strain (CARDIA)    Difficulty of Paying Living Expenses: Patient refused  Food Insecurity: Unknown  (07/17/2019)   Hunger Vital Sign    Worried About [place] in the Last Year: Patient refused    [place] in the Last Year: Patient refused  Transportation Needs: Unknown (07/17/2019)   PRAPARE - Hydrologist (Medical): Patient refused  Lack of Transportation (Non-Medical): Patient refused  Physical Activity: Unknown (07/17/2019)   Exercise Vital Sign    Days of Exercise per Week: Patient refused    Minutes of Exercise per Session: Patient refused  Stress: Not on file  Social Connections: Unknown (07/17/2019)   Social Connection and Isolation Panel [NHANES]    Frequency of Communication with Friends and Family: Patient refused    Frequency of Social Gatherings with Friends and Family: Patient refused    Attends Religious Services: Patient refused    Active Member of Clubs or Organizations: Patient refused    Attends Archivist Meetings: Patient refused    Marital Status: Patient refused  Intimate Partner Violence: Unknown (07/17/2019)   Humiliation, Afraid, Rape, and Kick questionnaire    Fear of Current or Ex-Partner: Patient refused    Emotionally Abused: Patient refused    Physically Abused: Patient refused    Sexually Abused: Patient refused      Family History  Problem Relation Age of Onset   Hypertension Mother    CVA Father    Diabetes Father    Throat cancer Father    Heart disease Brother 29   Cervical cancer Maternal Grandmother    Throat cancer Maternal Grandmother    Throat cancer Maternal Grandfather    Bladder Cancer Maternal Grandfather    Cirrhosis Maternal Grandfather    Colon cancer Neg Hx     Vitals:   06/07/22 0936  BP: 105/75  Pulse: 91  SpO2: 99%  Weight: (!) 140.7 kg  Height: '6\' 2"'$  (1.88 m)    PHYSICAL EXAM: General:  Well appearing. No respiratory difficulty HEENT: normal Neck: supple. no JVD. Carotids 2+ bilat; no bruits. No lymphadenopathy or thryomegaly appreciated. Cor: PMI  nondisplaced. Regular rate & rhythm. No rubs, gallops or murmurs. Lungs: clear Abdomen: soft, nontender, nondistended. No hepatosplenomegaly. No bruits or masses. Good bowel sounds. Extremities: no cyanosis, clubbing, rash, edema Neuro: alert & oriented x 3, cranial nerves grossly intact. moves all 4 extremities w/o difficulty. Affect pleasant.   ASSESSMENT & PLAN:   1. 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 Referred to genetics for further discussion - SPEP/IFE showed no monoclonal protein - Echo 4/24 Ef 20-25% - Cath 4/23 non-obstructive CAD - Echo 8/23 [facility]) with recovered LVEF 55%, LVIDd ~5.5, normal RV, RVSP ~29 - Despite recovered EF still with NYHA III symptoms - Volume not too bad. ReDS 38% Continue torsemide 40 bid. Take extra as needed. Discussed fluid restriction.  - Continue Jardiance 10  - Continue Entresto 49/51 bid - Continue Spiro 25 daily - Stop amlodipine. - With recovery of EF unclear how much of a role genetics is playing here but will certainly need to follow EF closely. Will get cMRI to further characterize - Get CPX to further assess as symptoms persist despite recovery of EF - Consider CR  2. Essential hypertension - BP well controlled. - Stop amlodipine to potentially allow for further titration of Entresto  3. Type 2 diabetes mellitus - Followed by PCP  4. Palpitations - Prior ziopatch completed in Apr 2023, no significant arrhythmias, rare ventricular ectopy  5. Morbid obesity - Body mass index is 39.82 kg/m. -  continue ozempic  6. Chronic Hypoxic Respiratory Failure: - Per patient, was dropping O2 sat with  exertion and qualified for home O2 - Unclear why he's requiring O2. Has been evaluted by Pulmonology (Dr. Elsworth Soho) - No decline in sat with 6MW - will check CPX  Total time spent 45 minutes. Over half that time spent discussing above.   Glori Bickers, MD  2:54 PM

## 2022-06-08 ENCOUNTER — Telehealth (HOSPITAL_COMMUNITY): Payer: Self-pay | Admitting: *Deleted

## 2022-06-08 DIAGNOSIS — I428 Other cardiomyopathies: Secondary | ICD-10-CM

## 2022-06-08 NOTE — Telephone Encounter (Signed)
Pt sch for cMRI 11/1 at 11 am, he is aware labs are needed prior (hgb), order placed, pt will [facility]

## 2022-06-12 ENCOUNTER — Telehealth (HOSPITAL_COMMUNITY): Payer: Self-pay | Admitting: *Deleted

## 2022-06-12 NOTE — Telephone Encounter (Signed)
Reaching out to patient to offer assistance regarding upcoming cardiac imaging study; pt verbalizes understanding of appt date/time, parking situation and where to check in,  and verified current allergies; name and call back number provided for further questions should they arise  [name] Clement RN Navigator Cardiac [place] [facility] [phone] office [phone] cell   Patient aware to obtain labs prior to his appointment.  He states he's had MRIs prior without incident.

## 2022-06-13 ENCOUNTER — Other Ambulatory Visit
Admission: RE | Admit: 2022-06-13 | Discharge: 2022-06-13 | Disposition: A | Discharge status: Home / Self Care | Payer: BC Managed Care – PPO | Attending: Internal Medicine | Admitting: Internal Medicine

## 2022-06-13 DIAGNOSIS — I428 Other cardiomyopathies: Secondary | ICD-10-CM | POA: Insufficient documentation

## 2022-06-13 LAB — CBC
HCT: 34.2 % — ABNORMAL LOW (ref 39.0–52.0)
Hemoglobin: 10.6 g/dL — ABNORMAL LOW (ref 13.0–17.0)
MCH: 23.4 pg — ABNORMAL LOW (ref 26.0–34.0)
MCHC: 31 g/dL (ref 30.0–36.0)
MCV: 75.5 fL — ABNORMAL LOW (ref 80.0–100.0)
Platelets: 282 10*3/uL (ref 150–400)
RBC: 4.53 MIL/uL (ref 4.22–5.81)
RDW: 13.3 % (ref 11.5–15.5)
WBC: 12 10*3/uL — ABNORMAL HIGH (ref 4.0–10.5)
nRBC: 0 % (ref 0.0–0.2)

## 2022-06-14 ENCOUNTER — Other Ambulatory Visit: Payer: Self-pay | Admitting: Internal Medicine

## 2022-06-14 ENCOUNTER — Ambulatory Visit
Admission: RE | Admit: 2022-06-14 | Discharge: 2022-06-14 | Disposition: A | Discharge status: Home / Self Care | Payer: BC Managed Care – PPO | Source: Ambulatory Visit | Attending: Internal Medicine | Admitting: Internal Medicine

## 2022-06-14 DIAGNOSIS — I428 Other cardiomyopathies: Secondary | ICD-10-CM | POA: Diagnosis present

## 2022-06-14 MED ORDER — GADOBUTROL 1 MMOL/ML IV SOLN
17.0000 mL | Freq: Once | INTRAVENOUS | Status: AC | PRN
  Administered 2022-06-14: 17 mL via INTRAVENOUS

## 2022-06-20 ENCOUNTER — Telehealth: Payer: Self-pay | Admitting: Pharmacist

## 2022-06-20 MED ORDER — SEMAGLUTIDE(0.25 OR 0.5MG/DOS) 2 MG/3ML ~~LOC~~ SOPN: 1.0000 mg | PEN_INJECTOR | SUBCUTANEOUS | 1 refills | Status: DC

## 2022-06-20 NOTE — Telephone Encounter (Signed)
I called patient to discuss increasing ozempic. He would like to increase to '1mg'$ . I have asked him to decrease his glipizide to 2.'5mg'$  twice a day. Will follow up in 3 weeks.  When on the phone patient states that he has congestion and now he has gained 3-4lb this week. Feels like he can't get his breath when trying to sleep. Swelling in legs and ankle since the weekend. Been taking extra torsemide since 10/25.   Taking 60 in the AM and '40mg'$  in the PM.  BP is now high. 137-140/99-101. I will forward to [facility] for recommendations. I advised patient that if he didn't hear anything bu this evening to take 3 torsemide tonight.

## 2022-06-20 NOTE — Telephone Encounter (Signed)
PA approved through 06/19/23

## 2022-06-20 NOTE — Telephone Encounter (Signed)
Key: [id number] PA for Ozempic submitted

## 2022-06-20 NOTE — Telephone Encounter (Signed)
Spoke w/pt, he reports wt is up 4 lbs since Sat, +LE edema half way up to knees, +SOB with laying flat. He states he has been taking Torsemide 60 mg in AM and 40 mg in PM since OV 10/25 as he felt she has had fluid but it has only gotten worse since Sat. BP has been running higher at 130/90s. Will discuss with Dr Haroldine Laws and call pt back.

## 2022-06-20 NOTE — Telephone Encounter (Signed)
Pt aware of MRI results, appt sch for 11/8 at [facility]

## 2022-06-21 ENCOUNTER — Encounter (HOSPITAL_COMMUNITY): Payer: Self-pay | Admitting: Internal Medicine

## 2022-06-21 ENCOUNTER — Other Ambulatory Visit
Admission: RE | Admit: 2022-06-21 | Discharge: 2022-06-21 | Disposition: A | Discharge status: Home / Self Care | Payer: BC Managed Care – PPO | Source: Ambulatory Visit | Attending: Internal Medicine | Admitting: Internal Medicine

## 2022-06-21 ENCOUNTER — Ambulatory Visit (HOSPITAL_BASED_OUTPATIENT_CLINIC_OR_DEPARTMENT_OTHER): Payer: BC Managed Care – PPO | Admitting: Internal Medicine

## 2022-06-21 ENCOUNTER — Ambulatory Visit
Admission: RE | Admit: 2022-06-21 | Discharge: 2022-06-21 | Disposition: A | Discharge status: Home / Self Care | Payer: BC Managed Care – PPO | Source: Ambulatory Visit | Attending: Internal Medicine | Admitting: Internal Medicine

## 2022-06-21 VITALS — BP 126/92 | HR 93 | Wt 318.2 lb

## 2022-06-21 DIAGNOSIS — J9611 Chronic respiratory failure with hypoxia: Secondary | ICD-10-CM | POA: Insufficient documentation

## 2022-06-21 DIAGNOSIS — G4733 Obstructive sleep apnea (adult) (pediatric): Secondary | ICD-10-CM | POA: Insufficient documentation

## 2022-06-21 DIAGNOSIS — I110 Hypertensive heart disease with heart failure: Secondary | ICD-10-CM | POA: Insufficient documentation

## 2022-06-21 DIAGNOSIS — I5022 Chronic systolic (congestive) heart failure: Secondary | ICD-10-CM | POA: Insufficient documentation

## 2022-06-21 DIAGNOSIS — R0600 Dyspnea, unspecified: Secondary | ICD-10-CM | POA: Diagnosis not present

## 2022-06-21 DIAGNOSIS — Z79899 Other long term (current) drug therapy: Secondary | ICD-10-CM | POA: Insufficient documentation

## 2022-06-21 DIAGNOSIS — R002 Palpitations: Secondary | ICD-10-CM | POA: Insufficient documentation

## 2022-06-21 DIAGNOSIS — E119 Type 2 diabetes mellitus without complications: Secondary | ICD-10-CM | POA: Insufficient documentation

## 2022-06-21 DIAGNOSIS — Z87891 Personal history of nicotine dependence: Secondary | ICD-10-CM | POA: Insufficient documentation

## 2022-06-21 DIAGNOSIS — I5032 Chronic diastolic (congestive) heart failure: Secondary | ICD-10-CM

## 2022-06-21 DIAGNOSIS — I10 Essential (primary) hypertension: Secondary | ICD-10-CM

## 2022-06-21 DIAGNOSIS — Z6841 Body Mass Index (BMI) 40.0 and over, adult: Secondary | ICD-10-CM | POA: Insufficient documentation

## 2022-06-21 DIAGNOSIS — Z7984 Long term (current) use of oral hypoglycemic drugs: Secondary | ICD-10-CM | POA: Insufficient documentation

## 2022-06-21 DIAGNOSIS — I2510 Atherosclerotic heart disease of native coronary artery without angina pectoris: Secondary | ICD-10-CM | POA: Insufficient documentation

## 2022-06-21 DIAGNOSIS — Z794 Long term (current) use of insulin: Secondary | ICD-10-CM | POA: Insufficient documentation

## 2022-06-21 LAB — BASIC METABOLIC PANEL
Anion gap: 6 (ref 5–15)
BUN: 8 mg/dL (ref 6–20)
CO2: 25 mmol/L (ref 22–32)
Calcium: 9.2 mg/dL (ref 8.9–10.3)
Chloride: 107 mmol/L (ref 98–111)
Creatinine, Ser: 0.69 mg/dL (ref 0.61–1.24)
GFR, Estimated: >60 mL/min (ref >60)
Glucose, Bld: 83 mg/dL (ref 70–99)
Potassium: 4.7 mmol/L (ref 3.5–5.1)
Sodium: 138 mmol/L (ref 135–145)

## 2022-06-21 LAB — BRAIN NATRIURETIC PEPTIDE: B Natriuretic Peptide: 19.4 pg/mL (ref 0.0–100.0)

## 2022-06-21 NOTE — Progress Notes (Signed)
[place] HF CLINIC NOTE  Referring Physician: Dr. Garen Lah Primary Care: Vidal Schwalbe, MD Primary Cardiologist: Dr. Garen Lah   HPI:  [name] Gregory is a 51 y.o. male with a history of HTN, T2DM, tobacco abuse, obesity, OSA (on CPAP) and HFrEF referred by Dr. Garen Lah for further evaluation of his GF.   Has multiple family members with HF on his father's side. Developed HF symptoms in Jan/Feb 2023. He had a prior TTE in 2020 that showed moderate concentric hypertrophy with normal EF. He was seen by Dr. Gennette Pac at [facility] 11/03/21 nd underwent echo and stress test. 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 LVEDP was elevated at 23mHg. Echo same day showed EF 20-25%, LV dilation, hypokinesis of the apical anterior, apical, apical inferior and apical septal segments, and grade III diastolic dysfunction. He also complained that his heart was fluttering, lasts for several seconds. Given this a Ziopatch was placed which was unrevealing.   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 Referred to genetics for further discussion    [facility] HF meds have been titrated. Recently had to cut back Entresto due to low BP.     Echo 8/23 @ [facility] EF 55%  cMRI: 11/23: EF 52% RV normal. No LGE.  I saw him 2 weeks ago for initial HF consultation. EF had recovered but still having significant HF symptoms. ReDS 38%. cMRI obtained and was normal (see above). Pending CPX study   Brought in today for acute visit due to progressive dyspnea and weight gain. Here with his wife. Says he has gained 10 pounds in last week. SOB with any exertion. Increased torsemide from 40 bid to 60 bid. Didn't see much benefit.  No change in weight. Diet stable. No orthopnea  or PND  ReDs today 36%     Past Medical History:  Diagnosis Date   CHF (congestive heart failure) (HCC)    Complication of anesthesia    WOKE UP DURING SURGERY   Diabetes (HCC)    GERD (gastroesophageal reflux disease)    Hemangioma of liver    Hypertension    Sleep apnea    URI (upper respiratory infection)     Current Outpatient Medications  Medication Sig Dispense Refill   atorvastatin (LIPITOR) 20 MG tablet Take 1 tablet (20 mg total) by mouth daily. 90 tablet 3   buPROPion (ZYBAN) 150 MG 12 hr tablet Take 150 mg by mouth 2 (two) times daily.     carvedilol (COREG) 25 MG tablet Take 1 tablet (25 mg total) by mouth 2 (two) times daily with a meal. 180 tablet 3   citalopram (CELEXA) 20 MG tablet Take 1 tablet by mouth daily.     gabapentin (NEURONTIN) 300 MG capsule Take 300 mg by mouth 2 (two) times daily. Take 2 capsule  in AM and 1 capsule q PM for nerve pain     glipiZIDE (GLUCOTROL) 5 MG tablet Take 2.5 mg by mouth 2 (two) times daily before a meal.     JARDIANCE 10 MG TABS tablet Take 1 tablet (10 mg total) by mouth daily. 90 tablet 3   LORazepam (ATIVAN) 0.5 MG tablet Take 0.5 mg by mouth every 8 (eight) hours as needed for anxiety.      magnesium oxide (MAG-OX) 400 (240  Mg) MG tablet Take 400 mg by mouth daily.     metFORMIN (GLUCOPHAGE) 1000 MG tablet Take 1,000 mg by mouth 2 (two) times daily with a meal.     omeprazole (PRILOSEC) 20 MG capsule Take 20 mg by mouth daily.     sacubitril-valsartan (ENTRESTO) 49-51 MG Take 1 tablet by mouth 2 (two) times daily. 180 tablet 3   Semaglutide,0.25 or 0.'5MG'$ /DOS, 2 MG/3ML SOPN Inject 1 mg into the skin once a week. 3 mL 1   spironolactone (ALDACTONE) 25 MG tablet Take 25 mg by mouth daily.     torsemide (DEMADEX) 20 MG tablet Take 2 tablets (40 mg total) by mouth 2 (two) times daily. Take extra tab if needed 135 tablet 6   Vitamin D, Ergocalciferol, (DRISDOL) 50000 units CAPS capsule Take 1 capsule by mouth 3 (three) times a  week.     No current facility-administered medications for this visit.    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[employer]  Tobacco Use   Smoking status: Former    Packs/day: 0.25    Years: 36.00    Total pack years: 9.00    Types: Cigarettes    Quit date: 11/18/2021    Years since quitting: 0.5   Smokeless tobacco: Never   Tobacco comments:    QUIT SMOKING IN APRIL  Vaping Use   Vaping Use: Every day  Substance and Sexual Activity   Alcohol use: Yes    Comment: maybe once a month   Drug use: No   Sexual activity: Not on file  Other Topics Concern   Not on file  Social History Narrative   Not on file   Social Determinants of Health   Financial Resource Strain: Unknown (07/17/2019)   Overall Financial Resource Strain (CARDIA)    Difficulty of Paying Living Expenses: Patient refused  Food Insecurity: Unknown (07/17/2019)   Hunger Vital Sign    Worried About Running Out of Food in the Last Year: Patient refused    [place] in the Last Year: Patient refused  Transportation Needs: Unknown (07/17/2019)   PRAPARE - Hydrologist (Medical): Patient refused    Lack of Transportation (Non-Medical): Patient refused  Physical Activity: Unknown (07/17/2019)   Exercise Vital Sign    Days of Exercise per Week: Patient refused    Minutes of Exercise per Session: Patient refused  Stress: Not on file  Social Connections: Unknown (07/17/2019)   Social Connection and Isolation Panel [NHANES]    Frequency of Communication with Friends and Family: Patient refused    Frequency of Social Gatherings with Friends and Family: Patient refused    Attends Religious Services: Patient refused    Active Member of Clubs or  Organizations: Patient refused    Attends Archivist Meetings: Patient refused    Marital Status: Patient refused  Intimate Partner Violence: Unknown (07/17/2019)   Humiliation, Afraid, Rape, and Kick questionnaire    Fear of Current or Ex-Partner: Patient refused    Emotionally Abused: Patient refused    Physically Abused: Patient refused    Sexually Abused: Patient refused      Family History  Problem Relation Age of  Onset   Hypertension Mother    CVA Father    Diabetes Father    Throat cancer Father    Heart disease Brother 67   Cervical cancer Maternal Grandmother    Throat cancer Maternal Grandmother    Throat cancer Maternal Grandfather    Bladder Cancer Maternal Grandfather    Cirrhosis Maternal Grandfather    Colon cancer Neg Hx     Vitals:   06/21/22 1201  BP: (!) 126/92  Pulse: 93  SpO2: 100%  Weight: (!) 318 lb 4 [name] (144.4 kg)   Wt Readings from Last 3 Encounters:  06/21/22 (!) 318 lb 4 [name] (144.4 kg)  06/07/22 (!) 310 lb 2 [name] (140.7 kg)  04/14/22 (!) 309 lb (140.2 kg)     PHYSICAL EXAM: General:  Obese male wearing O2 . No resp difficulty at rest HEENT: normal Neck: supple. No obvious JVD. Carotids 2+ bilat; no bruits. No lymphadenopathy or thryomegaly appreciated. Cor: PMI nondisplaced. Regular rate & rhythm. No rubs, gallops or murmurs. Lungs: clear Abdomen: obese soft, nontender, nondistended. No hepatosplenomegaly. No bruits or masses. Good bowel sounds. Extremities: no cyanosis, clubbing, rash, trace edema Neuro: alert & orientedx3, cranial nerves grossly intact. moves all 4 extremities w/o difficulty. Affect pleasant  ASSESSMENT & PLAN:   1. 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 EYA4 is  associated with autosomal dominant deafness with or without dilated cardiomyopathy. Referred to genetics for further discussion - SPEP/IFE showed no monoclonal protein - Echo 4/24 Ef 20-25% - Cath 4/23 non-obstructive CAD - Echo 8/23 [facility]) with recovered LVEF 55%, LVIDd ~5.5, normal RV, RVSP ~29 - cMRI: 11/23: EF 52% RV normal. No LGE. - Despite recovered EF still with NYHA III-IIIb symptoms - Weight is up 10 pounds but ReDS coming down  - He remains markedly symptomatic despite normalization of EF and improving ReDS with need RHC and CPX testing to further elucidate. Given struggles with managing his volume status would recommend Cardiomems placement at time of RHC to help manage more closely and decrease risk of further hospitalizations.  - CXR today as well  - Continue Jardiance 10  - Continue Entresto 49/51 bid - Continue Spiro 25 daily - With recovery of EF unclear how much of a role genetics is playing here but will certainly need to follow EF closely. cMRI normal as above.   2. Essential hypertension - BP well controlled.  3. DM2 - Followed by PCP - continue SGLT2i  4. Palpitations - Zio Apr 2023, no significant arrhythmias, rare ventricular ectopy  5. Morbid obesity - Body mass index is 40.86 kg/m. - continue ozempic  6. Chronic Hypoxic Respiratory Failure: - Per patient, was dropping O2 sat with exertion and qualified for home O2 - Unclear why he's requiring O2. Has been evaluted by Pulmonology (Dr. Elsworth Soho) - No decline in sat with 6MW - Pending CPX. Suspect he may no longer need supplemental O2.   Glori Bickers, MD  12:17 PM

## 2022-06-21 NOTE — H&P (View-Only) (Signed)
[facility] NOTE  Referring Physician: Dr. Garen Lah Primary Care: Vidal Schwalbe, MD Primary Cardiologist: Dr. Garen Lah   HPI:  [name] Gregory is a 51 y.o. male with a history of HTN, T2DM, tobacco abuse, obesity, OSA (on CPAP) and HFrEF referred by Dr. Garen Lah for further evaluation of his GF.   Has multiple family members with HF on his father's side. Developed HF symptoms in Jan/Feb 2023. He had a prior TTE in 2020 that showed moderate concentric hypertrophy with normal EF. He was seen by Dr. Gennette Pac at [facility] 11/03/21 nd underwent echo and stress test. 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 LVEDP was elevated at 80mHg. Echo same day showed EF 20-25%, LV dilation, hypokinesis of the apical anterior, apical, apical inferior and apical septal segments, and grade III diastolic dysfunction. He also complained that his heart was fluttering, lasts for several seconds. Given this a Ziopatch was placed which was unrevealing.   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 Referred to genetics for further discussion    [facility] Lah[facility] HF meds have been titrated. Recently had to cut back Entresto due to low BP.     Echo 8/23 @ [facility] EF 55%  cMRI: 11/23: EF 52% RV normal. No LGE.  I saw him 2 weeks ago for initial HF consultation. EF had recovered but still having significant HF symptoms. ReDS 38%. cMRI obtained and was normal (see above). Pending CPX study   Brought in today for acute visit due to progressive dyspnea and weight gain. Here with his wife. Says he has gained 10 pounds in last week. SOB with any exertion. Increased torsemide from 40 bid to 60 bid. Didn't see much benefit.  No change in weight. Diet stable. No orthopnea  or PND  ReDs today 36%     Past Medical History:  Diagnosis Date   CHF (congestive heart failure) (HCC)    Complication of anesthesia    WOKE UP DURING SURGERY   Diabetes (HCC)    GERD (gastroesophageal reflux disease)    Hemangioma of liver    Hypertension    Sleep apnea    URI (upper respiratory infection)     Current Outpatient Medications  Medication Sig Dispense Refill   atorvastatin (LIPITOR) 20 MG tablet Take 1 tablet (20 mg total) by mouth daily. 90 tablet 3   buPROPion (ZYBAN) 150 MG 12 hr tablet Take 150 mg by mouth 2 (two) times daily.     carvedilol (COREG) 25 MG tablet Take 1 tablet (25 mg total) by mouth 2 (two) times daily with a meal. 180 tablet 3   citalopram (CELEXA) 20 MG tablet Take 1 tablet by mouth daily.     gabapentin (NEURONTIN) 300 MG capsule Take 300 mg by mouth 2 (two) times daily. Take 2 capsule  in AM and 1 capsule q PM for nerve pain     glipiZIDE (GLUCOTROL) 5 MG tablet Take 2.5 mg by mouth 2 (two) times daily before a meal.     JARDIANCE 10 MG TABS tablet Take 1 tablet (10 mg total) by mouth daily. 90 tablet 3   LORazepam (ATIVAN) 0.5 MG tablet Take 0.5 mg by mouth every 8 (eight) hours as needed for anxiety.      magnesium oxide (MAG-OX) 400 (240  Mg) MG tablet Take 400 mg by mouth daily.     metFORMIN (GLUCOPHAGE) 1000 MG tablet Take 1,000 mg by mouth 2 (two) times daily with a meal.     omeprazole (PRILOSEC) 20 MG capsule Take 20 mg by mouth daily.     sacubitril-valsartan (ENTRESTO) 49-51 MG Take 1 tablet by mouth 2 (two) times daily. 180 tablet 3   Semaglutide,0.25 or 0.'5MG'$ /DOS, 2 MG/3ML SOPN Inject 1 mg into the skin once a week. 3 mL 1   spironolactone (ALDACTONE) 25 MG tablet Take 25 mg by mouth daily.     torsemide (DEMADEX) 20 MG tablet Take 2 tablets (40 mg total) by mouth 2 (two) times daily. Take extra tab if needed 135 tablet 6   Vitamin D, Ergocalciferol, (DRISDOL) 50000 units CAPS capsule Take 1 capsule by mouth 3 (three) times a  week.     No current facility-administered medications for this visit.    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[employer]  Tobacco Use   Smoking status: Former    Packs/day: 0.25    Years: 36.00    Total pack years: 9.00    Types: Cigarettes    Quit date: 11/18/2021    Years since quitting: 0.5   Smokeless tobacco: Never   Tobacco comments:    QUIT SMOKING IN APRIL  Vaping Use   Vaping Use: Every day  Substance and Sexual Activity   Alcohol use: Yes    Comment: maybe once a month   Drug use: No   Sexual activity: Not on file  Other Topics Concern   Not on file  Social History Narrative   Not on file   Social Determinants of Health   Financial Resource Strain: Unknown (07/17/2019)   Overall Financial Resource Strain (CARDIA)    Difficulty of Paying Living Expenses: Patient refused  Food Insecurity: Unknown (07/17/2019)   Hunger Vital Sign    Worried About Running Out of Food in the Last Year: Patient refused    [place] in the Last Year: Patient refused  Transportation Needs: Unknown (07/17/2019)   PRAPARE - Hydrologist (Medical): Patient refused    Lack of Transportation (Non-Medical): Patient refused  Physical Activity: Unknown (07/17/2019)   Exercise Vital Sign    Days of Exercise per Week: Patient refused    Minutes of Exercise per Session: Patient refused  Stress: Not on file  Social Connections: Unknown (07/17/2019)   Social Connection and Isolation Panel [NHANES]    Frequency of Communication with Friends and Family: Patient refused    Frequency of Social Gatherings with Friends and Family: Patient refused    Attends Religious Services: Patient refused    Active Member of Clubs or  Organizations: Patient refused    Attends Archivist Meetings: Patient refused    Marital Status: Patient refused  Intimate Partner Violence: Unknown (07/17/2019)   Humiliation, Afraid, Rape, and Kick questionnaire    Fear of Current or Ex-Partner: Patient refused    Emotionally Abused: Patient refused    Physically Abused: Patient refused    Sexually Abused: Patient refused      Family History  Problem Relation Age of  Onset   Hypertension Mother    CVA Father    Diabetes Father    Throat cancer Father    Heart disease Brother 69   Cervical cancer Maternal Grandmother    Throat cancer Maternal Grandmother    Throat cancer Maternal Grandfather    Bladder Cancer Maternal Grandfather    Cirrhosis Maternal Grandfather    Colon cancer Neg Hx     Vitals:   06/21/22 1201  BP: (!) 126/92  Pulse: 93  SpO2: 100%  Weight: (!) 318 lb 4 oz (144.4 kg)   Wt Readings from Last 3 Encounters:  06/21/22 (!) 318 lb 4 oz (144.4 kg)  06/07/22 (!) 310 lb 2 oz (140.7 kg)  04/14/22 (!) 309 lb (140.2 kg)     PHYSICAL EXAM: General:  Obese male wearing O2 . No resp difficulty at rest HEENT: normal Neck: supple. No obvious JVD. Carotids 2+ bilat; no bruits. No lymphadenopathy or thryomegaly appreciated. Cor: PMI nondisplaced. Regular rate & rhythm. No rubs, gallops or murmurs. Lungs: clear Abdomen: obese soft, nontender, nondistended. No hepatosplenomegaly. No bruits or masses. Good bowel sounds. Extremities: no cyanosis, clubbing, rash, trace edema Neuro: alert & orientedx3, cranial nerves grossly intact. moves all 4 extremities w/o difficulty. Affect pleasant  ASSESSMENT & PLAN:   1. 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 EYA4 is  associated with autosomal dominant deafness with or without dilated cardiomyopathy. Referred to genetics for further discussion - SPEP/IFE showed no monoclonal protein - Echo 4/24 Ef 20-25% - Cath 4/23 non-obstructive CAD - Echo 8/23 [facility]) with recovered LVEF 55%, LVIDd ~5.5, normal RV, RVSP ~29 - cMRI: 11/23: EF 52% RV normal. No LGE. - Despite recovered EF still with NYHA III-IIIb symptoms - Weight is up 10 pounds but ReDS coming down  - He remains markedly symptomatic despite normalization of EF and improving ReDS with need RHC and CPX testing to further elucidate. Given struggles with managing his volume status would recommend Cardiomems placement at time of RHC to help manage more closely and decrease risk of further hospitalizations.  - CXR today as well  - Continue Jardiance 10  - Continue Entresto 49/51 bid - Continue Spiro 25 daily - With recovery of EF unclear how much of a role genetics is playing here but will certainly need to follow EF closely. cMRI normal as above.   2. Essential hypertension - BP well controlled.  3. DM2 - Followed by PCP - continue SGLT2i  4. Palpitations - Zio Apr 2023, no significant arrhythmias, rare ventricular ectopy  5. Morbid obesity - Body mass index is 40.86 kg/m. - continue ozempic  6. Chronic Hypoxic Respiratory Failure: - Per patient, was dropping O2 sat with exertion and qualified for home O2 - Unclear why he's requiring O2. Has been evaluted by Pulmonology (Dr. Elsworth Soho) - No decline in sat with 6MW - Pending CPX. Suspect he may no longer need supplemental O2.   Glori Bickers, MD  12:17 PM

## 2022-06-21 NOTE — Patient Instructions (Signed)
Medication Changes:  None, continue current medications  Lab Work:  Labs done today, your results will be available in MyChart, we will contact you for abnormal readings.  Testing/Procedures:  A chest x-ray takes a picture of the organs and structures inside the chest, including the heart, lungs, and blood vessels. This test can show several things, including, whether the heart is enlarges; whether fluid is building up in the lungs; and whether pacemaker / defibrillator leads are still in place.  Heart Cath next week, we will call you with date and time, instructions listed below  Referrals:  none  Special Instructions // Education:  Do the following things EVERYDAY: Weigh yourself in the morning before breakfast. Write it down and keep it in a log. Take your medicines as prescribed Eat low salt foods--Limit salt (sodium) to 2000 mg per day.  Stay as active as you can everyday Limit all fluids for the day to less than 2 liters   Follow-Up in: 3 weeks   If you have any questions or concerns before your next appointment please send [place] a message through mychart or call our office at [phone]     CATHETERIZATION INSTRUCTIONS:  You are scheduled for a Cardiac Catheterization on ???, November ??? with Dr. Glori Bickers.  1. WE WILL CALL YOU AND LET YOU KNOW IF THIS IS SCHEDULED FOR [facility] OR [facility], AND THE DATE AND TIME Special note: Every effort is made to have your procedure done on time. Please understand that emergencies sometimes delay scheduled procedures.  2. Diet: Do not eat solid foods after midnight.  The patient may have clear liquids until 5am upon the day of the procedure.  3. Labs: DONE TODAY  4. Medication instructions in preparation for your procedure:   HOLD OZEMPIC UNTIL AFTER PROCEDURE   AM OF PROCEDURE DO NOT TAKE: Glipizide, Jardiance, Metformin, Spironolactone, and Torsemide  On the morning of your procedure, take any morning medicines  NOT listed above.  You may use sips of water.  5. Plan for one night stay--bring personal belongings. 6. Bring a current list of your medications and current insurance cards. 7. You MUST have a responsible person to drive you home. 8. Someone MUST be with you the first 24 hours after you arrive home or your discharge will be delayed. 9. Please wear clothes that are easy to get on and off and wear slip-on shoes.  Thank you for allowing [place] to care for you!   -- [facility]

## 2022-06-21 NOTE — Progress Notes (Signed)
ReDS Vest / Clip - 06/21/22 1201       ReDS Vest / Clip   Station Marker D    Ruler Value 36    ReDS Value Range Moderate volume overload    ReDS Actual Value 36

## 2022-06-23 ENCOUNTER — Other Ambulatory Visit: Payer: Self-pay | Admitting: *Deleted

## 2022-06-23 ENCOUNTER — Telehealth (HOSPITAL_COMMUNITY): Payer: Self-pay | Admitting: *Deleted

## 2022-06-23 DIAGNOSIS — I5022 Chronic systolic (congestive) heart failure: Secondary | ICD-10-CM

## 2022-06-23 DIAGNOSIS — I5032 Chronic diastolic (congestive) heart failure: Secondary | ICD-10-CM

## 2022-06-23 NOTE — Telephone Encounter (Signed)
No pre cert reqd for Right heart cath.

## 2022-06-23 NOTE — Telephone Encounter (Signed)
Cardiomems PA submitted, however will take time to approve. Pt sch for RHC at [facility] on 11/15 at 9:30 with Dr Haroldine Laws  Pt is aware to arrive to [place] at 8:30 AM, instructions reviewed

## 2022-06-28 ENCOUNTER — Ambulatory Visit: Payer: BC Managed Care – PPO | Admitting: Internal Medicine

## 2022-06-28 ENCOUNTER — Other Ambulatory Visit: Payer: Self-pay

## 2022-06-28 ENCOUNTER — Encounter
Admission: RE | Disposition: A | Discharge status: Home / Self Care | Payer: Self-pay | Source: Ambulatory Visit | Attending: Internal Medicine

## 2022-06-28 ENCOUNTER — Ambulatory Visit
Admission: RE | Admit: 2022-06-28 | Discharge: 2022-06-28 | Disposition: A | Discharge status: Home / Self Care | Payer: BC Managed Care – PPO | Source: Ambulatory Visit | Attending: Internal Medicine | Admitting: Internal Medicine

## 2022-06-28 ENCOUNTER — Encounter: Payer: Self-pay | Admitting: Internal Medicine

## 2022-06-28 ENCOUNTER — Ambulatory Visit: Payer: Self-pay | Admitting: Family

## 2022-06-28 DIAGNOSIS — Z87891 Personal history of nicotine dependence: Secondary | ICD-10-CM | POA: Diagnosis not present

## 2022-06-28 DIAGNOSIS — Z79899 Other long term (current) drug therapy: Secondary | ICD-10-CM | POA: Diagnosis not present

## 2022-06-28 DIAGNOSIS — R002 Palpitations: Secondary | ICD-10-CM | POA: Insufficient documentation

## 2022-06-28 DIAGNOSIS — Z7985 Long-term (current) use of injectable non-insulin antidiabetic drugs: Secondary | ICD-10-CM | POA: Insufficient documentation

## 2022-06-28 DIAGNOSIS — I110 Hypertensive heart disease with heart failure: Secondary | ICD-10-CM | POA: Diagnosis not present

## 2022-06-28 DIAGNOSIS — R748 Abnormal levels of other serum enzymes: Secondary | ICD-10-CM

## 2022-06-28 DIAGNOSIS — E119 Type 2 diabetes mellitus without complications: Secondary | ICD-10-CM | POA: Insufficient documentation

## 2022-06-28 DIAGNOSIS — J9611 Chronic respiratory failure with hypoxia: Secondary | ICD-10-CM | POA: Insufficient documentation

## 2022-06-28 DIAGNOSIS — K760 Fatty (change of) liver, not elsewhere classified: Secondary | ICD-10-CM

## 2022-06-28 DIAGNOSIS — I5022 Chronic systolic (congestive) heart failure: Secondary | ICD-10-CM | POA: Insufficient documentation

## 2022-06-28 DIAGNOSIS — I5032 Chronic diastolic (congestive) heart failure: Secondary | ICD-10-CM

## 2022-06-28 DIAGNOSIS — G4733 Obstructive sleep apnea (adult) (pediatric): Secondary | ICD-10-CM | POA: Diagnosis not present

## 2022-06-28 DIAGNOSIS — I509 Heart failure, unspecified: Secondary | ICD-10-CM

## 2022-06-28 DIAGNOSIS — R7989 Other specified abnormal findings of blood chemistry: Secondary | ICD-10-CM

## 2022-06-28 DIAGNOSIS — Z6841 Body Mass Index (BMI) 40.0 and over, adult: Secondary | ICD-10-CM | POA: Insufficient documentation

## 2022-06-28 HISTORY — PX: RIGHT HEART CATH: CATH118263

## 2022-06-28 LAB — CBC
HCT: 33.1 % — ABNORMAL LOW (ref 39.0–52.0)
Hemoglobin: 10.4 g/dL — ABNORMAL LOW (ref 13.0–17.0)
MCH: 23.5 pg — ABNORMAL LOW (ref 26.0–34.0)
MCHC: 31.4 g/dL (ref 30.0–36.0)
MCV: 74.7 fL — ABNORMAL LOW (ref 80.0–100.0)
Platelets: 294 10*3/uL (ref 150–400)
RBC: 4.43 MIL/uL (ref 4.22–5.81)
RDW: 14.6 % (ref 11.5–15.5)
WBC: 10.5 10*3/uL (ref 4.0–10.5)
nRBC: 0 % (ref 0.0–0.2)

## 2022-06-28 LAB — POCT I-STAT EG7
Acid-base deficit: 2 mmol/L (ref 0.0–2.0)
Acid-base deficit: 4 mmol/L — ABNORMAL HIGH (ref 0.0–2.0)
Bicarbonate: 20.4 mmol/L (ref 20.0–28.0)
Bicarbonate: 22.8 mmol/L (ref 20.0–28.0)
Calcium, Ion: 1.05 mmol/L — ABNORMAL LOW (ref 1.15–1.40)
Calcium, Ion: 1.21 mmol/L (ref 1.15–1.40)
HCT: 29 % — ABNORMAL LOW (ref 39.0–52.0)
HCT: 31 % — ABNORMAL LOW (ref 39.0–52.0)
Hemoglobin: 10.5 g/dL — ABNORMAL LOW (ref 13.0–17.0)
Hemoglobin: 9.9 g/dL — ABNORMAL LOW (ref 13.0–17.0)
O2 Saturation: 69 %
O2 Saturation: 78 %
Potassium: 3.5 mmol/L (ref 3.5–5.1)
Potassium: 3.8 mmol/L (ref 3.5–5.1)
Sodium: 139 mmol/L (ref 135–145)
Sodium: 141 mmol/L (ref 135–145)
TCO2: 21 mmol/L — ABNORMAL LOW (ref 22–32)
TCO2: 24 mmol/L (ref 22–32)
pCO2, Ven: 35.5 mmHg — ABNORMAL LOW (ref 44–60)
pCO2, Ven: 39.9 mmHg — ABNORMAL LOW (ref 44–60)
pH, Ven: 7.365 (ref 7.25–7.43)
pH, Ven: 7.368 (ref 7.25–7.43)
pO2, Ven: 37 mmHg (ref 32–45)
pO2, Ven: 44 mmHg (ref 32–45)

## 2022-06-28 LAB — GLUCOSE, CAPILLARY
Glucose-Capillary: 130 mg/dL — ABNORMAL HIGH (ref 70–99)
Glucose-Capillary: 125 mg/dL — ABNORMAL HIGH (ref 70–99)

## 2022-06-28 SURGERY — RIGHT HEART CATH
Anesthesia: Moderate Sedation | Laterality: Right

## 2022-06-28 MED ORDER — SODIUM CHLORIDE 0.9 % IV SOLN: INTRAVENOUS | Status: DC

## 2022-06-28 MED ORDER — SODIUM CHLORIDE 0.9 % IV SOLN: 250.0000 mL | INTRAVENOUS | Status: DC | PRN

## 2022-06-28 MED ORDER — HEPARIN (PORCINE) IN NACL 1000-0.9 UT/500ML-% IV SOLN
INTRAVENOUS | Status: AC
  Filled 2022-06-28: qty 1000

## 2022-06-28 MED ORDER — ONDANSETRON HCL 4 MG/2ML IJ SOLN: 4.0000 mg | Freq: Four times a day (QID) | INTRAMUSCULAR | Status: DC | PRN

## 2022-06-28 MED ORDER — VERAPAMIL HCL 2.5 MG/ML IV SOLN
INTRAVENOUS | Status: AC
  Filled 2022-06-28: qty 2

## 2022-06-28 MED ORDER — SODIUM CHLORIDE 0.9% FLUSH: 3.0000 mL | INTRAVENOUS | Status: DC | PRN

## 2022-06-28 MED ORDER — HYDRALAZINE HCL 20 MG/ML IJ SOLN: 10.0000 mg | INTRAMUSCULAR | Status: DC | PRN

## 2022-06-28 MED ORDER — HEPARIN (PORCINE) IN NACL 1000-0.9 UT/500ML-% IV SOLN
INTRAVENOUS | Status: DC | PRN
  Administered 2022-06-28 (×2): 500 mL

## 2022-06-28 MED ORDER — SODIUM CHLORIDE 0.9% FLUSH: 3.0000 mL | Freq: Two times a day (BID) | INTRAVENOUS | Status: DC

## 2022-06-28 MED ORDER — MIDAZOLAM HCL 2 MG/2ML IJ SOLN
INTRAMUSCULAR | Status: DC | PRN
  Administered 2022-06-28: 2 mg via INTRAVENOUS

## 2022-06-28 MED ORDER — LIDOCAINE HCL 1 % IJ SOLN
INTRAMUSCULAR | Status: AC
  Filled 2022-06-28: qty 20

## 2022-06-28 MED ORDER — ACETAMINOPHEN 325 MG PO TABS: 650.0000 mg | ORAL_TABLET | ORAL | Status: DC | PRN

## 2022-06-28 MED ORDER — FENTANYL CITRATE (PF) 100 MCG/2ML IJ SOLN
INTRAMUSCULAR | Status: AC
  Filled 2022-06-28: qty 2

## 2022-06-28 MED ORDER — ASPIRIN 81 MG PO CHEW
81.0000 mg | CHEWABLE_TABLET | ORAL | Status: AC
  Administered 2022-06-28: 81 mg via ORAL

## 2022-06-28 MED ORDER — HEPARIN SODIUM (PORCINE) 1000 UNIT/ML IJ SOLN
INTRAMUSCULAR | Status: AC
  Filled 2022-06-28: qty 10

## 2022-06-28 MED ORDER — LABETALOL HCL 5 MG/ML IV SOLN: 10.0000 mg | INTRAVENOUS | Status: DC | PRN

## 2022-06-28 MED ORDER — FENTANYL CITRATE (PF) 100 MCG/2ML IJ SOLN
INTRAMUSCULAR | Status: DC | PRN
  Administered 2022-06-28: 25 ug via INTRAVENOUS

## 2022-06-28 MED ORDER — MIDAZOLAM HCL 2 MG/2ML IJ SOLN
INTRAMUSCULAR | Status: AC
  Filled 2022-06-28: qty 2

## 2022-06-28 MED ORDER — LIDOCAINE HCL (PF) 1 % IJ SOLN
INTRAMUSCULAR | Status: DC | PRN
  Administered 2022-06-28: 2 mL

## 2022-06-28 MED ORDER — ASPIRIN 81 MG PO CHEW
CHEWABLE_TABLET | ORAL | Status: AC
  Filled 2022-06-28: qty 1

## 2022-06-28 SURGICAL SUPPLY — 9 items
CATH BALLN WEDGE 5F 110CM (CATHETERS) IMPLANT
DRAPE BRACHIAL (DRAPES) IMPLANT
KIT RIGHT HEART ACIST (MISCELLANEOUS) IMPLANT
KIT SYRINGE INJ CVI SPIKEX1 (MISCELLANEOUS) IMPLANT
PACK ANGIOGRAPHY (CUSTOM PROCEDURE TRAY) IMPLANT
PROTECTION STATION PRESSURIZED (MISCELLANEOUS) ×1
SET ATX SIMPLICITY (MISCELLANEOUS) IMPLANT
SHEATH GLIDE SLENDER 4/5FR (SHEATH) IMPLANT
STATION PROTECTION PRESSURIZED (MISCELLANEOUS) IMPLANT

## 2022-06-28 NOTE — Interval H&P Note (Signed)
History and Physical Interval Note:  06/28/2022 9:58 AM  [name]  has presented today for surgery, with the diagnosis of R Cath   Heart Failure.  The various methods of treatment have been discussed with the patient and family. After consideration of risks, benefits and other options for treatment, the patient has consented to  Procedure(s): RIGHT HEART CATH (Right) as a surgical intervention.  The patient's history has been reviewed, patient examined, no change in status, stable for surgery.  I have reviewed the patient's chart and labs.  Questions were answered to the patient's satisfaction.     Kelty Szafran

## 2022-06-29 ENCOUNTER — Other Ambulatory Visit (HOSPITAL_COMMUNITY): Payer: Self-pay

## 2022-06-29 LAB — POCT I-STAT EG7
Acid-base deficit: 8 mmol/L — ABNORMAL HIGH (ref 0.0–2.0)
Bicarbonate: 17.1 mmol/L — ABNORMAL LOW (ref 20.0–28.0)
Calcium, Ion: 0.75 mmol/L — CL (ref 1.15–1.40)
HCT: 24 % — ABNORMAL LOW (ref 39.0–52.0)
Hemoglobin: 8.2 g/dL — ABNORMAL LOW (ref 13.0–17.0)
O2 Saturation: 71 %
Potassium: 2.6 mmol/L — CL (ref 3.5–5.1)
Sodium: 150 mmol/L — ABNORMAL HIGH (ref 135–145)
TCO2: 18 mmol/L — ABNORMAL LOW (ref 22–32)
pCO2, Ven: 31.1 mmHg — ABNORMAL LOW (ref 44–60)
pH, Ven: 7.349 (ref 7.25–7.43)
pO2, Ven: 39 mmHg (ref 32–45)

## 2022-07-03 ENCOUNTER — Telehealth (HOSPITAL_COMMUNITY): Payer: Self-pay

## 2022-07-03 NOTE — Telephone Encounter (Signed)
Patient aware of results.

## 2022-07-10 ENCOUNTER — Encounter: Payer: Self-pay | Admitting: Nurse Practitioner

## 2022-07-10 ENCOUNTER — Ambulatory Visit (INDEPENDENT_AMBULATORY_CARE_PROVIDER_SITE_OTHER): Payer: BC Managed Care – PPO | Admitting: Nurse Practitioner

## 2022-07-10 VITALS — BP 124/85 | HR 87 | Ht 74.0 in | Wt 318.0 lb

## 2022-07-10 DIAGNOSIS — E119 Type 2 diabetes mellitus without complications: Secondary | ICD-10-CM | POA: Diagnosis not present

## 2022-07-10 LAB — POCT GLYCOSYLATED HEMOGLOBIN (HGB A1C): Hemoglobin A1C: 5.9 % — AB (ref 4.0–5.6)

## 2022-07-10 MED ORDER — SEMAGLUTIDE (1 MG/DOSE) 4 MG/3ML ~~LOC~~ SOPN: 1.0000 mg | PEN_INJECTOR | SUBCUTANEOUS | 3 refills | Status: DC

## 2022-07-10 MED ORDER — METFORMIN HCL 1000 MG PO TABS: 1000.0000 mg | ORAL_TABLET | Freq: Two times a day (BID) | ORAL | 3 refills | Status: DC

## 2022-07-10 NOTE — Patient Instructions (Signed)

## 2022-07-10 NOTE — Progress Notes (Signed)
Endocrinology Consult Note       07/10/2022, 5:42 PM   Subjective:    Patient ID: [name] Gregory, male    DOB: July 15, 1971.  [name] Gregory is being seen in consultation for management of currently uncontrolled symptomatic diabetes requested by  Vidal Schwalbe, MD.   Past Medical History:  Diagnosis Date   CHF (congestive heart failure) ([place])    Complication of anesthesia    WOKE UP DURING SURGERY   Diabetes ([place])    GERD (gastroesophageal reflux disease)    Hemangioma of liver    Hypertension    Sleep apnea    URI (upper respiratory infection)     Past Surgical History:  Procedure Laterality Date   BIOPSY  05/04/2016   Procedure: BIOPSY;  Surgeon: Daneil Dolin, MD;  [facility]: [facility];  Service: Endoscopy;;  gastric bx   CARDIAC CATHETERIZATION Bilateral 10/2021   COLONOSCOPY WITH PROPOFOL N/A 05/04/2016    PROPOFOL;  Surgeon: Daneil Dolin, MD; diverticulosis throughout the entire colon, otherwise normal.  Repeat due in 2027.   ESOPHAGOGASTRODUODENOSCOPY (EGD) WITH PROPOFOL N/A 05/04/2016   PROPOFOL;  Surgeon: Daneil Dolin, MD; LA grade B esophagitis, gastritis, small hiatal hernia.   EYE SURGERY     RIGHT HEART CATH Right 06/28/2022   Procedure: RIGHT HEART CATH;  Surgeon: Jolaine Artist, MD;  Location: [facility];  Service: Cardiovascular;  Laterality: Right;   SALIVARY GLAND SURGERY  1992    Social History   Socioeconomic History   Marital status: Married    Spouse name: Not on file   Number of children: Not on file   Years of education: Not on file   Highest education level: Not on file  Occupational History   Occupation: administrative for [employer]  Tobacco Use   Smoking status: Former    Packs/day: 0.25    Years: 36.00    Total pack years: 9.00    Types: Cigarettes    Quit date: 11/18/2021    Years since quitting: 0.6   Smokeless tobacco: Never   Tobacco  comments:    QUIT SMOKING IN APRIL  Vaping Use   Vaping Use: Every day  Substance and Sexual Activity   Alcohol use: Not Currently    Comment: maybe once a month   Drug use: No   Sexual activity: Not on file  Other Topics Concern   Not on file  Social History Narrative   Not on file   Social Determinants of Health   Financial Resource Strain: Unknown (07/17/2019)   Overall Financial Resource Strain (CARDIA)    Difficulty of Paying Living Expenses: Patient refused  Food Insecurity: Unknown (07/17/2019)   Hunger Vital Sign    Worried About [place] in the Last Year: Patient refused    [place] in the Last Year: Patient refused  Transportation Needs: Unknown (07/17/2019)   PRAPARE - Transportation    Lack of Transportation (Medical): Patient refused    Lack of Transportation (Non-Medical): Patient refused  Physical Activity: Unknown (07/17/2019)   Exercise Vital Sign    Days of Exercise per Week: Patient refused    Minutes  of Exercise per Session: Patient refused  Stress: Not on file  Social Connections: Unknown (07/17/2019)   Social Connection and Isolation Panel [NHANES]    Frequency of Communication with Friends and Family: Patient refused    Frequency of Social Gatherings with Friends and Family: Patient refused    Attends Religious Services: Patient refused    Marine scientist or Organizations: Patient refused    Attends Music therapist: Patient refused    Marital Status: Patient refused    Family History  Problem Relation Age of Onset   Hypertension Mother    CVA Father    Diabetes Father    Throat cancer Father    Heart disease Brother 29   Cervical cancer Maternal Grandmother    Throat cancer Maternal Grandmother    Throat cancer Maternal Grandfather    Bladder Cancer Maternal Grandfather    Cirrhosis Maternal Grandfather    Colon cancer Neg Hx     Outpatient Encounter Medications as of 07/10/2022  Medication Sig    atorvastatin (LIPITOR) 20 MG tablet Take 1 tablet (20 mg total) by mouth daily.   buPROPion (ZYBAN) 150 MG 12 hr tablet Take 150 mg by mouth 2 (two) times daily.   carvedilol (COREG) 25 MG tablet Take 1 tablet (25 mg total) by mouth 2 (two) times daily with a meal.   citalopram (CELEXA) 20 MG tablet Take 1 tablet by mouth daily.   gabapentin (NEURONTIN) 300 MG capsule Take 300 mg by mouth 2 (two) times daily. Take 2 capsule  in AM and 1 capsule q PM for nerve pain   JARDIANCE 10 MG TABS tablet Take 1 tablet (10 mg total) by mouth daily.   LORazepam (ATIVAN) 0.5 MG tablet Take 0.5 mg by mouth every 8 (eight) hours as needed for anxiety.    magnesium oxide (MAG-OX) 400 (240 Mg) MG tablet Take 400 mg by mouth daily.   omeprazole (PRILOSEC) 20 MG capsule Take 20 mg by mouth daily.   sacubitril-valsartan (ENTRESTO) 49-51 MG Take 1 tablet by mouth 2 (two) times daily.   Semaglutide, 1 MG/DOSE, 4 MG/3ML SOPN Inject 1 mg as directed once a week.   spironolactone (ALDACTONE) 25 MG tablet Take 25 mg by mouth daily.   torsemide (DEMADEX) 20 MG tablet Take 2 tablets (40 mg total) by mouth 2 (two) times daily. Take extra tab if needed   Vitamin D, Ergocalciferol, (DRISDOL) 50000 units CAPS capsule Take 1 capsule by mouth 3 (three) times a week.   [DISCONTINUED] glipiZIDE (GLUCOTROL) 5 MG tablet Take 2.5 mg by mouth 2 (two) times daily before a meal.   [DISCONTINUED] metFORMIN (GLUCOPHAGE) 1000 MG tablet Take 1,000 mg by mouth 2 (two) times daily with a meal.   [DISCONTINUED] Semaglutide,0.25 or 0.'5MG'$ /DOS, 2 MG/3ML SOPN Inject 1 mg into the skin once a week.   metFORMIN (GLUCOPHAGE) 1000 MG tablet Take 1 tablet (1,000 mg total) by mouth 2 (two) times daily with a meal.   No facility-administered encounter medications on file as of 07/10/2022.    ALLERGIES: No Known Allergies  VACCINATION STATUS: Immunization History  Administered Date(s) Administered   Influenza Split 05/29/2006, 05/14/2016    Influenza,inj,Quad PF,6+ Mos 06/24/2019, 07/01/2020   Influenza-Unspecified 06/13/2011   Moderna Sars-Covid-2 Vaccination 11/12/2019, 12/10/2019   Pneumococcal-Unspecified 06/13/2011   Td 02/04/1991   Tdap 05/10/1996    Diabetes He presents for his initial diabetic visit. He has type 2 diabetes mellitus. Onset time: Diagnosed about 8 months ago at age  of 50. His disease course has been fluctuating. Hypoglycemia symptoms include nervousness/anxiousness, sweats and tremors. Associated symptoms include fatigue, foot paresthesias and polyuria. Hypoglycemia complications include nocturnal hypoglycemia. Diabetic complications include heart disease (CHF), nephropathy and peripheral neuropathy. Risk factors for coronary artery disease include diabetes mellitus, family history, male sex, obesity, hypertension and sedentary lifestyle. Current diabetic treatment includes oral agent (triple therapy) (and Ozempic 1 mg). He is compliant with treatment most of the time. His weight is fluctuating minimally. He is following a generally healthy diet. Meal planning includes avoidance of concentrated sweets. He has not had a previous visit with a dietitian. He rarely participates in exercise. His home blood glucose trend is decreasing steadily. (He presents today for his consultation with his meter showing mostly at target glycemic profile.  His POCT A1c today is 5.9%, improving from last A1c on 6%.  He monitors glucose twice daily on average.  He drinks mostly water, some diet tea.  He eats a light breakfast and lunch and a larger supper.  He does not snack between.  His exercise is limited due to CHF (SOB on oxygen).  He is UTD on eye exam, has never seen podiatry in the past.  He has had a low blood glucose in the 40s to which he was unable to respond appropriately, required assistance.) An ACE inhibitor/angiotensin II receptor blocker is being taken. He does not see a podiatrist.Eye exam is current.     Review of  systems  Constitutional: + Minimally fluctuating body weight, current Body mass index is 40.83 kg/m., no fatigue, no subjective hyperthermia, no subjective hypothermia Eyes: no blurry vision, no xerophthalmia ENT: no sore throat, no nodules palpated in throat, no dysphagia/odynophagia, no hoarseness Cardiovascular: no chest pain, no shortness of breath, no palpitations, no leg swelling Respiratory: no cough, no shortness of breath Gastrointestinal: no nausea/vomiting/diarrhea Musculoskeletal: no muscle/joint aches Skin: no rashes, no hyperemia Neurological: no tremors, no numbness, no tingling, no dizziness Psychiatric: no depression, no anxiety  Objective:     BP 124/85 (BP Location: Left Arm, Patient Position: Sitting, Cuff Size: Large)   Pulse 87   Ht '6\' 2"'$  (1.88 m)   Wt (!) 318 lb (144.2 kg)   BMI 40.83 kg/m   Wt Readings from Last 3 Encounters:  07/10/22 (!) 318 lb (144.2 kg)  06/28/22 (!) 317 lb (143.8 kg)  06/21/22 (!) 318 lb 4 oz (144.4 kg)     BP Readings from Last 3 Encounters:  07/10/22 124/85  06/28/22 (!) 138/92  06/21/22 (!) 126/92     Physical Exam- Limited  Constitutional:  Body mass index is 40.83 kg/m. , not in acute distress, normal state of mind Eyes:  EOMI, no exophthalmos Neck: Supple Cardiovascular: RRR, no murmurs, rubs, or gallops, no edema Respiratory: Adequate breathing efforts, no crackles, rales, rhonchi, or wheezing Musculoskeletal: no gross deformities, strength intact in all four extremities, no gross restriction of joint movements Skin:  no rashes, no hyperemia Neurological: no tremor with outstretched hands    CMP ( most recent) CMP     Component Value Date/Time   NA 141 06/28/2022 1021   K 3.5 06/28/2022 1021   CL 107 06/21/2022 1312   CO2 25 06/21/2022 1312   GLUCOSE 83 06/21/2022 1312   BUN 8 06/21/2022 1312   BUN 28 (A) 12/29/2021 0000   CREATININE 0.69 06/21/2022 1312   CREATININE 0.72 03/18/2016 1352   CALCIUM 9.2  06/21/2022 1312   PROT 7.5 06/07/2022 1045   ALBUMIN 3.9  06/07/2022 1045   AST 14 (L) 06/07/2022 1045   ALT 17 06/07/2022 1045   ALKPHOS 113 06/07/2022 1045   BILITOT 0.4 06/07/2022 1045   GFRNONAA >60 06/21/2022 1312   GFRNONAA >89 03/18/2016 1352   GFRAA >60 07/18/2019 0430   GFRAA >89 03/18/2016 1352     Diabetic Labs (most recent): Lab Results  Component Value Date   HGBA1C 5.9 (A) 07/10/2022   HGBA1C 6.0 (H) 04/06/2022   HGBA1C 13.2 12/29/2021     Lipid Panel ( most recent) Lipid Panel     Component Value Date/Time   CHOL  12/16/2008 0340    185        ATP III CLASSIFICATION:  <200     mg/dL   Desirable  200-239  mg/dL   Borderline High  >=240    mg/dL   High          TRIG 203 (H) 12/16/2008 0340   HDL 37 (L) 12/16/2008 0340   CHOLHDL 5.0 12/16/2008 0340   VLDL 41 (H) 12/16/2008 0340   LDLCALC (H) 12/16/2008 0340    107        Total Cholesterol/HDL:CHD Risk Coronary Heart Disease Risk Table                     Men   Women  1/2 Average Risk   3.4   3.3  Average Risk       5.0   4.4  2 X Average Risk   9.6   7.1  3 X Average Risk  23.4   11.0        Use the calculated Patient Ratio above and the CHD Risk Table to determine the patient's CHD Risk.        ATP III CLASSIFICATION (LDL):  <100     mg/dL   Optimal  100-129  mg/dL   Near or Above                    Optimal  130-159  mg/dL   Borderline  160-189  mg/dL   High  >190     mg/dL   Very High             Assessment & Plan:   1) Type 2 diabetes mellitus without complication, without long-term current use of insulin ([place])  He presents today for his consultation with his meter showing mostly at target glycemic profile.  His POCT A1c today is 5.9%, improving from last A1c on 6%.  He monitors glucose twice daily on average.  He drinks mostly water, some diet tea.  He eats a light breakfast and lunch and a larger supper.  He does not snack between.  His exercise is limited due to CHF (SOB on  oxygen).  He is UTD on eye exam, has never seen podiatry in the past.  He has had a low blood glucose in the 40s to which he was unable to respond appropriately, required assistance.  [name] Gregory has currently uncontrolled symptomatic type 2 DM since 51 years of age, with most recent A1c of 5.9 %.   -Recent labs reviewed.  - I had a long discussion with him about the progressive nature of diabetes and the pathology behind its complications. -his diabetes is complicated by CHF, neuropathy and he remains at a high risk for more acute and chronic complications which include CAD, CVA, CKD, retinopathy, and neuropathy. These are all discussed in detail with him.  The following Lifestyle Medicine recommendations according to [facility]) were discussed and offered to patient and he agrees to start the journey:  A. Whole Foods, Plant-based plate comprising of fruits and vegetables, plant-based proteins, whole-grain carbohydrates was discussed in detail with the patient.   A list for source of those nutrients were also provided to the patient.  Patient will use only water or unsweetened tea for hydration. B.  The need to stay away from risky substances including alcohol, smoking; obtaining 7 to 9 hours of restorative sleep, at least 150 minutes of moderate intensity exercise weekly, the importance of healthy social connections,  and stress reduction techniques were discussed. C.  A full color page of  Calorie density of various food groups per pound showing examples of each food groups was provided to the patient.  - I have counseled him on diet and weight management by adopting a carbohydrate restricted/protein rich diet. Patient is encouraged to switch to unprocessed or minimally processed complex starch and increased protein intake (animal or plant source), fruits, and vegetables. -  he is advised to stick to a routine mealtimes to eat 3 meals a day and avoid  unnecessary snacks (to snack only to correct hypoglycemia).   - he acknowledges that there is a room for improvement in his food and drink choices. - Suggestion is made for him to avoid simple carbohydrates from his diet including Cakes, Sweet Desserts, Ice Cream, Soda (diet and regular), Sweet Tea, Candies, Chips, Cookies, Store Bought Juices, Alcohol in Excess of 1-2 drinks a day, Artificial Sweeteners, Coffee Creamer, and "Sugar-free" Products. This will help patient to have more stable blood glucose profile and potentially avoid unintended weight gain.  - I have approached him with the following individualized plan to manage his diabetes and patient agrees:   -he is encouraged to start monitoring glucose twice daily, before breakfast and before bed, to log their readings on the clinic sheets provided, and call if glucose is less than 70 or above 300 for 3 tests in a row.  - Adjustment parameters are given to him for hypo and hyperglycemia in writing.  - he is advised to continue Ozempic 1 mg SQ weekly, Jardiance 10 mg po daily and Metformin 1000 mg po twice daily after meals (to help minimize GI SE), therapeutically suitable for patient . - his Glipizide will be discontinued, risk outweighs benefit for this patient, especially given his low of 40.  - Specific targets for  A1c; LDL, HDL, and Triglycerides were discussed with the patient.  2) Blood Pressure /Hypertension:  his blood pressure is controlled to target.   he is advised to continue his current medications including Demadex 40 mg po twice daily, Entresto 49-51 mg po daily, and Coreg 25 mg po twice daily.  3) Lipids/Hyperlipidemia:    Review of his recent lipid panel from 12/26/21 showed controlled LDL at 62 .  he is advised to continue Lipitor 20 mg daily at bedtime.  Side effects and precautions discussed with him.  4)  Weight/Diet:  his Body mass index is 40.83 kg/m.  -  clearly complicating his diabetes care.   he is a  candidate for weight loss. I discussed with him the fact that loss of 5 - 10% of his  current body weight will have the most impact on his diabetes management.  Exercise, and detailed carbohydrates information provided  -  detailed on discharge instructions.  5) Chronic Care/Health Maintenance: -he is on ACEI/ARB  and Statin medications and is encouraged to initiate and continue to follow up with Ophthalmology, Dentist, Podiatrist at least yearly or according to recommendations, and advised to stay away from smoking. I have recommended yearly flu vaccine and pneumonia vaccine at least every 5 years; moderate intensity exercise for up to 150 minutes weekly; and sleep for at least 7 hours a day.  - he is advised to maintain close follow up with Vidal Schwalbe, MD for primary care needs, as well as his other providers for optimal and coordinated care.   - Time spent in this patient care: 60 min, of which > 50% was spent in counseling him about his diabetes and the rest reviewing his blood glucose logs, discussing his hypoglycemia and hyperglycemia episodes, reviewing his current and previous labs/studies (including abstraction from other facilities) and medications doses and developing a long term treatment plan based on the latest standards of care/guidelines; and documenting his care.    Please refer to Patient Instructions for Blood Glucose Monitoring and Insulin/Medications Dosing Guide" in media tab for additional information. Please also refer to "Patient Self Inventory" in the Media tab for reviewed elements of pertinent patient history.  [name] Gregory participated in the discussions, expressed understanding, and voiced agreement with the above plans.  All questions were answered to his satisfaction. he is encouraged to contact clinic should he have any questions or concerns prior to his return visit.     Follow up plan: - Return in about 3 months (around 10/10/2022) for Diabetes F/U with A1c in  office, No previsit labs, Bring meter and logs.    Rayetta Pigg, [facility] [address] [place], [place] [address] Phone: [phone] Fax: [phone]  07/10/2022, 5:42 PM

## 2022-07-11 ENCOUNTER — Telehealth: Payer: Self-pay | Admitting: Pharmacist

## 2022-07-11 ENCOUNTER — Ambulatory Visit: Payer: BC Managed Care – PPO | Admitting: Internal Medicine

## 2022-07-11 NOTE — Telephone Encounter (Signed)
Patient was seen by endocrinology yesterday for initial apt. His glipizide was discontinued which I completely agree with. His Ozempic remains at '1mg'$ . Due to the shortage of the '2mg'$ , I feel this is appropriate. Patient will continue to follow with endocrinology.

## 2022-07-17 ENCOUNTER — Ambulatory Visit (HOSPITAL_BASED_OUTPATIENT_CLINIC_OR_DEPARTMENT_OTHER): Payer: BC Managed Care – PPO | Admitting: Internal Medicine

## 2022-07-17 ENCOUNTER — Encounter: Payer: Self-pay | Admitting: Internal Medicine

## 2022-07-17 ENCOUNTER — Other Ambulatory Visit
Admission: RE | Admit: 2022-07-17 | Discharge: 2022-07-17 | Disposition: A | Discharge status: Home / Self Care | Payer: BC Managed Care – PPO | Source: Ambulatory Visit | Attending: Internal Medicine | Admitting: Internal Medicine

## 2022-07-17 VITALS — BP 148/96 | HR 94 | Resp 20 | Wt 320.0 lb

## 2022-07-17 DIAGNOSIS — I5032 Chronic diastolic (congestive) heart failure: Secondary | ICD-10-CM | POA: Insufficient documentation

## 2022-07-17 DIAGNOSIS — I872 Venous insufficiency (chronic) (peripheral): Secondary | ICD-10-CM | POA: Diagnosis not present

## 2022-07-17 DIAGNOSIS — R140 Abdominal distension (gaseous): Secondary | ICD-10-CM | POA: Diagnosis not present

## 2022-07-17 LAB — COMPREHENSIVE METABOLIC PANEL
ALT: 15 U/L (ref 0–44)
AST: 14 U/L — ABNORMAL LOW (ref 15–41)
Albumin: 3.8 g/dL (ref 3.5–5.0)
Alkaline Phosphatase: 105 U/L (ref 38–126)
Anion gap: 7 (ref 5–15)
BUN: 12 mg/dL (ref 6–20)
CO2: 25 mmol/L (ref 22–32)
Calcium: 9 mg/dL (ref 8.9–10.3)
Chloride: 106 mmol/L (ref 98–111)
Creatinine, Ser: 0.73 mg/dL (ref 0.61–1.24)
GFR, Estimated: >60 mL/min (ref >60)
Glucose, Bld: 151 mg/dL — ABNORMAL HIGH (ref 70–99)
Potassium: 4.3 mmol/L (ref 3.5–5.1)
Sodium: 138 mmol/L (ref 135–145)
Total Bilirubin: 0.8 mg/dL (ref 0.3–1.2)
Total Protein: 7 g/dL (ref 6.5–8.1)

## 2022-07-17 LAB — BRAIN NATRIURETIC PEPTIDE: B Natriuretic Peptide: 14.1 pg/mL (ref 0.0–100.0)

## 2022-07-17 MED ORDER — ENTRESTO 97-103 MG PO TABS: 1.0000 | ORAL_TABLET | Freq: Two times a day (BID) | ORAL | 6 refills | Status: DC

## 2022-07-17 NOTE — Patient Instructions (Signed)
Medication Changes:  INCREASE Entresto to 97/103 mg Twice daily   Lab Work:  Labs done today, your results will be available in MyChart, we will contact you for abnormal readings.   Testing/Procedures:  Abdominal ultrasound has been ordered  Referrals:  You have been referred to [facility], they will call you for an appointment  Special Instructions // Education:  Do the following things EVERYDAY: Weigh yourself in the morning before breakfast. Write it down and keep it in a log. Take your medicines as prescribed Eat low salt foods--Limit salt (sodium) to 2000 mg per day.  Stay as active as you can everyday Limit all fluids for the day to less than 2 liters   Follow-Up in: 2 months, **PLEASE CALL OUR OFFICE IN [place] TO SCHEDULE THIS APPOINTMENT    If you have any questions or concerns before your next appointment please send [place] a message through mychart or call our office at [phone] Monday-Friday 8 am-5 pm.   If you have an urgent need after hours on the weekend please call your Primary Cardiologist or the [facility] in [place] at [phone].

## 2022-07-17 NOTE — Progress Notes (Signed)
[facility] NOTE  Referring Physician: Dr. Garen Lah Primary Care: Vidal Schwalbe, MD Primary Cardiologist: Dr. Garen Lah   HPI:  [name] Gregory is a 51 y.o. male with a history of HTN, T2DM, tobacco abuse, obesity, OSA (on CPAP) and HFrEF referred by Dr. Garen Lah for further evaluation of his GF.   Has multiple family members with HF on his father's side. Developed HF symptoms in Jan/Feb 2023. He had a prior TTE in 2020 that showed moderate concentric hypertrophy with normal EF. He was seen by Dr. Gennette Pac at [facility] 11/03/21 nd underwent echo and stress test. 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 LVEDP was elevated at 9mHg. Echo same day showed EF 20-25%, LV dilation, hypokinesis of the apical anterior, apical, apical inferior and apical septal segments, and grade III diastolic dysfunction. He also complained that his heart was fluttering, lasts for several seconds. Given this a Ziopatch was placed which was unrevealing. (A several brief runs SVT. 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 Referred to genetics for further discussion    [facility] Lahand HF meds have been titrated. Recently had to cut back Entresto due to low BP.     Echo 8/23 @ [facility] EF 55%  cMRI: 11/23: EF 52% RV normal. No LGE.  I saw him in October for initial HF consultation. EF had recovered but still having significant HF symptoms. ReDS 38%. cMRI obtained and was normal (see above). Pending CPX study   Seen again last month acute visit due to progressive dyspnea and weight gain. ReDs normal at 36%. Referred for RHC on 06/28/22. RHC with low filling pressures and high CO.   RA = 3 RV =  35/7 PA = 36/19 (24) PCW = 11 Fick cardiac output/index = 9.0/3.3 PVR = 1.4 WU FA sat = 96% PA sat = 69%, 71% SVC sat = 78%  Returns today for f/u. Continues with episodes of chest tightness and exertional dyspnea. Has been watching his intake but weight still up 2 more pounds. Continue with LE edema, says it is ok in the am but get progressively worse throughout the day and has to take off compression stockings. Taking meds routinely. BP remains high.    Past Medical History:  Diagnosis Date   CHF (congestive heart failure) (HCC)    Complication of anesthesia    WOKE UP DURING SURGERY   Diabetes (HCC)    GERD (gastroesophageal reflux disease)    Hemangioma of liver    Hypertension    Sleep apnea    URI (upper respiratory infection)     Current Outpatient Medications  Medication Sig Dispense Refill   atorvastatin (LIPITOR) 20 MG tablet Take 1 tablet (20 mg total) by mouth daily. 90 tablet 3   buPROPion (ZYBAN) 150 MG 12 hr tablet Take 150 mg by mouth 2 (two) times daily.     carvedilol (COREG) 25 MG tablet Take 1 tablet (25 mg total) by mouth 2 (two) times daily with a meal. 180 tablet 3   citalopram (CELEXA) 20 MG tablet Take 1 tablet by mouth daily.     gabapentin (NEURONTIN) 300 MG capsule Take 300 mg by mouth 2 (two) times daily. Take 2 capsule  in AM and 1 capsule q  PM for nerve pain     JARDIANCE 10 MG TABS tablet Take 1 tablet (10 mg total) by mouth daily. 90 tablet 3   LORazepam (ATIVAN) 0.5 MG tablet Take 0.5 mg by mouth every 8 (eight) hours as needed for anxiety.      magnesium oxide (MAG-OX) 400 (240 Mg) MG tablet Take 400 mg by mouth daily.     metFORMIN (GLUCOPHAGE) 1000 MG tablet Take 1 tablet (1,000 mg total) by mouth 2 (two) times daily with a meal. 180 tablet 3   omeprazole (PRILOSEC) 20 MG capsule Take 20 mg by mouth daily.     sacubitril-valsartan (ENTRESTO) 49-51 MG Take 1 tablet by mouth 2 (two) times daily. 180 tablet 3   Semaglutide, 1 MG/DOSE, 4 MG/3ML  SOPN Inject 1 mg as directed once a week. 6 mL 3   spironolactone (ALDACTONE) 25 MG tablet Take 25 mg by mouth daily.     torsemide (DEMADEX) 20 MG tablet Take 2 tablets (40 mg total) by mouth 2 (two) times daily. Take extra tab if needed 135 tablet 6   Vitamin D, Ergocalciferol, (DRISDOL) 50000 units CAPS capsule Take 1 capsule by mouth 3 (three) times a week.     No current facility-administered medications for this visit.    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[employer]  Tobacco Use   Smoking status: Former    Packs/day: 0.25    Years: 36.00    Total pack years: 9.00    Types: Cigarettes    Quit date: 11/18/2021    Years since quitting: 0.6   Smokeless tobacco: Never   Tobacco comments:    QUIT SMOKING IN APRIL  Vaping Use   Vaping Use: Every day  Substance and Sexual Activity   Alcohol use: Not Currently    Comment: maybe once a month   Drug use: No   Sexual activity: Not on file  Other Topics Concern   Not on file  Social History Narrative   Not on file   Social Determinants of Health   Financial Resource Strain: Unknown (07/17/2019)   Overall Financial Resource Strain (CARDIA)    Difficulty of Paying Living Expenses: Patient refused  Food Insecurity: Unknown (07/17/2019)   Hunger Vital Sign    Worried About [place] in the Last Year: Patient refused    [place] in the Last Year: Patient refused  Transportation Needs: Unknown (07/17/2019)   [place] - Transportation    Lack of Transportation (Medical): Patient refused    Lack of Transportation (Non-Medical): Patient refused  Physical Activity: Unknown (07/17/2019)   Exercise Vital Sign    Days of Exercise per Week: Patient refused    Minutes of Exercise  per Session: Patient refused  Stress: Not on file  Social Connections: Unknown (07/17/2019)   Social Connection and Isolation Panel [NHANES]    Frequency of Communication with Friends and Family: Patient refused    Frequency of Social Gatherings with Friends and Family: Patient refused    Attends Religious Services: Patient refused    Active Member of Clubs or Organizations: Patient refused    Attends Archivist Meetings: Patient refused    Marital Status:  Patient refused  Intimate Partner Violence: Unknown (07/17/2019)   Humiliation, Afraid, Rape, and Kick questionnaire    Fear of Current or Ex-Partner: Patient refused    Emotionally Abused: Patient refused    Physically Abused: Patient refused    Sexually Abused: Patient refused      Family History  Problem Relation Age of Onset   Hypertension Mother    CVA Father    Diabetes Father    Throat cancer Father    Heart disease Brother 60   Cervical cancer Maternal Grandmother    Throat cancer Maternal Grandmother    Throat cancer Maternal Grandfather    Bladder Cancer Maternal Grandfather    Cirrhosis Maternal Grandfather    Colon cancer Neg Hx     Vitals:   07/17/22 1101  BP: (!) 148/96  Pulse: 94  Resp: 20  SpO2: 100%  Weight: (!) 320 lb (145.2 kg)   Wt Readings from Last 3 Encounters:  07/17/22 (!) 320 lb (145.2 kg)  07/10/22 (!) 318 lb (144.2 kg)  06/28/22 (!) 317 lb (143.8 kg)     PHYSICAL EXAM: General:  Obese male wearing O2 . No resp difficulty at rest HEENT: normal Neck: supple. no JVD. Carotids 2+ bilat; no bruits. No lymphadenopathy or thryomegaly appreciated. Cor: PMI nondisplaced. Regular rate & rhythm. No rubs, gallops or murmurs. Lungs: clear Abdomen: obese, soft, nontender, nondistended. No hepatosplenomegaly. No bruits or masses. Good bowel sounds. Extremities: no cyanosis, clubbing, rash, trace-1+ edema Neuro: alert & orientedx3, cranial nerves grossly intact. moves all 4 extremities  w/o difficulty. Affect pleasant   ASSESSMENT & PLAN:   1. 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 Referred to genetics for further discussion - SPEP/IFE showed no monoclonal protein - Echo 4/24 EF 20-25% - Cath 4/23 non-obstructive CAD - Echo 8/23 [facility] with recovered LVEF 55%, LVIDd ~5.5, normal RV, RVSP ~29 - cMRI: 11/23: EF 52% RV normal. No LGE. - RHC 11/23  RA 3 PA 36/19 (24) PCW 11 Fick CO/CI 9.0/3.3 PA sat = 69%, 71% SVC sat = 78% - Despite recovered EF and normal filling pressures still with NYHA III-IIIb symptoms. Pending CPX testing to further evaluate - RHC with elevated CO but no evidence of shunt of previous ab CT with contrast or cMRI - Will refer for ab u/s to look for evidence of NASH/cirrhosis - Continue carvedilol 25 bid  - Continue Jardiance 10  - Increase Entresto to 97/103 bid b - Continue Spiro 25 daily - Continue TED hose - Refer to [facility] for evaluation of venous insufficiency with ongoing edema despite normal filling pressures  2. Essential hypertension - BP up. Increase Entresto  3. DM2 - Followed by PCP - continue SGLT2i  4. Palpitations - Zio Apr 2023, no significant arrhythmias, rare ventricular ectopy  5. Morbid obesity - Body mass index is 41.09 kg/m. - continue ozempic  6. Chronic Hypoxic Respiratory Failure: - Per patient, was dropping O2 sat with exertion and qualified for home O2 - Unclear why he's requiring O2. Has been evaluted by Pulmonology (Dr. Elsworth Soho) - No decline in sat with 6MW - Pending CPX. Suspect he may no longer need supplemental O2.   Glori Bickers, MD  11:47 AM

## 2022-07-24 ENCOUNTER — Encounter: Payer: Self-pay | Admitting: Pulmonary Disease

## 2022-07-24 ENCOUNTER — Ambulatory Visit (INDEPENDENT_AMBULATORY_CARE_PROVIDER_SITE_OTHER): Payer: BC Managed Care – PPO | Admitting: Pulmonary Disease

## 2022-07-24 VITALS — BP 126/84 | HR 90 | Temp 98.7°F | Ht 74.0 in | Wt 319.6 lb

## 2022-07-24 DIAGNOSIS — G4733 Obstructive sleep apnea (adult) (pediatric): Secondary | ICD-10-CM

## 2022-07-24 DIAGNOSIS — N342 Other urethritis: Secondary | ICD-10-CM | POA: Diagnosis not present

## 2022-07-24 DIAGNOSIS — Z720 Tobacco use: Secondary | ICD-10-CM | POA: Diagnosis not present

## 2022-07-24 DIAGNOSIS — R0609 Other forms of dyspnea: Secondary | ICD-10-CM | POA: Diagnosis not present

## 2022-07-24 NOTE — Addendum Note (Signed)
Addended by: Irine Seal B on: 07/24/2022 03:54 PM   Modules accepted: Orders

## 2022-07-24 NOTE — Patient Instructions (Signed)
  X Refer to lung cancer screening program  X Amb sat  X CPAP download

## 2022-07-24 NOTE — Assessment & Plan Note (Signed)
CPAP download previously has shown good control of events on auto CPAP settings.  He is very compliant by report and CPAP is only helped improve his daytime somnolence and fatigue  Weight loss encouraged, compliance with goal of at least 4-6 hrs every night is the expectation. Advised against medications with sedative side effects Cautioned against driving when sleepy - understanding that sleepiness will vary on a day to day basis

## 2022-07-24 NOTE — Progress Notes (Signed)
Subjective:    Patient ID: [name] Gregory, male    DOB: 1971/04/04, 51 y.o.   MRN: [id number]  HPI  51 yo obese smoker for FU of obstructive sleep apnea. 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 DME - [employer], was on CPAP 13 cm   He smoked about a pack per day, about 25 pack years, quit 11/2021   PMH - hypertension controlled with 4 medications HFrEF - 20-25 % , LHC 11/2021 non obs CAD cMRI: 11/23: EF 52% RV normal. No LGE.  [place] 06/2022 PVR 1.4 WU, RA 3   Chief Complaint  Patient presents with   Follow-up    Pt states his breathing is about the same since LOV.   48-monthfollow-up visit He was found to desaturate at PCP office and maintained on oxygen since 12/2021. I reviewed CHF follow-up visit and his workup so far.  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  He continues to complain of significant shortness of breath and CPET has been scheduled. He continues to use oxygen and feels that this helps. He is concerned about family history of lung cancer in his father and request lung cancer screening scan He is compliant with his CPAP machine and cannot sleep without it, denies any problems with mask or pressure  He has been started on Jardiance and he complains of dysuria and increased frequency of urine, requesting antibiotic  Significant tests/ events reviewed   03/2017 NPSG >> severe , AHI 62/h, lowest desatn 70 %  01/2022 PFTs no airway obstruction, ratio 84, FEV1 80%, FVC 77%, 10% bronchodilator response with FEV1 improved to 89% which may have been related to effort, TLC normal, DLCO 22.6/67%  Review of Systems neg for any significant sore throat, dysphagia, itching, sneezing, nasal congestion or excess/ purulent secretions, fever, chills, sweats, unintended wt loss, pleuritic or exertional cp, hempoptysis, orthopnea pnd or change in chronic leg swelling. Also denies presyncope, palpitations, heartburn, abdominal  pain, nausea, vomiting, diarrhea or change in bowel or urinary habits, dysuria,hematuria, rash, arthralgias, visual complaints, headache, numbness weakness or ataxia.     Objective:   Physical Exam  Gen. Pleasant, obese, in no distress ENT - no lesions, no post nasal drip Neck: No JVD, no thyromegaly, no carotid bruits Lungs: no use of accessory muscles, no dullness to percussion, decreased without rales or rhonchi  Cardiovascular: Rhythm regular, heart sounds  normal, no murmurs or gallops, no peripheral edema Musculoskeletal: No deformities, no cyanosis or clubbing , no tremors       Assessment & Plan:  Dyspnea on exertion - He complains of persistent dyspnea but surprisingly his lung function is near normal.  I do not think he has significant bronchodilator response, decreased FEV1 prebronchodilator was probably related to poor effort less than 6 seconds flow.  Slight decrease in DLCO may have been related to some degree of pulmonary edema when he was dealing with acute systolic heart failure issues.  In fact he does not desaturate significantly on walking today and have asked him to stay off oxygen if he tolerates. He does not seem to have significant pulmonary hypertension, CPET is planned  Tobacco abuse -he quit smoking earlier in the year but he is concerned about his family history of lung cancer.  I will refer him for lung cancer screening program not sure if he meets criteria   UTI -will check urinalysis, his symptoms could be related to  Jardiance

## 2022-07-25 ENCOUNTER — Ambulatory Visit
Admission: RE | Admit: 2022-07-25 | Discharge: 2022-07-25 | Disposition: A | Discharge status: Home / Self Care | Payer: BC Managed Care – PPO | Source: Ambulatory Visit | Attending: Internal Medicine | Admitting: Internal Medicine

## 2022-07-25 DIAGNOSIS — I5032 Chronic diastolic (congestive) heart failure: Secondary | ICD-10-CM | POA: Insufficient documentation

## 2022-07-25 DIAGNOSIS — R140 Abdominal distension (gaseous): Secondary | ICD-10-CM | POA: Insufficient documentation

## 2022-07-26 ENCOUNTER — Other Ambulatory Visit: Payer: Self-pay

## 2022-07-26 DIAGNOSIS — N342 Other urethritis: Secondary | ICD-10-CM

## 2022-08-22 ENCOUNTER — Encounter (INDEPENDENT_AMBULATORY_CARE_PROVIDER_SITE_OTHER): Payer: BC Managed Care – PPO

## 2022-08-22 ENCOUNTER — Encounter (INDEPENDENT_AMBULATORY_CARE_PROVIDER_SITE_OTHER): Payer: BC Managed Care – PPO | Admitting: Nurse Practitioner

## 2022-09-08 ENCOUNTER — Telehealth: Payer: Self-pay | Admitting: Cardiology

## 2022-09-08 NOTE — Telephone Encounter (Signed)
Left message to call back  

## 2022-09-08 NOTE — Telephone Encounter (Signed)
STAT if HR is under 50 or over 120 (normal HR is 60-100 beats per minute)  What is your heart rate? 109  Do you have a log of your heart rate readings (document readings)? Patient states his HR has been running between 112-122  Do you have any other symptoms? He states he was diagnosed with pneumonia last week Wednesday.  He states he finished taking the antibiotics that were prescribed to him. He states his oxygen is still low in the 88-90% range.  He called the A-fib clinic and they advised him to give [place] a call.

## 2022-09-10 ENCOUNTER — Emergency Department: Payer: BC Managed Care – PPO

## 2022-09-10 ENCOUNTER — Emergency Department
Admission: EM | Admit: 2022-09-10 | Discharge: 2022-09-10 | Disposition: A | Discharge status: Home / Self Care | Payer: BC Managed Care – PPO | Attending: Emergency Medicine | Admitting: Emergency Medicine

## 2022-09-10 ENCOUNTER — Other Ambulatory Visit: Payer: Self-pay

## 2022-09-10 DIAGNOSIS — R0600 Dyspnea, unspecified: Secondary | ICD-10-CM | POA: Diagnosis not present

## 2022-09-10 DIAGNOSIS — E119 Type 2 diabetes mellitus without complications: Secondary | ICD-10-CM | POA: Diagnosis not present

## 2022-09-10 DIAGNOSIS — I509 Heart failure, unspecified: Secondary | ICD-10-CM | POA: Insufficient documentation

## 2022-09-10 DIAGNOSIS — Z1152 Encounter for screening for COVID-19: Secondary | ICD-10-CM | POA: Insufficient documentation

## 2022-09-10 DIAGNOSIS — R0602 Shortness of breath: Secondary | ICD-10-CM | POA: Diagnosis present

## 2022-09-10 DIAGNOSIS — I110 Hypertensive heart disease with heart failure: Secondary | ICD-10-CM | POA: Insufficient documentation

## 2022-09-10 LAB — RESP PANEL BY RT-PCR (RSV, FLU A&B, COVID)  RVPGX2
Influenza A by PCR: NEGATIVE
Influenza B by PCR: NEGATIVE
Resp Syncytial Virus by PCR: NEGATIVE
SARS Coronavirus 2 by RT PCR: NEGATIVE

## 2022-09-10 LAB — BASIC METABOLIC PANEL
Anion gap: 10 (ref 5–15)
BUN: 16 mg/dL (ref 6–20)
CO2: 26 mmol/L (ref 22–32)
Calcium: 8.6 mg/dL — ABNORMAL LOW (ref 8.9–10.3)
Chloride: 99 mmol/L (ref 98–111)
Creatinine, Ser: 1.09 mg/dL (ref 0.61–1.24)
GFR, Estimated: >60 mL/min (ref >60)
Glucose, Bld: 252 mg/dL — ABNORMAL HIGH (ref 70–99)
Potassium: 4.4 mmol/L (ref 3.5–5.1)
Sodium: 135 mmol/L (ref 135–145)

## 2022-09-10 LAB — CBC
HCT: 41.8 % (ref 39.0–52.0)
Hemoglobin: 12.9 g/dL — ABNORMAL LOW (ref 13.0–17.0)
MCH: 23.1 pg — ABNORMAL LOW (ref 26.0–34.0)
MCHC: 30.9 g/dL (ref 30.0–36.0)
MCV: 74.8 fL — ABNORMAL LOW (ref 80.0–100.0)
Platelets: 298 10*3/uL (ref 150–400)
RBC: 5.59 MIL/uL (ref 4.22–5.81)
RDW: 15.2 % (ref 11.5–15.5)
WBC: 12.7 10*3/uL — ABNORMAL HIGH (ref 4.0–10.5)
nRBC: 0 % (ref 0.0–0.2)

## 2022-09-10 LAB — TROPONIN I (HIGH SENSITIVITY): Troponin I (High Sensitivity): 5 ng/L (ref <18)

## 2022-09-10 LAB — BRAIN NATRIURETIC PEPTIDE: B Natriuretic Peptide: 12.7 pg/mL (ref 0.0–100.0)

## 2022-09-10 MED ORDER — ALBUTEROL SULFATE HFA 108 (90 BASE) MCG/ACT IN AERS: 2.0000 | INHALATION_SPRAY | Freq: Four times a day (QID) | RESPIRATORY_TRACT | 2 refills | Status: AC | PRN

## 2022-09-10 MED ORDER — IPRATROPIUM-ALBUTEROL 0.5-2.5 (3) MG/3ML IN SOLN
3.0000 mL | Freq: Once | RESPIRATORY_TRACT | Status: AC
  Administered 2022-09-10: 3 mL via RESPIRATORY_TRACT
  Filled 2022-09-10: qty 3

## 2022-09-10 NOTE — ED Triage Notes (Signed)
Pt comes with c/o increased sob, congestion. Pt states few days it started. Pt states hx of chf. Pt does wear 2L [place] at home.

## 2022-09-10 NOTE — ED Notes (Signed)
Pt resting comfortably, hooked to the cardiac monitor. Vitals stable on 2L of oxygen which is the pts baseline.

## 2022-09-10 NOTE — ED Provider Notes (Signed)
[facility] Provider Note    Event Date/Time   First MD Initiated Contact with Patient 09/10/22 1500     (approximate)  History   Chief Complaint: Shortness of Breath  HPI  [name] Gregory is a 52 y.o. male with a past medical history of CHF on 2 L of oxygen 24/7, hypertension, diabetes, presents emergency department for shortness of breath.  According to the patient over the past 2 days he has been feeling somewhat more short of breath with slight cough as well.  States intermittent nausea as well.  Patient denies any chest pain, states cough but no sputum production.  No fever.  Physical Exam   Triage Vital Signs: ED Triage Vitals [09/10/22 1339]  Enc Vitals Group     BP (!) 116/94     Pulse Rate 95     Resp 18     Temp 98.7 F (37.1 C)     Temp src      SpO2 99 %     Weight      Height      Head Circumference      Peak Flow      Pain Score 0     Pain Loc      Pain Edu?      Excl. in [place]?     Most recent vital signs: Vitals:   09/10/22 1339 09/10/22 1511  BP: (!) 116/94   Pulse: 95 90  Resp: 18 18  Temp: 98.7 F (37.1 C)   SpO2: 99% 96%    General: Awake, no distress.  CV:  Good peripheral perfusion.  Regular rate and rhythm  Resp:  Normal effort.  Equal breath sounds bilaterally.  Clear lung sounds without wheeze rales rhonchi. Abd:  No distention.  Soft, nontender.  No rebound or guarding. Other:  No lower extremity edema   ED Results / Procedures / Treatments   EKG  EKG viewed and interpreted by myself shows sinus tachycardia 103 bpm with a narrow QRS, normal axis, normal intervals, no concerning ST changes.  RADIOLOGY  Chest x-ray viewed and interpreted by myself no consolidation or edema appreciated   MEDICATIONS ORDERED IN ED: Medications - No data to display   IMPRESSION / MDM / [place] / ED COURSE  I reviewed the triage vital signs and the nursing notes.  Patient's presentation is most consistent  with acute presentation with potential threat to life or bodily function.  Patient presents emergency department for shortness of breath.  He states over the past 2 days he has had a dry cough, no sputum production has had some slight nausea at times.  Patient states he was using his home pulse oximeter and it was giving readings closer to 90.  Patient wears 2 L of oxygen at all times.  Currently reading 99% throughout my evaluation with a good waveform on 2 L.  Patient denies any chest pain at any point.  No fever at any point.  Patient's basic labs are largely within normal limits including a CBC and chemistry.  Chest x-ray is clear.  EKG is reassuring.  I have added on a cardiac enzyme and BNP as a precaution as well as a COVID/flu/RSV swab.  Overall patient appears well with no significant findings on physical exam or workup thus far.  Remainder the patient's workup has resulted large within normal limits as well.  Patient's troponin is negative, BNP is normal, COVID/flu/RSV is normal.  Patient is satting  well 97% on his normal 2 L.  Patient states he is feeling better.  We will discharge the patient with PCP follow-up.  Patient agreeable to plan of care.  FINAL CLINICAL IMPRESSION(S) / ED DIAGNOSES   Dyspnea   Note:  This document was prepared using Dragon voice recognition software and may include unintentional dictation errors.   Harvest Dark, MD 09/10/22 [phone]

## 2022-09-12 ENCOUNTER — Other Ambulatory Visit (INDEPENDENT_AMBULATORY_CARE_PROVIDER_SITE_OTHER): Payer: Self-pay | Admitting: Nurse Practitioner

## 2022-09-12 DIAGNOSIS — I872 Venous insufficiency (chronic) (peripheral): Secondary | ICD-10-CM

## 2022-09-14 ENCOUNTER — Encounter (INDEPENDENT_AMBULATORY_CARE_PROVIDER_SITE_OTHER): Payer: Self-pay | Admitting: Nurse Practitioner

## 2022-09-14 ENCOUNTER — Ambulatory Visit (INDEPENDENT_AMBULATORY_CARE_PROVIDER_SITE_OTHER): Payer: PRIVATE HEALTH INSURANCE

## 2022-09-14 ENCOUNTER — Ambulatory Visit (INDEPENDENT_AMBULATORY_CARE_PROVIDER_SITE_OTHER): Payer: PRIVATE HEALTH INSURANCE | Admitting: Nurse Practitioner

## 2022-09-14 VITALS — BP 115/81 | HR 99 | Ht 74.0 in | Wt 319.0 lb

## 2022-09-14 DIAGNOSIS — R209 Unspecified disturbances of skin sensation: Secondary | ICD-10-CM | POA: Diagnosis not present

## 2022-09-14 DIAGNOSIS — I10 Essential (primary) hypertension: Secondary | ICD-10-CM | POA: Diagnosis not present

## 2022-09-14 DIAGNOSIS — R42 Dizziness and giddiness: Secondary | ICD-10-CM

## 2022-09-14 DIAGNOSIS — I872 Venous insufficiency (chronic) (peripheral): Secondary | ICD-10-CM

## 2022-09-14 NOTE — Telephone Encounter (Signed)
No call back from the patient. In reviewing his chart, he was seen in the ER on 09/10/22.  Closing this encounter.

## 2022-09-21 ENCOUNTER — Other Ambulatory Visit (INDEPENDENT_AMBULATORY_CARE_PROVIDER_SITE_OTHER): Payer: Self-pay | Admitting: Nurse Practitioner

## 2022-09-21 ENCOUNTER — Ambulatory Visit (INDEPENDENT_AMBULATORY_CARE_PROVIDER_SITE_OTHER): Payer: PRIVATE HEALTH INSURANCE

## 2022-09-21 ENCOUNTER — Encounter (INDEPENDENT_AMBULATORY_CARE_PROVIDER_SITE_OTHER): Payer: Self-pay | Admitting: Nurse Practitioner

## 2022-09-21 ENCOUNTER — Ambulatory Visit (INDEPENDENT_AMBULATORY_CARE_PROVIDER_SITE_OTHER): Payer: PRIVATE HEALTH INSURANCE | Admitting: Nurse Practitioner

## 2022-09-21 VITALS — BP 104/74 | HR 100 | Ht 74.0 in | Wt 319.0 lb

## 2022-09-21 DIAGNOSIS — G629 Polyneuropathy, unspecified: Secondary | ICD-10-CM | POA: Diagnosis not present

## 2022-09-21 DIAGNOSIS — I890 Lymphedema, not elsewhere classified: Secondary | ICD-10-CM

## 2022-09-21 DIAGNOSIS — R209 Unspecified disturbances of skin sensation: Secondary | ICD-10-CM | POA: Diagnosis not present

## 2022-09-21 DIAGNOSIS — R42 Dizziness and giddiness: Secondary | ICD-10-CM

## 2022-09-21 DIAGNOSIS — I10 Essential (primary) hypertension: Secondary | ICD-10-CM

## 2022-09-22 LAB — VAS US ABI WITH/WO TBI
Left ABI: 1.1
Right ABI: 1.19

## 2022-10-01 ENCOUNTER — Encounter (INDEPENDENT_AMBULATORY_CARE_PROVIDER_SITE_OTHER): Payer: Self-pay | Admitting: Nurse Practitioner

## 2022-10-01 NOTE — Progress Notes (Signed)
Subjective:    Patient ID: [name] Gregory, male    DOB: 05/20/1971, 52 y.o.   MRN: [id number] Chief Complaint  Patient presents with   New Patient (Initial Visit)    np. reflux + consult. venous insufficiency. referred by bensimhon, daniel    [name] Gregory is a 52 year old male who presents today as a referral from his cardiologist in reference to lower extremity edema.  The patient does have heart failure that has been diagnosed from last year or so.  He notes that the swelling has also been ongoing for approximately the last year.  He notes that it is better in the morning and worse as the day progresses.  He notes that it is painful at times.  He does not have any known neuropathy that he is aware of.  He also reports that his toes become cold and painful at times.  He also has been suffering with dizziness.    Review of Systems  Cardiovascular:  Positive for leg swelling.  Neurological:  Positive for dizziness.  All other systems reviewed and are negative.      Objective:   Physical Exam Vitals reviewed.  HENT:     Head: Normocephalic.  Cardiovascular:     Rate and Rhythm: Normal rate.  Pulmonary:     Effort: Pulmonary effort is normal.  Musculoskeletal:     Right lower leg: Edema present.     Left lower leg: Edema present.  Skin:    General: Skin is warm and dry.  Neurological:     Mental Status: He is alert and oriented to person, place, and time.  Psychiatric:        Mood and Affect: Mood normal.        Behavior: Behavior normal.        Thought Content: Thought content normal.        Judgment: Judgment normal.     BP 115/81   Pulse 99   Ht 6' 2"$  (1.88 m)   Wt (!) 319 lb (144.7 kg)   BMI 40.96 kg/m   Past Medical History:  Diagnosis Date   CHF (congestive heart failure) (HCC)    Complication of anesthesia    WOKE UP DURING SURGERY   Diabetes (HCC)    GERD (gastroesophageal reflux disease)    Hemangioma of liver    Hypertension    Sleep apnea    URI  (upper respiratory infection)     Social History   Socioeconomic History   Marital status: Married    Spouse name: Not on file   Number of children: Not on file   Years of education: Not on file   Highest education level: Not on file  Occupational History   Occupation: administrative for [employer]  Tobacco Use   Smoking status: Former    Packs/day: 0.25    Years: 36.00    Total pack years: 9.00    Types: Cigarettes    Quit date: 11/18/2021    Years since quitting: 0.8   Smokeless tobacco: Never   Tobacco comments:    QUIT SMOKING IN APRIL  Vaping Use   Vaping Use: Every day  Substance and Sexual Activity   Alcohol use: Not Currently    Comment: maybe once a month   Drug use: No   Sexual activity: Not on file  Other Topics Concern   Not on file  Social History Narrative   Not on file   Social Determinants of Health   Financial  Resource Strain: Unknown (07/17/2019)   Overall Financial Resource Strain (CARDIA)    Difficulty of Paying Living Expenses: Patient refused  Food Insecurity: Unknown (07/17/2019)   Hunger Vital Sign    Worried About [place] in the Last Year: Patient refused    [place] in the Last Year: Patient refused  Transportation Needs: Unknown (07/17/2019)   PRAPARE - Transportation    Lack of Transportation (Medical): Patient refused    Lack of Transportation (Non-Medical): Patient refused  Physical Activity: Unknown (07/17/2019)   Exercise Vital Sign    Days of Exercise per Week: Patient refused    Minutes of Exercise per Session: Patient refused  Stress: Not on file  Social Connections: Unknown (07/17/2019)   Social Connection and Isolation Panel [NHANES]    Frequency of Communication with Friends and Family: Patient refused    Frequency of Social Gatherings with Friends and Family: Patient refused    Attends Religious Services: Patient refused    Active Member of Clubs or Organizations: Patient refused    Attends Theatre manager Meetings: Patient refused    Marital Status: Patient refused  Intimate Partner Violence: Unknown (07/17/2019)   Humiliation, Afraid, Rape, and Kick questionnaire    Fear of Current or Ex-Partner: Patient refused    Emotionally Abused: Patient refused    Physically Abused: Patient refused    Sexually Abused: Patient refused    Past Surgical History:  Procedure Laterality Date   BIOPSY  05/04/2016   Procedure: BIOPSY;  Surgeon: Daneil Dolin, MD;  Location: [facility];  Service: Endoscopy;;  gastric bx   CARDIAC CATHETERIZATION Bilateral 10/2021   COLONOSCOPY WITH PROPOFOL N/A 05/04/2016    PROPOFOL;  Surgeon: Daneil Dolin, MD; diverticulosis throughout the entire colon, otherwise normal.  Repeat due in 2027.   ESOPHAGOGASTRODUODENOSCOPY (EGD) WITH PROPOFOL N/A 05/04/2016   PROPOFOL;  Surgeon: Daneil Dolin, MD; LA grade B esophagitis, gastritis, small hiatal hernia.   EYE SURGERY     RIGHT HEART CATH Right 06/28/2022   Procedure: RIGHT HEART CATH;  Surgeon: Jolaine Artist, MD;  Location: [facility];  Service: Cardiovascular;  Laterality: Right;   SALIVARY GLAND SURGERY  1992    Family History  Problem Relation Age of Onset   Hypertension Mother    CVA Father    Diabetes Father    Throat cancer Father    Heart disease Brother 25   Cervical cancer Maternal Grandmother    Throat cancer Maternal Grandmother    Throat cancer Maternal Grandfather    Bladder Cancer Maternal Grandfather    Cirrhosis Maternal Grandfather    Colon cancer Neg Hx     No Known Allergies     Latest Ref Rng & Units 09/10/2022    1:40 PM 06/28/2022   10:21 AM 06/28/2022   10:15 AM  CBC  WBC 4.0 - 10.5 K/uL 12.7     Hemoglobin 13.0 - 17.0 g/dL 12.9  9.9  10.5   Hematocrit 39.0 - 52.0 % 41.8  29.0  31.0   Platelets 150 - 400 K/uL 298         CMP     Component Value Date/Time   NA 135 09/10/2022 1340   K 4.4 09/10/2022 1340   CL 99 09/10/2022 1340   CO2 26  09/10/2022 1340   GLUCOSE 252 (H) 09/10/2022 1340   BUN 16 09/10/2022 1340   BUN 28 (A) 12/29/2021 0000   CREATININE 1.09  09/10/2022 1340   CREATININE 0.72 03/18/2016 1352   CALCIUM 8.6 (L) 09/10/2022 1340   PROT 7.0 07/17/2022 1232   ALBUMIN 3.8 07/17/2022 1232   AST 14 (L) 07/17/2022 1232   ALT 15 07/17/2022 1232   ALKPHOS 105 07/17/2022 1232   BILITOT 0.8 07/17/2022 1232   GFRNONAA >60 09/10/2022 1340   GFRNONAA >89 03/18/2016 1352   GFRAA >60 07/18/2019 0430   GFRAA >89 03/18/2016 1352     VAS [place] ABI WITH/WO TBI  Result Date: 09/22/2022  LOWER EXTREMITY DOPPLER STUDY Patient Name:  [name] Gregory  Date of Exam:   09/21/2022 Medical Rec #: [id number]      Accession #:    [id number] Date of Birth: Dec 30, 1970       Patient Gender: M Patient Age:   80 years Exam Location:  [facility] Procedure:      VAS [place] ABI WITH/WO TBI Referring Phys: Arna Medici Obadiah Dennard --------------------------------------------------------------------------------  High Risk Factors: Hypertension, Diabetes, past history of smoking.  Performing Technologist: Delorise Shiner RVT  Examination Guidelines: A complete evaluation includes at minimum, Doppler waveform signals and systolic blood pressure reading at the level of bilateral brachial, anterior tibial, and posterior tibial arteries, when vessel segments are accessible. Bilateral testing is considered an integral part of a complete examination. Photoelectric Plethysmograph (PPG) waveforms and toe systolic pressure readings are included as required and additional duplex testing as needed. Limited examinations for reoccurring indications may be performed as noted.  ABI Findings: +---------+------------------+-----+---------+--------+ Right    Rt Pressure (mmHg)IndexWaveform Comment  +---------+------------------+-----+---------+--------+ Brachial 124                                      +---------+------------------+-----+---------+--------+ PTA      142                1.15 triphasic         +---------+------------------+-----+---------+--------+ DP       148               1.19 triphasic         +---------+------------------+-----+---------+--------+ Great Toe121               0.98                   +---------+------------------+-----+---------+--------+ +---------+------------------+-----+---------+-------+ Left     Lt Pressure (mmHg)IndexWaveform Comment +---------+------------------+-----+---------+-------+ Brachial 119                                     +---------+------------------+-----+---------+-------+ PTA      137               1.10 triphasic        +---------+------------------+-----+---------+-------+ DP       131               1.06 triphasic        +---------+------------------+-----+---------+-------+ Great Toe116               0.94                  +---------+------------------+-----+---------+-------+ +-------+-----------+-----------+------------+------------+ ABI/TBIToday's ABIToday's TBIPrevious ABIPrevious TBI +-------+-----------+-----------+------------+------------+ Right  1.19       0.98                                +-------+-----------+-----------+------------+------------+  Left   1.10       0.94                                +-------+-----------+-----------+------------+------------+  Summary: Right: Resting right ankle-brachial index is within normal range. The right toe-brachial index is normal. Left: Resting left ankle-brachial index is within normal range. The left toe-brachial index is normal. *See table(s) above for measurements and observations.  Electronically signed by Hortencia Pilar MD on 09/22/2022 at 11:07:35 AM.    Final        Assessment & Plan:   1. Venous (peripheral) insufficiency The patient has no evidence of venous insufficiency.  I would note that his swelling is likely a combination of factors.  Likely related to his heart failure and likely development  of some lymphedema from chronic leg swelling.  We discussed conservative therapies including medical grade compression, elevation and activity.  Pending his progression with this we may consider addition of a lymphedema pump. - VAS [place] LOWER EXTREMITY VENOUS REFLUX  2. Primary hypertension Continue antihypertensive medications as already ordered, these medications have been reviewed and there are no changes at this time.  3. Dizziness The patient has noted dizziness.  Will have him return to evaluate his carotid arteries to determine if there is any evidence of significant stenosis or subclavian steal that may account for his dizziness.  4. Bilateral cold feet The patient has cool painful toes.  Based on the description may be related to neuropathy.  However we will have the patient undergo ABIs to determine if there is a vascular component.  Current Outpatient Medications on File Prior to Visit  Medication Sig Dispense Refill   albuterol (VENTOLIN HFA) 108 (90 Base) MCG/ACT inhaler Inhale 2 puffs into the lungs every 6 (six) hours as needed for wheezing or shortness of breath. 8 g 2   atorvastatin (LIPITOR) 20 MG tablet Take 1 tablet (20 mg total) by mouth daily. 90 tablet 3   buPROPion (ZYBAN) 150 MG 12 hr tablet Take 150 mg by mouth 2 (two) times daily.     carvedilol (COREG) 25 MG tablet Take 1 tablet (25 mg total) by mouth 2 (two) times daily with a meal. 180 tablet 3   citalopram (CELEXA) 20 MG tablet Take 1 tablet by mouth daily.     gabapentin (NEURONTIN) 300 MG capsule Take 300 mg by mouth 2 (two) times daily. Take 2 capsule  in AM and 1 capsule q PM for nerve pain     JARDIANCE 10 MG TABS tablet Take 1 tablet (10 mg total) by mouth daily. 90 tablet 3   LORazepam (ATIVAN) 0.5 MG tablet Take 0.5 mg by mouth every 8 (eight) hours as needed for anxiety.      magnesium oxide (MAG-OX) 400 (240 Mg) MG tablet Take 400 mg by mouth daily.     metFORMIN (GLUCOPHAGE) 1000 MG tablet Take 1 tablet  (1,000 mg total) by mouth 2 (two) times daily with a meal. 180 tablet 3   omeprazole (PRILOSEC) 20 MG capsule Take 20 mg by mouth daily.     sacubitril-valsartan (ENTRESTO) 97-103 MG Take 1 tablet by mouth 2 (two) times daily. 60 tablet 6   Semaglutide, 1 MG/DOSE, 4 MG/3ML SOPN Inject 1 mg as directed once a week. 6 mL 3   spironolactone (ALDACTONE) 25 MG tablet Take 25 mg by mouth daily.     torsemide (DEMADEX) 20 MG tablet  Take 2 tablets (40 mg total) by mouth 2 (two) times daily. Take extra tab if needed 135 tablet 6   Vitamin D, Ergocalciferol, (DRISDOL) 50000 units CAPS capsule Take 1 capsule by mouth 3 (three) times a week.     No current facility-administered medications on file prior to visit.    There are no Patient Instructions on file for this visit. No follow-ups on file.   Kris Hartmann, NP

## 2022-10-02 ENCOUNTER — Telehealth: Payer: Self-pay | Admitting: Gastroenterology

## 2022-10-02 NOTE — Telephone Encounter (Signed)
Next available new patient with any doctor or APP  - HD

## 2022-10-02 NOTE — Telephone Encounter (Signed)
Dr. Loletha Carrow,  We received this referral from patient's PCP for microcytic anemia.  Patient last saw [facility] in 05/2020 and has moved and doesn't want to return to [facility].  Records are in EPIC.  Please review and advise scheduling.  Thanks  Dr. Loletha Carrow supervising MD 10/02/2022

## 2022-10-06 NOTE — Telephone Encounter (Signed)
LM on vmail to call back to schedule

## 2022-10-09 ENCOUNTER — Encounter (INDEPENDENT_AMBULATORY_CARE_PROVIDER_SITE_OTHER): Payer: Self-pay | Admitting: Nurse Practitioner

## 2022-10-09 NOTE — Progress Notes (Signed)
Subjective:    Patient ID: [name] Gregory, male    DOB: Oct 07, 1970, 52 y.o.   MRN: [id number] Chief Complaint  Patient presents with   Follow-up    [name] Gregory and carotid    [name] Gregory is a 52 year old male who presents today as a referral from his cardiologist in reference to lower extremity edema.  The patient does have heart failure that has been diagnosed from last year or so.  He notes that the swelling has also been ongoing for approximately the last year.  He notes that it is better in the morning and worse as the day progresses.  Utilize medical grade compression but this has not been helpful controlling the swelling.  He is also elevating his lower extremities and despite these conservative therapies they are not helpful with resolving his symptoms.  He notes that it is painful at times.  He does not have any known neuropathy that he is aware of.  He also reports that his toes become cold and painful at times.  He also has been suffering with dizziness.   Previous noninvasive study showed no evidence of superficial venous reflux or DVT.  Today noninvasive studies show no evidence of significant carotid disease.  His bilateral vertebral arteries have antegrade flow.  Normal flow hemodynamics in the bilateral subclavian arteries.  The patient has ABI of 1.19 on the right and 1.10 on the left.  He has triphasic tibial artery waveforms bilaterally with good toe waveforms bilaterally.     Review of Systems     Objective:   Physical Exam  BP 104/74   Pulse 100   Ht '6\' 2"'$  (1.88 m)   Wt (!) 319 lb (144.7 kg)   BMI 40.96 kg/m   Past Medical History:  Diagnosis Date   CHF (congestive heart failure) (HCC)    Complication of anesthesia    WOKE UP DURING SURGERY   Diabetes (HCC)    GERD (gastroesophageal reflux disease)    Hemangioma of liver    Hypertension    Sleep apnea    URI (upper respiratory infection)     Social History   Socioeconomic History   Marital status: Married     Spouse name: Not on file   Number of children: Not on file   Years of education: Not on file   Highest education level: Not on file  Occupational History   Occupation: administrative for [employer]  Tobacco Use   Smoking status: Former    Packs/day: 0.25    Years: 36.00    Total pack years: 9.00    Types: Cigarettes    Quit date: 11/18/2021    Years since quitting: 0.8   Smokeless tobacco: Never   Tobacco comments:    QUIT SMOKING IN APRIL  Vaping Use   Vaping Use: Every day  Substance and Sexual Activity   Alcohol use: Not Currently    Comment: maybe once a month   Drug use: No   Sexual activity: Not on file  Other Topics Concern   Not on file  Social History Narrative   Not on file   Social Determinants of Health   Financial Resource Strain: Unknown (07/17/2019)   Overall Financial Resource Strain (CARDIA)    Difficulty of Paying Living Expenses: Patient refused  Food Insecurity: Unknown (07/17/2019)   Hunger Vital Sign    Worried About Running Out of Food in the Last Year: Patient refused    Ran Out of Food in the Last  Year: Patient refused  Transportation Needs: Unknown (07/17/2019)   PRAPARE - Transportation    Lack of Transportation (Medical): Patient refused    Lack of Transportation (Non-Medical): Patient refused  Physical Activity: Unknown (07/17/2019)   Exercise Vital Sign    Days of Exercise per Week: Patient refused    Minutes of Exercise per Session: Patient refused  Stress: Not on file  Social Connections: Unknown (07/17/2019)   Social Connection and Isolation Panel [NHANES]    Frequency of Communication with Friends and Family: Patient refused    Frequency of Social Gatherings with Friends and Family: Patient refused    Attends Religious Services: Patient refused    Active Member of Clubs or Organizations: Patient refused    Attends Archivist Meetings: Patient refused    Marital Status: Patient refused  Intimate Partner Violence: Unknown  (07/17/2019)   Humiliation, Afraid, Rape, and Kick questionnaire    Fear of Current or Ex-Partner: Patient refused    Emotionally Abused: Patient refused    Physically Abused: Patient refused    Sexually Abused: Patient refused    Past Surgical History:  Procedure Laterality Date   BIOPSY  05/04/2016   Procedure: BIOPSY;  Surgeon: [name] Dolin, MD;  [facility];  Service: Endoscopy;;  gastric bx   CARDIAC CATHETERIZATION Bilateral 10/2021   COLONOSCOPY WITH PROPOFOL N/A 05/04/2016    PROPOFOL;  Surgeon: [name] Dolin, MD; diverticulosis throughout the entire colon, otherwise normal.  Repeat due in 2027.   ESOPHAGOGASTRODUODENOSCOPY (EGD) WITH PROPOFOL N/A 05/04/2016   PROPOFOL;  Surgeon: [name] Dolin, MD; LA grade B esophagitis, gastritis, small hiatal hernia.   EYE SURGERY     RIGHT HEART CATH Right 06/28/2022   Procedure: RIGHT HEART CATH;  Surgeon: [name] Artist, MD;  Location: [place] [facility];  Service: Cardiovascular;  Laterality: Right;   SALIVARY GLAND SURGERY  1992    Family History  Problem Relation Age of Onset   Hypertension Mother    CVA Father    Diabetes Father    Throat cancer Father    Heart disease Brother 53   Cervical cancer Maternal Grandmother    Throat cancer Maternal Grandmother    Throat cancer Maternal Grandfather    Bladder Cancer Maternal Grandfather    Cirrhosis Maternal Grandfather    Colon cancer Neg Hx     No Known Allergies     Latest Ref Rng & Units 09/10/2022    1:40 PM 06/28/2022   10:21 AM 06/28/2022   10:15 AM  CBC  WBC 4.0 - 10.5 K/uL 12.7     Hemoglobin 13.0 - 17.0 g/dL 12.9  9.9  10.5   Hematocrit 39.0 - 52.0 % 41.8  29.0  31.0   Platelets 150 - 400 K/uL 298         CMP     Component Value Date/Time   NA 135 09/10/2022 1340   K 4.4 09/10/2022 1340   CL 99 09/10/2022 1340   CO2 26 09/10/2022 1340   GLUCOSE 252 (H) 09/10/2022 1340   BUN 16 09/10/2022 1340   BUN 28 (A) 12/29/2021 0000    CREATININE 1.09 09/10/2022 1340   CREATININE 0.72 03/18/2016 1352   CALCIUM 8.6 (L) 09/10/2022 1340   PROT 7.0 07/17/2022 1232   ALBUMIN 3.8 07/17/2022 1232   AST 14 (L) 07/17/2022 1232   ALT 15 07/17/2022 1232   ALKPHOS 105 07/17/2022 1232   BILITOT 0.8 07/17/2022 1232   GFRNONAA >60  09/10/2022 1340   GFRNONAA >89 03/18/2016 1352   GFRAA >60 07/18/2019 0430   GFRAA >89 03/18/2016 1352     VAS [place] ABI WITH/WO TBI  Result Date: 09/22/2022  LOWER EXTREMITY DOPPLER STUDY Patient Name:  [name] Gregory  Date of Exam:   09/21/2022 Medical Rec #: [id number]      Accession #:    [id number] Date of Birth: 04-06-1971       Patient Gender: M Patient Age:   14 years Exam Location:  [facility] Procedure:      VAS [place] ABI WITH/WO TBI Referring Phys: Arna Medici Satomi Buda --------------------------------------------------------------------------------  High Risk Factors: Hypertension, Diabetes, past history of smoking.  Performing Technologist: Delorise Shiner RVT  Examination Guidelines: A complete evaluation includes at minimum, Doppler waveform signals and systolic blood pressure reading at the level of bilateral brachial, anterior tibial, and posterior tibial arteries, when vessel segments are accessible. Bilateral testing is considered an integral part of a complete examination. Photoelectric Plethysmograph (PPG) waveforms and toe systolic pressure readings are included as required and additional duplex testing as needed. Limited examinations for reoccurring indications may be performed as noted.  ABI Findings: +---------+------------------+-----+---------+--------+ Right    Rt Pressure (mmHg)IndexWaveform Comment  +---------+------------------+-----+---------+--------+ Brachial 124                                      +---------+------------------+-----+---------+--------+ PTA      142               1.15 triphasic         +---------+------------------+-----+---------+--------+ DP       148                1.19 triphasic         +---------+------------------+-----+---------+--------+ Great Toe121               0.98                   +---------+------------------+-----+---------+--------+ +---------+------------------+-----+---------+-------+ Left     Lt Pressure (mmHg)IndexWaveform Comment +---------+------------------+-----+---------+-------+ Brachial 119                                     +---------+------------------+-----+---------+-------+ PTA      137               1.10 triphasic        +---------+------------------+-----+---------+-------+ DP       131               1.06 triphasic        +---------+------------------+-----+---------+-------+ Great Toe116               0.94                  +---------+------------------+-----+---------+-------+ +-------+-----------+-----------+------------+------------+ ABI/TBIToday's ABIToday's TBIPrevious ABIPrevious TBI +-------+-----------+-----------+------------+------------+ Right  1.19       0.98                                +-------+-----------+-----------+------------+------------+ Left   1.10       0.94                                +-------+-----------+-----------+------------+------------+  Summary: Right: Resting right ankle-brachial  index is within normal range. The right toe-brachial index is normal. Left: Resting left ankle-brachial index is within normal range. The left toe-brachial index is normal. *See table(s) above for measurements and observations.  Electronically signed by Hortencia Pilar MD on 09/22/2022 at 11:07:35 AM.    Final        Assessment & Plan:   1. Lymphedema Recommend:  No surgery or intervention at this point in time.   The Patient is CEAP [id number].  The patient has been wearing compression for more than 12 weeks with no or little benefit.  The patient has been exercising daily for more than 12 weeks. The patient has been elevating and taking OTC pain  medications for more than 12 weeks.  None of these have have eliminated the pain related to the lymphedema or the discomfort regarding excessive swelling and venous congestion.    I have reviewed my discussion with the patient regarding lymphedema and why it  causes symptoms.  Patient will continue wearing graduated compression on a daily basis. The patient should put the compression on first thing in the morning and removing them in the evening. The patient should not sleep in the compression.   In addition, behavioral modification throughout the day will be continued.  This will include frequent elevation (such as in a recliner), use of over the counter pain medications as needed and exercise such as walking.  The systemic causes for chronic edema such as liver, kidney and cardiac etiologies do not appear to have significant changed over the past year.    The patient has chronic , severe lymphedema with hyperpigmentation of the skin and has done MLD, skin care, medication, diet, exercise, elevation and compression for 4 weeks with no improvement,  I am recommending a lymphedema pump.  The patient still has stage 3 lymphedema and therefore, I believe that a lymph pump is needed to improve the control of the patient's lymphedema and improve the quality of life.  Additionally, a lymph pump is warranted because it will reduce the risk of cellulitis and ulceration in the future.  Patient should follow-up in six months   2. Primary hypertension Continue antihypertensive medications as already ordered, these medications have been reviewed and there are no changes at this time.  3. Dizziness Today the patient's carotid artery duplex is negative for any significant vertebral stenosis or carotid disease.  The patient continues to have issues with dizziness should continue to work with his PCP for further workup and evaluation. no Vascular cause noted to his dizziness.  4. Neuropathy Given that the  patient's ABIs are normal I suspect that he may have probably.  Will refer the patient to neurology for further workup and evaluation.  Neurology may also be able to help workup dizziness as well.   Current Outpatient Medications on File Prior to Visit  Medication Sig Dispense Refill   albuterol (VENTOLIN HFA) 108 (90 Base) MCG/ACT inhaler Inhale 2 puffs into the lungs every 6 (six) hours as needed for wheezing or shortness of breath. 8 g 2   atorvastatin (LIPITOR) 20 MG tablet Take 1 tablet (20 mg total) by mouth daily. 90 tablet 3   buPROPion (ZYBAN) 150 MG 12 hr tablet Take 150 mg by mouth 2 (two) times daily.     carvedilol (COREG) 25 MG tablet Take 1 tablet (25 mg total) by mouth 2 (two) times daily with a meal. 180 tablet 3   citalopram (CELEXA) 20 MG tablet Take 1 tablet by mouth  daily.     gabapentin (NEURONTIN) 300 MG capsule Take 300 mg by mouth 2 (two) times daily. Take 2 capsule  in AM and 1 capsule q PM for nerve pain     JARDIANCE 10 MG TABS tablet Take 1 tablet (10 mg total) by mouth daily. 90 tablet 3   LORazepam (ATIVAN) 0.5 MG tablet Take 0.5 mg by mouth every 8 (eight) hours as needed for anxiety.      magnesium oxide (MAG-OX) 400 (240 Mg) MG tablet Take 400 mg by mouth daily.     metFORMIN (GLUCOPHAGE) 1000 MG tablet Take 1 tablet (1,000 mg total) by mouth 2 (two) times daily with a meal. 180 tablet 3   omeprazole (PRILOSEC) 20 MG capsule Take 20 mg by mouth daily.     sacubitril-valsartan (ENTRESTO) 97-103 MG Take 1 tablet by mouth 2 (two) times daily. 60 tablet 6   Semaglutide, 1 MG/DOSE, 4 MG/3ML SOPN Inject 1 mg as directed once a week. 6 mL 3   spironolactone (ALDACTONE) 25 MG tablet Take 25 mg by mouth daily.     torsemide (DEMADEX) 20 MG tablet Take 2 tablets (40 mg total) by mouth 2 (two) times daily. Take extra tab if needed 135 tablet 6   Vitamin D, Ergocalciferol, (DRISDOL) 50000 units CAPS capsule Take 1 capsule by mouth 3 (three) times a week.     No current  facility-administered medications on file prior to visit.    There are no Patient Instructions on file for this visit. No follow-ups on file.   Kris Hartmann, NP

## 2022-10-10 ENCOUNTER — Encounter: Payer: Self-pay | Admitting: Sports Medicine

## 2022-10-10 ENCOUNTER — Ambulatory Visit (INDEPENDENT_AMBULATORY_CARE_PROVIDER_SITE_OTHER): Payer: PRIVATE HEALTH INSURANCE

## 2022-10-10 ENCOUNTER — Ambulatory Visit: Payer: PRIVATE HEALTH INSURANCE | Admitting: Nurse Practitioner

## 2022-10-10 ENCOUNTER — Encounter: Payer: Self-pay | Admitting: Nurse Practitioner

## 2022-10-10 ENCOUNTER — Ambulatory Visit: Payer: PRIVATE HEALTH INSURANCE | Admitting: Sports Medicine

## 2022-10-10 VITALS — BP 108/76 | HR 94 | Ht 74.0 in | Wt 319.8 lb

## 2022-10-10 DIAGNOSIS — M5442 Lumbago with sciatica, left side: Secondary | ICD-10-CM | POA: Diagnosis not present

## 2022-10-10 DIAGNOSIS — M12811 Other specific arthropathies, not elsewhere classified, right shoulder: Secondary | ICD-10-CM

## 2022-10-10 DIAGNOSIS — E119 Type 2 diabetes mellitus without complications: Secondary | ICD-10-CM

## 2022-10-10 DIAGNOSIS — M25511 Pain in right shoulder: Secondary | ICD-10-CM

## 2022-10-10 DIAGNOSIS — G8929 Other chronic pain: Secondary | ICD-10-CM

## 2022-10-10 DIAGNOSIS — M5136 Other intervertebral disc degeneration, lumbar region: Secondary | ICD-10-CM

## 2022-10-10 LAB — POCT GLYCOSYLATED HEMOGLOBIN (HGB A1C): Hemoglobin A1C: 6.8 % — AB (ref 4.0–5.6)

## 2022-10-10 MED ORDER — BUPIVACAINE HCL 0.25 % IJ SOLN
2.0000 mL | INTRAMUSCULAR | Status: AC | PRN
  Administered 2022-10-10: 2 mL via INTRA_ARTICULAR

## 2022-10-10 MED ORDER — SEMAGLUTIDE (2 MG/DOSE) 8 MG/3ML ~~LOC~~ SOPN: 2.0000 mg | PEN_INJECTOR | SUBCUTANEOUS | 3 refills | Status: DC

## 2022-10-10 MED ORDER — MELOXICAM 15 MG PO TABS: 15.0000 mg | ORAL_TABLET | Freq: Every day | ORAL | 1 refills | Status: DC

## 2022-10-10 MED ORDER — LIDOCAINE HCL 1 % IJ SOLN
2.0000 mL | INTRAMUSCULAR | Status: AC | PRN
  Administered 2022-10-10: 2 mL

## 2022-10-10 MED ORDER — METHYLPREDNISOLONE ACETATE 40 MG/ML IJ SUSP
40.0000 mg | INTRAMUSCULAR | Status: AC | PRN
  Administered 2022-10-10: 40 mg via INTRA_ARTICULAR

## 2022-10-10 NOTE — Progress Notes (Signed)
[name] Gregory - 52 y.o. male MRN [id number]  Date of birth: 10-26-1970  Office Visit Note: Visit Date: 10/10/2022 PCP: Vidal Schwalbe, MD Referred by: Vidal Schwalbe, MD  Subjective: Chief Complaint  Patient presents with   Right Shoulder - Pain   Lower Back - Pain   HPI: [name] Gregory is a pleasant 52 y.o. male who presents today for chronic low back pain with radiculopathy and right shoulder pain x 1 month.  Right shoulder pain -he has had right shoulder pain for about 1 month.  He has pain in all directions with reaching.  Also has pain sleeping on that shoulder.  He has been taking ibuprofen or tylenol without much relief.  Denies any specific injury.  Low back pain -he has had many years of chronic low back pain.  About 15 years ago he had of ESI that gave some relief but also he reports it nicked a nerve? Does not want to repeat at this time. No prior surgeries.  Here over the last few weeks he has had exacerbation of his back pain that is now radiating down the left leg. Pain pattern is at posterior lateral hip that will radiate over the anterior shin down to the dorsum of the foot.  Denies any gross weakness.  Type II diabetes - managed on Jardiance, Metformin, Semaglutide '2mg'$  once weekly. Lab Results  Component Value Date   HGBA1C 6.8 (A) 10/10/2022    Pertinent ROS were reviewed with the patient and found to be negative unless otherwise specified above in HPI.   Assessment & Plan: Visit Diagnoses:  1. Acute pain of right shoulder   2. Chronic midline low back pain with left-sided sciatica   3. Rotator cuff arthropathy of right shoulder   4. DDD (degenerative disc disease), lumbar    Plan: Discussed with [name] Gregory that his low back seems like an exacerbation of his underlying degenerative disc disease.  However, he is having some symptoms of radicular pain more so in the L4/L5 dermatome of the left leg.  He has not done any formalized physical therapy or really any  intervention at this time.  We will get him started on formalized physical therapy.  Will begin him on meloxicam 15 mg for the next 2-3 weeks daily.  Further considerations could be ESI versus further evaluation with MRI, but we will hold on this for now.  In terms of his shoulder, his rotator cuff is certainly irritable he has guarding in all directions.  Through shared decision making, elected to proceed with subacromial joint injection and attempt to settle down his acute pain.  We will also get him into rehab for this.  I would like to see him back in about a month and we will be able to get a better evaluation of the shoulder and discuss next steps.  Follow-up: Return in about 5 weeks (around 11/14/2022).   Meds & Orders:  Meds ordered this encounter  Medications   meloxicam (MOBIC) 15 MG tablet    Sig: Take 1 tablet (15 mg total) by mouth daily.    Dispense:  30 tablet    Refill:  1    Orders Placed This Encounter  Procedures   Large Joint Inj: R subacromial bursa   XR Lumbar Spine 2-3 Views   XR Shoulder Right   Ambulatory referral to Physical Therapy     Procedures: Large Joint Inj: R subacromial bursa on 10/10/2022 1:34 PM Indications: pain Details: 22 G 1.5 in  needle, posterior approach Medications: 2 mL lidocaine 1 %; 2 mL bupivacaine 0.25 %; 40 mg methylPREDNISolone acetate 40 MG/ML Outcome: tolerated well, no immediate complications  Subacromial Joint Injection, Right Shoulder After discussion on risks/benefits/indications, informed verbal consent was obtained. A timeout was then performed. Patient was seated on table in exam room. The patient's shoulder was prepped with betadine and alcohol swabs and utilizing posterior approach a 22G, 1.5" needle was directed anteriorly and laterally into the patient's subacromial space was injected with 2:2:1 mixture of lidocaine:bupivicaine:depomedrol with appreciation of free-flowing of the injectate into the bursal space. Patient tolerated  the procedure well without immediate complications.   Procedure, treatment alternatives, risks and benefits explained, specific risks discussed. Consent was given by the patient. Immediately prior to procedure a time out was called to verify the correct patient, procedure, equipment, support staff and site/side marked as required. Patient was prepped and draped in the usual sterile fashion.          Clinical History: No specialty comments available.  He reports that he quit smoking about 10 months ago. His smoking use included cigarettes. He has a 9.00 pack-year smoking history. He has never used smokeless tobacco.  Recent Labs    04/06/22 0951 07/10/22 1453 10/10/22 1115  HGBA1C 6.0* 5.9* 6.8*    Objective:    Physical Exam  Gen: Well-appearing, in no acute distress; non-toxic CV:  Well-perfused. Warm.  Resp: Breathing unlabored on room air; no wheezing. On oxygen therapy currently,  in place. Psych: Fluid speech in conversation; appropriate affect; normal thought process Neuro: Sensation intact throughout. No gross coordination deficits.   Ortho Exam - Right shoulder: There is no initial swelling about the shoulder.  Pain at Codman's point.  Very mild TTP over the [facility] joint.  He has pain with reaching in all directions.  There is positive drop arm test.  There is no gross mechanical blocks to internal or external rotation but pain does produce guarding.  - Low back: No midline spinous process TTP.  He does have some pain at endrange flexion and extension.  There is some reproduction of pain with modified slump test on the left side.  He has 5/5 strength of bilateral lower extremities.   Imaging: XR Lumbar Spine 2-3 Views  Result Date: 10/10/2022 2 views of the lumbar spine including AP and lateral view were ordered and reviewed by myself.  There is degenerative disc disease most notable at L4 and L5 with narrowing of the intervertebral disc level here.  There is some sclerosis  at L5 as well as facet arthropathy at the L4-L5 region.  No significant scoliosis or prominent anterior listhesis.  XR Shoulder Right  Result Date: 10/10/2022 4 views of the right shoulder including AP, Grashey, scapular Y and axillary views were ordered and reviewed by myself.  There is mild glenohumeral joint arthritic change.  Moderate AC joint arthritis.  No acute fracture or evidence of bony abnormality noted.   MRI of the lumbar spoine 03/27/2010:  Narrative  Clinical Data: Low back pain radiating to both legs.   MRI LUMBAR SPINE WITHOUT CONTRAST   Technique:  Multiplanar and multiecho pulse sequences of the lumbar spine were obtained without intravenous contrast.   Comparison: None.   Findings: Anatomic alignment.  Focal degenerative disc disease at L4-5 with loss of disc height and signal.  Endplate reactive edema above and below reflect chronic degenerative change.   The individual disc spaces are examined as follows:   L1-2:  Normal.  L2-3:  Normal disc space.  Mild facet arthropathy.   L3-4:  Normal disc space.  Mild facet arthropathy.   L4-5:  Shallow central protrusion.  Moderate facet arthropathy. Mild canal stenosis but no definite L5 nerve root encroachment. Bilateral multifactorial neural foraminal narrowing could affect either L4 nerve root.l   L5-S1:  Normal disc space.  No significant facet arthropathy.   IMPRESSION: Degenerative disc disease at L4-L5 as described.  Shallow central protrusion is accompanied by moderate facet arthropathy.  Bilateral neural foraminal narrowing at this level is more impressive than lateral recess stenosis or spinal stenosis.  The patient's symptoms may derive from bilateral L4 nerve root compromise.    Past Medical/Family/Surgical/Social History: Medications & Allergies reviewed per EMR, new medications updated. Patient Active Problem List   Diagnosis Date Noted   Chronic respiratory failure with hypoxia ([place])  01/02/2022   HTN (hypertension) 06/10/2020   Loose stools 06/09/2020   GERD (gastroesophageal reflux disease) 06/09/2020   Elevated LFTs 06/09/2020   Early satiety 06/09/2020   Fatty liver 06/09/2020   Dizziness    Hyperglycemia    Liver enzyme elevation    Nausea vomiting and diarrhea    Transaminitis    AKI (acute kidney injury) ([place]) 07/17/2019   Abdominal pain 07/17/2019   Nasal congestion 07/17/2019   Post-nasal drainage 07/17/2019   OSA on CPAP 06/14/2017   Morbid (severe) obesity due to excess calories ([place]) 06/14/2017   Abdominal pain, epigastric 04/24/2016   RUQ pain 04/24/2016   Microcytic anemia 04/24/2016   Diarrhea 04/24/2016   Elevated sed rate 04/24/2016   Splenomegaly 04/24/2016   Past Medical History:  Diagnosis Date   CHF (congestive heart failure) ([place])    Complication of anesthesia    WOKE UP DURING SURGERY   Diabetes (HCC)    GERD (gastroesophageal reflux disease)    Hemangioma of liver    Hypertension    Sleep apnea    URI (upper respiratory infection)    Family History  Problem Relation Age of Onset   Hypertension Mother    CVA Father    Diabetes Father    Throat cancer Father    Heart disease Brother 23   Cervical cancer Maternal Grandmother    Throat cancer Maternal Grandmother    Throat cancer Maternal Grandfather    Bladder Cancer Maternal Grandfather    Cirrhosis Maternal Grandfather    Colon cancer Neg Hx    Past Surgical History:  Procedure Laterality Date   BIOPSY  05/04/2016   Procedure: BIOPSY;  Surgeon: Daneil Dolin, MD;  [facility];  Service: Endoscopy;;  gastric bx   CARDIAC CATHETERIZATION Bilateral 10/2021   COLONOSCOPY WITH PROPOFOL N/A 05/04/2016    PROPOFOL;  Surgeon: Daneil Dolin, MD; diverticulosis throughout the entire colon, otherwise normal.  Repeat due in 2027.   ESOPHAGOGASTRODUODENOSCOPY (EGD) WITH PROPOFOL N/A 05/04/2016   PROPOFOL;  Surgeon: Daneil Dolin, MD; LA grade B esophagitis,  gastritis, small hiatal hernia.   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

## 2022-10-10 NOTE — Patient Instructions (Signed)

## 2022-10-10 NOTE — Progress Notes (Signed)
Endocrinology Follow Up Note       10/10/2022, 11:24 AM   Subjective:    Patient ID: [name] Gregory, male    DOB: 03-08-1971.  [name] Gregory is being seen in follow up after being seen in consultation for management of currently uncontrolled symptomatic diabetes requested by  Vidal Schwalbe, MD.   Past Medical History:  Diagnosis Date   CHF (congestive heart failure) ([place])    Complication of anesthesia    WOKE UP DURING SURGERY   Diabetes ([place])    GERD (gastroesophageal reflux disease)    Hemangioma of liver    Hypertension    Sleep apnea    URI (upper respiratory infection)     Past Surgical History:  Procedure Laterality Date   BIOPSY  05/04/2016   Procedure: BIOPSY;  Surgeon: Daneil Dolin, MD;  Location: [facility];  Service: Endoscopy;;  gastric bx   CARDIAC CATHETERIZATION Bilateral 10/2021   COLONOSCOPY WITH PROPOFOL N/A 05/04/2016    PROPOFOL;  Surgeon: Daneil Dolin, MD; diverticulosis throughout the entire colon, otherwise normal.  Repeat due in 2027.   ESOPHAGOGASTRODUODENOSCOPY (EGD) WITH PROPOFOL N/A 05/04/2016   PROPOFOL;  Surgeon: Daneil Dolin, MD; LA grade B esophagitis, gastritis, small hiatal hernia.   EYE SURGERY     RIGHT HEART CATH Right 06/28/2022   Procedure: RIGHT HEART CATH;  Surgeon: Jolaine Artist, MD;  Location: [facility];  Service: Cardiovascular;  Laterality: Right;   SALIVARY GLAND SURGERY  1992    Social History   Socioeconomic History   Marital status: Married    Spouse name: Not on file   Number of children: Not on file   Years of education: Not on file   Highest education level: Not on file  Occupational History   Occupation: administrative for [employer]  Tobacco Use   Smoking status: Former    Packs/day: 0.25    Years: 36.00    Total pack years: 9.00    Types: Cigarettes    Quit date: 11/18/2021    Years since quitting: 0.8   Smokeless  tobacco: Never   Tobacco comments:    QUIT SMOKING IN APRIL  Vaping Use   Vaping Use: Every day  Substance and Sexual Activity   Alcohol use: Not Currently    Comment: maybe once a month   Drug use: No   Sexual activity: Not on file  Other Topics Concern   Not on file  Social History Narrative   Not on file   Social Determinants of Health   Financial Resource Strain: Unknown (07/17/2019)   Overall Financial Resource Strain (CARDIA)    Difficulty of Paying Living Expenses: Patient refused  Food Insecurity: Unknown (07/17/2019)   Hunger Vital Sign    Worried About [place] in the Last Year: Patient refused    [place] in the Last Year: Patient refused  Transportation Needs: Unknown (07/17/2019)   PRAPARE - Transportation    Lack of Transportation (Medical): Patient refused    Lack of Transportation (Non-Medical): Patient refused  Physical Activity: Unknown (07/17/2019)   Exercise Vital Sign    Days of Exercise per  Week: Patient refused    Minutes of Exercise per Session: Patient refused  Stress: Not on file  Social Connections: Unknown (07/17/2019)   Social Connection and Isolation Panel [NHANES]    Frequency of Communication with Friends and Family: Patient refused    Frequency of Social Gatherings with Friends and Family: Patient refused    Attends Religious Services: Patient refused    Marine scientist or Organizations: Patient refused    Attends Music therapist: Patient refused    Marital Status: Patient refused    Family History  Problem Relation Age of Onset   Hypertension Mother    CVA Father    Diabetes Father    Throat cancer Father    Heart disease Brother 29   Cervical cancer Maternal Grandmother    Throat cancer Maternal Grandmother    Throat cancer Maternal Grandfather    Bladder Cancer Maternal Grandfather    Cirrhosis Maternal Grandfather    Colon cancer Neg Hx     Outpatient Encounter Medications as of 10/10/2022   Medication Sig   albuterol (VENTOLIN HFA) 108 (90 Base) MCG/ACT inhaler Inhale 2 puffs into the lungs every 6 (six) hours as needed for wheezing or shortness of breath.   atorvastatin (LIPITOR) 20 MG tablet Take 1 tablet (20 mg total) by mouth daily.   buPROPion (ZYBAN) 150 MG 12 hr tablet Take 150 mg by mouth 2 (two) times daily.   carvedilol (COREG) 25 MG tablet Take 1 tablet (25 mg total) by mouth 2 (two) times daily with a meal.   citalopram (CELEXA) 20 MG tablet Take 1 tablet by mouth daily.   gabapentin (NEURONTIN) 300 MG capsule Take 300 mg by mouth 2 (two) times daily. Take 2 capsule  in AM and 1 capsule q PM for nerve pain   JARDIANCE 10 MG TABS tablet Take 1 tablet (10 mg total) by mouth daily.   LORazepam (ATIVAN) 0.5 MG tablet Take 0.5 mg by mouth every 8 (eight) hours as needed for anxiety.    magnesium oxide (MAG-OX) 400 (240 Mg) MG tablet Take 400 mg by mouth daily.   metFORMIN (GLUCOPHAGE) 1000 MG tablet Take 1 tablet (1,000 mg total) by mouth 2 (two) times daily with a meal.   omeprazole (PRILOSEC) 20 MG capsule Take 20 mg by mouth daily.   sacubitril-valsartan (ENTRESTO) 97-103 MG Take 1 tablet by mouth 2 (two) times daily.   Semaglutide, 2 MG/DOSE, 8 MG/3ML SOPN Inject 2 mg as directed once a week.   spironolactone (ALDACTONE) 25 MG tablet Take 25 mg by mouth daily.   torsemide (DEMADEX) 20 MG tablet Take 2 tablets (40 mg total) by mouth 2 (two) times daily. Take extra tab if needed   Vitamin D, Ergocalciferol, (DRISDOL) 50000 units CAPS capsule Take 1 capsule by mouth 3 (three) times a week.   [DISCONTINUED] Semaglutide, 1 MG/DOSE, 4 MG/3ML SOPN Inject 1 mg as directed once a week.   No facility-administered encounter medications on file as of 10/10/2022.    ALLERGIES: No Known Allergies  VACCINATION STATUS: Immunization History  Administered Date(s) Administered   Influenza Split 05/29/2006, 05/14/2016   Influenza,inj,Quad PF,6+ Mos 06/24/2019, 07/01/2020    Influenza-Unspecified 06/13/2011   Moderna Sars-Covid-2 Vaccination 11/12/2019, 12/10/2019   Pneumococcal-Unspecified 06/13/2011   Td 02/04/1991   Tdap 05/10/1996    Diabetes He presents for his follow-up diabetic visit. He has type 2 diabetes mellitus. Onset time: Diagnosed about 8 months ago at age of 52. His disease course has  been stable. There are no hypoglycemic associated symptoms. Associated symptoms include fatigue and foot paresthesias. Pertinent negatives for diabetes include no polyuria. There are no hypoglycemic complications. Symptoms are improving. Diabetic complications include heart disease (CHF), nephropathy and peripheral neuropathy. Risk factors for coronary artery disease include diabetes mellitus, family history, male sex, obesity, hypertension and sedentary lifestyle. Current diabetic treatment includes oral agent (triple therapy) (and Ozempic 1 mg). He is compliant with treatment most of the time. His weight is fluctuating minimally. He is following a generally healthy diet. Meal planning includes avoidance of concentrated sweets. He has not had a previous visit with a dietitian. He rarely participates in exercise. His home blood glucose trend is decreasing steadily. His breakfast blood glucose range is generally 90-110 mg/dl. His bedtime blood glucose range is generally 110-130 mg/dl. (He presents today with no meter or logs (walked out and accidentally left them at home).  His POCT A1c today is 6.8%, increasing from last visit of 5.9%.  His fasting readings have been around 100 and evening readings have been less than 120.  He denies any significant hypoglycemia since stopping the Glipizide last visit.) An ACE inhibitor/angiotensin II receptor blocker is being taken. He does not see a podiatrist.Eye exam is current.    Review of systems  Constitutional: + Minimally fluctuating body weight,  current Body mass index is 41.06 kg/m. , no fatigue, no subjective hyperthermia, no  subjective hypothermia Eyes: no blurry vision, no xerophthalmia ENT: no sore throat, no nodules palpated in throat, no dysphagia/odynophagia, no hoarseness Cardiovascular: no chest pain, no shortness of breath, no palpitations, no leg swelling Respiratory: no cough, + shortness of breath- on portable O2 Gastrointestinal: no nausea/vomiting/diarrhea Musculoskeletal: no muscle/joint aches Skin: no rashes, no hyperemia Neurological: no tremors, no numbness, no tingling, no dizziness Psychiatric: no depression, no anxiety  Objective:     BP 108/76 (BP Location: Left Arm, Patient Position: Sitting, Cuff Size: Large)   Pulse 94   Ht '6\' 2"'$  (1.88 m)   Wt (!) 319 lb 12.8 oz (145.1 kg)   BMI 41.06 kg/m   Wt Readings from Last 3 Encounters:  10/10/22 (!) 319 lb 12.8 oz (145.1 kg)  09/21/22 (!) 319 lb (144.7 kg)  09/14/22 (!) 319 lb (144.7 kg)     BP Readings from Last 3 Encounters:  10/10/22 108/76  09/21/22 104/74  09/14/22 115/81      Physical Exam- Limited  Constitutional:  Body mass index is 41.06 kg/m. , not in acute distress, normal state of mind Eyes:  EOMI, no exophthalmos Musculoskeletal: no gross deformities, strength intact in all four extremities, no gross restriction of joint movements Skin:  no rashes, no hyperemia Neurological: no tremor with outstretched hands    CMP ( most recent) CMP     Component Value Date/Time   NA 135 09/10/2022 1340   K 4.4 09/10/2022 1340   CL 99 09/10/2022 1340   CO2 26 09/10/2022 1340   GLUCOSE 252 (H) 09/10/2022 1340   BUN 16 09/10/2022 1340   BUN 28 (A) 12/29/2021 0000   CREATININE 1.09 09/10/2022 1340   CREATININE 0.72 03/18/2016 1352   CALCIUM 8.6 (L) 09/10/2022 1340   PROT 7.0 07/17/2022 1232   ALBUMIN 3.8 07/17/2022 1232   AST 14 (L) 07/17/2022 1232   ALT 15 07/17/2022 1232   ALKPHOS 105 07/17/2022 1232   BILITOT 0.8 07/17/2022 1232   GFRNONAA >60 09/10/2022 1340   GFRNONAA >89 03/18/2016 1352   GFRAA >60  07/18/2019 0430  GFRAA >89 03/18/2016 1352     Diabetic Labs (most recent): Lab Results  Component Value Date   HGBA1C 6.8 (A) 10/10/2022   HGBA1C 5.9 (A) 07/10/2022   HGBA1C 6.0 (H) 04/06/2022     Lipid Panel ( most recent) Lipid Panel     Component Value Date/Time   CHOL  12/16/2008 0340    185        ATP III CLASSIFICATION:  <200     mg/dL   Desirable  200-239  mg/dL   Borderline High  >=240    mg/dL   High          TRIG 203 (H) 12/16/2008 0340   HDL 37 (L) 12/16/2008 0340   CHOLHDL 5.0 12/16/2008 0340   VLDL 41 (H) 12/16/2008 0340   LDLCALC (H) 12/16/2008 0340    107        Total Cholesterol/HDL:CHD Risk Coronary Heart Disease Risk Table                     Men   Women  1/2 Average Risk   3.4   3.3  Average Risk       5.0   4.4  2 X Average Risk   9.6   7.1  3 X Average Risk  23.4   11.0        Use the calculated Patient Ratio above and the CHD Risk Table to determine the patient's CHD Risk.        ATP III CLASSIFICATION (LDL):  <100     mg/dL   Optimal  100-129  mg/dL   Near or Above                    Optimal  130-159  mg/dL   Borderline  160-189  mg/dL   High  >190     mg/dL   Very High             Assessment & Plan:   1) Type 2 diabetes mellitus without complication, without long-term current use of insulin ([place])  He presents today with no meter or logs (walked out and accidentally left them at home).  His POCT A1c today is 6.8%, increasing from last visit of 5.9%.  His fasting readings have been around 100 and evening readings have been less than 120.  He denies any significant hypoglycemia since stopping the Glipizide last visit.  [name] Gregory has currently uncontrolled symptomatic type 2 DM since 52 years of age.   -Recent labs reviewed.  - I had a long discussion with him about the progressive nature of diabetes and the pathology behind its complications. -his diabetes is complicated by CHF, neuropathy and he remains at a high risk  for more acute and chronic complications which include CAD, CVA, CKD, retinopathy, and neuropathy. These are all discussed in detail with him.  The following Lifestyle Medicine recommendations according to [facility]) were discussed and offered to patient and he agrees to start the journey:  A. Whole Foods, Plant-based plate comprising of fruits and vegetables, plant-based proteins, whole-grain carbohydrates was discussed in detail with the patient.   A list for source of those nutrients were also provided to the patient.  Patient will use only water or unsweetened tea for hydration. B.  The need to stay away from risky substances including alcohol, smoking; obtaining 7 to 9 hours of restorative sleep, at least 150 minutes of moderate intensity exercise  weekly, the importance of healthy social connections,  and stress reduction techniques were discussed. C.  A full color page of  Calorie density of various food groups per pound showing examples of each food groups was provided to the patient.  - Nutritional counseling repeated at each appointment due to patients tendency to fall back in to old habits.  - The patient admits there is a room for improvement in their diet and drink choices. -  Suggestion is made for the patient to avoid simple carbohydrates from their diet including Cakes, Sweet Desserts / Pastries, Ice Cream, Soda (diet and regular), Sweet Tea, Candies, Chips, Cookies, Sweet Pastries, Store Bought Juices, Alcohol in Excess of 1-2 drinks a day, Artificial Sweeteners, Coffee Creamer, and "Sugar-free" Products. This will help patient to have stable blood glucose profile and potentially avoid unintended weight gain.   - I encouraged the patient to switch to unprocessed or minimally processed complex starch and increased protein intake (animal or plant source), fruits, and vegetables.   - Patient is advised to stick to a routine mealtimes to eat 3 meals a day  and avoid unnecessary snacks (to snack only to correct hypoglycemia).  - I have approached him with the following individualized plan to manage his diabetes and patient agrees:   -he is encouraged to start monitoring glucose twice daily, before breakfast and before bed, to log their readings on the clinic sheets provided, and call if glucose is less than 70 or above 300 for 3 tests in a row.  - Adjustment parameters are given to him for hypo and hyperglycemia in writing.  - he is advised to increase his Ozempic to 2 mg SQ weekly, continueJardiance 10 mg po daily and Metformin 1000 mg po twice daily after meals (to help minimize GI SE), therapeutically suitable for patient . - his Glipizide was previously discontinued, risk outweighs benefit for this patient, especially given his low of 40.  He is doing well without this so far.  - Specific targets for  A1c; LDL, HDL, and Triglycerides were discussed with the patient.  2) Blood Pressure /Hypertension:  his blood pressure is controlled to target.   he is advised to continue his current medications including Demadex 40 mg po twice daily, Entresto 49-51 mg po daily, and Coreg 25 mg po twice daily.  3) Lipids/Hyperlipidemia:    Review of his recent lipid panel from 12/26/21 showed controlled LDL at 62 .  he is advised to continue Lipitor 20 mg daily at bedtime.  Side effects and precautions discussed with him.  He had blood work with PCP last week, will send over copy.  4)  Weight/Diet:  his Body mass index is 41.06 kg/m.  -  clearly complicating his diabetes care.   he is a candidate for weight loss. I discussed with him the fact that loss of 5 - 10% of his  current body weight will have the most impact on his diabetes management.  Exercise, and detailed carbohydrates information provided  -  detailed on discharge instructions.  5) Chronic Care/Health Maintenance: -he is on ACEI/ARB and Statin medications and is encouraged to initiate and continue  to follow up with Ophthalmology, Dentist, Podiatrist at least yearly or according to recommendations, and advised to stay away from smoking. I have recommended yearly flu vaccine and pneumonia vaccine at least every 5 years; moderate intensity exercise for up to 150 minutes weekly; and sleep for at least 7 hours a day.  - he is advised to  maintain close follow up with Vidal Schwalbe, MD for primary care needs, as well as his other providers for optimal and coordinated care.     I spent  27  minutes in the care of the patient today including review of labs from [place], Lipids, Thyroid Function, Hematology (current and previous including abstractions from other facilities); face-to-face time discussing  his blood glucose readings/logs, discussing hypoglycemia and hyperglycemia episodes and symptoms, medications doses, his options of short and long term treatment based on the latest standards of care / guidelines;  discussion about incorporating lifestyle medicine;  and documenting the encounter. Risk reduction counseling performed per USPSTF guidelines to reduce obesity and cardiovascular risk factors.     Please refer to Patient Instructions for Blood Glucose Monitoring and Insulin/Medications Dosing Guide"  in media tab for additional information. Please  also refer to " Patient Self Inventory" in the Media  tab for reviewed elements of pertinent patient history.  [name] Gregory participated in the discussions, expressed understanding, and voiced agreement with the above plans.  All questions were answered to his satisfaction. he is encouraged to contact clinic should he have any questions or concerns prior to his return visit.     Follow up plan: - Return in about 3 months (around 01/08/2023) for Diabetes F/U with A1c in office, No previsit labs, Bring meter and logs.    Rayetta Pigg, [facility] [address] [place], [place] [address] Phone:  [phone] Fax: [phone]  10/10/2022, 11:24 AM

## 2022-10-10 NOTE — Progress Notes (Signed)
Right shoulder pain for 3-4 weeks No injury Tylenol for pain  Low back pain for years Left leg radic No history of surgery; but 15 years ago had ESI

## 2022-10-12 ENCOUNTER — Encounter (INDEPENDENT_AMBULATORY_CARE_PROVIDER_SITE_OTHER): Payer: Self-pay

## 2022-10-13 ENCOUNTER — Other Ambulatory Visit (INDEPENDENT_AMBULATORY_CARE_PROVIDER_SITE_OTHER): Payer: Self-pay | Admitting: Nurse Practitioner

## 2022-10-13 DIAGNOSIS — G629 Polyneuropathy, unspecified: Secondary | ICD-10-CM

## 2022-10-16 ENCOUNTER — Encounter: Payer: Self-pay | Admitting: Cardiology

## 2022-10-16 ENCOUNTER — Ambulatory Visit: Payer: PRIVATE HEALTH INSURANCE | Attending: Cardiology | Admitting: Cardiology

## 2022-10-16 ENCOUNTER — Telehealth: Payer: Self-pay

## 2022-10-16 VITALS — BP 117/86 | HR 95 | Ht 74.0 in | Wt 312.0 lb

## 2022-10-16 DIAGNOSIS — I428 Other cardiomyopathies: Secondary | ICD-10-CM

## 2022-10-16 DIAGNOSIS — I10 Essential (primary) hypertension: Secondary | ICD-10-CM | POA: Diagnosis not present

## 2022-10-16 NOTE — Patient Instructions (Signed)
Medication Instructions:   DECREASE Spirolactone - take half tablet (12.5 mg) by mouth daily.   *If you need a refill on your cardiac medications before your next appointment, please call your pharmacy*   Lab Work:  None Ordered  If you have labs (blood work) drawn today and your tests are completely normal, you will receive your results only by: [place] (if you have MyChart) OR A paper copy in the mail If you have any lab test that is abnormal or we need to change your treatment, we will call you to review the results.   Testing/Procedures:  None Ordered   Follow-Up: At [facility], you and your health needs are our priority.  As part of our continuing mission to provide you with exceptional heart care, we have created designated Provider Care Teams.  These Care Teams include your primary Cardiologist (physician) and Advanced Practice Providers (APPs -  Physician Assistants and Nurse Practitioners) who all work together to provide you with the care you need, when you need it.  We recommend signing up for the patient portal called "MyChart".  Sign up information is provided on this After Visit Summary.  MyChart is used to connect with patients for Virtual Visits (Telemedicine).  Patients are able to view lab/test results, encounter notes, upcoming appointments, etc.  Non-urgent messages can be sent to your provider as well.   To learn more about what you can do with MyChart, go to [email or web address].    Your next appointment:   6 - 8 week(s)  Provider:   You may see Kate Sable, MD or one of the following Advanced Practice Providers on your designated Care Team:   Murray Hodgkins, NP Christell Faith, PA-C Cadence Kathlen Mody, PA-C Gerrie Nordmann, NP

## 2022-10-16 NOTE — Telephone Encounter (Signed)
PA request received via CMM for Ozempic (1 MG/DOSE) '4MG'$ /3ML pen-injectors  PA submitted to [employer] and is pending determination.  Key: [place] Stall

## 2022-10-16 NOTE — Progress Notes (Signed)
Cardiology Office Note:    Date:  10/16/2022   ID:  [name] Gregory, DOB 10/30/1970, MRN [id number]  PCP:  Vidal Schwalbe, [place] Providers Cardiologist:  Kate Sable, MD     Referring MD: Vidal Schwalbe, MD   Chief Complaint  Patient presents with   Follow-up    6 month f/u, having dizzy episodes w/syncope     History of Present Illness:    [name] Gregory is a 52 y.o. male with a hx of NICM, last EF 20 to 25%, hypertension, diabetes, OSA/CPAP, former smoker who presents to for follow-up.  being seen due to cardiomyopathy and medication titration.  Compliant with medication use as prescribed, has been very dizzy over the past month leading to falls 3 times.  Following usually occurs when patient is standing, he states feeling dizzy when standing from seated position.  Followed up with PCP and ENT, workup for vertigo unrevealing.  Prior notes CMR 11/23 EF 52% Echo 7/23 EF 45 to 50% Outside records/reports from [facility] Echo 11/18/2020 EF 20 to 25% Left heart cath 11/18/2021 no evidence for CAD Brother has history of CHF, died at age 38  Past Medical History:  Diagnosis Date   CHF (congestive heart failure) (HCC)    Complication of anesthesia    WOKE UP DURING SURGERY   Diabetes ([place])    GERD (gastroesophageal reflux disease)    Hemangioma of liver    Hypertension    Sleep apnea    URI (upper respiratory infection)     Past Surgical History:  Procedure Laterality Date   BIOPSY  05/04/2016   Procedure: BIOPSY;  Surgeon: Daneil Dolin, MD;  [facility];  Service: Endoscopy;;  gastric bx   CARDIAC CATHETERIZATION Bilateral 10/2021   COLONOSCOPY WITH PROPOFOL N/A 05/04/2016    PROPOFOL;  Surgeon: Daneil Dolin, MD; diverticulosis throughout the entire colon, otherwise normal.  Repeat due in 2027.   ESOPHAGOGASTRODUODENOSCOPY (EGD) WITH PROPOFOL N/A 05/04/2016   PROPOFOL;  Surgeon: Daneil Dolin, MD; LA grade B esophagitis, gastritis, small  hiatal hernia.   EYE SURGERY     RIGHT HEART CATH Right 06/28/2022   Procedure: RIGHT HEART CATH;  Surgeon: Jolaine Artist, MD;  Location: [place] CV LAB;  Service: Cardiovascular;  Laterality: Right;   SALIVARY GLAND SURGERY  1992    Current Medications: Current Meds  Medication Sig   albuterol (VENTOLIN HFA) 108 (90 Base) MCG/ACT inhaler Inhale 2 puffs into the lungs every 6 (six) hours as needed for wheezing or shortness of breath.   atorvastatin (LIPITOR) 20 MG tablet Take 1 tablet (20 mg total) by mouth daily.   buPROPion (ZYBAN) 150 MG 12 hr tablet Take 150 mg by mouth 2 (two) times daily.   carvedilol (COREG) 25 MG tablet Take 1 tablet (25 mg total) by mouth 2 (two) times daily with a meal.   citalopram (CELEXA) 20 MG tablet Take 1 tablet by mouth daily.   gabapentin (NEURONTIN) 300 MG capsule Take 300 mg by mouth 2 (two) times daily. Take 2 capsule  in AM and 1 capsule q PM for nerve pain   JARDIANCE 10 MG TABS tablet Take 1 tablet (10 mg total) by mouth daily.   LORazepam (ATIVAN) 0.5 MG tablet Take 0.5 mg by mouth every 8 (eight) hours as needed for anxiety.    magnesium oxide (MAG-OX) 400 (240 Mg) MG tablet Take 400 mg by mouth daily.   meloxicam (MOBIC) 15  MG tablet Take 1 tablet (15 mg total) by mouth daily.   metFORMIN (GLUCOPHAGE) 1000 MG tablet Take 1 tablet (1,000 mg total) by mouth 2 (two) times daily with a meal.   omeprazole (PRILOSEC) 20 MG capsule Take 20 mg by mouth daily.   sacubitril-valsartan (ENTRESTO) 97-103 MG Take 1 tablet by mouth 2 (two) times daily.   Semaglutide, 2 MG/DOSE, 8 MG/3ML SOPN Inject 2 mg as directed once a week.   spironolactone (ALDACTONE) 25 MG tablet Take 12.5 mg by mouth daily.   torsemide (DEMADEX) 20 MG tablet Take 2 tablets (40 mg total) by mouth 2 (two) times daily. Take extra tab if needed   Vitamin D, Ergocalciferol, (DRISDOL) 50000 units CAPS capsule Take 1 capsule by mouth 3 (three) times a week.     Allergies:    Patient has no known allergies.   Social History   Socioeconomic History   Marital status: Married    Spouse name: Not on file   Number of children: Not on file   Years of education: Not on file   Highest education level: Not on file  Occupational History   Occupation: administrative for rest [employer]  Tobacco Use   Smoking status: Former    Packs/day: 0.25    Years: 36.00    Total pack years: 9.00    Types: Cigarettes    Quit date: 11/18/2021    Years since quitting: 0.9   Smokeless tobacco: Never   Tobacco comments:    QUIT SMOKING IN APRIL  Vaping Use   Vaping Use: Every day  Substance and Sexual Activity   Alcohol use: Not Currently    Comment: maybe once a month   Drug use: No   Sexual activity: Not on file  Other Topics Concern   Not on file  Social History Narrative   Not on file   Social Determinants of Health   Financial Resource Strain: Unknown (07/17/2019)   Overall Financial Resource Strain (CARDIA)    Difficulty of Paying Living Expenses: Patient refused  Food Insecurity: Unknown (07/17/2019)   Hunger Vital Sign    Worried About [place] in the Last Year: Patient refused    [place] in the Last Year: Patient refused  Transportation Needs: Unknown (07/17/2019)   [place] - Hydrologist (Medical): Patient refused    Lack of Transportation (Non-Medical): Patient refused  Physical Activity: Unknown (07/17/2019)   Exercise Vital Sign    Days of Exercise per Week: Patient refused    Minutes of Exercise per Session: Patient refused  Stress: Not on file  Social Connections: Unknown (07/17/2019)   Social Connection and Isolation Panel [NHANES]    Frequency of Communication with Friends and Family: Patient refused    Frequency of Social Gatherings with Friends and Family: Patient refused    Attends Religious Services: Patient refused    Marine scientist or Organizations: Patient refused    Attends Programme researcher, broadcasting/film/video: Patient refused    Marital Status: Patient refused     Family History: The patient's family history includes Bladder Cancer in his maternal grandfather; CVA in his father; Cervical cancer in his maternal grandmother; Cirrhosis in his maternal grandfather; Diabetes in his father; Heart disease (age of onset: 18) in his brother; Hypertension in his mother; Throat cancer in his father, maternal grandfather, and maternal grandmother. There is no history of Colon cancer.  ROS:   Please see the history of  present illness.     All other systems reviewed and are negative.  EKGs/Labs/Other Studies Reviewed:    The following studies were reviewed today:   EKG:  EKG not ordered today.    Recent Labs: 07/17/2022: ALT 15 09/10/2022: B Natriuretic Peptide 12.7; BUN 16; Creatinine, Ser 1.09; Hemoglobin 12.9; Platelets 298; Potassium 4.4; Sodium 135  Recent Lipid Panel    Component Value Date/Time   CHOL  12/16/2008 0340    185        ATP III CLASSIFICATION:  <200     mg/dL   Desirable  200-239  mg/dL   Borderline High  >=240    mg/dL   High          TRIG 203 (H) 12/16/2008 0340   HDL 37 (L) 12/16/2008 0340   CHOLHDL 5.0 12/16/2008 0340   VLDL 41 (H) 12/16/2008 0340   LDLCALC (H) 12/16/2008 0340    107        Total Cholesterol/HDL:CHD Risk Coronary Heart Disease Risk Table                     Men   Women  1/2 Average Risk   3.4   3.3  Average Risk       5.0   4.4  2 X Average Risk   9.6   7.1  3 X Average Risk  23.4   11.0        Use the calculated Patient Ratio above and the CHD Risk Table to determine the patient's CHD Risk.        ATP III CLASSIFICATION (LDL):  <100     mg/dL   Optimal  100-129  mg/dL   Near or Above                    Optimal  130-159  mg/dL   Borderline  160-189  mg/dL   High  >190     mg/dL   Very High     Risk Assessment/Calculations:          Physical Exam:    VS:  BP 117/86 (BP Location: Left Arm, Patient Position:  Sitting, Cuff Size: Large)   Pulse 95   Ht '6\' 2"'$  (1.88 m)   Wt (!) 312 lb (141.5 kg)   SpO2 98%   BMI 40.06 kg/m     Wt Readings from Last 3 Encounters:  10/16/22 (!) 312 lb (141.5 kg)  10/10/22 (!) 319 lb 12.8 oz (145.1 kg)  09/21/22 (!) 319 lb (144.7 kg)     GEN:  Well nourished, well developed in no acute distress HEENT: Normal NECK: No JVD; No carotid bruits LYMPHATICS: No lymphadenopathy CARDIAC: RRR, no murmurs, rubs, gallops RESPIRATORY:  Clear to auscultation without rales, wheezing or rhonchi  ABDOMEN: Soft, non-tender, non-distended MUSCULOSKELETAL:  No edema; No deformity  SKIN: Warm and dry NEUROLOGIC:  Alert and oriented x 3 PSYCHIATRIC:  Normal affect   ASSESSMENT:    1. NICM (nonischemic cardiomyopathy) ([place])   2. Primary hypertension    PLAN:    In order of problems listed above:  Nonischemic cardiomyopathy, etiology possibly hereditary (brother with HF at age 56).  Initial echo 20 to 25%, last echocardiogram 7/23  EF 45 to 50%.  CMR 11/23 EF 52%.  Dizziness, leading to falls.  Reduce Aldactone to 12.5 mg daily.  Continue Coreg 25 twice daily, Entresto 97-103 milligrams twice daily, Jardiance.  Appreciate input from heart failure service.  Appears  euvolemic. Hypertension, BP controlled.Continue Coreg, Entresto, Aldactone as above.  Follow-up in 6 to 8 weeks.     Medication Adjustments/Labs and Tests Ordered: Current medicines are reviewed at length with the patient today.  Concerns regarding medicines are outlined above.  Orders Placed This Encounter  Procedures   EKG 12-Lead   No orders of the defined types were placed in this encounter.   Patient Instructions  Medication Instructions:   DECREASE Spirolactone - take half tablet (12.5 mg) by mouth daily.   *If you need a refill on your cardiac medications before your next appointment, please call your pharmacy*   Lab Work:  None Ordered  If you have labs (blood work) drawn today and your  tests are completely normal, you will receive your results only by: [place] (if you have MyChart) OR A paper copy in the mail If you have any lab test that is abnormal or we need to change your treatment, we will call you to review the results.   Testing/Procedures:  None Ordered   Follow-Up: At [facility], you and your health needs are our priority.  As part of our continuing mission to provide you with exceptional heart care, we have created designated Provider Care Teams.  These Care Teams include your primary Cardiologist (physician) and Advanced Practice Providers (APPs -  Physician Assistants and Nurse Practitioners) who all work together to provide you with the care you need, when you need it.  We recommend signing up for the patient portal called "MyChart".  Sign up information is provided on this After Visit Summary.  MyChart is used to connect with patients for Virtual Visits (Telemedicine).  Patients are able to view lab/test results, encounter notes, upcoming appointments, etc.  Non-urgent messages can be sent to your provider as well.   To learn more about what you can do with MyChart, go to [email or web address].    Your next appointment:   6 - 8 week(s)  Provider:   You may see Kate Sable, MD or one of the following Advanced Practice Providers on your designated Care Team:   Murray Hodgkins, NP Christell Faith, PA-C Cadence Kathlen Mody, PA-C Gerrie Nordmann, NP   Signed, Kate Sable, MD  10/16/2022 11:01 AM    [place]

## 2022-10-23 ENCOUNTER — Encounter: Payer: Self-pay | Admitting: Nurse Practitioner

## 2022-10-23 NOTE — Telephone Encounter (Signed)
Received fax that additional information/documentation was needed. Faxed recent chart notes to [place]. [phone] However, I saw that the initial request was sent for '1mg'$  dose, but it looks like '2mg'$  dose has been ordered now. I'm not sure if they do an all inclusive approval or if we'll have to do another request. Can you confirm which dose the pt should be on?

## 2022-10-23 NOTE — Telephone Encounter (Signed)
I just increased his dose to the 2 mg at last visit 2/27, he was still working through some of his 1 mg doses he still had.

## 2022-10-24 ENCOUNTER — Other Ambulatory Visit (HOSPITAL_COMMUNITY): Payer: Self-pay

## 2022-10-30 ENCOUNTER — Other Ambulatory Visit (HOSPITAL_COMMUNITY): Payer: Self-pay

## 2022-10-30 NOTE — Telephone Encounter (Signed)
Will you let him know his med was approved?  He may need to download a coupon from the internet to help with high copay.

## 2022-10-30 NOTE — Telephone Encounter (Signed)
Pharmacy Patient Advocate Encounter  Prior Authorization for Ozempic has been approved by [employer] (ins).   1mg  was approved, but I did a test claim for the 2mg  as well and got the same result.   Effective dates:  through 10/26/23  Copay is $200- I did not call the pharmacy due to cost.

## 2022-11-01 NOTE — Telephone Encounter (Signed)
Patient called, and a voice mail was left asking the patient to return call to office.

## 2022-11-02 NOTE — Telephone Encounter (Signed)
Talked with the patient. He states that the [facility] is having trouble processing the coupons that would go toward the price of the Ozempic. He is going to call [employer] in [place] and see if they might could apply a coupon to the refill of the [place]. He will call our office back.

## 2022-11-14 ENCOUNTER — Encounter: Payer: Self-pay | Admitting: Sports Medicine

## 2022-11-14 ENCOUNTER — Other Ambulatory Visit: Payer: Self-pay

## 2022-11-14 ENCOUNTER — Ambulatory Visit (INDEPENDENT_AMBULATORY_CARE_PROVIDER_SITE_OTHER): Payer: Self-pay | Admitting: Sports Medicine

## 2022-11-14 ENCOUNTER — Other Ambulatory Visit (INDEPENDENT_AMBULATORY_CARE_PROVIDER_SITE_OTHER): Payer: PRIVATE HEALTH INSURANCE

## 2022-11-14 DIAGNOSIS — M542 Cervicalgia: Secondary | ICD-10-CM

## 2022-11-14 DIAGNOSIS — M4302 Spondylolysis, cervical region: Secondary | ICD-10-CM

## 2022-11-14 DIAGNOSIS — M7521 Bicipital tendinitis, right shoulder: Secondary | ICD-10-CM | POA: Diagnosis not present

## 2022-11-14 DIAGNOSIS — M25511 Pain in right shoulder: Secondary | ICD-10-CM

## 2022-11-14 DIAGNOSIS — G8929 Other chronic pain: Secondary | ICD-10-CM | POA: Diagnosis not present

## 2022-11-14 DIAGNOSIS — M5136 Other intervertebral disc degeneration, lumbar region: Secondary | ICD-10-CM

## 2022-11-14 MED ORDER — LIDOCAINE HCL 1 % IJ SOLN
2.0000 mL | INTRAMUSCULAR | Status: AC | PRN
  Administered 2022-11-14: 2 mL

## 2022-11-14 MED ORDER — METHYLPREDNISOLONE ACETATE 40 MG/ML IJ SUSP
40.0000 mg | INTRAMUSCULAR | Status: AC | PRN
  Administered 2022-11-14: 40 mg via INTRA_ARTICULAR

## 2022-11-14 MED ORDER — BUPIVACAINE HCL 0.25 % IJ SOLN
2.0000 mL | INTRAMUSCULAR | Status: AC | PRN
  Administered 2022-11-14: 2 mL via INTRA_ARTICULAR

## 2022-11-14 NOTE — Progress Notes (Signed)
Patient states the injection did nothing for pain. Taking meloxicam but has not noticed much relief taking it.  Feels weak, and feels like it goes down his arm.  Has not done PT; states they never contacted him .

## 2022-11-14 NOTE — Progress Notes (Signed)
[name] Gregory - 52 y.o. male MRN [id number]  Date of birth: 12/20/1970  Office Visit Note: Visit Date: 11/14/2022 PCP: Vidal Schwalbe, MD Referred by: Vidal Schwalbe, MD  Subjective: Chief Complaint  Patient presents with   Right Shoulder - Pain, Follow-up   HPI: [name] Gregory is a pleasant 52 y.o. male who presents today for follow-up of right shoulder pain, back and neck pain.  Right shoulder - [name] Gregory continues with right shoulder pain.  We did do a subacromial joint injection on 10/10/2022, feels like this did not help his pain much.  If he has a little more motion with less guarding, but in general still having pain, more so over the anterior shoulder joint.  Is taking meloxicam 15 mg.  Does feel like his pain radiates down the arm, denies numbness and tingling but does admit to radiation that goes down the forearm and sometimes into the fingers, he is unsure which fingers.  Neck/Back -at times does have some neck pain, unsure if this is related.  In terms of his back, some mild improvement with meloxicam.  We did send a referral to formalized physical therapy for both the back and the shoulder, although patient states he was never contacted.  Upon chart review, it does appear the referral was faxed to [facility].  Pertinent ROS were reviewed with the patient and found to be negative unless otherwise specified above in HPI.   Assessment & Plan: Visit Diagnoses:  1. Chronic right shoulder pain   2. Neck pain   3. Biceps tendinitis of right upper extremity   4. Spondylolysis, cervical region   5. DDD (degenerative disc disease), lumbar    Plan: Discussed with [name] Gregory today the nature of his right shoulder pain.  Unfortunately he did not get much relief from the subacromial joint injection, his pain today is more so over the anterior shoulder joint and the biceps tendon.  His pain is slightly complicated by the fact that he has some radicular pain (without N/T) that goes down the  forearm and sometimes into the hand.  He does have neuropathy at baseline given his diabetes.  We discussed all treatment options such as injection therapy, advanced imaging such as MRI, EMG/nerve conduction study.  Through shared decision-making, elected to proceed with ultrasound-guided glenohumeral joint injection both for diagnostic and therapeutic purposes.  If this is coming from the shoulder joint and/or biceps tendon, I would expect his pain to significantly improve over the next 1-2 weeks.  He will call or message me on MyChart in 2 weeks to let me know his response to this and if he is having any radiating pain he will let me know which fingers it is going into.  If for some reason the injection is not helpful, we would consider additional workup, likely proceeding with EMG/nerve conduction studies first before MRI of the shoulder or the neck.  I do think it is critical to get him into formalized physical therapy both for the shoulder and his low back given his DDD -I did check on his referral which had been processed, we will have my office staff reach out to [place] and patient to help organize this referral for him.  I would like him to continue on the meloxicam 15 mg once daily for both the shoulder and the low back.  Follow-up: He will call/message me via MyChart in 2 weeks to let me know how the shoulder is doing   Meds &  Orders: No orders of the defined types were placed in this encounter.   Orders Placed This Encounter  Procedures   Large Joint Inj: R glenohumeral   XR Cervical Spine 2 or 3 views   US Guided Needle Placement - No Linked Charges     Procedures: Large Joint Inj: R glenohumeral on 11/14/2022 1:37 PM Indications: pain Details: 22 G 3.5 in needle, ultrasound-guided posterior approach Medications: 2 mL lidocaine 1 %; 2 mL bupivacaine 0.25 %; 40 mg methylPREDNISolone acetate 40 MG/ML Outcome: tolerated well, no immediate complications  US-guided glenohumeral  joint injection, right shoulder After discussion on risks/benefits/indications, informed verbal consent was obtained. A timeout was then performed. The patient was positioned lying lateral recumbent on examination table. The patient's shoulder was prepped with betadine and multiple alcohol swabs and utilizing ultrasound guidance, the patient's glenohumeral joint was identified on ultrasound. Using ultrasound guidance a 22-gauge, 3.5 inch needle with a mixture of 2:2:1 cc's lidocaine:bupivicaine:depomedrol was directed from a lateral to medial direction via in-plane technique into the glenohumeral joint with visualization of appropriate spread of injectate into the joint. Patient tolerated the procedure well without immediate complications.      Procedure, treatment alternatives, risks and benefits explained, specific risks discussed. Consent was given by the patient. Immediately prior to procedure a time out was called to verify the correct patient, procedure, equipment, support staff and site/side marked as required. Patient was prepped and draped in the usual sterile fashion.          Clinical History: No specialty comments available.  He reports that he quit smoking about a year ago. His smoking use included cigarettes. He has a 9.00 pack-year smoking history. He has never used smokeless tobacco.  Recent Labs    04/06/22 0951 07/10/22 1453 10/10/22 1115  HGBA1C 6.0* 5.9* 6.8*   Objective:    Physical Exam  Gen: Well-appearing, in no acute distress; non-toxic CV:  Well-perfused. Warm.  Resp: Breathing unlabored on portable oxygen no wheezing. Psych: Fluid speech in conversation; appropriate affect; normal thought process Neuro: Sensation intact throughout. No gross coordination deficits.   Ortho Exam - Right shoulder: + TTP within anterior shoulder joint and bicipital groove. No true pain at Codman's point.  There is some mild pain and restriction at endrange external rotation.   Positive Hawkins and O'Brien's testing, positive speeds test.  There is no true weakness with rotator cuff testing, although some mild guarding.  - Cervical spine: Normal flexion/Extension with FROM. 5/5 strength of Ue's. Negative Spurling's test. Some mild pain with gripping on R > L (no weakness)  Imaging: XR Cervical Spine 2 or 3 views  Result Date: 11/14/2022 2 views of the cervical spine including AP and lateral film were ordered and reviewed by myself.  X-rays demonstrate no significant scoliosis.  There is rather significant anterior spurring over C5 and to a lesser degree C6.  Good alignment of the spine, there is some mild intervertebral disc space loss between C5-C6, otherwise preserved.   XR Shoulder Right 4 views of the right shoulder including AP, Grashey, scapular Y and  axillary views were ordered and reviewed by myself.  There is mild  glenohumeral joint arthritic change.  Moderate AC joint arthritis.  No  acute fracture or evidence of bony abnormality noted.  Past Medical/Family/Surgical/Social History: Medications & Allergies reviewed per EMR, new medications updated. Patient Active Problem List   Diagnosis Date Noted   Chronic respiratory failure with hypoxia 01/02/2022   HTN (hypertension) 06/10/2020  Loose stools 06/09/2020   GERD (gastroesophageal reflux disease) 06/09/2020   Elevated LFTs 06/09/2020   Early satiety 06/09/2020   Fatty liver 06/09/2020   Dizziness    Hyperglycemia    Liver enzyme elevation    Nausea vomiting and diarrhea    Transaminitis    AKI (acute kidney injury) 07/17/2019   Abdominal pain 07/17/2019   Nasal congestion 07/17/2019   Post-nasal drainage 07/17/2019   OSA on CPAP 06/14/2017   Morbid (severe) obesity due to excess calories 06/14/2017   Abdominal pain, epigastric 04/24/2016   RUQ pain 04/24/2016   Microcytic anemia 04/24/2016   Diarrhea 04/24/2016   Elevated sed rate 04/24/2016   Splenomegaly 04/24/2016   Past Medical  History:  Diagnosis Date   CHF (congestive heart failure)    Complication of anesthesia    WOKE UP DURING SURGERY   Diabetes    GERD (gastroesophageal reflux disease)    Hemangioma of liver    Hypertension    Sleep apnea    URI (upper respiratory infection)    Family History  Problem Relation Age of Onset   Hypertension Mother    CVA Father    Diabetes Father    Throat cancer Father    Heart disease Brother 46   Cervical cancer Maternal Grandmother    Throat cancer Maternal Grandmother    Throat cancer Maternal Grandfather    Bladder Cancer Maternal Grandfather    Cirrhosis Maternal Grandfather    Colon cancer Neg Hx    Past Surgical History:  Procedure Laterality Date   BIOPSY  05/04/2016   Procedure: BIOPSY;  Surgeon: Daneil Dolin, MD;  [facility];  Service: Endoscopy;;  gastric bx   CARDIAC CATHETERIZATION Bilateral 10/2021   COLONOSCOPY WITH PROPOFOL N/A 05/04/2016    PROPOFOL;  Surgeon: Daneil Dolin, MD; diverticulosis throughout the entire colon, otherwise normal.  Repeat due in 2027.   ESOPHAGOGASTRODUODENOSCOPY (EGD) WITH PROPOFOL N/A 05/04/2016   PROPOFOL;  Surgeon: Daneil Dolin, MD; LA grade B esophagitis, gastritis, small hiatal hernia.   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

## 2022-11-24 ENCOUNTER — Encounter: Payer: Self-pay | Admitting: Emergency Medicine

## 2022-11-26 ENCOUNTER — Encounter: Payer: Self-pay | Admitting: Sports Medicine

## 2022-11-27 ENCOUNTER — Other Ambulatory Visit: Payer: Self-pay | Admitting: Sports Medicine

## 2022-11-27 ENCOUNTER — Ambulatory Visit: Payer: PRIVATE HEALTH INSURANCE | Admitting: Cardiology

## 2022-11-27 DIAGNOSIS — M542 Cervicalgia: Secondary | ICD-10-CM

## 2022-12-01 ENCOUNTER — Encounter: Payer: Self-pay | Admitting: Cardiology

## 2022-12-01 ENCOUNTER — Ambulatory Visit: Payer: PRIVATE HEALTH INSURANCE | Attending: Cardiology | Admitting: Cardiology

## 2022-12-01 VITALS — BP 104/74 | HR 79 | Ht 74.0 in | Wt 319.4 lb

## 2022-12-01 DIAGNOSIS — I10 Essential (primary) hypertension: Secondary | ICD-10-CM | POA: Diagnosis not present

## 2022-12-01 DIAGNOSIS — I428 Other cardiomyopathies: Secondary | ICD-10-CM

## 2022-12-01 MED ORDER — TORSEMIDE 20 MG PO TABS: 60.0000 mg | ORAL_TABLET | Freq: Two times a day (BID) | ORAL | 0 refills | Status: DC

## 2022-12-01 NOTE — Progress Notes (Signed)
Cardiology Office Note:    Date:  12/01/2022   ID:  [name] Gregory, DOB Jun 02, 1971, MRN [id number]  PCP:  Smith Robert, MD (Inactive)   [facility] Providers Cardiologist:  Debbe Odea, MD     Referring MD: Smith Robert, MD   Chief Complaint  Patient presents with   Follow-up    Patient reports mid chest pressure/heaviness for the past week and has a 6 lb wt loss as well.  The pain does radiate down left arm.      History of Present Illness:    [name] Gregory is a 52 y.o. male with a hx of NICM, last EF 20 to 25%, hypertension, diabetes, OSA/CPAP, former smoker who presents to for follow-up.   Has been feeling more short of breath over the past week, also has chest heaviness.  Previously states being dizzy all the time, Aldactone was reduced to 12.5 mg daily, dizziness has improved.  Now only other cause for brief seconds when he stands.  Endorses gaining about 6 to 7 pounds over the past week.  Compliant with medications including torsemide as prescribed.   Prior notes CMR 11/23 EF 52% Echo 7/23 EF 45 to 50% Outside records/reports from [facility] Echo 11/18/2020 EF 20 to 25% Left heart cath 11/18/2021 no evidence for CAD Brother has history of CHF, died at age 65  Past Medical History:  Diagnosis Date   CHF (congestive heart failure)    Complication of anesthesia    WOKE UP DURING SURGERY   Diabetes    GERD (gastroesophageal reflux disease)    Hemangioma of liver    Hypertension    Sleep apnea    URI (upper respiratory infection)     Past Surgical History:  Procedure Laterality Date   BIOPSY  05/04/2016   Procedure: BIOPSY;  Surgeon: Corbin Ade, MD;  Location: [facility];  Service: Endoscopy;;  gastric bx   CARDIAC CATHETERIZATION Bilateral 10/2021   COLONOSCOPY WITH PROPOFOL N/A 05/04/2016    PROPOFOL;  Surgeon: Corbin Ade, MD; diverticulosis throughout the entire colon, otherwise normal.  Repeat due in 2027.   ESOPHAGOGASTRODUODENOSCOPY (EGD)  WITH PROPOFOL N/A 05/04/2016   PROPOFOL;  Surgeon: Corbin Ade, MD; LA grade B esophagitis, gastritis, small hiatal hernia.   EYE SURGERY     RIGHT HEART CATH Right 06/28/2022   Procedure: RIGHT HEART CATH;  Surgeon: Dolores Patty, MD;  Location: [facility] INVASIVE CV LAB;  Service: Cardiovascular;  Laterality: Right;   SALIVARY GLAND SURGERY  1992    Current Medications: Current Meds  Medication Sig   albuterol (VENTOLIN HFA) 108 (90 Base) MCG/ACT inhaler Inhale 2 puffs into the lungs every 6 (six) hours as needed for wheezing or shortness of breath.   atorvastatin (LIPITOR) 20 MG tablet Take 1 tablet (20 mg total) by mouth daily.   buPROPion (ZYBAN) 150 MG 12 hr tablet Take 150 mg by mouth 2 (two) times daily.   carvedilol (COREG) 25 MG tablet Take 1 tablet (25 mg total) by mouth 2 (two) times daily with a meal.   citalopram (CELEXA) 20 MG tablet Take 1 tablet by mouth daily.   dicyclomine (BENTYL) 10 MG capsule Take 10 mg by mouth in the morning and at bedtime.   gabapentin (NEURONTIN) 300 MG capsule Take 300 mg by mouth 2 (two) times daily. Take 2 capsule  in AM and 1 capsule q PM for nerve pain   JARDIANCE 10 MG TABS tablet Take 1 tablet (10  mg total) by mouth daily.   LORazepam (ATIVAN) 0.5 MG tablet Take 0.5 mg by mouth every 8 (eight) hours as needed for anxiety.    magnesium oxide (MAG-OX) 400 (240 Mg) MG tablet Take 400 mg by mouth daily.   meloxicam (MOBIC) 15 MG tablet Take 1 tablet (15 mg total) by mouth daily.   metFORMIN (GLUCOPHAGE) 1000 MG tablet Take 1 tablet (1,000 mg total) by mouth 2 (two) times daily with a meal.   omeprazole (PRILOSEC) 20 MG capsule Take 20 mg by mouth daily.   sacubitril-valsartan (ENTRESTO) 97-103 MG Take 1 tablet by mouth 2 (two) times daily.   Semaglutide, 2 MG/DOSE, 8 MG/3ML SOPN Inject 2 mg as directed once a week.   spironolactone (ALDACTONE) 25 MG tablet Take 12.5 mg by mouth daily.   Vitamin D, Ergocalciferol, (DRISDOL) 50000 units CAPS  capsule Take 1 capsule by mouth 3 (three) times a week.   [DISCONTINUED] torsemide (DEMADEX) 20 MG tablet Take 2 tablets (40 mg total) by mouth 2 (two) times daily. Take extra tab if needed     Allergies:   Patient has no known allergies.   Social History   Socioeconomic History   Marital status: Married    Spouse name: Not on file   Number of children: Not on file   Years of education: Not on file   Highest education level: Not on file  Occupational History   Occupation: administrative for [employer]  Tobacco Use   Smoking status: Former    Packs/day: 0.25    Years: 36.00    Additional pack years: 0.00    Total pack years: 9.00    Types: Cigarettes    Quit date: 11/18/2021    Years since quitting: 1.0   Smokeless tobacco: Never   Tobacco comments:    QUIT SMOKING IN APRIL  Vaping Use   Vaping Use: Every day  Substance and Sexual Activity   Alcohol use: Not Currently    Comment: maybe once a month   Drug use: No   Sexual activity: Not on file  Other Topics Concern   Not on file  Social History Narrative   Not on file   Social Determinants of Health   Financial Resource Strain: Unknown (07/17/2019)   Overall Financial Resource Strain (CARDIA)    Difficulty of Paying Living Expenses: Patient declined  Food Insecurity: Unknown (07/17/2019)   Hunger Vital Sign    Worried About Running Out of Food in the Last Year: Patient declined    Ran Out of Food in the Last Year: Patient declined  Transportation Needs: Unknown (07/17/2019)   PRAPARE - Administrator, Civil Service (Medical): Patient declined    Lack of Transportation (Non-Medical): Patient declined  Physical Activity: Unknown (07/17/2019)   Exercise Vital Sign    Days of Exercise per Week: Patient declined    Minutes of Exercise per Session: Patient declined  Stress: Not on file  Social Connections: Unknown (07/17/2019)   Social Connection and Isolation Panel [NHANES]    Frequency of Communication with  Friends and Family: Patient declined    Frequency of Social Gatherings with Friends and Family: Patient declined    Attends Religious Services: Patient declined    Database administrator or Organizations: Patient declined    Attends Banker Meetings: Patient declined    Marital Status: Patient declined     Family History: The patient's family history includes Bladder Cancer in his maternal grandfather; CVA in his  father; Cervical cancer in his maternal grandmother; Cirrhosis in his maternal grandfather; Diabetes in his father; Heart disease (age of onset: 60) in his brother; Hypertension in his mother; Throat cancer in his father, maternal grandfather, and maternal grandmother. There is no history of Colon cancer.  ROS:   Please see the history of present illness.     All other systems reviewed and are negative.  EKGs/Labs/Other Studies Reviewed:    The following studies were reviewed today:   EKG:  EKG is ordered today.  EKG shows normal sinus rhythm, normal ECG.  Recent Labs: 07/17/2022: ALT 15 09/10/2022: B Natriuretic Peptide 12.7; BUN 16; Creatinine, Ser 1.09; Hemoglobin 12.9; Platelets 298; Potassium 4.4; Sodium 135  Recent Lipid Panel    Component Value Date/Time   CHOL  12/16/2008 0340    185        ATP III CLASSIFICATION:  <200     mg/dL   Desirable  161-096  mg/dL   Borderline High  >=045    mg/dL   High          TRIG 409 (H) 12/16/2008 0340   HDL 37 (L) 12/16/2008 0340   CHOLHDL 5.0 12/16/2008 0340   VLDL 41 (H) 12/16/2008 0340   LDLCALC (H) 12/16/2008 0340    107        Total Cholesterol/HDL:CHD Risk Coronary Heart Disease Risk Table                     Men   Women  1/2 Average Risk   3.4   3.3  Average Risk       5.0   4.4  2 X Average Risk   9.6   7.1  3 X Average Risk  23.4   11.0        Use the calculated Patient Ratio above and the CHD Risk Table to determine the patient's CHD Risk.        ATP III CLASSIFICATION (LDL):  <100     mg/dL    Optimal  811-914  mg/dL   Near or Above                    Optimal  130-159  mg/dL   Borderline  782-956  mg/dL   High  >213     mg/dL   Very High     Risk Assessment/Calculations:          Physical Exam:    VS:  BP 104/74 (BP Location: Left Arm, Patient Position: Sitting, Cuff Size: Large)   Pulse 79   Ht 6\' 2"  (1.88 m)   Wt (!) 319 lb 6.4 oz (144.9 kg)   SpO2 98% Comment: 2L O2  BMI 41.01 kg/m     Wt Readings from Last 3 Encounters:  12/01/22 (!) 319 lb 6.4 oz (144.9 kg)  10/16/22 (!) 312 lb (141.5 kg)  10/10/22 (!) 319 lb 12.8 oz (145.1 kg)     GEN:  Well nourished, well developed in no acute distress HEENT: Normal NECK: No JVD; No carotid bruits CARDIAC: RRR, no murmurs, rubs, gallops RESPIRATORY: Diminished breath sounds at bases, no wheezing ABDOMEN: Soft, non-tender, non-distended MUSCULOSKELETAL:  1+ edema; No deformity  SKIN: Warm and dry NEUROLOGIC:  Alert and oriented x 3 PSYCHIATRIC:  Normal affect   ASSESSMENT:    1. NICM (nonischemic cardiomyopathy)   2. Primary hypertension    PLAN:    In order of problems listed above:  Nonischemic cardiomyopathy, etiology  possibly hereditary (brother with HF at age 65).  Initial echo 20 to 25%, last echocardiogram 7/23  EF 45 to 50%.  Previous dizziness improved with reduction of Aldactone.  Endorses shortness of breath, 1+ edema noted on exam.  NYHA class III symptoms.  Increase torsemide to 60 mg twice daily, check BMP in 7 days.  Continue Aldactone 12.5 mg daily, Coreg 25 twice daily, Entresto 97-103 milligrams twice daily, Jardiance.  Hypertension, BP controlled.Continue Coreg, Entresto, Aldactone   Follow-up in 6 weeks.     Medication Adjustments/Labs and Tests Ordered: Current medicines are reviewed at length with the patient today.  Concerns regarding medicines are outlined above.  Orders Placed This Encounter  Procedures   Basic Metabolic Panel (BMET)   EKG 12-Lead   Meds ordered this encounter   Medications   torsemide (DEMADEX) 20 MG tablet    Sig: Take 3 tablets (60 mg total) by mouth 2 (two) times daily. Take extra tab if needed    Dispense:  540 tablet    Refill:  0    Patient Instructions  Medication Instructions:   INCREASE Torsemide - take 3 tablet (60 mg) by mouth twice a day.   *If you need a refill on your cardiac medications before your next appointment, please call your pharmacy*   Lab Work:  Your physician recommends that you return for lab work in: [facility] mall. No appt is needed. Hours are M-F 7AM- 6 PM.  If you have labs (blood work) drawn today and your tests are completely normal, you will receive your results only by: MyChart Message (if you have MyChart) OR A paper copy in the mail If you have any lab test that is abnormal or we need to change your treatment, we will call you to review the results.   Testing/Procedures:  None Ordered   Follow-Up: At [facility], you and your health needs are our priority.  As part of our continuing mission to provide you with exceptional heart care, we have created designated Provider Care Teams.  These Care Teams include your primary Cardiologist (physician) and Advanced Practice Providers (APPs -  Physician Assistants and Nurse Practitioners) who all work together to provide you with the care you need, when you need it.  We recommend signing up for the patient portal called "MyChart".  Sign up information is provided on this After Visit Summary.  MyChart is used to connect with patients for Virtual Visits (Telemedicine).  Patients are able to view lab/test results, encounter notes, upcoming appointments, etc.  Non-urgent messages can be sent to your provider as well.   To learn more about what you can do with MyChart, go to [email or web address].    Your next appointment:   6 week(s)  Provider:   You may see Debbe Odea, MD or one of the following Advanced Practice Providers on  your designated Care Team:   Nicolasa Ducking, NP Eula Listen, PA-C Cadence Fransico Michael, PA-C Charlsie Quest, NP    Signed, Debbe Odea, MD  12/01/2022 12:26 PM    [facility]

## 2022-12-01 NOTE — Patient Instructions (Signed)
Medication Instructions:   INCREASE Torsemide - take 3 tablet (60 mg) by mouth twice a day.   *If you need a refill on your cardiac medications before your next appointment, please call your pharmacy*   Lab Work:  Your physician recommends that you return for lab work in: [facility]. No appt is needed. Hours are M-F 7AM- 6 PM.  If you have labs (blood work) drawn today and your tests are completely normal, you will receive your results only by: MyChart Message (if you have MyChart) OR A paper copy in the mail If you have any lab test that is abnormal or we need to change your treatment, we will call you to review the results.   Testing/Procedures:  None Ordered   Follow-Up: At [facility], you and your health needs are our priority.  As part of our continuing mission to provide you with exceptional heart care, we have created designated Provider Care Teams.  These Care Teams include your primary Cardiologist (physician) and Advanced Practice Providers (APPs -  Physician Assistants and Nurse Practitioners) who all work together to provide you with the care you need, when you need it.  We recommend signing up for the patient portal called "MyChart".  Sign up information is provided on this After Visit Summary.  MyChart is used to connect with patients for Virtual Visits (Telemedicine).  Patients are able to view lab/test results, encounter notes, upcoming appointments, etc.  Non-urgent messages can be sent to your provider as well.   To learn more about what you can do with MyChart, go to [email or web address].    Your next appointment:   6 week(s)  Provider:   You may see Debbe Odea, MD or one of the following Advanced Practice Providers on your designated Care Team:   Nicolasa Ducking, NP Eula Listen, PA-C Cadence Fransico Michael, PA-C Charlsie Quest, NP

## 2022-12-07 ENCOUNTER — Other Ambulatory Visit
Admission: RE | Admit: 2022-12-07 | Discharge: 2022-12-07 | Disposition: A | Discharge status: Home / Self Care | Payer: PRIVATE HEALTH INSURANCE | Source: Ambulatory Visit | Attending: Cardiology | Admitting: Cardiology

## 2022-12-07 ENCOUNTER — Ambulatory Visit (HOSPITAL_BASED_OUTPATIENT_CLINIC_OR_DEPARTMENT_OTHER): Payer: PRIVATE HEALTH INSURANCE | Admitting: Internal Medicine

## 2022-12-07 ENCOUNTER — Encounter: Payer: Self-pay | Admitting: Internal Medicine

## 2022-12-07 VITALS — BP 114/80 | HR 92 | Resp 14 | Wt 322.5 lb

## 2022-12-07 DIAGNOSIS — I5032 Chronic diastolic (congestive) heart failure: Secondary | ICD-10-CM | POA: Diagnosis not present

## 2022-12-07 DIAGNOSIS — I10 Essential (primary) hypertension: Secondary | ICD-10-CM

## 2022-12-07 DIAGNOSIS — I428 Other cardiomyopathies: Secondary | ICD-10-CM | POA: Diagnosis not present

## 2022-12-07 LAB — BASIC METABOLIC PANEL
Anion gap: 10 (ref 5–15)
BUN: 33 mg/dL — ABNORMAL HIGH (ref 6–20)
CO2: 27 mmol/L (ref 22–32)
Calcium: 9.3 mg/dL (ref 8.9–10.3)
Chloride: 96 mmol/L — ABNORMAL LOW (ref 98–111)
Creatinine, Ser: 1.34 mg/dL — ABNORMAL HIGH (ref 0.61–1.24)
GFR, Estimated: >60 mL/min (ref >60)
Glucose, Bld: 251 mg/dL — ABNORMAL HIGH (ref 70–99)
Potassium: 4.3 mmol/L (ref 3.5–5.1)
Sodium: 133 mmol/L — ABNORMAL LOW (ref 135–145)

## 2022-12-07 MED ORDER — TORSEMIDE 20 MG PO TABS: ORAL_TABLET | ORAL | 0 refills | Status: DC

## 2022-12-07 NOTE — Patient Instructions (Addendum)
You have been scheduled for a cpx  (She see instruction sheet)  DECREASE Torsemide to  in the morning and 40 mg in the evening  Do the following things EVERYDAY: Weigh yourself in the morning before breakfast. Write it down and keep it in a log. Take your medicines as prescribed Eat low salt foods--Limit salt (sodium) to 2000 mg per day.  Stay as active as you can everyday Limit all fluids for the day to less than 2 liters

## 2022-12-07 NOTE — Progress Notes (Signed)
[facility] HF CLINIC NOTE  Referring Physician: Dr. Azucena Cecil Primary Care: Smith Robert, MD (Inactive) Primary Cardiologist: Dr. Azucena Cecil   HPI:  [name] Gregory is a 52 y.o. male with a history of HTN, T2DM, tobacco abuse, obesity, OSA (on CPAP) and HFrEF referred by Dr. Azucena Cecil for further evaluation of his GF.   Has multiple family members with HF on his father's side. Developed HF symptoms in Jan/Feb 2023. He had a prior TTE in 2020 that showed moderate concentric hypertrophy with normal EF. He was seen by Dr. Jaymes Graff at [facility] 11/03/21 nd underwent echo and stress test. 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 LVEDP was elevated at . Echo same day showed EF 20-25%, LV dilation, hypokinesis of the apical anterior, apical, apical inferior and apical septal segments, and grade III diastolic dysfunction. He also complained that his heart was fluttering, lasts for several seconds. Given this a Ziopatch was placed which was unrevealing. (A several brief runs SVT. 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 Referred to genetics for further discussion    He has been following at [facility] and also with Dr. Azucena Cecil and HF meds have been titrated. Recently had to cut back Entresto due to low BP.     Echo 8/23 @ [facility] EF 55%  cMRI: 11/23: EF 52% RV normal. No LGE.  I saw him in October for initial HF consultation. EF had recovered but still having significant HF symptoms. ReDS 38%. cMRI obtained and was normal (see above). Pending CPX study   Seen again last month acute visit due to progressive dyspnea and weight gain. ReDs normal at 36%. Referred for RHC on 06/28/22. RHC with low filling pressures and high CO.   RA =  3 RV = 35/7 PA = 36/19 (24) PCW = 11 Fick cardiac output/index = 9.0/3.3 PVR = 1.4 WU FA sat = 96% PA sat = 69%, 71% SVC sat = 78%  Returns today for f/u. Says he continues to feel SOB and having frequent periods of dizziness especially when standing Spiro decreased to 12.5 with some improvements. SBP ranges 120-140. Torsemide increased to 60 bid by Dr. Azucena Cecil last week with no worsening of dizziness. Swelling has improved. Using lymphedema pumps   Past Medical History:  Diagnosis Date   CHF (congestive heart failure)    Complication of anesthesia    WOKE UP DURING SURGERY   Diabetes    GERD (gastroesophageal reflux disease)    Hemangioma of liver    Hypertension    Sleep apnea    URI (upper respiratory infection)     Current Outpatient Medications  Medication Sig Dispense Refill   albuterol (VENTOLIN HFA) 108 (90 Base) MCG/ACT inhaler Inhale 2 puffs into the lungs every 6 (six) hours as needed for wheezing or shortness of breath. 8 g 2   atorvastatin (LIPITOR) 20 MG tablet Take 1 tablet (20 mg total) by mouth daily. 90 tablet 3   buPROPion (ZYBAN) 150 MG 12 hr tablet Take 150 mg by mouth 2 (two) times daily.     carvedilol (COREG) 25 MG tablet Take 1 tablet (25 mg total) by mouth 2 (two) times daily with a meal. 180 tablet 3   citalopram (CELEXA) 20 MG tablet Take 1 tablet by mouth daily.     dicyclomine (  BENTYL) 10 MG capsule Take 10 mg by mouth in the morning and at bedtime.     gabapentin (NEURONTIN) 300 MG capsule Take 300 mg by mouth 2 (two) times daily. Take 2 capsule  in AM and 1 capsule q PM for nerve pain     glipiZIDE (GLUCOTROL XL) 5 MG 24 hr tablet Take 5 mg by mouth 2 (two) times daily.     JARDIANCE 10 MG TABS tablet Take 1 tablet (10 mg total) by mouth daily. 90 tablet 3   LORazepam (ATIVAN) 0.5 MG tablet Take 0.5 mg by mouth every 8 (eight) hours as needed for anxiety.      magnesium oxide (MAG-OX) 400 (240 Mg) MG tablet Take 400 mg by mouth daily.      meloxicam (MOBIC) 15 MG tablet Take 1 tablet (15 mg total) by mouth daily. 30 tablet 1   metFORMIN (GLUCOPHAGE) 1000 MG tablet Take 1 tablet (1,000 mg total) by mouth 2 (two) times daily with a meal. 180 tablet 3   metroNIDAZOLE (FLAGYL) 500 MG tablet Take 500 mg by mouth 2 (two) times daily.     omeprazole (PRILOSEC) 20 MG capsule Take 20 mg by mouth 2 (two) times daily before a meal.     sacubitril-valsartan (ENTRESTO) 97-103 MG Take 1 tablet by mouth 2 (two) times daily. 60 tablet 6   spironolactone (ALDACTONE) 25 MG tablet Take 12.5 mg by mouth daily.     Vitamin D, Ergocalciferol, (DRISDOL) 50000 units CAPS capsule Take 1 capsule by mouth 3 (three) times a week.     Semaglutide, 2 MG/DOSE, 8 MG/3ML SOPN Inject 2 mg as directed once a week. (Patient not taking: Reported on 12/07/2022) 3 mL 3   torsemide (DEMADEX) 20 MG tablet Take  in the morning and  in the evening 540 tablet 0   No current facility-administered medications for this visit.    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[employer]  Tobacco Use   Smoking status: Former    Packs/day: 0.25    Years: 36.00    Additional pack years: 0.00    Total pack years: 9.00    Types: Cigarettes    Quit date: 11/18/2021    Years since quitting: 1.0   Smokeless tobacco: Never   Tobacco comments:    QUIT SMOKING IN APRIL  Vaping Use   Vaping Use: Every day  Substance and Sexual Activity   Alcohol use: Not Currently    Comment: maybe once a month   Drug use: No   Sexual activity: Not on file  Other Topics Concern   Not on file  Social History Narrative   Not on file   Social Determinants of Health   Financial Resource Strain: Unknown (07/17/2019)   Overall Financial Resource Strain  (CARDIA)    Difficulty of Paying Living Expenses: Patient declined  Food Insecurity: Unknown (07/17/2019)   Hunger Vital Sign    Worried About Running Out of Food in the Last Year: Patient declined    Ran Out of Food in the Last Year: Patient declined  Transportation Needs: Unknown (07/17/2019)   PRAPARE - Administrator, Civil Service (Medical): Patient declined  Lack of Transportation (Non-Medical): Patient declined  Physical Activity: Unknown (07/17/2019)   Exercise Vital Sign    Days of Exercise per Week: Patient declined    Minutes of Exercise per Session: Patient declined  Stress: Not on file  Social Connections: Unknown (07/17/2019)   Social Connection and Isolation Panel [NHANES]    Frequency of Communication with Friends and Family: Patient declined    Frequency of Social Gatherings with Friends and Family: Patient declined    Attends Religious Services: Patient declined    Active Member of Clubs or Organizations: Patient declined    Attends Banker Meetings: Patient declined    Marital Status: Patient declined  Intimate Partner Violence: Unknown (07/17/2019)   Humiliation, Afraid, Rape, and Kick questionnaire    Fear of Current or Ex-Partner: Patient declined    Emotionally Abused: Patient declined    Physically Abused: Patient declined    Sexually Abused: Patient declined      Family History  Problem Relation Age of Onset   Hypertension Mother    CVA Father    Diabetes Father    Throat cancer Father    Heart disease Brother 29   Cervical cancer Maternal Grandmother    Throat cancer Maternal Grandmother    Throat cancer Maternal Grandfather    Bladder Cancer Maternal Grandfather    Cirrhosis Maternal Grandfather    Colon cancer Neg Hx     Vitals:   12/07/22 1130 12/07/22 1213  BP: 112/80 114/80  Pulse: 82 92  Resp: 14   SpO2: 98%   Weight: (!) 322 lb 8 oz (146.3 kg)    Wt Readings from Last 3 Encounters:  12/07/22 (!) 322 lb 8 oz  (146.3 kg)  12/01/22 (!) 319 lb 6.4 oz (144.9 kg)  10/16/22 (!) 312 lb (141.5 kg)     PHYSICAL EXAM: General:  Obese male wearing O2 . No resp difficulty at rest General:  Well appearing. No resp difficulty HEENT: normal Neck: supple. no JVD. Carotids 2+ bilat; no bruits. No lymphadenopathy or thryomegaly appreciated. Cor: PMI nondisplaced. Regular rate & rhythm. No rubs, gallops or murmurs. Lungs: clear Abdomen: soft, nontender, nondistended. No hepatosplenomegaly. No bruits or masses. Good bowel sounds. Extremities: no cyanosis, clubbing, rash, edema Neuro: alert & orientedx3, cranial nerves grossly intact. moves all 4 extremities w/o difficulty. Affect pleasant   ASSESSMENT & PLAN:   1. 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 Referred to genetics for further discussion - SPEP/IFE showed no monoclonal protein - Echo 4/24 EF 20-25% - Cath 4/23 non-obstructive CAD - Echo 8/23 [facility]) with recovered LVEF 55%, LVIDd ~5.5, normal RV, RVSP ~29 - cMRI: 11/23: EF 52% RV normal. No LGE. - RHC 11/23  RA 3 PA 36/19 (24) PCW 11 Fick CO/CI 9.0/3.3 PA sat = 69%, 71% SVC sat = 78% - Despite recovered EF and normal filling pressures still with NYHA III-IIIb symptoms. - RHC with elevated CO but no evidence of shunt of previous ab CT with contrast or cMRI - Volume status looks ok on torsemide 60 bid with dizziness I suspect he may be a bit dry (ReDS 27%) but orthostatics ok today (done personally). Drop torsemide to 60/40 - Continue  carvedilol 25 bid  - Continue Jardiance 10  - Continue Entresto 97/103 bid b - Continue Spiro 12.5 daily - Has seen [facility] - continue  lymphedema pumps - Cardiac symptoms remain out of proportion to objective findings. Will get CPX to further evaluate - Dr. Azucena Cecil planning to repeat echo - I will see back in 6 months to check on him. Await results of CPX  2. Essential hypertension -Blood pressure well controlled. Continue current regimen.  3. DM2 - Followed by PCP - continue SGLT2i  4. Palpitations - Zio Apr 2023, no significant arrhythmias, rare ventricular ectopy  5. Morbid obesity - Body mass index is 41.41 kg/m. - continue ozempic  6. Chronic Hypoxic Respiratory Failure: - Per patient, was dropping O2 sat with exertion and qualified for home O2 - Unclear why he's requiring O2. Has been evaluted by Pulmonology (Dr. Vassie Loll) - No decline in sat with - Pending CPX. Suspect he may no longer need supplemental O2.   Arvilla Meres, MD  12:32 PM

## 2022-12-07 NOTE — Progress Notes (Signed)
   12/07/22 1213  ReDS Vest / Clip  Station Marker D  Ruler Value 42  ReDS Value Range < 36  ReDS Actual Value 27

## 2022-12-08 ENCOUNTER — Encounter: Payer: PRIVATE HEALTH INSURANCE | Admitting: Physical Medicine and Rehabilitation

## 2022-12-12 ENCOUNTER — Ambulatory Visit (INDEPENDENT_AMBULATORY_CARE_PROVIDER_SITE_OTHER): Payer: PRIVATE HEALTH INSURANCE | Admitting: Physical Medicine and Rehabilitation

## 2022-12-12 ENCOUNTER — Encounter: Payer: Self-pay | Admitting: Nurse Practitioner

## 2022-12-12 ENCOUNTER — Encounter: Payer: Self-pay | Admitting: Cardiology

## 2022-12-12 DIAGNOSIS — M25511 Pain in right shoulder: Secondary | ICD-10-CM | POA: Diagnosis not present

## 2022-12-12 DIAGNOSIS — R202 Paresthesia of skin: Secondary | ICD-10-CM | POA: Diagnosis not present

## 2022-12-12 DIAGNOSIS — M12811 Other specific arthropathies, not elsewhere classified, right shoulder: Secondary | ICD-10-CM | POA: Diagnosis not present

## 2022-12-12 DIAGNOSIS — M79601 Pain in right arm: Secondary | ICD-10-CM | POA: Diagnosis not present

## 2022-12-12 DIAGNOSIS — G8929 Other chronic pain: Secondary | ICD-10-CM

## 2022-12-12 NOTE — Progress Notes (Signed)
Functional Pain Scale - descriptive words and definitions  Unmanageable (7)  Pain interferes with normal ADL's/nothing seems to help/sleep is very difficult/active distractions are very difficult to concentrate on. Severe range order  Average Pain 6  Right handed. Pain from right shoulder to wrist

## 2022-12-13 NOTE — Procedures (Signed)
EMG & NCV Findings: Evaluation of the right median motor nerve showed prolonged distal onset latency (4.9 ms) and decreased conduction velocity (Elbow-Wrist, 36 m/s).  The right ulnar motor nerve showed prolonged distal onset latency (5.2 ms), reduced amplitude (0.8 mV), decreased conduction velocity (B Elbow-Wrist, 39 m/s), and decreased conduction velocity (A Elbow-B Elbow, 44 m/s).  The right median (across palm) sensory nerve showed prolonged distal peak latency (Wrist, 4.8 ms) and prolonged distal peak latency (Palm, 7.2 ms).  The right radial sensory nerve showed reduced amplitude (3.9 V).  The right ulnar sensory nerve showed prolonged distal peak latency (4.8 ms), reduced amplitude (2.5 V), and decreased conduction velocity (Wrist-5th Digit, 29 m/s).    Needle evaluation of the right first dorsal interosseous muscle showed increased insertional activity and diminished recruitment.  The right abductor pollicis brevis muscle showed diminished recruitment.  All remaining muscles (as indicated in the following table) showed no evidence of electrical instability.    Impression: The above electrodiagnostic study is ABNORMAL and reveals evidence of a moderate to  severe right median nerve entrapment at the wrist (carpal tunnel syndrome) affecting sensory and motor components.   **There is also evidence suggestive of an underlying peripheral polyneuropathy mostly consistent with diabetic neuropathy.  The ulnar slowing is likely indicated of of the polyneuropathy more than ulnar neuropathy across the elbow as there is nonspecific drop across the elbow.  There is no significant electrodiagnostic evidence of any other focal nerve entrapment, brachial plexopathy or cervical radiculopathy.  As you know, this particular electrodiagnostic study cannot rule out chemical radiculitis or sensory only radiculopathy.  Recommendations: 1.  Follow-up with referring physician.  Clinical correlation with symptoms is  paramount.  He likely does have an underlying peripheral polyneuropathy.  Consider workup with neurology. 2.  Continue current management of symptoms.  ___________________________ Naaman Plummer FAAPMR Board Certified, American Board of Physical Medicine and Rehabilitation    Nerve Conduction Studies Anti Sensory Summary Table   Stim Site NR Peak (ms) Norm Peak (ms) P-T Amp (V) Norm P-T Amp Site1 Site2 Delta-P (ms) Dist (cm) Vel (m/s) Norm Vel (m/s)  Right Median Acr Palm Anti Sensory (2nd Digit)  30.9C  Wrist    *4.8 <3.6 11.9 >10 Wrist Palm 2.4 0.0    Palm    *7.2 <2.0 9.6         Right Radial Anti Sensory (Base 1st Digit)  31.3C  Wrist    2.6 <3.1 *3.9 >5 Wrist Base 1st Digit 2.6 0.0    Site 2    2.8  5.1         Right Ulnar Anti Sensory (5th Digit)  30.9C  Wrist    *4.8 <3.7 *2.5 >15.0 Wrist 5th Digit 4.8 14.0 *29 >38   Motor Summary Table   Stim Site NR Onset (ms) Norm Onset (ms) O-P Amp (mV) Norm O-P Amp Site1 Site2 Delta-0 (ms) Dist (cm) Vel (m/s) Norm Vel (m/s)  Right Median Motor (Abd Poll Brev)  31.9C  Wrist    *4.9 <4.2 7.9 >5 Elbow Wrist 6.9 24.5 *36 >50  Elbow    11.8  6.1         Right Ulnar Motor (Abd Dig Min)  31.8C  Wrist    *5.2 <4.2 *0.8 >3 B Elbow Wrist 6.3 24.5 *39 >53  B Elbow    11.5  0.8  A Elbow B Elbow 2.7 12.0 *44 >53  A Elbow    14.2  0.7  EMG   Side Muscle Nerve Root Ins Act Fibs Psw Amp Dur Poly Recrt Int Dennie Bible Comment  Right 1stDorInt Ulnar C8-T1 *Incr Nml Nml Nml Nml 0 *Reduced Nml   Right Abd Poll Brev Median C8-T1 Nml Nml Nml Nml Nml 0 *Reduced Nml   Right ExtDigCom   Nml Nml Nml Nml Nml 0 Nml Nml   Right Triceps Radial C6-7-8 Nml Nml Nml Nml Nml 0 Nml Nml   Right Deltoid Axillary C5-6 Nml Nml Nml Nml Nml 0 Nml Nml     Nerve Conduction Studies Anti Sensory Left/Right Comparison   Stim Site L Lat (ms) R Lat (ms) L-R Lat (ms) L Amp (V) R Amp (V) L-R Amp (%) Site1 Site2 L Vel (m/s) R Vel (m/s) L-R Vel (m/s)  Median Acr Palm Anti  Sensory (2nd Digit)  30.9C  Wrist  *4.8   11.9  Wrist Palm     Palm  *7.2   9.6        Radial Anti Sensory (Base 1st Digit)  31.3C  Wrist  2.6   *3.9  Wrist Base 1st Digit     Site 2  2.8   5.1        Ulnar Anti Sensory (5th Digit)  30.9C  Wrist  *4.8   *2.5  Wrist 5th Digit  *29    Motor Left/Right Comparison   Stim Site L Lat (ms) R Lat (ms) L-R Lat (ms) L Amp (mV) R Amp (mV) L-R Amp (%) Site1 Site2 L Vel (m/s) R Vel (m/s) L-R Vel (m/s)  Median Motor (Abd Poll Brev)  31.9C  Wrist  *4.9   7.9  Elbow Wrist  *36   Elbow  11.8   6.1        Ulnar Motor (Abd Dig Min)  31.8C  Wrist  *5.2   *0.8  B Elbow Wrist  *39   B Elbow  11.5   0.8  A Elbow B Elbow  *44   A Elbow  14.2   0.7           Waveforms:

## 2022-12-14 ENCOUNTER — Telehealth: Payer: Self-pay | Admitting: Cardiology

## 2022-12-14 ENCOUNTER — Other Ambulatory Visit: Payer: Self-pay

## 2022-12-14 ENCOUNTER — Encounter: Payer: Self-pay | Admitting: Cardiology

## 2022-12-14 DIAGNOSIS — I509 Heart failure, unspecified: Secondary | ICD-10-CM

## 2022-12-14 NOTE — Telephone Encounter (Signed)
Unable to contact patient to schedule echocardiogram, contact numbers unable to leave voicemail and one number out of service. Will send letter.

## 2022-12-19 ENCOUNTER — Ambulatory Visit: Payer: PRIVATE HEALTH INSURANCE | Admitting: Sports Medicine

## 2022-12-19 ENCOUNTER — Encounter: Payer: Self-pay | Admitting: Sports Medicine

## 2022-12-19 DIAGNOSIS — G8929 Other chronic pain: Secondary | ICD-10-CM

## 2022-12-19 DIAGNOSIS — M12811 Other specific arthropathies, not elsewhere classified, right shoulder: Secondary | ICD-10-CM

## 2022-12-19 DIAGNOSIS — M25511 Pain in right shoulder: Secondary | ICD-10-CM | POA: Diagnosis not present

## 2022-12-19 DIAGNOSIS — M25572 Pain in left ankle and joints of left foot: Secondary | ICD-10-CM | POA: Diagnosis not present

## 2022-12-19 DIAGNOSIS — E1142 Type 2 diabetes mellitus with diabetic polyneuropathy: Secondary | ICD-10-CM

## 2022-12-19 DIAGNOSIS — M5136 Other intervertebral disc degeneration, lumbar region: Secondary | ICD-10-CM | POA: Diagnosis not present

## 2022-12-19 NOTE — Progress Notes (Signed)
[name] Gregory - 52 y.o. male MRN [id number]  Date of birth: 07-Nov-1970  Office Visit Note: Visit Date: 12/19/2022 PCP: Smith Robert, MD (Inactive) Referred by: No ref. provider found  Subjective: No chief complaint on file.  HPI: [name] Gregory is a pleasant 52 y.o. male who presents today for chronic right shoulder pain, neuropathy, left foot pain/weakness.  Right shoulder -continues with chronic shoulder pain.  He notes the pain is when he moves the shoulder, specifically with abduction.  He will feel a sharp pain as well as a nerve related pain when he does this.  He has had a subacromial joint injection on 10/10/2022 as well as a glenohumeral joint injection on 11/14/2022, states that the glenohumeral joint injection did help relieve his pain moderately but still never got rid of completely.  He does have pain that radiates from the shoulder down the arm but this is more so on the radial side, denies any specific consistent numbness tingling in the fingers.  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  However, according to [name] Gregory this is not very bothersome for him.  His worst pain is specifically in the right shoulder.  NCS/EMG from Dr. Lonzo Cloud as well was reviewed with the patient's which shows superimposed diabetic peripheral neuropathy as well as moderate right carpal tunnel.  This was 114/30/24.  Lab Results  Component Value Date   HGBA1C 6.8 (A) 10/10/2022   Neuropathy - recently saw [facility]. Had bilateral upper and lower extremity NCS/EMG, which showed:   [name] Gregory is also having numbness continued in the feet, he states the left foot specifically does not feel as well.  He is having some episodes of stumbling because the foot would not work quite as well.  Asking about a brace.  Pertinent ROS were reviewed with the patient and found to be  negative unless otherwise specified above in HPI.   Assessment & Plan: Visit Diagnoses:  1. Chronic right shoulder pain   2. Rotator cuff arthropathy of right shoulder   3. DDD (degenerative disc disease), lumbar   4. Pain in left ankle and joints of left foot   5. Type 2 diabetes mellitus with diabetic polyneuropathy, unspecified whether long term insulin use (HCC)    Plan: Discussed with [name] Gregory possible etiology for his right shoulder pain, I do think that this is coming from within the shoulder joint and/or the rotator cuff tendons.  He did get moderately from the glenohumeral joint injection, I would expect him to get further relief from the subacromial joint injection however.  He has had nerve conduction and EMG studies which did not show any evidence of cervical radiculopathy.  He does have cubital tunnel and carpal tunnel, but this is not as bothersome for him today.  I do think we need to better evaluate the shoulder with an MRI to look at the cartilage as well as his rotator cuff quality.  MRI of the right shoulder ordered today.  [name] Gregory certainly has diabetic peripheral neuropathy which is contributing to both his hands and feet, however he is also reporting some episodes of transient foot drop versus lack of sensation for the left foot.  He is asking about bracing.  I did write him a prescription for an ankle foot orthosis today that he may pick up from the local medical supply store.  Did discuss and  following up with neurology regarding this chronic issue and other treatment options outside of gabapentin for his neuropathy (I.e. Qutenza and other options).  If he is not improving with this could always consider referral for physical therapy for HEP for the ankle and foot.  He has recently increased his gabapentin to 300 mg from the morning and lunch, 600 mg nightly, he will continue this.  Meloxicam 15 mg daily.  We will follow-up after shoulder, to discuss next steps.  I do want him to get  started in formalized physical therapy, he had a referral sent to [facility] in [place], he will call to setup evaluation.  Follow-up: Return for Will call patient to discuss next steps after shoulder MRI.   Meds & Orders: No orders of the defined types were placed in this encounter.   Orders Placed This Encounter  Procedures   MR SHOULDER RIGHT WO CONTRAST    Procedures: No procedures performed      Clinical History: No specialty comments available.  He reports that he quit smoking about 13 months ago. His smoking use included cigarettes. He has a 9.00 pack-year smoking history. He has never used smokeless tobacco.  Recent Labs    04/06/22 0951 07/10/22 1453 10/10/22 1115  HGBA1C 6.0* 5.9* 6.8*    Objective:    Physical Exam  Gen: Well-appearing, in no acute distress; non-toxic CV:  Well-perfused. Warm.  Resp: Breathing unlabored on chronic portable oxygen no wheezing. Psych: Fluid speech in conversation; appropriate affect; normal thought process Neuro: Sensation intact throughout. No gross coordination deficits.   Ortho Exam - Right shoulder: + TTP at Codman's point as well as the anterior shoulder joint overlying the bicipital groove.  There is pain and impingement with Hawkins testing.  Pain with resisted abduction and empty can testing.  Positive speeds test.  There is guarding with provocative rotator cuff testing.  - Left ankle: There is slightly limited dorsiflexion of the left foot.  There is weakness with dorsiflexion compared to the contralateral foot, plantarflexion intact equivocally.  Imaging: - XR R-shoulder 10/10/22: 4 views of the right shoulder including AP, Grashey, scapular Y and  axillary views were ordered and reviewed by myself.  There is mild  glenohumeral joint arthritic change.  Moderate AC joint arthritis.  No  acute fracture or evidence of bony abnormality noted.   Past Medical/Family/Surgical/Social History: Medications & Allergies reviewed per  EMR, new medications updated. Patient Active Problem List   Diagnosis Date Noted   Chronic respiratory failure with hypoxia (HCC) 01/02/2022   HTN (hypertension) 06/10/2020   Loose stools 06/09/2020   GERD (gastroesophageal reflux disease) 06/09/2020   Elevated LFTs 06/09/2020   Early satiety 06/09/2020   Fatty liver 06/09/2020   Dizziness    Hyperglycemia    Liver enzyme elevation    Nausea vomiting and diarrhea    Transaminitis    AKI (acute kidney injury) (HCC) 07/17/2019   Abdominal pain 07/17/2019   Nasal congestion 07/17/2019   Post-nasal drainage 07/17/2019   OSA on CPAP 06/14/2017   Morbid (severe) obesity due to excess calories (HCC) 06/14/2017   Abdominal pain, epigastric 04/24/2016   RUQ pain 04/24/2016   Microcytic anemia 04/24/2016   Diarrhea 04/24/2016   Elevated sed rate 04/24/2016   Splenomegaly 04/24/2016   Past Medical History:  Diagnosis Date   CHF (congestive heart failure) (HCC)    Complication of anesthesia    WOKE UP DURING SURGERY   Diabetes (HCC)    GERD (gastroesophageal reflux disease)  Hemangioma of liver    Hypertension    Sleep apnea    URI (upper respiratory infection)    Family History  Problem Relation Age of Onset   Hypertension Mother    CVA Father    Diabetes Father    Throat cancer Father    Heart disease Brother 55   Cervical cancer Maternal Grandmother    Throat cancer Maternal Grandmother    Throat cancer Maternal Grandfather    Bladder Cancer Maternal Grandfather    Cirrhosis Maternal Grandfather    Colon cancer Neg Hx    Past Surgical History:  Procedure Laterality Date   BIOPSY  05/04/2016   Procedure: BIOPSY;  Surgeon: Corbin Ade, MD;  Location: [facility];  Service: Endoscopy;;  gastric bx   CARDIAC CATHETERIZATION Bilateral 10/2021   COLONOSCOPY WITH PROPOFOL N/A 05/04/2016    PROPOFOL;  Surgeon: Corbin Ade, MD; diverticulosis throughout the entire colon, otherwise normal.  Repeat due in 2027.    ESOPHAGOGASTRODUODENOSCOPY (EGD) WITH PROPOFOL N/A 05/04/2016   PROPOFOL;  Surgeon: Corbin Ade, MD; LA grade B esophagitis, gastritis, small hiatal hernia.   EYE SURGERY     RIGHT HEART CATH Right 06/28/2022   Procedure: RIGHT HEART CATH;  Surgeon: Dolores Patty, MD;  Location: [facility] CV [facility];  Service: Cardiovascular;  Laterality: Right;   SALIVARY GLAND SURGERY  1992   Social History   Occupational History   Occupation: administrative for [employer]  Tobacco Use   Smoking status: Former    Packs/day: 0.25    Years: 36.00    Additional pack years: 0.00    Total pack years: 9.00    Types: Cigarettes    Quit date: 11/18/2021    Years since quitting: 1.0   Smokeless tobacco: Never   Tobacco comments:    QUIT SMOKING IN APRIL  Vaping Use   Vaping Use: Every day  Substance and Sexual Activity   Alcohol use: Not Currently    Comment: maybe once a month   Drug use: No   Sexual activity: Not on file

## 2022-12-21 NOTE — Progress Notes (Signed)
[name] Gregory - 52 y.o. male MRN [id number]  Date of birth: [birth year]  Office Visit Note: Visit Date: 12/12/2022 PCP: Smith Robert, MD Referred by: [name] Brunner, DO  Subjective: Chief Complaint  Patient presents with   Right Arm - Pain   HPI:  [name] Gregory is a 52 y.o. male who comes in today at the request of Dr. Madelyn Gregory for evaluation and management of chronic, worsening and severe pain, numbness and tingling in the Right upper extremities.  Patient is Right hand dominant.  His biggest complaint is pain from the right shoulder down to the hand.  Somewhat nondermatomal somewhat more radial digits.  No history of prior cervical surgery.  He has had injection and other diagnostic treatment of the right shoulder without relief.  He is diabetic with a significant amount of other medical issues.  Case is complicated by morbid obesity.  Last hemoglobin A1c was in the high 6.7 range but has in the past been as high as in the upper 9 range.  Has failed medication management at this point.  No recent electrodiagnostic studies to review.   I spent more than 30 minutes speaking face-to-face with the patient with 50% of the time in counseling and discussing coordination of care.    Review of Systems  Musculoskeletal:  Positive for joint pain.  Neurological:  Positive for tingling and weakness.  All other systems reviewed and are negative.  Otherwise per HPI.  Assessment & Plan: Visit Diagnoses:    ICD-10-CM   1. Paresthesia of skin  R20.2 NCV with EMG (electromyography)    2. Chronic right shoulder pain  M25.511    G89.29     3. Rotator cuff arthropathy of right shoulder  M12.811     4. Right arm pain  M79.601       Plan: Impression: Complicated clinical picture of shoulder pain resistant to treatment with medications and injections but also ROM referral down the arm with a history of diabetes.  The above electrodiagnostic study is ABNORMAL and reveals evidence of a moderate to   severe right median nerve entrapment at the wrist (carpal tunnel syndrome) affecting sensory and motor components.   **There is also evidence suggestive of an underlying peripheral polyneuropathy mostly consistent with diabetic neuropathy.  The ulnar slowing is likely indicated of of the polyneuropathy more than ulnar neuropathy across the elbow as there is nonspecific drop across the elbow.  There is no significant electrodiagnostic evidence of any other focal nerve entrapment, brachial plexopathy or cervical radiculopathy.  As you know, this particular electrodiagnostic study cannot rule out chemical radiculitis or sensory only radiculopathy.  Recommendations: 1.  Follow-up with referring physician.  Clinical correlation with symptoms is paramount.  He likely does have an underlying peripheral polyneuropathy.  Consider workup with neurology. 2.  Continue current management of symptoms.  Meds & Orders: No orders of the defined types were placed in this encounter.   Orders Placed This Encounter  Procedures   NCV with EMG (electromyography)    Follow-up: Return for [name] Brunner, DO.   Procedures: No procedures performed  EMG & NCV Findings: Evaluation of the right median motor nerve showed prolonged distal onset latency (4.9 ms) and decreased conduction velocity (Elbow-Wrist, 36 m/s).  The right ulnar motor nerve showed prolonged distal onset latency (5.2 ms), reduced amplitude (0.8 mV), decreased conduction velocity (B Elbow-Wrist, 39 m/s), and decreased conduction velocity (A Elbow-B Elbow, 44 m/s).  The right median (across palm) sensory nerve  showed prolonged distal peak latency (Wrist, 4.8 ms) and prolonged distal peak latency (Palm, 7.2 ms).  The right radial sensory nerve showed reduced amplitude (3.9 V).  The right ulnar sensory nerve showed prolonged distal peak latency (4.8 ms), reduced amplitude (2.5 V), and decreased conduction velocity (Wrist-5th Digit, 29 m/s).    Needle  evaluation of the right first dorsal interosseous muscle showed increased insertional activity and diminished recruitment.  The right abductor pollicis brevis muscle showed diminished recruitment.  All remaining muscles (as indicated in the following table) showed no evidence of electrical instability.    Impression: The above electrodiagnostic study is ABNORMAL and reveals evidence of a moderate to  severe right median nerve entrapment at the wrist (carpal tunnel syndrome) affecting sensory and motor components.   **There is also evidence suggestive of an underlying peripheral polyneuropathy mostly consistent with diabetic neuropathy.  The ulnar slowing is likely indicated of of the polyneuropathy more than ulnar neuropathy across the elbow as there is nonspecific drop across the elbow.  There is no significant electrodiagnostic evidence of any other focal nerve entrapment, brachial plexopathy or cervical radiculopathy.  As you know, this particular electrodiagnostic study cannot rule out chemical radiculitis or sensory only radiculopathy.  Recommendations: 1.  Follow-up with referring physician.  Clinical correlation with symptoms is paramount.  He likely does have an underlying peripheral polyneuropathy.  Consider workup with neurology. 2.  Continue current management of symptoms.  ___________________________ Naaman Plummer FAAPMR Board Certified, American Board of Physical Medicine and Rehabilitation    Nerve Conduction Studies Anti Sensory Summary Table   Stim Site NR Peak (ms) Norm Peak (ms) P-T Amp (V) Norm P-T Amp Site1 Site2 Delta-P (ms) Dist (cm) Vel (m/s) Norm Vel (m/s)  Right Median Acr Palm Anti Sensory (2nd Digit)  30.9C  Wrist    *4.8 <3.6 11.9 >10 Wrist Palm 2.4 0.0    Palm    *7.2 <2.0 9.6         Right Radial Anti Sensory (Base 1st Digit)  31.3C  Wrist    2.6 <3.1 *3.9 >5 Wrist Base 1st Digit 2.6 0.0    Site 2    2.8  5.1         Right Ulnar Anti Sensory (5th Digit)   30.9C  Wrist    *4.8 <3.7 *2.5 >15.0 Wrist 5th Digit 4.8 14.0 *29 >38   Motor Summary Table   Stim Site NR Onset (ms) Norm Onset (ms) O-P Amp (mV) Norm O-P Amp Site1 Site2 Delta-0 (ms) Dist (cm) Vel (m/s) Norm Vel (m/s)  Right Median Motor (Abd Poll Brev)  31.9C  Wrist    *4.9 <4.2 7.9 >5 Elbow Wrist 6.9 24.5 *36 >50  Elbow    11.8  6.1         Right Ulnar Motor (Abd Dig Min)  31.8C  Wrist    *5.2 <4.2 *0.8 >3 B Elbow Wrist 6.3 24.5 *39 >53  B Elbow    11.5  0.8  A Elbow B Elbow 2.7 12.0 *44 >53  A Elbow    14.2  0.7          EMG   Side Muscle Nerve Root Ins Act Fibs Psw Amp Dur Poly Recrt Int Dennie Bible Comment  Right 1stDorInt Ulnar C8-T1 *Incr Nml Nml Nml Nml 0 *Reduced Nml   Right Abd Poll Brev Median C8-T1 Nml Nml Nml Nml Nml 0 *Reduced Nml   Right ExtDigCom   Nml Nml Nml Nml Nml 0 Nml Nml  Right Triceps Radial C6-7-8 Nml Nml Nml Nml Nml 0 Nml Nml   Right Deltoid Axillary C5-6 Nml Nml Nml Nml Nml 0 Nml Nml     Nerve Conduction Studies Anti Sensory Left/Right Comparison   Stim Site L Lat (ms) R Lat (ms) L-R Lat (ms) L Amp (V) R Amp (V) L-R Amp (%) Site1 Site2 L Vel (m/s) R Vel (m/s) L-R Vel (m/s)  Median Acr Palm Anti Sensory (2nd Digit)  30.9C  Wrist  *4.8   11.9  Wrist Palm     Palm  *7.2   9.6        Radial Anti Sensory (Base 1st Digit)  31.3C  Wrist  2.6   *3.9  Wrist Base 1st Digit     Site 2  2.8   5.1        Ulnar Anti Sensory (5th Digit)  30.9C  Wrist  *4.8   *2.5  Wrist 5th Digit  *29    Motor Left/Right Comparison   Stim Site L Lat (ms) R Lat (ms) L-R Lat (ms) L Amp (mV) R Amp (mV) L-R Amp (%) Site1 Site2 L Vel (m/s) R Vel (m/s) L-R Vel (m/s)  Median Motor (Abd Poll Brev)  31.9C  Wrist  *4.9   7.9  Elbow Wrist  *36   Elbow  11.8   6.1        Ulnar Motor (Abd Dig Min)  31.8C  Wrist  *5.2   *0.8  B Elbow Wrist  *39   B Elbow  11.5   0.8  A Elbow B Elbow  *44   A Elbow  14.2   0.7           Waveforms:             Clinical History: No specialty  comments available.     Objective:  VS:  HT:    WT:   BMI:     BP:   HR: bpm  TEMP: ( )  RESP:  Physical Exam Vitals and nursing note reviewed.  Constitutional:      General: He is not in acute distress.    Appearance: Normal appearance. He is well-developed.  HENT:     Head: Normocephalic and atraumatic.  Eyes:     Conjunctiva/sclera: Conjunctivae normal.     Pupils: Pupils are equal, round, and reactive to light.  Cardiovascular:     Rate and Rhythm: Normal rate.     Pulses: Normal pulses.     Heart sounds: Normal heart sounds.  Pulmonary:     Effort: Pulmonary effort is normal. No respiratory distress.  Musculoskeletal:        General: Tenderness present.     Cervical back: Normal range of motion and neck supple. No rigidity.     Right lower leg: No edema.     Left lower leg: No edema.     Comments: Inspection reveals mild atrophy of the bilateral APB or FDI or but not hand intrinsics.  He does have some painful shoulder range of motion on the right.  There is no swelling, color changes, allodynia or dystrophic changes. There is 5 out of 5 strength in the bilateral wrist extension, finger abduction and long finger flexion. There is intact sensation to light touch in all dermatomal and peripheral nerve distributions. There is a negative Tinel's test at the bilateral wrist and elbow. There is a negative Phalen's test bilaterally. There is a negative Hoffmann's test bilaterally.  Skin:  General: Skin is warm and dry.     Findings: No erythema or rash.  Neurological:     General: No focal deficit present.     Mental Status: He is alert and oriented to person, place, and time.     Sensory: No sensory deficit.     Motor: No weakness or abnormal muscle tone.     Coordination: Coordination normal.     Gait: Gait normal.  Psychiatric:        Mood and Affect: Mood normal.        Behavior: Behavior normal.        Thought Content: Thought content normal.      Imaging: No  results found.

## 2022-12-27 ENCOUNTER — Other Ambulatory Visit (HOSPITAL_COMMUNITY): Payer: Self-pay | Admitting: *Deleted

## 2022-12-27 ENCOUNTER — Ambulatory Visit (HOSPITAL_COMMUNITY): Payer: PRIVATE HEALTH INSURANCE | Attending: Cardiology

## 2022-12-27 DIAGNOSIS — I5032 Chronic diastolic (congestive) heart failure: Secondary | ICD-10-CM

## 2023-01-01 ENCOUNTER — Other Ambulatory Visit
Admission: RE | Admit: 2023-01-01 | Discharge: 2023-01-01 | Disposition: A | Discharge status: Home / Self Care | Payer: PRIVATE HEALTH INSURANCE | Attending: Cardiology | Admitting: Cardiology

## 2023-01-01 ENCOUNTER — Other Ambulatory Visit: Payer: Self-pay | Admitting: *Deleted

## 2023-01-01 DIAGNOSIS — I5032 Chronic diastolic (congestive) heart failure: Secondary | ICD-10-CM | POA: Insufficient documentation

## 2023-01-01 LAB — BASIC METABOLIC PANEL
Anion gap: 10 (ref 5–15)
BUN: 21 mg/dL — ABNORMAL HIGH (ref 6–20)
CO2: 27 mmol/L (ref 22–32)
Calcium: 9.3 mg/dL (ref 8.9–10.3)
Chloride: 96 mmol/L — ABNORMAL LOW (ref 98–111)
Creatinine, Ser: 1.25 mg/dL — ABNORMAL HIGH (ref 0.61–1.24)
GFR, Estimated: >60 mL/min (ref >60)
Glucose, Bld: 193 mg/dL — ABNORMAL HIGH (ref 70–99)
Potassium: 4.3 mmol/L (ref 3.5–5.1)
Sodium: 133 mmol/L — ABNORMAL LOW (ref 135–145)

## 2023-01-04 DIAGNOSIS — H903 Sensorineural hearing loss, bilateral: Secondary | ICD-10-CM | POA: Insufficient documentation

## 2023-01-08 ENCOUNTER — Encounter: Payer: Self-pay | Admitting: Sports Medicine

## 2023-01-12 ENCOUNTER — Encounter: Payer: Self-pay | Admitting: Cardiology

## 2023-01-12 ENCOUNTER — Ambulatory Visit: Payer: PRIVATE HEALTH INSURANCE | Attending: Cardiology | Admitting: Cardiology

## 2023-01-12 VITALS — BP 106/82 | HR 99 | Ht 74.0 in | Wt 323.8 lb

## 2023-01-12 DIAGNOSIS — I10 Essential (primary) hypertension: Secondary | ICD-10-CM

## 2023-01-12 DIAGNOSIS — I428 Other cardiomyopathies: Secondary | ICD-10-CM | POA: Diagnosis not present

## 2023-01-12 DIAGNOSIS — Z79899 Other long term (current) drug therapy: Secondary | ICD-10-CM | POA: Diagnosis not present

## 2023-01-12 MED ORDER — TORSEMIDE 60 MG PO TABS: 60.0000 mg | ORAL_TABLET | Freq: Two times a day (BID) | ORAL | 3 refills | Status: DC

## 2023-01-12 MED ORDER — CARVEDILOL 12.5 MG PO TABS: 12.5000 mg | ORAL_TABLET | Freq: Two times a day (BID) | ORAL | 3 refills | Status: DC

## 2023-01-12 NOTE — Progress Notes (Signed)
Cardiology Office Note:    Date:  01/12/2023   ID:  [name] Gregory, DOB 02/02/1971, MRN [id number]  PCP:  Smith Robert, MD   [facility] Providers Cardiologist:  Debbe Odea, MD     Referring MD: No ref. provider found   Chief Complaint  Patient presents with   Follow-up    Patient is concerned with recent wt gain and SOBr.  Would like to discuss possible change in Torsemide dosing.  Blood pressure home readings low at 92/64, bp in office today 106/82.  Does have an increase in fatigue when he gets low bp readings.    History of Present Illness:    [name] Gregory is a 52 y.o. male with a hx of NICM (initial EF 25%, last EF 50%), hypertension, diabetes, OSA/CPAP, former smoker who presents to for follow-up.   Last seen with symptoms of leg edema, abdominal distention.  Torsemide increased to 60 mg twice daily, felt better, followed up with heart failure team, noted to be dry on exam.  Torsemide reduced to 60/40.  States gaining some weight, abdomen has become distended over the past 1 to 2 weeks.  Also notes low blood pressures at home with systolics sometimes in the 90s associated with dizziness.  He has fallen in the past due to dizziness and low blood pressures.  Compliant with medications as prescribed.   Prior notes CMR 11/23 EF 52% Echo 7/23 EF 45 to 50% Outside records/reports from [facility] Echo 11/18/2020 EF 20 to 25% Left heart cath 11/18/2021 no evidence for CAD Brother has history of CHF, died at age 80  Past Medical History:  Diagnosis Date   Carpal tunnel syndrome    CHF (congestive heart failure) (HCC)    Complication of anesthesia    WOKE UP DURING SURGERY   Diabetes (HCC)    Fatty liver    GERD (gastroesophageal reflux disease)    Hemangioma of liver    Hypertension    Rotator cuff disorder    Sleep apnea    URI (upper respiratory infection)     Past Surgical History:  Procedure Laterality Date   BIOPSY  05/04/2016   Procedure: BIOPSY;   Surgeon: Corbin Ade, MD;  Location: [facility];  Service: Endoscopy;;  gastric bx   CARDIAC CATHETERIZATION Bilateral 10/2021   COLONOSCOPY WITH PROPOFOL N/A 05/04/2016    PROPOFOL;  Surgeon: Corbin Ade, MD; diverticulosis throughout the entire colon, otherwise normal.  Repeat due in 2027.   ESOPHAGOGASTRODUODENOSCOPY (EGD) WITH PROPOFOL N/A 05/04/2016   PROPOFOL;  Surgeon: Corbin Ade, MD; LA grade B esophagitis, gastritis, small hiatal hernia.   EYE SURGERY     RIGHT HEART CATH Right 06/28/2022   Procedure: RIGHT HEART CATH;  Surgeon: Dolores Patty, MD;  Location: [facility] INVASIVE CV LAB;  Service: Cardiovascular;  Laterality: Right;   SALIVARY GLAND SURGERY  1992    Current Medications: Current Meds  Medication Sig   albuterol (VENTOLIN HFA) 108 (90 Base) MCG/ACT inhaler Inhale 2 puffs into the lungs every 6 (six) hours as needed for wheezing or shortness of breath.   ARIPiprazole (ABILIFY) 2 MG tablet Take 2 mg by mouth daily.   atorvastatin (LIPITOR) 20 MG tablet Take 1 tablet (20 mg total) by mouth daily.   buPROPion (ZYBAN) 150 MG 12 hr tablet Take 150 mg by mouth 2 (two) times daily.   citalopram (CELEXA) 20 MG tablet Take 1 tablet by mouth daily.   dicyclomine (BENTYL) 10 MG  capsule Take 10 mg by mouth in the morning and at bedtime.   gabapentin (NEURONTIN) 300 MG capsule Take 300 mg by mouth 2 (two) times daily. Take 1 capsule  in AM and 1 capsule q PM and 2 QHS for nerve pain   glipiZIDE (GLUCOTROL XL) 5 MG 24 hr tablet Take 5 mg by mouth 2 (two) times daily.   JARDIANCE 10 MG TABS tablet Take 1 tablet (10 mg total) by mouth daily.   LORazepam (ATIVAN) 0.5 MG tablet Take 0.5 mg by mouth every 8 (eight) hours as needed for anxiety.    magnesium oxide (MAG-OX) 400 (240 Mg) MG tablet Take 400 mg by mouth daily.   meloxicam (MOBIC) 15 MG tablet Take 1 tablet (15 mg total) by mouth daily.   metFORMIN (GLUCOPHAGE) 1000 MG tablet Take 1 tablet (1,000 mg total) by mouth  2 (two) times daily with a meal.   omeprazole (PRILOSEC) 20 MG capsule Take 20 mg by mouth 2 (two) times daily before a meal.   sacubitril-valsartan (ENTRESTO) 97-103 MG Take 1 tablet by mouth 2 (two) times daily.   Semaglutide, 2 MG/DOSE, 8 MG/3ML SOPN Inject 2 mg as directed once a week.   spironolactone (ALDACTONE) 25 MG tablet Take 12.5 mg by mouth daily.   torsemide 60 MG TABS Take 60 mg by mouth 2 (two) times daily.   Vitamin D, Ergocalciferol, (DRISDOL) 50000 units CAPS capsule Take 1 capsule by mouth 3 (three) times a week.   [DISCONTINUED] carvedilol (COREG) 25 MG tablet Take 1 tablet (25 mg total) by mouth 2 (two) times daily with a meal.   [DISCONTINUED] torsemide (DEMADEX) 20 MG tablet Take 60mg  in the morning and 40mg  in the evening     Allergies:   Patient has no known allergies.   Social History   Socioeconomic History   Marital status: Married    Spouse name: Not on file   Number of children: Not on file   Years of education: Not on file   Highest education level: Not on file  Occupational History   Occupation: administrative for [employer]  Tobacco Use   Smoking status: Former    Packs/day: 0.25    Years: 36.00    Additional pack years: 0.00    Total pack years: 9.00    Types: Cigarettes    Quit date: 11/18/2021    Years since quitting: 1.1   Smokeless tobacco: Never   Tobacco comments:    QUIT SMOKING IN APRIL  Vaping Use   Vaping Use: Every day  Substance and Sexual Activity   Alcohol use: Not Currently    Comment: maybe once a month   Drug use: No   Sexual activity: Not on file  Other Topics Concern   Not on file  Social History Narrative   Not on file   Social Determinants of Health   Financial Resource Strain: Unknown (07/17/2019)   Overall Financial Resource Strain (CARDIA)    Difficulty of Paying Living Expenses: Patient declined  Food Insecurity: Unknown (07/17/2019)   Hunger Vital Sign    Worried About Running Out of Food in the Last Year:  Patient declined    Ran Out of Food in the Last Year: Patient declined  Transportation Needs: Unknown (07/17/2019)   PRAPARE - Administrator, Civil Service (Medical): Patient declined    Lack of Transportation (Non-Medical): Patient declined  Physical Activity: Unknown (07/17/2019)   Exercise Vital Sign    Days of Exercise per  Week: Patient declined    Minutes of Exercise per Session: Patient declined  Stress: Not on file  Social Connections: Unknown (07/17/2019)   Social Connection and Isolation Panel [NHANES]    Frequency of Communication with Friends and Family: Patient declined    Frequency of Social Gatherings with Friends and Family: Patient declined    Attends Religious Services: Patient declined    Database administrator or Organizations: Patient declined    Attends Engineer, structural: Patient declined    Marital Status: Patient declined     Family History: The patient's family history includes Bladder Cancer in his maternal grandfather; CVA in his father; Cervical cancer in his maternal grandmother; Cirrhosis in his maternal grandfather; Diabetes in his father; Heart disease (age of onset: 73) in his brother; Hypertension in his mother; Throat cancer in his father, maternal grandfather, and maternal grandmother. There is no history of Colon cancer.  ROS:   Please see the history of present illness.     All other systems reviewed and are negative.  EKGs/Labs/Other Studies Reviewed:    The following studies were reviewed today:   EKG:  EKG is ordered today.  EKG shows normal sinus rhythm, normal ECG.  Recent Labs: 07/17/2022: ALT 15 09/10/2022: B Natriuretic Peptide 12.7; Hemoglobin 12.9; Platelets 298 01/01/2023: BUN 21; Creatinine, Ser 1.25; Potassium 4.3; Sodium 133  Recent Lipid Panel    Component Value Date/Time   CHOL  12/16/2008 0340    185        ATP III CLASSIFICATION:  <200     mg/dL   Desirable  161-096  mg/dL   Borderline High  >=045     mg/dL   High          TRIG 409 (H) 12/16/2008 0340   HDL 37 (L) 12/16/2008 0340   CHOLHDL 5.0 12/16/2008 0340   VLDL 41 (H) 12/16/2008 0340   LDLCALC (H) 12/16/2008 0340    107        Total Cholesterol/HDL:CHD Risk Coronary Heart Disease Risk Table                     Men   Women  1/2 Average Risk   3.4   3.3  Average Risk       5.0   4.4  2 X Average Risk   9.6   7.1  3 X Average Risk  23.4   11.0        Use the calculated Patient Ratio above and the CHD Risk Table to determine the patient's CHD Risk.        ATP III CLASSIFICATION (LDL):  <100     mg/dL   Optimal  811-914  mg/dL   Near or Above                    Optimal  130-159  mg/dL   Borderline  782-956  mg/dL   High  >213     mg/dL   Very High     Risk Assessment/Calculations:          Physical Exam:    VS:  BP 106/82 (BP Location: Left Arm, Patient Position: Sitting, Cuff Size: Large)   Pulse 99   Ht 6\' 2"  (1.88 m)   Wt (!) 323 lb 12.8 oz (146.9 kg)   SpO2 98%   BMI 41.57 kg/m     Wt Readings from Last 3 Encounters:  01/12/23 (!) 323 lb 12.8 oz (146.9  kg)  12/07/22 (!) 322 lb 8 oz (146.3 kg)  12/01/22 (!) 319 lb 6.4 oz (144.9 kg)     GEN:  Well nourished, well developed in no acute distress HEENT: Normal NECK: No JVD; No carotid bruits CARDIAC: RRR, no murmurs, rubs, gallops RESPIRATORY: Diminished breath sounds at bases, no wheezing ABDOMEN: Soft, non-tender, distended MUSCULOSKELETAL: trace edema; No deformity  SKIN: Warm and dry NEUROLOGIC:  Alert and oriented x 3 PSYCHIATRIC:  Normal affect   ASSESSMENT:    1. NICM (nonischemic cardiomyopathy) (HCC)   2. Primary hypertension   3. Medication management    PLAN:    In order of problems listed above:  Nonischemic cardiomyopathy, etiology possibly hereditary (brother with HF at age 59).  Initial echo 20 to 25%, last EF 50%.  Increase torsemide to 60 mg twice daily, check BMP in 10 days.  Abdominal distention, NYHA class III symptoms.   Due to dizziness, fatigue, low BP, reduce Coreg to 12.5 mg twice daily.  Continue Entresto 97-103 mg twice daily, Aldactone 12.5 mg daily, Jardiance.  Appreciate input from heart failure service.  Check BMP in 10 days. Hypertension, BP controlled, low at home.  Reduce Coreg to 12.5 mg twice daily as above.  Continue Entresto, Aldactone.  Follow-up in 3 months.     Medication Adjustments/Labs and Tests Ordered: Current medicines are reviewed at length with the patient today.  Concerns regarding medicines are outlined above.  Orders Placed This Encounter  Procedures   Basic metabolic panel   Meds ordered this encounter  Medications   carvedilol (COREG) 12.5 MG tablet    Sig: Take 1 tablet (12.5 mg total) by mouth 2 (two) times daily with a meal.    Dispense:  180 tablet    Refill:  3   torsemide 60 MG TABS    Sig: Take 60 mg by mouth 2 (two) times daily.    Dispense:  180 tablet    Refill:  3    Patient Instructions  Medication Instructions:  Your physician recommends the following medication changes.  DECREASE: Carvedilol to 12.5 mg by mouth twice a day  INCREASE: Torsemide to 60 mg by mouth twice a day  *If you need a refill on your cardiac medications before your next appointment, please call your pharmacy*   Lab Work: Your provider would like for you to return in 10 days to have the following labs drawn: (BMP).   Please go to the [facility] entrance and check in at the front desk.  You do not need an appointment.  They are open from 7am-6 pm.  You will not need to be fasting.    Testing/Procedures: None ordered today   Follow-Up: At [facility], you and your health needs are our priority.  As part of our continuing mission to provide you with exceptional heart care, we have created designated Provider Care Teams.  These Care Teams include your primary Cardiologist (physician) and Advanced Practice Providers (APPs -  Physician Assistants and Nurse  Practitioners) who all work together to provide you with the care you need, when you need it.  We recommend signing up for the patient portal called "MyChart".  Sign up information is provided on this After Visit Summary.  MyChart is used to connect with patients for Virtual Visits (Telemedicine).  Patients are able to view lab/test results, encounter notes, upcoming appointments, etc.  Non-urgent messages can be sent to your provider as well.   To learn more about what  you can do with MyChart, go to [email or web address].    Your next appointment:   3 month(s)  Provider:   You may see Debbe Odea, MD or one of the following Advanced Practice Providers on your designated Care Team:   Nicolasa Ducking, NP Eula Listen, PA-C Cadence Fransico Michael, PA-C Charlsie Quest, NP       Signed, Debbe Odea, MD  01/12/2023 2:36 PM    [facility]

## 2023-01-12 NOTE — Patient Instructions (Signed)
Medication Instructions:  Your physician recommends the following medication changes.  DECREASE: Carvedilol to 12.5 mg by mouth twice a day  INCREASE: Torsemide to 60 mg by mouth twice a day  *If you need a refill on your cardiac medications before your next appointment, please call your pharmacy*   Lab Work: Your provider would like for you to return in 10 days to have the following labs drawn: (BMP).   Please go to the [facility] entrance and check in at the front desk.  You do not need an appointment.  They are open from 7am-6 pm.  You will not need to be fasting.    Testing/Procedures: None ordered today   Follow-Up: At [facility], you and your health needs are our priority.  As part of our continuing mission to provide you with exceptional heart care, we have created designated Provider Care Teams.  These Care Teams include your primary Cardiologist (physician) and Advanced Practice Providers (APPs -  Physician Assistants and Nurse Practitioners) who all work together to provide you with the care you need, when you need it.  We recommend signing up for the patient portal called "MyChart".  Sign up information is provided on this After Visit Summary.  MyChart is used to connect with patients for Virtual Visits (Telemedicine).  Patients are able to view lab/test results, encounter notes, upcoming appointments, etc.  Non-urgent messages can be sent to your provider as well.   To learn more about what you can do with MyChart, go to [email or web address].    Your next appointment:   3 month(s)  Provider:   You may see Debbe Odea, MD or one of the following Advanced Practice Providers on your designated Care Team:   Nicolasa Ducking, NP Eula Listen, PA-C Cadence Fransico Michael, PA-C Charlsie Quest, NP

## 2023-01-15 ENCOUNTER — Telehealth: Payer: Self-pay

## 2023-01-15 NOTE — Telephone Encounter (Signed)
*  [facility]  PA request received via CMM for Soaanz 60MG  tablets  PA submitted to [employer] and has been denied due to Product/Service Not Covered- Plan Benefit/Exclusion  Key: [id number]

## 2023-01-22 ENCOUNTER — Ambulatory Visit: Payer: PRIVATE HEALTH INSURANCE | Attending: Family | Admitting: Family

## 2023-01-22 ENCOUNTER — Encounter: Payer: Self-pay | Admitting: Family

## 2023-01-22 VITALS — BP 91/56 | HR 95 | Ht 74.0 in | Wt 321.0 lb

## 2023-01-22 DIAGNOSIS — R002 Palpitations: Secondary | ICD-10-CM | POA: Diagnosis not present

## 2023-01-22 DIAGNOSIS — Z79899 Other long term (current) drug therapy: Secondary | ICD-10-CM | POA: Insufficient documentation

## 2023-01-22 DIAGNOSIS — E119 Type 2 diabetes mellitus without complications: Secondary | ICD-10-CM | POA: Diagnosis not present

## 2023-01-22 DIAGNOSIS — I110 Hypertensive heart disease with heart failure: Secondary | ICD-10-CM | POA: Diagnosis not present

## 2023-01-22 DIAGNOSIS — I10 Essential (primary) hypertension: Secondary | ICD-10-CM | POA: Diagnosis not present

## 2023-01-22 DIAGNOSIS — G4733 Obstructive sleep apnea (adult) (pediatric): Secondary | ICD-10-CM | POA: Insufficient documentation

## 2023-01-22 DIAGNOSIS — I428 Other cardiomyopathies: Secondary | ICD-10-CM | POA: Diagnosis not present

## 2023-01-22 DIAGNOSIS — Z6841 Body Mass Index (BMI) 40.0 and over, adult: Secondary | ICD-10-CM | POA: Insufficient documentation

## 2023-01-22 DIAGNOSIS — K760 Fatty (change of) liver, not elsewhere classified: Secondary | ICD-10-CM | POA: Diagnosis present

## 2023-01-22 DIAGNOSIS — I890 Lymphedema, not elsewhere classified: Secondary | ICD-10-CM | POA: Insufficient documentation

## 2023-01-22 DIAGNOSIS — I420 Dilated cardiomyopathy: Secondary | ICD-10-CM | POA: Insufficient documentation

## 2023-01-22 DIAGNOSIS — I5022 Chronic systolic (congestive) heart failure: Secondary | ICD-10-CM | POA: Insufficient documentation

## 2023-01-22 DIAGNOSIS — I2510 Atherosclerotic heart disease of native coronary artery without angina pectoris: Secondary | ICD-10-CM | POA: Diagnosis not present

## 2023-01-22 DIAGNOSIS — Z794 Long term (current) use of insulin: Secondary | ICD-10-CM

## 2023-01-22 DIAGNOSIS — J9611 Chronic respiratory failure with hypoxia: Secondary | ICD-10-CM | POA: Diagnosis not present

## 2023-01-22 DIAGNOSIS — H9190 Unspecified hearing loss, unspecified ear: Secondary | ICD-10-CM | POA: Diagnosis not present

## 2023-01-22 NOTE — Patient Instructions (Signed)
I've made a referral to the exercise rehab program. They will call you

## 2023-01-22 NOTE — Progress Notes (Signed)
PCP: Smith Robert, MD (Inactive) Primary Cardiologist: Debbe Odea, MD (last seen 05/24) HF provider: Arvilla Meres, MD (last seen  HPI:  [name] Gregory is a 52 y.o. male with a history of HTN, T2DM, tobacco abuse, fatty liver, lymphedema, obesity, OSA (on CPAP) and HFrEF.  Has multiple family members with HF on his father's side. Developed HF symptoms in Jan/Feb 2023. He had a prior TTE in 2020 that showed moderate concentric hypertrophy with normal EF. He was seen by Dr. Jaymes Graff at [facility] 11/03/21 nd underwent echo and stress test. 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 LVEDP was elevated at . Echo same day showed EF 20-25%, LV dilation, hypokinesis of the apical anterior, apical, apical inferior and apical septal segments, and grade III diastolic dysfunction. He also complained that his heart was fluttering, lasts for several seconds. Given this a Ziopatch was placed which was unrevealing. (A several brief runs SVT. 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   Echo 8/23 @ [facility] EF 55%  cMRI: 11/23: EF 52% RV normal. No LGE.  RHC on 06/28/22:  RHC with low filling pressures and high CO.  RA = 3 RV = 35/7 PA = 36/19 (24) PCW = 11 Fick cardiac output/index = 9.0/3.3 PVR = 1.4 WU FA sat = 96% PA sat = 69%, 71% SVC sat = 78%  Returns today for HF f/u with a chief complaint of moderate SOB with minimal exertion. Chronic in nature. Has associated dizziness at times with sudden position changes, off balance (have had ears checked), fatigue and pedal edema along with this. Due to the sense of being off balance he has had a few falls in the last year. Is wearing his compression pumps 2 hours daily with some  improvement of edema. Wearing compression socks daily. Denies chest pain, palpitations, cough, difficulty sleeping or weight gain.   Cardiology had to increase his torsemide back to 60mg  BID due to symptoms. Is getting lab work done in a few days.   CPX testing completed 12/27/22: Markedly submaximal exercise testing with peak RER (0.74) not rising above resting levels. Patient only exercised 2:45 on modified Naughton protocol. Very little can be gleaned from this test but based on existing data patient appears limited by his HF, deconditioning and obesity. Would recommend exercise training program to build stamina and then retest.   ROS: All systems negative except as listed in HPI, PMH and Problem List.  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  Tobacco Use   Smoking status: Former    Packs/day: 0.25    Years: 36.00    Additional pack years: 0.00    Total pack years: 9.00    Types: Cigarettes    Quit date: 11/18/2021    Years since quitting: 1.1   Smokeless tobacco: Never   Tobacco comments:    QUIT SMOKING IN APRIL  Vaping Use   Vaping Use: Every day  Substance and Sexual Activity   Alcohol use: Not Currently    Comment: maybe once  a month   Drug use: No   Sexual activity: Not on file  Other Topics Concern   Not on file  Social History Narrative   Not on file   Social Determinants of Health   Financial Resource Strain: Unknown (07/17/2019)   Overall Financial Resource Strain (CARDIA)    Difficulty of Paying Living Expenses: Patient declined  Food Insecurity: Unknown (07/17/2019)   Hunger Vital Sign    Worried About Running Out of Food in the Last Year: Patient declined    Ran Out of Food in the Last Year: Patient declined  Transportation  Needs: Unknown (07/17/2019)   PRAPARE - Administrator, Civil Service (Medical): Patient declined    Lack of Transportation (Non-Medical): Patient declined  Physical Activity: Unknown (07/17/2019)   Exercise Vital Sign    Days of Exercise per Week: Patient declined    Minutes of Exercise per Session: Patient declined  Stress: Not on file  Social Connections: Unknown (07/17/2019)   Social Connection and Isolation Panel [NHANES]    Frequency of Communication with Friends and Family: Patient declined    Frequency of Social Gatherings with Friends and Family: Patient declined    Attends Religious Services: Patient declined    Database administrator or Organizations: Patient declined    Attends Banker Meetings: Patient declined    Marital Status: Patient declined  Intimate Partner Violence: Unknown (07/17/2019)   Humiliation, Afraid, Rape, and Kick questionnaire    Fear of Current or Ex-Partner: Patient declined    Emotionally Abused: Patient declined    Physically Abused: Patient declined    Sexually Abused: Patient declined    FH:  Family History  Problem Relation Age of Onset   Hypertension Mother    CVA Father    Diabetes Father    Throat cancer Father    Heart disease Brother 29   Cervical cancer Maternal Grandmother    Throat cancer Maternal Grandmother    Throat cancer Maternal Grandfather    Bladder Cancer Maternal Grandfather    Cirrhosis Maternal Grandfather    Colon cancer Neg Hx     Past Medical History:  Diagnosis Date   Carpal tunnel syndrome    CHF (congestive heart failure) (HCC)    Complication of anesthesia    WOKE UP DURING SURGERY   Diabetes (HCC)    Fatty liver    GERD (gastroesophageal reflux disease)    Hemangioma of liver    Hypertension    Rotator cuff disorder    Sleep apnea    URI (upper respiratory infection)     Current Outpatient Medications  Medication Sig Dispense Refill   albuterol (VENTOLIN HFA) 108 (90 Base)  MCG/ACT inhaler Inhale 2 puffs into the lungs every 6 (six) hours as needed for wheezing or shortness of breath. 8 g 2   ARIPiprazole (ABILIFY) 2 MG tablet Take 2 mg by mouth daily.     atorvastatin (LIPITOR) 20 MG tablet Take 1 tablet (20 mg total) by mouth daily. 90 tablet 3   buPROPion (ZYBAN) 150 MG 12 hr tablet Take 150 mg by mouth 2 (two) times daily.     carvedilol (COREG) 12.5 MG tablet Take 1 tablet (12.5 mg total) by mouth 2 (two) times daily with a meal. 180 tablet 3   citalopram (CELEXA) 20 MG tablet Take 1 tablet by mouth daily.     dicyclomine (BENTYL) 10 MG capsule Take 10 mg by mouth in the morning and at bedtime.  gabapentin (NEURONTIN) 300 MG capsule Take 300 mg by mouth 2 (two) times daily. Take 1 capsule  in AM and 1 capsule q PM and 2 QHS for nerve pain     glipiZIDE (GLUCOTROL XL) 5 MG 24 hr tablet Take 5 mg by mouth 2 (two) times daily.     JARDIANCE 10 MG TABS tablet Take 1 tablet (10 mg total) by mouth daily. 90 tablet 3   LORazepam (ATIVAN) 0.5 MG tablet Take 0.5 mg by mouth every 8 (eight) hours as needed for anxiety.      magnesium oxide (MAG-OX) 400 (240 Mg) MG tablet Take 400 mg by mouth daily.     meloxicam (MOBIC) 15 MG tablet Take 1 tablet (15 mg total) by mouth daily. 30 tablet 1   metFORMIN (GLUCOPHAGE) 1000 MG tablet Take 1 tablet (1,000 mg total) by mouth 2 (two) times daily with a meal. 180 tablet 3   metroNIDAZOLE (FLAGYL) 500 MG tablet Take 500 mg by mouth 2 (two) times daily. (Patient not taking: Reported on 01/12/2023)     omeprazole (PRILOSEC) 20 MG capsule Take 20 mg by mouth 2 (two) times daily before a meal.     sacubitril-valsartan (ENTRESTO) 97-103 MG Take 1 tablet by mouth 2 (two) times daily. 60 tablet 6   Semaglutide, 2 MG/DOSE, 8 MG/3ML SOPN Inject 2 mg as directed once a week. 3 mL 3   spironolactone (ALDACTONE) 25 MG tablet Take 12.5 mg by mouth daily.     torsemide 60 MG TABS Take 60 mg by mouth 2 (two) times daily. 180 tablet 3   Vitamin  D, Ergocalciferol, (DRISDOL) 50000 units CAPS capsule Take 1 capsule by mouth 3 (three) times a week.     No current facility-administered medications for this visit.    Vitals:   01/22/23 1433  BP: (!) 91/56  Pulse: 95  SpO2: 100%  Weight: (!) 321 lb (145.6 kg)  Height: 6\' 2"  (1.88 m)   Wt Readings from Last 3 Encounters:  01/22/23 (!) 321 lb (145.6 kg)  01/12/23 (!) 323 lb 12.8 oz (146.9 kg)  12/07/22 (!) 322 lb 8 oz (146.3 kg)   Lab Results  Component Value Date   CREATININE 1.25 (H) 01/01/2023   CREATININE 1.34 (H) 12/07/2022   CREATININE 1.09 09/10/2022   PHYSICAL EXAM:  General:  Well appearing. No resp difficulty HEENT: normal Neck: supple. JVP flat. No lymphadenopathy or thryomegaly appreciated. Cor: PMI normal. Regular rate & rhythm. No rubs, gallops or murmurs. Lungs: clear Abdomen: soft, nontender, nondistended. No hepatosplenomegaly. No bruits or masses.  Extremities: no cyanosis, clubbing, rash, trace pitting edema Neuro: alert & orientedx3, cranial nerves grossly intact. Moves all 4 extremities w/o difficulty. Affect pleasant.   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 Referred to genetics for further discussion - SPEP/IFE showed no monoclonal protein - NYHA class III - euvolemic - weight down 1 pound from last visit here 6 weeks ago - Echo 4/24 EF 20-25% - Cath 4/23 non-obstructive CAD - Echo 8/23 [facility]) with recovered LVEF 55%, LVIDd ~5.5, normal RV, RVSP ~29 - cMRI: 11/23: EF 52% RV normal. No LGE. -  RHC 11/23  RA 3 PA 36/19 (24) PCW 11 Fick CO/CI 9.0/3.3 PA sat = 69%, 71% SVC  sat = 78% - RHC with elevated CO but no evidence of shunt of previous ab CT with contrast or cMRI - continue torsemide 60mg  BID; cardiology increased it back due to worsening symptoms when it was decreased to 60AM/ 40PM; has lab work in a few days - Continue carvedilol 25 bid  - Continue Jardiance 10mg  daily - Continue Entresto 97/103 bid  - Continue Spiro 12.5 daily - Has seen [facility] - continue lymphedema pumps & compression socks - Cardiac symptoms remain out of proportion to objective findings.  - CPX 12/27/22: Markedly submaximal exercise testing with peak RER (0.74) not rising above resting levels. Patient only exercised 2:45 on modified Naughton protocol. Very little can be gleaned from this test but based on existing data patient appears limited by his HF, deconditioning and obesity. Would recommend exercise training program to build stamina and then retest.  - pulmonary rehab referral placed today - BNP 09/10/22 was 12.7  2: Essential hypertension- - BP 91/56 - followed by PCP - BMP 01/01/23 showed sodium 133, potassium 4.3, creatinine 1.25 & GFR >60  3: DM2- - Followed by PCP - continue jardiance 10mg  daily - A1c 10/10/22 was 6.8%  4: Palpitations- - Zio Apr 2023, no significant arrhythmias, rare ventricular ectopy  5: Morbid obesity- - Body mass index is 41.41 kg/m. - continue ozempic  6: Chronic Hypoxic Respiratory Failure- - Per patient, was dropping O2 sat with exertion and qualified for home O2 - Unclear why he's requiring O2. Has been evaluted by Pulmonology (Dr. Vassie Loll) 12/23; returns in 2 days - CPX 12/27/22: Markedly submaximal exercise testing with peak RER (0.74) not rising above resting levels. Patient only exercised 2:45 on modified Naughton protocol. Very little can be gleaned from this test but based on existing data patient appears limited by his HF, deconditioning and obesity. Would recommend exercise training program to build stamina and then retest.  - pulmonary  rehab referral placed today; results of CPX testing given to patient  Return in 6 weeks, sooner if needed.

## 2023-01-23 ENCOUNTER — Other Ambulatory Visit: Payer: Self-pay | Admitting: *Deleted

## 2023-01-23 DIAGNOSIS — I5022 Chronic systolic (congestive) heart failure: Secondary | ICD-10-CM

## 2023-01-24 ENCOUNTER — Ambulatory Visit: Payer: PRIVATE HEALTH INSURANCE | Admitting: Primary Care

## 2023-02-13 ENCOUNTER — Ambulatory Visit: Payer: PRIVATE HEALTH INSURANCE | Admitting: Nurse Practitioner

## 2023-02-13 ENCOUNTER — Ambulatory Visit: Payer: PRIVATE HEALTH INSURANCE | Attending: Cardiology

## 2023-02-13 ENCOUNTER — Other Ambulatory Visit
Admission: RE | Admit: 2023-02-13 | Discharge: 2023-02-13 | Disposition: A | Discharge status: Home / Self Care | Payer: PRIVATE HEALTH INSURANCE | Source: Ambulatory Visit | Attending: Cardiology | Admitting: Cardiology

## 2023-02-13 DIAGNOSIS — Z79899 Other long term (current) drug therapy: Secondary | ICD-10-CM | POA: Insufficient documentation

## 2023-02-13 DIAGNOSIS — I428 Other cardiomyopathies: Secondary | ICD-10-CM

## 2023-02-13 DIAGNOSIS — I509 Heart failure, unspecified: Secondary | ICD-10-CM | POA: Diagnosis not present

## 2023-02-13 LAB — ECHOCARDIOGRAM COMPLETE
AR max vel: 3.81 cm2
AV Area VTI: 4.08 cm2
AV Area mean vel: 3.87 cm2
AV Mean grad: 3 mmHg
AV Peak grad: 5.5 mmHg
Ao pk vel: 1.18 m/s
Area-P 1/2: 5.02 cm2
Calc EF: 50.3 %
S' Lateral: 3.9 cm
Single Plane A2C EF: 50.5 %
Single Plane A4C EF: 49.1 %

## 2023-02-13 LAB — BASIC METABOLIC PANEL
Anion gap: 11 (ref 5–15)
BUN: 23 mg/dL — ABNORMAL HIGH (ref 6–20)
CO2: 26 mmol/L (ref 22–32)
Calcium: 9.2 mg/dL (ref 8.9–10.3)
Chloride: 98 mmol/L (ref 98–111)
Creatinine, Ser: 0.94 mg/dL (ref 0.61–1.24)
GFR, Estimated: >60 mL/min (ref >60)
Glucose, Bld: 165 mg/dL — ABNORMAL HIGH (ref 70–99)
Potassium: 4.1 mmol/L (ref 3.5–5.1)
Sodium: 135 mmol/L (ref 135–145)

## 2023-02-20 ENCOUNTER — Encounter: Payer: Self-pay | Admitting: Nurse Practitioner

## 2023-02-20 ENCOUNTER — Ambulatory Visit: Payer: PRIVATE HEALTH INSURANCE

## 2023-02-20 ENCOUNTER — Ambulatory Visit (INDEPENDENT_AMBULATORY_CARE_PROVIDER_SITE_OTHER): Payer: PRIVATE HEALTH INSURANCE | Admitting: Nurse Practitioner

## 2023-02-20 VITALS — BP 100/65 | HR 99 | Ht 74.0 in | Wt 318.8 lb

## 2023-02-20 DIAGNOSIS — Z7984 Long term (current) use of oral hypoglycemic drugs: Secondary | ICD-10-CM

## 2023-02-20 DIAGNOSIS — E119 Type 2 diabetes mellitus without complications: Secondary | ICD-10-CM

## 2023-02-20 DIAGNOSIS — Z7985 Long-term (current) use of injectable non-insulin antidiabetic drugs: Secondary | ICD-10-CM | POA: Diagnosis not present

## 2023-02-20 LAB — POCT GLYCOSYLATED HEMOGLOBIN (HGB A1C): Hemoglobin A1C: 6.6 % — AB (ref 4.0–5.6)

## 2023-02-20 MED ORDER — GLIPIZIDE ER 5 MG PO TB24: 5.0000 mg | ORAL_TABLET | Freq: Every day | ORAL | 1 refills | Status: DC

## 2023-02-20 MED ORDER — JARDIANCE 10 MG PO TABS: 10.0000 mg | ORAL_TABLET | Freq: Every day | ORAL | 3 refills | Status: DC

## 2023-02-20 MED ORDER — METFORMIN HCL 1000 MG PO TABS: 1000.0000 mg | ORAL_TABLET | Freq: Two times a day (BID) | ORAL | 3 refills | Status: DC

## 2023-02-20 MED ORDER — SEMAGLUTIDE (2 MG/DOSE) 8 MG/3ML ~~LOC~~ SOPN
2.0000 mg | PEN_INJECTOR | SUBCUTANEOUS | 3 refills | Status: DC
Start: 2023-02-20 — End: 2023-03-29

## 2023-02-20 NOTE — Progress Notes (Signed)
Endocrinology Follow Up Note       02/20/2023, 11:33 AM   Subjective:    Patient ID: [name] Gregory, male    DOB: 1970/09/11.  [name] Gregory is being seen in follow up after being seen in consultation for management of currently uncontrolled symptomatic diabetes requested by  Smith Robert, MD.   Past Medical History:  Diagnosis Date   Carpal tunnel syndrome    CHF (congestive heart failure) (HCC)    Complication of anesthesia    WOKE UP DURING SURGERY   Diabetes (HCC)    Fatty liver    GERD (gastroesophageal reflux disease)    Hemangioma of liver    Hypertension    Rotator cuff disorder    Sleep apnea    URI (upper respiratory infection)     Past Surgical History:  Procedure Laterality Date   BIOPSY  05/04/2016   Procedure: BIOPSY;  Surgeon: Corbin Ade, MD;  Location: [facility];  Service: Endoscopy;;  gastric bx   CARDIAC CATHETERIZATION Bilateral 10/2021   COLONOSCOPY WITH PROPOFOL N/A 05/04/2016    PROPOFOL;  Surgeon: Corbin Ade, MD; diverticulosis throughout the entire colon, otherwise normal.  Repeat due in 2027.   ESOPHAGOGASTRODUODENOSCOPY (EGD) WITH PROPOFOL N/A 05/04/2016   PROPOFOL;  Surgeon: Corbin Ade, MD; LA grade B esophagitis, gastritis, small hiatal hernia.   EYE SURGERY     RIGHT HEART CATH Right 06/28/2022   Procedure: RIGHT HEART CATH;  Surgeon: Dolores Patty, MD;  Location: [facility];  Service: Cardiovascular;  Laterality: Right;   SALIVARY GLAND SURGERY  1992    Social History   Socioeconomic History   Marital status: Married    Spouse name: Not on file   Number of children: Not on file   Years of education: Not on file   Highest education level: Not on file  Occupational History   Occupation: administrative for [employer]  Tobacco Use   Smoking status: Former    Packs/day: 0.25    Years: 36.00    Additional pack years: 0.00    Total pack  years: 9.00    Types: Cigarettes    Quit date: 11/18/2021    Years since quitting: 1.2   Smokeless tobacco: Never   Tobacco comments:    QUIT SMOKING IN APRIL  Vaping Use   Vaping Use: Every day  Substance and Sexual Activity   Alcohol use: Not Currently    Comment: maybe once a month   Drug use: No   Sexual activity: Not on file  Other Topics Concern   Not on file  Social History Narrative   Not on file   Social Determinants of Health   Financial Resource Strain: Unknown (07/17/2019)   Overall Financial Resource Strain (CARDIA)    Difficulty of Paying Living Expenses: Patient declined  Food Insecurity: Unknown (07/17/2019)   Hunger Vital Sign    Worried About Running Out of Food in the Last Year: Patient declined    Ran Out of Food in the Last Year: Patient declined  Transportation Needs: Unknown (07/17/2019)   PRAPARE - Administrator, Civil Service (Medical): Patient declined  Lack of Transportation (Non-Medical): Patient declined  Physical Activity: Unknown (07/17/2019)   Exercise Vital Sign    Days of Exercise per Week: Patient declined    Minutes of Exercise per Session: Patient declined  Stress: Not on file  Social Connections: Unknown (07/17/2019)   Social Connection and Isolation Panel [NHANES]    Frequency of Communication with Friends and Family: Patient declined    Frequency of Social Gatherings with Friends and Family: Patient declined    Attends Religious Services: Patient declined    Database administrator or Organizations: Patient declined    Attends Banker Meetings: Patient declined    Marital Status: Patient declined    Family History  Problem Relation Age of Onset   Hypertension Mother    CVA Father    Diabetes Father    Throat cancer Father    Heart disease Brother 29   Cervical cancer Maternal Grandmother    Throat cancer Maternal Grandmother    Throat cancer Maternal Grandfather    Bladder Cancer Maternal Grandfather     Cirrhosis Maternal Grandfather    Colon cancer Neg Hx     Outpatient Encounter Medications as of 02/20/2023  Medication Sig   albuterol (VENTOLIN HFA) 108 (90 Base) MCG/ACT inhaler Inhale 2 puffs into the lungs every 6 (six) hours as needed for wheezing or shortness of breath.   ARIPiprazole (ABILIFY) 2 MG tablet Take 2 mg by mouth daily.   atorvastatin (LIPITOR) 20 MG tablet Take 1 tablet (20 mg total) by mouth daily.   buPROPion (ZYBAN) 150 MG 12 hr tablet Take 150 mg by mouth 2 (two) times daily.   carvedilol (COREG) 12.5 MG tablet Take 1 tablet (12.5 mg total) by mouth 2 (two) times daily with a meal.   citalopram (CELEXA) 20 MG tablet Take 1 tablet by mouth daily.   dicyclomine (BENTYL) 10 MG capsule Take 10 mg by mouth in the morning and at bedtime.   gabapentin (NEURONTIN) 300 MG capsule Take 300 mg by mouth 2 (two) times daily. Take 1 capsule  in AM and 1 capsule q PM and 2 QHS for nerve pain   LORazepam (ATIVAN) 0.5 MG tablet Take 0.5 mg by mouth every 8 (eight) hours as needed for anxiety.    magnesium oxide (MAG-OX) 400 (240 Mg) MG tablet Take 400 mg by mouth daily.   meloxicam (MOBIC) 15 MG tablet Take 1 tablet (15 mg total) by mouth daily.   omeprazole (PRILOSEC) 20 MG capsule Take 20 mg by mouth 2 (two) times daily before a meal.   sacubitril-valsartan (ENTRESTO) 97-103 MG Take 1 tablet by mouth 2 (two) times daily.   spironolactone (ALDACTONE) 25 MG tablet Take 12.5 mg by mouth daily.   torsemide 60 MG TABS Take 60 mg by mouth 2 (two) times daily.   Vitamin D, Ergocalciferol, (DRISDOL) 50000 units CAPS capsule Take 1 capsule by mouth 3 (three) times a week.   [DISCONTINUED] glipiZIDE (GLUCOTROL XL) 5 MG 24 hr tablet Take 5 mg by mouth daily with breakfast.   [DISCONTINUED] JARDIANCE 10 MG TABS tablet Take 1 tablet (10 mg total) by mouth daily.   [DISCONTINUED] metFORMIN (GLUCOPHAGE) 1000 MG tablet Take 1 tablet (1,000 mg total) by mouth 2 (two) times daily with a meal.    [DISCONTINUED] Semaglutide, 2 MG/DOSE, 8 MG/3ML SOPN Inject 2 mg as directed once a week.   glipiZIDE (GLUCOTROL XL) 5 MG 24 hr tablet Take 1 tablet (5 mg total) by mouth daily with  breakfast.   JARDIANCE 10 MG TABS tablet Take 1 tablet (10 mg total) by mouth daily.   metFORMIN (GLUCOPHAGE) 1000 MG tablet Take 1 tablet (1,000 mg total) by mouth 2 (two) times daily with a meal.   Semaglutide, 2 MG/DOSE, 8 MG/3ML SOPN Inject 2 mg as directed once a week.   [DISCONTINUED] metroNIDAZOLE (FLAGYL) 500 MG tablet Take 500 mg by mouth 2 (two) times daily. (Patient not taking: Reported on 01/12/2023)   No facility-administered encounter medications on file as of 02/20/2023.    ALLERGIES: No Known Allergies  VACCINATION STATUS: Immunization History  Administered Date(s) Administered   Influenza Split 05/29/2006, 05/14/2016   Influenza,inj,Quad PF,6+ Mos 06/24/2019, 07/01/2020   Influenza-Unspecified 06/13/2011   Moderna Sars-Covid-2 Vaccination 11/12/2019, 12/10/2019   Pneumococcal-Unspecified 06/13/2011   Td 02/04/1991   Tdap 05/10/1996    Diabetes He presents for his follow-up diabetic visit. He has type 2 diabetes mellitus. Onset time: Diagnosed about 8 months ago at age of 45. His disease course has been improving. There are no hypoglycemic associated symptoms. Associated symptoms include fatigue and foot paresthesias. Pertinent negatives for diabetes include no polyuria. There are no hypoglycemic complications. Symptoms are improving. Diabetic complications include heart disease (CHF), nephropathy and peripheral neuropathy. Risk factors for coronary artery disease include diabetes mellitus, family history, male sex, obesity, hypertension and sedentary lifestyle. Current diabetic treatment includes oral agent (dual therapy) (and Ozempic). He is compliant with treatment most of the time. His weight is fluctuating minimally. He is following a generally healthy diet. Meal planning includes avoidance of  concentrated sweets. He has not had a previous visit with a dietitian. He rarely participates in exercise. His home blood glucose trend is fluctuating minimally. His breakfast blood glucose range is generally 90-110 mg/dl. (He presents today with his meter, no logs, showing stable, at target fasting glycemic profile.  His POCT A1c today is 6.6%, improving from last visit of 6.8%.  Analysis of his meter shows fasting glucose ranging between 92-125.  He denies any hypoglycemia.  He has been taking Glipizide 5 mg XL twice daily prescribed by his PCP (says he thinks when he requested refills they accidentally sent it in).) An ACE inhibitor/angiotensin II receptor blocker is being taken. He does not see a podiatrist.Eye exam is current.    Review of systems  Constitutional: + Minimally fluctuating body weight,  current Body mass index is 40.93 kg/m. , no fatigue, no subjective hyperthermia, no subjective hypothermia Eyes: no blurry vision, no xerophthalmia ENT: no sore throat, no nodules palpated in throat, no dysphagia/odynophagia, no hoarseness Cardiovascular: no chest pain, no shortness of breath, no palpitations, no leg swelling Respiratory: no cough, + shortness of breath- on portable O2 Gastrointestinal: no nausea/vomiting/diarrhea Musculoskeletal: right shoulder pain (getting MRI done today for possible rotator cuff injury) Skin: no rashes, no hyperemia Neurological: no tremors, no numbness, no tingling, no dizziness Psychiatric: no depression, no anxiety  Objective:     BP 100/65 (BP Location: Left Arm, Patient Position: Sitting, Cuff Size: Large)   Pulse 99   Ht 6\' 2"  (1.88 m)   Wt (!) 318 lb 12.8 oz (144.6 kg)   BMI 40.93 kg/m   Wt Readings from Last 3 Encounters:  02/20/23 (!) 318 lb 12.8 oz (144.6 kg)  01/22/23 (!) 321 lb (145.6 kg)  01/12/23 (!) 323 lb 12.8 oz (146.9 kg)     BP Readings from Last 3 Encounters:  02/20/23 100/65  01/22/23 (!) 91/56  01/12/23 106/82  Physical Exam- Limited  Constitutional:  Body mass index is 40.93 kg/m. , not in acute distress, normal state of mind Eyes:  EOMI, no exophthalmos Musculoskeletal: no gross deformities, strength intact in all four extremities, no gross restriction of joint movements Skin:  no rashes, no hyperemia Neurological: no tremor with outstretched hands    CMP ( most recent) CMP     Component Value Date/Time   NA 135 02/13/2023 1228   K 4.1 02/13/2023 1228   CL 98 02/13/2023 1228   CO2 26 02/13/2023 1228   GLUCOSE 165 (H) 02/13/2023 1228   BUN 23 (H) 02/13/2023 1228   BUN 28 (A) 12/29/2021 0000   CREATININE 0.94 02/13/2023 1228   CREATININE 0.72 03/18/2016 1352   CALCIUM 9.2 02/13/2023 1228   PROT 7.0 07/17/2022 1232   ALBUMIN 3.8 07/17/2022 1232   AST 14 (L) 07/17/2022 1232   ALT 15 07/17/2022 1232   ALKPHOS 105 07/17/2022 1232   BILITOT 0.8 07/17/2022 1232   GFRNONAA >60 02/13/2023 1228   GFRNONAA >89 03/18/2016 1352   GFRAA >60 07/18/2019 0430   GFRAA >89 03/18/2016 1352     Diabetic Labs (most recent): Lab Results  Component Value Date   HGBA1C 6.6 (A) 02/20/2023   HGBA1C 6.8 (A) 10/10/2022   HGBA1C 5.9 (A) 07/10/2022     Lipid Panel ( most recent) Lipid Panel     Component Value Date/Time   CHOL  12/16/2008 0340    185        ATP III CLASSIFICATION:  <200     mg/dL   Desirable  161-096  mg/dL   Borderline High  >=045    mg/dL   High          TRIG 409 (H) 12/16/2008 0340   HDL 37 (L) 12/16/2008 0340   CHOLHDL 5.0 12/16/2008 0340   VLDL 41 (H) 12/16/2008 0340   LDLCALC (H) 12/16/2008 0340    107        Total Cholesterol/HDL:CHD Risk Coronary Heart Disease Risk Table                     Men   Women  1/2 Average Risk   3.4   3.3  Average Risk       5.0   4.4  2 X Average Risk   9.6   7.1  3 X Average Risk  23.4   11.0        Use the calculated Patient Ratio above and the CHD Risk Table to determine the patient's CHD Risk.        ATP III  CLASSIFICATION (LDL):  <100     mg/dL   Optimal  811-914  mg/dL   Near or Above                    Optimal  130-159  mg/dL   Borderline  782-956  mg/dL   High  >213     mg/dL   Very High             Assessment & Plan:   1) Type 2 diabetes mellitus without complication, without long-term current use of insulin (HCC)  He presents today with his meter, no logs, showing stable, at target fasting glycemic profile.  His POCT A1c today is 6.6%, improving from last visit of 6.8%.  Analysis of his meter shows fasting glucose ranging between 92-125.  He denies any hypoglycemia.  He has been taking Glipizide 5  mg XL twice daily prescribed by his PCP (says he thinks when he requested refills they accidentally sent it in).  [name] Gregory has currently uncontrolled symptomatic type 2 DM since 52 years of age.   -Recent labs reviewed.  - I had a long discussion with him about the progressive nature of diabetes and the pathology behind its complications. -his diabetes is complicated by CHF, neuropathy and he remains at a high risk for more acute and chronic complications which include CAD, CVA, CKD, retinopathy, and neuropathy. These are all discussed in detail with him.  The following Lifestyle Medicine recommendations according to American College of Lifestyle Medicine [facility]) were discussed and offered to patient and he agrees to start the journey:  A. Whole Foods, Plant-based plate comprising of fruits and vegetables, plant-based proteins, whole-grain carbohydrates was discussed in detail with the patient.   A list for source of those nutrients were also provided to the patient.  Patient will use only water or unsweetened tea for hydration. B.  The need to stay away from risky substances including alcohol, smoking; obtaining 7 to 9 hours of restorative sleep, at least 150 minutes of moderate intensity exercise weekly, the importance of healthy social connections,  and stress reduction techniques  were discussed. C.  A full color page of  Calorie density of various food groups per pound showing examples of each food groups was provided to the patient.  - Nutritional counseling repeated at each appointment due to patients tendency to fall back in to old habits.  - The patient admits there is a room for improvement in their diet and drink choices. -  Suggestion is made for the patient to avoid simple carbohydrates from their diet including Cakes, Sweet Desserts / Pastries, Ice Cream, Soda (diet and regular), Sweet Tea, Candies, Chips, Cookies, Sweet Pastries, Store Bought Juices, Alcohol in Excess of 1-2 drinks a day, Artificial Sweeteners, Coffee Creamer, and "Sugar-free" Products. This will help patient to have stable blood glucose profile and potentially avoid unintended weight gain.   - I encouraged the patient to switch to unprocessed or minimally processed complex starch and increased protein intake (animal or plant source), fruits, and vegetables.   - Patient is advised to stick to a routine mealtimes to eat 3 meals a day and avoid unnecessary snacks (to snack only to correct hypoglycemia).  - I have approached him with the following individualized plan to manage his diabetes and patient agrees:   - he is advised to continue Ozempic 2 mg SQ weekly, continue Jardiance 10 mg po daily and Metformin 1000 mg po twice daily after meals (to help minimize GI SE), therapeutically suitable for patient.  I decreased his Glipizide to 5 mg XL daily with breakfast (script resent by PCP to office).  May try to discontinue this completely next visit.  -he is encouraged to continue monitoring glucose at least once daily, before breakfast and call if glucose is less than 70 or above 300 for 3 tests in a row.  - Adjustment parameters are given to him for hypo and hyperglycemia in writing.  - Specific targets for  A1c; LDL, HDL, and Triglycerides were discussed with the patient.  2) Blood Pressure  /Hypertension:  his blood pressure is controlled to target.   he is advised to continue his current medications including Demadex 40 mg po twice daily, Entresto 49-51 mg po daily, and Coreg 25 mg po twice daily.  3) Lipids/Hyperlipidemia:    Review of his recent  lipid panel from 12/26/21 showed controlled LDL at 62 .  he is advised to continue Lipitor 20 mg daily at bedtime.  Side effects and precautions discussed with him.  He had blood work with PCP last week, will send over copy.  4)  Weight/Diet:  his Body mass index is 40.93 kg/m.  -  clearly complicating his diabetes care.   he is a candidate for weight loss. I discussed with him the fact that loss of 5 - 10% of his  current body weight will have the most impact on his diabetes management.  Exercise, and detailed carbohydrates information provided  -  detailed on discharge instructions.  5) Chronic Care/Health Maintenance: -he is on ACEI/ARB and Statin medications and is encouraged to initiate and continue to follow up with Ophthalmology, Dentist, Podiatrist at least yearly or according to recommendations, and advised to stay away from smoking. I have recommended yearly flu vaccine and pneumonia vaccine at least every 5 years; moderate intensity exercise for up to 150 minutes weekly; and sleep for at least 7 hours a day.  - he is advised to maintain close follow up with Smith Robert, MD for primary care needs, as well as his other providers for optimal and coordinated care.      I spent  36  minutes in the care of the patient today including review of labs from CMP, Lipids, Thyroid Function, Hematology (current and previous including abstractions from other facilities); face-to-face time discussing  his blood glucose readings/logs, discussing hypoglycemia and hyperglycemia episodes and symptoms, medications doses, his options of short and long term treatment based on the latest standards of care / guidelines;  discussion about incorporating  lifestyle medicine;  and documenting the encounter. Risk reduction counseling performed per USPSTF guidelines to reduce obesity and cardiovascular risk factors.     Please refer to Patient Instructions for Blood Glucose Monitoring and Insulin/Medications Dosing Guide"  in media tab for additional information. Please  also refer to " Patient Self Inventory" in the Media  tab for reviewed elements of pertinent patient history.  [name] Gregory participated in the discussions, expressed understanding, and voiced agreement with the above plans.  All questions were answered to his satisfaction. he is encouraged to contact clinic should he have any questions or concerns prior to his return visit.     Follow up plan: - Return in about 3 months (around 05/23/2023) for Diabetes F/U with A1c in office, No previsit labs, Bring meter and logs.    Ronny Bacon, [facility] [address] [place], [place] [address] Phone: [phone] Fax: [phone]  02/20/2023, 11:33 AM

## 2023-02-26 ENCOUNTER — Encounter: Payer: Self-pay | Admitting: Sports Medicine

## 2023-02-27 NOTE — Telephone Encounter (Signed)
Called pt insurance and left vm with Lurena Joiner Deal to see if requires auth. Pending call back

## 2023-03-07 ENCOUNTER — Telehealth: Payer: Self-pay | Admitting: *Deleted

## 2023-03-07 NOTE — Telephone Encounter (Signed)
I am not sure who sent in the prescription for the Ozempic 0.5 mg SQ weekly for him (perhaps another provider?).  I have documentation showing he came to my office already on the 1 mg dose and all I did was increase to the 2 mg over time as clinically appropriate.   I never told him to take 0.5 mg 4 times a week to equal the 2 mg dose.  The patient did share with me that he had gone several weeks without his Ozempic (while waiting on a PA) and so when I sent in the Ozempic 2 mg, I did instruct him to listen to the clicks to give less for a period of time to prevent any unpleasant side effects from starting that full 2 mg dose again.

## 2023-03-07 NOTE — Telephone Encounter (Signed)
Greig Castilla from [facility] called and left the following message on 03/06/23. Patient had just picked up his prescription for the Ozempic ..5 mg 2 days before the new prescription was sent in. The patient administered 4 injections to make 2 mg of this in 1 week. Greig Castilla is requesting a new prescription with a note that reads patient was instructed to inject 4 (four) of the 0.5 mg for 1 week, then patient is to start Ozempic 2 mg, injecting weekly.

## 2023-03-08 ENCOUNTER — Other Ambulatory Visit: Payer: Self-pay | Admitting: Internal Medicine

## 2023-03-08 NOTE — Telephone Encounter (Signed)
I called [name] Gregory and I talked with Greig Castilla, shared Whitney's message with him.

## 2023-03-08 NOTE — Telephone Encounter (Signed)
I have called and talked to Greig Castilla at [facility]

## 2023-03-23 NOTE — Progress Notes (Unsigned)
PCP: Smith Robert, MD (Inactive) Primary Cardiologist: Debbe Odea, MD (last seen 05/24) HF provider: Arvilla Meres, MD (last seen  HPI:  [name] Gregory is a 52 y.o. male with a history of HTN, T2DM, tobacco abuse, fatty liver, lymphedema, obesity, OSA (on CPAP) and HFrEF.  Has multiple family members with HF on his father's side. Developed HF symptoms in Jan/Feb 2023. He had a prior TTE in 2020 that showed moderate concentric hypertrophy with normal EF. He was seen by Dr. Jaymes Graff at [facility] 11/03/21 nd underwent echo and stress test. 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 LVEDP was elevated at . Echo same day showed EF 20-25%, LV dilation, hypokinesis of the apical anterior, apical, apical inferior and apical septal segments, and grade III diastolic dysfunction. He also complained that his heart was fluttering, lasts for several seconds. Given this a Ziopatch was placed which was unrevealing. (A several brief runs SVT. 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   Echo 8/23 @ [facility] EF 55%  cMRI: 11/23: EF 52% RV normal. No LGE.  RHC on 06/28/22:  RHC with low filling pressures and high CO.  RA = 3 RV = 35/7 PA = 36/19 (24) PCW = 11 Fick cardiac output/index = 9.0/3.3 PVR = 1.4 WU FA sat = 96% PA sat = 69%, 71% SVC sat = 78%  Returns today for HF f/u with a chief complaint of moderate SOB with minimal exertion. Chronic in nature. Has associated dizziness at times with sudden position changes, off balance (have had ears checked), fatigue and pedal edema along with this. Due to the sense of being off balance he has had a few falls in the last year. Is wearing his compression pumps 2 hours daily with some  improvement of edema. Wearing compression socks daily. Denies chest pain, palpitations, cough, difficulty sleeping or weight gain.   Cardiology had to increase his torsemide back to 60mg  BID due to symptoms. Is getting lab work done in a few days.   CPX testing completed 12/27/22: Markedly submaximal exercise testing with peak RER (0.74) not rising above resting levels. Patient only exercised 2:45 on modified Naughton protocol. Very little can be gleaned from this test but based on existing data patient appears limited by his HF, deconditioning and obesity. Would recommend exercise training program to build stamina and then retest.   ROS: All systems negative except as listed in HPI, PMH and Problem List.  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  Tobacco Use   Smoking status: Former    Current packs/day: 0.00    Average packs/day: 0.3 packs/day for 36.0 years (9.0 ttl pk-yrs)    Types: Cigarettes    Start date: 11/18/1985    Quit date: 11/18/2021    Years since quitting: 1.3   Smokeless tobacco: Never   Tobacco comments:    QUIT SMOKING IN APRIL  Vaping Use   Vaping status: Every Day  Substance and Sexual Activity   Alcohol use: Not Currently    Comment: maybe  once a month   Drug use: No   Sexual activity: Not on file  Other Topics Concern   Not on file  Social History Narrative   Not on file   Social Determinants of Health   Financial Resource Strain: Unknown (07/17/2019)   Overall Financial Resource Strain (CARDIA)    Difficulty of Paying Living Expenses: Patient declined  Food Insecurity: Unknown (07/17/2019)   Hunger Vital Sign    Worried About Running Out of Food in the Last Year: Patient declined    Ran Out of Food in the Last Year: Patient  declined  Transportation Needs: Unknown (07/17/2019)   PRAPARE - Administrator, Civil Service (Medical): Patient declined    Lack of Transportation (Non-Medical): Patient declined  Physical Activity: Unknown (07/17/2019)   Exercise Vital Sign    Days of Exercise per Week: Patient declined    Minutes of Exercise per Session: Patient declined  Stress: Not on file  Social Connections: Unknown (07/17/2019)   Social Connection and Isolation Panel [NHANES]    Frequency of Communication with Friends and Family: Patient declined    Frequency of Social Gatherings with Friends and Family: Patient declined    Attends Religious Services: Patient declined    Database administrator or Organizations: Patient declined    Attends Banker Meetings: Patient declined    Marital Status: Patient declined  Intimate Partner Violence: Unknown (07/17/2019)   Humiliation, Afraid, Rape, and Kick questionnaire    Fear of Current or Ex-Partner: Patient declined    Emotionally Abused: Patient declined    Physically Abused: Patient declined    Sexually Abused: Patient declined    FH:  Family History  Problem Relation Age of Onset   Hypertension Mother    CVA Father    Diabetes Father    Throat cancer Father    Heart disease Brother 29   Cervical cancer Maternal Grandmother    Throat cancer Maternal Grandmother    Throat cancer Maternal Grandfather    Bladder Cancer Maternal Grandfather    Cirrhosis Maternal Grandfather    Colon cancer Neg Hx     Past Medical History:  Diagnosis Date   Carpal tunnel syndrome    CHF (congestive heart failure) (HCC)    Complication of anesthesia    WOKE UP DURING SURGERY   Diabetes (HCC)    Fatty liver    GERD (gastroesophageal reflux disease)    Hemangioma of liver    Hypertension    Rotator cuff disorder    Sleep apnea    URI (upper respiratory infection)     Current Outpatient Medications  Medication Sig Dispense Refill   albuterol  (VENTOLIN HFA) 108 (90 Base) MCG/ACT inhaler Inhale 2 puffs into the lungs every 6 (six) hours as needed for wheezing or shortness of breath. 8 g 2   ARIPiprazole (ABILIFY) 2 MG tablet Take 2 mg by mouth daily.     atorvastatin (LIPITOR) 20 MG tablet Take 1 tablet (20 mg total) by mouth daily. 90 tablet 3   buPROPion (ZYBAN) 150 MG 12 hr tablet Take 150 mg by mouth 2 (two) times daily.     carvedilol (COREG) 12.5 MG tablet Take 1 tablet (12.5 mg total) by mouth 2 (two) times daily with a meal. 180 tablet 3   citalopram (CELEXA) 20 MG tablet Take 1 tablet by mouth daily.     dicyclomine (BENTYL) 10 MG capsule Take 10 mg by mouth in the morning and at bedtime.  ENTRESTO 97-103 MG TAKE ONE TABLET BY MOUTH TWICE DAILY 60 tablet 6   gabapentin (NEURONTIN) 300 MG capsule Take 300 mg by mouth 2 (two) times daily. Take 1 capsule  in AM and 1 capsule q PM and 2 QHS for nerve pain     glipiZIDE (GLUCOTROL XL) 5 MG 24 hr tablet Take 1 tablet (5 mg total) by mouth daily with breakfast. 90 tablet 1   JARDIANCE 10 MG TABS tablet Take 1 tablet (10 mg total) by mouth daily. 90 tablet 3   LORazepam (ATIVAN) 0.5 MG tablet Take 0.5 mg by mouth every 8 (eight) hours as needed for anxiety.      magnesium oxide (MAG-OX) 400 (240 Mg) MG tablet Take 400 mg by mouth daily.     meloxicam (MOBIC) 15 MG tablet Take 1 tablet (15 mg total) by mouth daily. 30 tablet 1   metFORMIN (GLUCOPHAGE) 1000 MG tablet Take 1 tablet (1,000 mg total) by mouth 2 (two) times daily with a meal. 180 tablet 3   omeprazole (PRILOSEC) 20 MG capsule Take 20 mg by mouth 2 (two) times daily before a meal.     Semaglutide, 2 MG/DOSE, 8 MG/3ML SOPN Inject 2 mg as directed once a week. 3 mL 3   spironolactone (ALDACTONE) 25 MG tablet Take 12.5 mg by mouth daily.     torsemide 60 MG TABS Take 60 mg by mouth 2 (two) times daily. 180 tablet 3   Vitamin D, Ergocalciferol, (DRISDOL) 50000 units CAPS capsule Take 1 capsule by mouth 3 (three) times a week.      No current facility-administered medications for this visit.    There were no vitals filed for this visit.  Wt Readings from Last 3 Encounters:  02/20/23 (!) 318 lb 12.8 oz (144.6 kg)  01/22/23 (!) 321 lb (145.6 kg)  01/12/23 (!) 323 lb 12.8 oz (146.9 kg)   Lab Results  Component Value Date   CREATININE 0.94 02/13/2023   CREATININE 1.25 (H) 01/01/2023   CREATININE 1.34 (H) 12/07/2022   PHYSICAL EXAM:  General:  Well appearing. No resp difficulty HEENT: normal Neck: supple. JVP flat. No lymphadenopathy or thryomegaly appreciated. Cor: PMI normal. Regular rate & rhythm. No rubs, gallops or murmurs. Lungs: clear Abdomen: soft, nontender, nondistended. No hepatosplenomegaly. No bruits or masses.  Extremities: no cyanosis, clubbing, rash, trace pitting edema Neuro: alert & orientedx3, cranial nerves grossly intact. Moves all 4 extremities w/o difficulty. Affect pleasant.   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 Referred to genetics for further discussion - SPEP/IFE showed no monoclonal protein - NYHA class III - euvolemic - weight down 1 pound from last visit here 6 weeks ago - Echo 4/24 EF 20-25% - Cath 4/23 non-obstructive CAD - Echo 8/23 [facility]) with recovered LVEF 55%, LVIDd ~5.5, normal RV, RVSP ~29 - cMRI: 11/23: EF 52% RV normal. No LGE. - RHC 11/23  RA 3 PA 36/19 (24) PCW 11 Fick CO/CI 9.0/3.3 PA sat = 69%, 71% SVC sat = 78% - RHC with elevated CO but no evidence of shunt of previous ab CT with contrast or cMRI - continue torsemide 60mg   BID; cardiology increased it back due to worsening  symptoms when it was decreased to 60AM/ 40PM; has lab work in a few days - Continue carvedilol 25 bid  - Continue Jardiance 10mg  daily - Continue Entresto 97/103 bid  - Continue Spiro 12.5 daily - Has seen [facility] - continue lymphedema pumps & compression socks - Cardiac symptoms remain out of proportion to objective findings.  - CPX 12/27/22: Markedly submaximal exercise testing with peak RER (0.74) not rising above resting levels. Patient only exercised 2:45 on modified Naughton protocol. Very little can be gleaned from this test but based on existing data patient appears limited by his HF, deconditioning and obesity. Would recommend exercise training program to build stamina and then retest.  - pulmonary rehab referral placed today - BNP 09/10/22 was 12.7  2: Essential hypertension- - BP 91/56 - followed by PCP - BMP 01/01/23 showed sodium 133, potassium 4.3, creatinine 1.25 & GFR >60  3: DM2- - Followed by PCP - continue jardiance 10mg  daily - A1c 10/10/22 was 6.8%  4: Palpitations- - Zio Apr 2023, no significant arrhythmias, rare ventricular ectopy  5: Morbid obesity- - Body mass index is 41.41 kg/m. - continue ozempic  6: Chronic Hypoxic Respiratory Failure- - Per patient, was dropping O2 sat with exertion and qualified for home O2 - Unclear why he's requiring O2. Has been evaluted by Pulmonology (Dr. Vassie Loll) 12/23; returns in 2 days - CPX 12/27/22: Markedly submaximal exercise testing with peak RER (0.74) not rising above resting levels. Patient only exercised 2:45 on modified Naughton protocol. Very little can be gleaned from this test but based on existing data patient appears limited by his HF, deconditioning and obesity. Would recommend exercise training program to build stamina and then retest.  - pulmonary rehab referral placed today; results of CPX testing given to patient  Return in 6 weeks, sooner if needed.

## 2023-03-24 NOTE — Progress Notes (Unsigned)
MRN : [id number]  [name] Gregory is a 52 y.o. ([birth year]) male who presents with chief complaint of legs swell.  History of Present Illness:   The patient returns to the office for followup evaluation regarding leg swelling.  The swelling has persisted but with the lymph pump is under much, much better controlled. The pain associated with swelling is decreased. There have not been any interval development of a ulcerations or wounds.  The patient denies problems with the pump, noting it is working well and the leggings are in good condition.  Since the previous visit the patient has been wearing graduated compression stockings and using the lymph pump on a routine basis and  has noted significant improvement in the lymphedema.   Patient stated the lymph pump has been helpful with the treatment of the lymphedema.   Previous noninvasive study showed no evidence of superficial venous reflux or DVT.   Previous noninvasive studies show no evidence of significant carotid disease.  His bilateral vertebral arteries have antegrade flow.  Normal flow hemodynamics in the bilateral subclavian arteries.   Previous ABI of 1.19 on the right and 1.10 on the left.  He has triphasic tibial artery waveforms bilaterally with good toe waveforms bilaterally.  No outpatient medications have been marked as taking for the 03/26/23 encounter (Appointment) with Gilda Crease, Latina Craver, MD.    Past Medical History:  Diagnosis Date   Carpal tunnel syndrome    CHF (congestive heart failure) (HCC)    Complication of anesthesia    WOKE UP DURING SURGERY   Diabetes (HCC)    Fatty liver    GERD (gastroesophageal reflux disease)    Hemangioma of liver    Hypertension    Rotator cuff disorder    Sleep apnea    URI (upper respiratory infection)     Past Surgical History:  Procedure Laterality Date   BIOPSY  05/04/2016   Procedure: BIOPSY;  Surgeon: Corbin Ade, MD;  Location: [facility];  Service: Endoscopy;;  gastric bx   CARDIAC CATHETERIZATION Bilateral 10/2021   COLONOSCOPY WITH PROPOFOL N/A 05/04/2016    PROPOFOL;  Surgeon: Corbin Ade, MD; diverticulosis throughout the entire colon, otherwise normal.  Repeat due in 2027.   ESOPHAGOGASTRODUODENOSCOPY (EGD) WITH PROPOFOL N/A 05/04/2016   PROPOFOL;  Surgeon: Corbin Ade, MD; LA grade B esophagitis, gastritis, small hiatal hernia.   EYE SURGERY     RIGHT HEART CATH Right 06/28/2022   Procedure: RIGHT HEART CATH;  Surgeon: Dolores Patty, MD;  Location: [facility] INVASIVE CV LAB;  Service: Cardiovascular;  Laterality: Right;   SALIVARY GLAND SURGERY  1992    Social History Social History   Tobacco Use   Smoking status: Former    Current packs/day: 0.00    Average packs/day: 0.3 packs/day for 36.0 years (9.0 ttl pk-yrs)    Types: Cigarettes    Start date: 11/18/1985    Quit date: 11/18/2021    Years since quitting: 1.3   Smokeless tobacco: Never   Tobacco comments:    QUIT SMOKING IN APRIL  Vaping Use   Vaping status: Every Day  Substance Use Topics   Alcohol use: Not Currently    Comment: maybe once a month   Drug use: No    Family History Family History  Problem Relation Age of  Onset   Hypertension Mother    CVA Father    Diabetes Father    Throat cancer Father    Heart disease Brother 69   Cervical cancer Maternal Grandmother    Throat cancer Maternal Grandmother    Throat cancer Maternal Grandfather    Bladder Cancer Maternal Grandfather    Cirrhosis Maternal Grandfather    Colon cancer Neg Hx     No Known Allergies   REVIEW OF SYSTEMS (Negative unless checked)  Constitutional: [] Weight loss  [] Fever  [] Chills Cardiac: [] Chest pain   [] Chest pressure   [] Palpitations   [] Shortness of breath when laying flat   [] Shortness of breath with exertion. Vascular:  [] Pain in legs with walking   [x] Pain in legs with standing  [] History of DVT   [] Phlebitis   [x] Swelling in legs   [] Varicose  veins   [] Non-healing ulcers Pulmonary:   [x] Uses home oxygen   [] Productive cough   [] Hemoptysis   [] Wheeze  [] COPD   [] Asthma Neurologic:  [] Dizziness   [] Seizures   [] History of stroke   [] History of TIA  [] Aphasia   [] Vissual changes   [] Weakness or numbness in arm   [] Weakness or numbness in leg Musculoskeletal:   [] Joint swelling   [] Joint pain   [] Low back pain Hematologic:  [] Easy bruising  [] Easy bleeding   [] Hypercoagulable state   [] Anemic Gastrointestinal:  [] Diarrhea   [] Vomiting  [x] Gastroesophageal reflux/heartburn   [] Difficulty swallowing. Genitourinary:  [] Chronic kidney disease   [] Difficult urination  [] Frequent urination   [] Blood in urine Skin:  [] Rashes   [] Ulcers  Psychological:  [] History of anxiety   []  History of major depression.  Physical Examination  There were no vitals filed for this visit. There is no height or weight on file to calculate BMI. Gen: WD/WN, NAD using O2 by [place]: [place]ly/AT, No temporalis wasting.  Ear/Nose/Throat: Hearing grossly intact, nares w/o erythema or drainage, pinna without lesions Eyes: PER, EOMI, sclera nonicteric.  Neck: Supple, no gross masses.  No JVD.  Pulmonary:  Good air movement, no audible wheezing, no use of accessory muscles.  Cardiac: RRR, precordium not hyperdynamic. Vascular:  scattered varicosities present bilaterally.   2+ soft pitting edema, CEAP [id number]  Vessel Right Left  Radial Palpable Palpable  Gastrointestinal: soft, non-distended. No guarding/no peritoneal signs.  Musculoskeletal: M/S 5/5 throughout.  No deformity.  Neurologic: CN 2-12 intact. Pain and light touch intact in extremities.  Symmetrical.  Speech is fluent. Motor exam as listed above. Psychiatric: Judgment intact, Mood & affect appropriate for pt's clinical situation. Dermatologic: Venous rashes no ulcers noted.  No changes consistent with cellulitis. Lymph : No lichenification or skin changes of chronic lymphedema.  CBC Lab Results   Component Value Date   WBC 12.7 (H) 09/10/2022   HGB 12.9 (L) 09/10/2022   HCT 41.8 09/10/2022   MCV 74.8 (L) 09/10/2022   PLT 298 09/10/2022    BMET    Component Value Date/Time   NA 135 02/13/2023 1228   K 4.1 02/13/2023 1228   CL 98 02/13/2023 1228   CO2 26 02/13/2023 1228   GLUCOSE 165 (H) 02/13/2023 1228   BUN 23 (H) 02/13/2023 1228   BUN 28 (A) 12/29/2021 0000   CREATININE 0.94 02/13/2023 1228   CREATININE 0.72 03/18/2016 1352   CALCIUM 9.2 02/13/2023 1228   GFRNONAA >60 02/13/2023 1228   GFRNONAA >89 03/18/2016 1352   GFRAA >60 07/18/2019 0430   GFRAA >89 03/18/2016 1352   CrCl cannot be  calculated (Patient's most recent lab result is older than the maximum 21 days allowed.).  COAG Lab Results  Component Value Date   INR 1.0 12/16/2008    Radiology No results found.   Assessment/Plan 1. Lymphedema Recommend:  No surgery or intervention at this point in time.    I have reviewed my discussion with the patient regarding lymphedema and why it  causes symptoms.  Patient will continue wearing graduated compression on a daily basis. The patient should put the compression on first thing in the morning and removing them in the evening. The patient should not sleep in the compression.   In addition, behavioral modification throughout the day will be continued.  This will include frequent elevation (such as in a recliner), use of over the counter pain medications as needed and exercise such as walking.  The systemic causes for chronic edema such as liver, kidney and cardiac etiologies does not appear to have significant changed over the past year.    The patient will continue aggressive use of the  lymph pump.  This will continue to improve the edema control and prevent sequela such as ulcers and infections.   The patient will follow-up with me on an annual basis.   2. Primary hypertension Continue antihypertensive medications as already ordered, these medications  have been reviewed and there are no changes at this time.  3. Chronic respiratory failure with hypoxia (HCC) Continue pulmonary medications and aerosols as already ordered, these medications have been reviewed and there are no changes at this time.  Patient's notes that with the changes in his pulmonary medications he is actually doing better.  4. Gastroesophageal reflux disease, unspecified whether esophagitis present Continue PPI as already ordered, this medication has been reviewed and there are no changes at this time.  Avoidence of caffeine and alcohol  Moderate elevation of the head of the bed     Levora Dredge, MD  03/24/2023 1:53 PM

## 2023-03-26 ENCOUNTER — Encounter: Payer: Self-pay | Admitting: Family

## 2023-03-26 ENCOUNTER — Encounter (INDEPENDENT_AMBULATORY_CARE_PROVIDER_SITE_OTHER): Payer: Self-pay | Admitting: Vascular Surgery

## 2023-03-26 ENCOUNTER — Ambulatory Visit (INDEPENDENT_AMBULATORY_CARE_PROVIDER_SITE_OTHER): Payer: PRIVATE HEALTH INSURANCE | Admitting: Vascular Surgery

## 2023-03-26 ENCOUNTER — Ambulatory Visit: Payer: PRIVATE HEALTH INSURANCE | Attending: Family | Admitting: Family

## 2023-03-26 ENCOUNTER — Ambulatory Visit: Payer: PRIVATE HEALTH INSURANCE

## 2023-03-26 VITALS — BP 123/83 | HR 89 | Resp 16 | Wt 322.0 lb

## 2023-03-26 VITALS — BP 111/86 | HR 87 | Resp 16 | Wt 321.0 lb

## 2023-03-26 DIAGNOSIS — J9611 Chronic respiratory failure with hypoxia: Secondary | ICD-10-CM | POA: Diagnosis not present

## 2023-03-26 DIAGNOSIS — I10 Essential (primary) hypertension: Secondary | ICD-10-CM | POA: Diagnosis not present

## 2023-03-26 DIAGNOSIS — I5022 Chronic systolic (congestive) heart failure: Secondary | ICD-10-CM | POA: Insufficient documentation

## 2023-03-26 DIAGNOSIS — I110 Hypertensive heart disease with heart failure: Secondary | ICD-10-CM | POA: Diagnosis not present

## 2023-03-26 DIAGNOSIS — R002 Palpitations: Secondary | ICD-10-CM | POA: Diagnosis not present

## 2023-03-26 DIAGNOSIS — Z79899 Other long term (current) drug therapy: Secondary | ICD-10-CM | POA: Insufficient documentation

## 2023-03-26 DIAGNOSIS — I428 Other cardiomyopathies: Secondary | ICD-10-CM

## 2023-03-26 DIAGNOSIS — E119 Type 2 diabetes mellitus without complications: Secondary | ICD-10-CM | POA: Insufficient documentation

## 2023-03-26 DIAGNOSIS — E669 Obesity, unspecified: Secondary | ICD-10-CM | POA: Insufficient documentation

## 2023-03-26 DIAGNOSIS — K219 Gastro-esophageal reflux disease without esophagitis: Secondary | ICD-10-CM | POA: Diagnosis not present

## 2023-03-26 DIAGNOSIS — I890 Lymphedema, not elsewhere classified: Secondary | ICD-10-CM | POA: Diagnosis not present

## 2023-03-26 DIAGNOSIS — G4733 Obstructive sleep apnea (adult) (pediatric): Secondary | ICD-10-CM | POA: Diagnosis not present

## 2023-03-26 DIAGNOSIS — K760 Fatty (change of) liver, not elsewhere classified: Secondary | ICD-10-CM | POA: Insufficient documentation

## 2023-03-26 DIAGNOSIS — Z7984 Long term (current) use of oral hypoglycemic drugs: Secondary | ICD-10-CM | POA: Diagnosis not present

## 2023-03-26 DIAGNOSIS — I420 Dilated cardiomyopathy: Secondary | ICD-10-CM | POA: Insufficient documentation

## 2023-03-26 DIAGNOSIS — I2510 Atherosclerotic heart disease of native coronary artery without angina pectoris: Secondary | ICD-10-CM | POA: Diagnosis not present

## 2023-03-26 DIAGNOSIS — H9190 Unspecified hearing loss, unspecified ear: Secondary | ICD-10-CM | POA: Diagnosis not present

## 2023-03-26 DIAGNOSIS — Z794 Long term (current) use of insulin: Secondary | ICD-10-CM

## 2023-03-26 NOTE — Patient Instructions (Addendum)
Call pulmonary rehab when you get home to schedule orientation phone call

## 2023-03-27 NOTE — Progress Notes (Signed)
   03/26/23 0900  ReDS Vest / Clip  Station Marker D  Ruler Value 45  ReDS Value Range < 36  ReDS Actual Value 30

## 2023-03-29 ENCOUNTER — Encounter: Payer: Self-pay | Admitting: Pulmonary Disease

## 2023-03-29 ENCOUNTER — Ambulatory Visit: Payer: PRIVATE HEALTH INSURANCE | Admitting: Pulmonary Disease

## 2023-03-29 VITALS — BP 100/71 | HR 102 | Ht 74.0 in | Wt 321.0 lb

## 2023-03-29 DIAGNOSIS — G4733 Obstructive sleep apnea (adult) (pediatric): Secondary | ICD-10-CM

## 2023-03-29 DIAGNOSIS — J9611 Chronic respiratory failure with hypoxia: Secondary | ICD-10-CM | POA: Diagnosis not present

## 2023-03-29 NOTE — Progress Notes (Signed)
   Subjective:    Patient ID: [name] Gregory, male    DOB: [birth year], 52 y.o.   MRN: [id number]  HPI  52 yo obese smoker for FU of obstructive sleep apnea. He works as an Production designer, theatre/television/film for [employer]  and has 3 kids. DME - [employer], was on CPAP 13 cm   He smoked about a pack per day, about 25 pack years, quit 11/2021   PMH - hypertension controlled with 4 medications HFrEF - 20-25 % , LHC 11/2021 non obs CAD cMRI: 11/23: EF 52% RV normal. No LGE.  RHC 06/2022 PVR 1.4 WU, RA 3  50-month follow-up visit His EF has recovered he remains on carvedilol and Entresto.  Reviewed last cardiology follow-up. His weight is stable.  He is on Ozempic but has not lost any weight. He is losing blood somewhere and GI has scheduled endoscopy on 8/29.  He continues to be short of breath.  Reviewed cardiopulmonary stress test and he was unable to exercise for more than 3 minutes. He has enrolled in cardio pulmonary rehab at [facility] He denies any problems with CPAP mask or pressure.  He has settled down with fullface mask.  His current mask is broken and he requests a replacement  Significant tests/ events reviewed   CPX 12/27/22: Markedly submaximal exercise testing with peak RER (0.74) not rising above resting levels. Patient only exercised 2:45   03/2017 NPSG >> severe , AHI 62/h, lowest desatn 70 %   01/2022 PFTs no airway obstruction, ratio 84, FEV1 80%, FVC 77%, 10% bronchodilator response with FEV1 improved to 89% which may have been related to effort, TLC normal, DLCO 22.6/67%  Review of Systems neg for any significant sore throat, dysphagia, itching, sneezing, nasal congestion or excess/ purulent secretions, fever, chills, sweats, unintended wt loss, pleuritic or exertional cp, hempoptysis, orthopnea pnd or change in chronic leg swelling. Also denies presyncope, palpitations, heartburn, abdominal pain, nausea, vomiting, diarrhea or change in bowel or urinary habits, dysuria,hematuria, rash,  arthralgias, visual complaints, headache, numbness weakness or ataxia.     Objective:   Physical Exam  Gen. Pleasant, obese, in no distress ENT - no lesions, no post nasal drip, on O2 Neck: No JVD, no thyromegaly, no carotid bruits Lungs: no use of accessory muscles, no dullness to percussion, decreased without rales or rhonchi  Cardiovascular: Rhythm regular, heart sounds  normal, no murmurs or gallops, no peripheral edema Musculoskeletal: No deformities, no cyanosis or clubbing , no tremors        Assessment & Plan:

## 2023-03-29 NOTE — Assessment & Plan Note (Signed)
No download available today but he is very compliant by history. CPAP is only helped improve his daytime somnolence and fatigue.  We will provide him with a replacement fullface mask Weight loss encouraged, compliance with goal of at least 4-6 hrs every night is the expectation. Advised against medications with sedative side effects Cautioned against driving when sleepy - understanding that sleepiness will vary on a day to day basis

## 2023-03-29 NOTE — Patient Instructions (Signed)
X oxygen check for requalification  X Rx to [facility] for replacement fullface mask

## 2023-03-29 NOTE — Addendum Note (Signed)
Addended by: Shelby Dubin on: 03/29/2023 04:03 PM   Modules accepted: Orders

## 2023-03-29 NOTE — Assessment & Plan Note (Signed)
He desaturates to 87% on 1 lap and recovered with oxygen. Will continue to use oxygen 24/7 and blended into CPAP

## 2023-04-03 ENCOUNTER — Encounter: Payer: PRIVATE HEALTH INSURANCE | Attending: Cardiology | Admitting: *Deleted

## 2023-04-03 ENCOUNTER — Encounter: Payer: Self-pay | Admitting: *Deleted

## 2023-04-03 DIAGNOSIS — I5022 Chronic systolic (congestive) heart failure: Secondary | ICD-10-CM

## 2023-04-03 NOTE — Progress Notes (Signed)
Initial phone call completed. Diagnosis can be found in [facility] 03/26/23. EP Orientation scheduled for Wednesday 8/28 at 10am.

## 2023-04-10 ENCOUNTER — Other Ambulatory Visit: Payer: Self-pay | Admitting: Family

## 2023-04-11 ENCOUNTER — Ambulatory Visit: Payer: PRIVATE HEALTH INSURANCE

## 2023-04-17 ENCOUNTER — Ambulatory Visit: Payer: PRIVATE HEALTH INSURANCE | Admitting: Sports Medicine

## 2023-04-17 ENCOUNTER — Encounter: Payer: Self-pay | Admitting: Sports Medicine

## 2023-04-17 DIAGNOSIS — E1142 Type 2 diabetes mellitus with diabetic polyneuropathy: Secondary | ICD-10-CM

## 2023-04-17 DIAGNOSIS — G8929 Other chronic pain: Secondary | ICD-10-CM

## 2023-04-17 DIAGNOSIS — M12811 Other specific arthropathies, not elsewhere classified, right shoulder: Secondary | ICD-10-CM

## 2023-04-17 DIAGNOSIS — M25511 Pain in right shoulder: Secondary | ICD-10-CM | POA: Diagnosis not present

## 2023-04-17 DIAGNOSIS — G5621 Lesion of ulnar nerve, right upper limb: Secondary | ICD-10-CM

## 2023-04-17 DIAGNOSIS — G5601 Carpal tunnel syndrome, right upper limb: Secondary | ICD-10-CM | POA: Diagnosis not present

## 2023-04-17 MED ORDER — CELECOXIB 100 MG PO CAPS: 100.0000 mg | ORAL_CAPSULE | Freq: Two times a day (BID) | ORAL | 0 refills | Status: DC

## 2023-04-17 NOTE — Progress Notes (Signed)
[name] Gregory - 52 y.o. male MRN [id number]  Date of birth: 1970-11-18  Office Visit Note: Visit Date: 04/17/2023 PCP: Alvina Filbert, MD Referred by: Alvina Filbert, MD  Subjective: Chief Complaint  Patient presents with   Shoulder Pain    R Shoulder still painful. Still waiting on MRI, hasn't heard about it in a month or so. Patient states it is getting more difficult to hold things, and that pain is starting to go down his arm. Patient states that he cannot sleep due to pain. Patient is taking Meloxicam and Tylenol and is not going to PT.   HPI: [name] Gregory is a pleasant 52 y.o. male who presents today for acute on chronic right shoulder pain, RUE neuropathy.  Right shoulder -this continues to be bothersome for him, has been bothersome for many months.  Feels pain with certain reaching motions I will send a jolt from the shoulder and sometimes down the arm.  He did have a subacromial joint injection on 2/27 as well as a glenohumeral joint injection on 11/14/2022, the glenohumeral joint injection did help relieve his pain moderately but still his pain never completely subsided.  He does have some pain with numbness tingling over the medial elbow and into the fifth ring finger.  He does report that the pinky finger stays numb most time.  He has a history of bilateral lower extremity neuropathy.  He does continue on gabapentin 300 mg a.m., 300 mg lunch, 600 mg nightly. Taking Meloxicam 15mg  - only helping some with shoulder pain. Had formal PT script sent to [facility] in [place] to do therapy back in May.  RUE and b/l foot Neuropathy - previously saw [facility]. Had bilateral upper and lower extremity NCS/EMG, which showed:  EMG/NCS with Dr. Alvester Morin 12/12/22 - moderate to severe R median nerve entrapment.  Pertinent ROS were reviewed with the patient and found to be negative unless otherwise specified above in HPI.   Assessment & Plan: Visit Diagnoses:  1. Chronic right shoulder pain    2. Rotator cuff arthropathy of right shoulder   3. Ulnar neuropathy at elbow of right upper extremity   4. Carpal tunnel syndrome, right upper limb   5. Type 2 diabetes mellitus with diabetic polyneuropathy, unspecified whether long term insulin use (HCC)    Plan: Kanden is dealing with chronic right shoulder pain which I believe is emanating from rotator cuff arthropathy.  This has been bothering him since the beginning of the year and he has only received partial improvement from shoulder injection, home in physical therapy, oral medications.  We have attempted to obtain an MRI of the right shoulder to further evaluate, there has been some issues with scheduling this.  I discussed with him today I will talk to our referral coordinator to get to the bottom of this and have his MRI obtained so we can get in a better evaluation of the origin of his pathology.  I do think a lot of his pain is emanating from the shoulder, although he has both signs and symptoms as well as EMG/nerve conduction studies which do show ulnar neuropathy as well as moderate to severe right carpal tunnel as well which is contributing.  For his neuropathy, he will continue his gabapentin 300 mg morning and lunch as well as 600 mg nightly.  Given that he is not receiving much of a positive benefit from meloxicam, we will discontinue this and start him on Celebrex 100 mg twice daily for the  next month or so.  He will continue his shoulder rehab until his MRI.  I will message him once this returns, did discuss if I do think this is something that would benefit from surgical repair, would get him to my partner Dr. August Saucer.  Ultimately, we will need to see what the MRI of the right shoulder shows and decide to treat that versus his underlying ulnar neuropathy and carpal tunnel syndrome.  Follow-up: Return for I will call/message patient once MRI shoulder results.   Meds & Orders:  Meds ordered this encounter  Medications   celecoxib  (CELEBREX) 100 MG capsule    Sig: Take 1 capsule (100 mg total) by mouth 2 (two) times daily.    Dispense:  60 capsule    Refill:  0   No orders of the defined types were placed in this encounter.    Procedures: No procedures performed      Clinical History: No specialty comments available.  He reports that he quit smoking about 16 months ago. His smoking use included cigarettes. He started smoking about 37 years ago. He has a 9 pack-year smoking history. He has never used smokeless tobacco.  Recent Labs    07/10/22 1453 10/10/22 1115 02/20/23 1122  HGBA1C 5.9* 6.8* 6.6*    Objective:    Physical Exam  Gen: Well-appearing, in no acute distress; non-toxic CV:  Well-perfused. Warm.  Resp: Breathing unlabored on room air; no wheezing. Psych: Fluid speech in conversation; appropriate affect; normal thought process Neuro: Sensation intact throughout. No gross coordination deficits.   Ortho Exam - Right shoulder: + TTP at Codman's point.  There is no gross restriction with active or passive range of motion in all directions albeit pain at endrange abduction and external rotation.  There is pain with Hawkins impingement testing, pain with empty can testing.  There is no gross weakness with provocative rotator cuff testing, albeit guarding during examination.  There is mild bony protuberance at the distal end of the clavicle.   Imaging:  XR R-shoulder 10/10/22: 4 views of the right shoulder including AP, Grashey, scapular Y and  axillary views were ordered and reviewed by myself.  There is mild  glenohumeral joint arthritic change.  Moderate AC joint arthritis.  No  acute fracture or evidence of bony abnormality noted.   Past Medical/Family/Surgical/Social History: Medications & Allergies reviewed per EMR, new medications updated. Patient Active Problem List   Diagnosis Date Noted   Lymphedema 03/26/2023   Bilateral sensorineural hearing loss 01/04/2023   Chronic respiratory  failure with hypoxia (HCC) 01/02/2022   HTN (hypertension) 06/10/2020   Loose stools 06/09/2020   GERD (gastroesophageal reflux disease) 06/09/2020   Elevated LFTs 06/09/2020   Early satiety 06/09/2020   Fatty liver 06/09/2020   Dizziness    Hyperglycemia    Liver enzyme elevation    Nausea vomiting and diarrhea    Transaminitis    AKI (acute kidney injury) (HCC) 07/17/2019   Abdominal pain 07/17/2019   Nasal congestion 07/17/2019   Post-nasal drainage 07/17/2019   OSA on CPAP 06/14/2017   Morbid (severe) obesity due to excess calories (HCC) 06/14/2017   Abdominal pain, epigastric 04/24/2016   RUQ pain 04/24/2016   Microcytic anemia 04/24/2016   Diarrhea 04/24/2016   Elevated sed rate 04/24/2016   Splenomegaly 04/24/2016   Past Medical History:  Diagnosis Date   Carpal tunnel syndrome    CHF (congestive heart failure) (HCC)    Complication of anesthesia    WOKE UP  DURING SURGERY   Diabetes (HCC)    Fatty liver    GERD (gastroesophageal reflux disease)    Hemangioma of liver    Hypertension    Rotator cuff disorder    Sleep apnea    URI (upper respiratory infection)    Family History  Problem Relation Age of Onset   Hypertension Mother    CVA Father    Diabetes Father    Throat cancer Father    Heart disease Brother 82   Cervical cancer Maternal Grandmother    Throat cancer Maternal Grandmother    Throat cancer Maternal Grandfather    Bladder Cancer Maternal Grandfather    Cirrhosis Maternal Grandfather    Colon cancer Neg Hx    Past Surgical History:  Procedure Laterality Date   BIOPSY  05/04/2016   Procedure: BIOPSY;  Surgeon: Corbin Ade, MD;  [facility];  Service: Endoscopy;;  gastric bx   CARDIAC CATHETERIZATION Bilateral 10/2021   COLONOSCOPY WITH PROPOFOL N/A 05/04/2016    PROPOFOL;  Surgeon: Corbin Ade, MD; diverticulosis throughout the entire colon, otherwise normal.  Repeat due in 2027.   ESOPHAGOGASTRODUODENOSCOPY (EGD) WITH  PROPOFOL N/A 05/04/2016   PROPOFOL;  Surgeon: Corbin Ade, MD; LA grade B esophagitis, gastritis, small hiatal hernia.   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

## 2023-04-19 ENCOUNTER — Encounter: Payer: Self-pay | Admitting: Sports Medicine

## 2023-04-19 ENCOUNTER — Ambulatory Visit: Payer: PRIVATE HEALTH INSURANCE | Admitting: Cardiology

## 2023-05-07 ENCOUNTER — Other Ambulatory Visit: Payer: Self-pay | Admitting: Nurse Practitioner

## 2023-05-07 ENCOUNTER — Other Ambulatory Visit: Payer: Self-pay | Admitting: Sports Medicine

## 2023-05-09 ENCOUNTER — Other Ambulatory Visit: Payer: Self-pay | Admitting: Sports Medicine

## 2023-05-09 ENCOUNTER — Telehealth: Payer: Self-pay | Admitting: Sports Medicine

## 2023-05-09 MED ORDER — CELECOXIB 100 MG PO CAPS: 100.0000 mg | ORAL_CAPSULE | Freq: Two times a day (BID) | ORAL | 0 refills | Status: AC

## 2023-05-09 NOTE — Telephone Encounter (Signed)
Received call from [facility] -Pharmacist with [facility] needing Rx refilled for the patient (Celebrex)  The number to contact Efraim Kaufmann is [phone]

## 2023-05-10 ENCOUNTER — Other Ambulatory Visit: Payer: Self-pay | Admitting: *Deleted

## 2023-05-10 ENCOUNTER — Ambulatory Visit: Payer: PRIVATE HEALTH INSURANCE | Attending: Cardiology | Admitting: Cardiology

## 2023-05-10 MED ORDER — TORSEMIDE 20 MG PO TABS: 60.0000 mg | ORAL_TABLET | Freq: Two times a day (BID) | ORAL | 0 refills | Status: DC

## 2023-05-11 ENCOUNTER — Encounter: Payer: Self-pay | Admitting: Cardiology

## 2023-05-12 ENCOUNTER — Ambulatory Visit
Admission: RE | Admit: 2023-05-12 | Discharge: 2023-05-12 | Disposition: A | Discharge status: Home / Self Care | Payer: PRIVATE HEALTH INSURANCE | Source: Ambulatory Visit | Attending: Sports Medicine | Admitting: Sports Medicine

## 2023-05-12 DIAGNOSIS — G8929 Other chronic pain: Secondary | ICD-10-CM

## 2023-05-23 ENCOUNTER — Ambulatory Visit: Payer: PRIVATE HEALTH INSURANCE | Admitting: Nurse Practitioner

## 2023-05-23 DIAGNOSIS — Z7984 Long term (current) use of oral hypoglycemic drugs: Secondary | ICD-10-CM

## 2023-05-23 DIAGNOSIS — Z7985 Long-term (current) use of injectable non-insulin antidiabetic drugs: Secondary | ICD-10-CM

## 2023-05-23 DIAGNOSIS — E119 Type 2 diabetes mellitus without complications: Secondary | ICD-10-CM

## 2023-05-24 ENCOUNTER — Other Ambulatory Visit: Payer: Self-pay | Admitting: Sports Medicine

## 2023-05-24 DIAGNOSIS — G8929 Other chronic pain: Secondary | ICD-10-CM

## 2023-05-24 DIAGNOSIS — M12811 Other specific arthropathies, not elsewhere classified, right shoulder: Secondary | ICD-10-CM

## 2023-05-26 NOTE — Progress Notes (Signed)
Thanks Annabelle Harman.  We will check him out.  Looks like he has a lot of potential pain generators in the shoulder.

## 2023-05-31 ENCOUNTER — Other Ambulatory Visit: Payer: Self-pay | Admitting: Sports Medicine

## 2023-05-31 ENCOUNTER — Other Ambulatory Visit: Payer: Self-pay | Admitting: *Deleted

## 2023-05-31 MED ORDER — TORSEMIDE 20 MG PO TABS: 60.0000 mg | ORAL_TABLET | Freq: Two times a day (BID) | ORAL | 0 refills | Status: DC

## 2023-06-14 ENCOUNTER — Other Ambulatory Visit: Payer: Self-pay | Admitting: Family

## 2023-06-20 ENCOUNTER — Other Ambulatory Visit (INDEPENDENT_AMBULATORY_CARE_PROVIDER_SITE_OTHER): Payer: Self-pay

## 2023-06-20 ENCOUNTER — Ambulatory Visit: Payer: PRIVATE HEALTH INSURANCE | Admitting: Orthopedic Surgery

## 2023-06-20 DIAGNOSIS — M25512 Pain in left shoulder: Secondary | ICD-10-CM | POA: Diagnosis not present

## 2023-06-23 ENCOUNTER — Encounter: Payer: Self-pay | Admitting: Orthopedic Surgery

## 2023-06-23 NOTE — Progress Notes (Signed)
Office Visit Note   Patient: [name] Gregory           Date of Birth: 01-07-1971           MRN: [id number] Visit Date: 06/20/2023 Requested by: Madelyn Brunner, DO [address] [place],  [place] [address] PCP: Alvina Filbert, MD  Subjective: Chief Complaint  Patient presents with   Right Shoulder - Pain   Left Shoulder - Pain    HPI: [name] Gregory is a 52 y.o. male who presents to the office reporting a complex history of right and left shoulder pain of long duration.  Patient describes pain in the right shoulder and to a lesser degree in the left shoulder for the past 8 months.  Has a history of remote fall but is not sure if this really caused the issues.  He is right-hand dominant.  The pain wakes him from sleep at night.  Cannot really lay on the right-hand side.  Does report radicular pain with numbness and tingling but he also has a history of neuropathy.  Patient also reports some lateral sided neck pain on the left and right-hand side.  He really describes only pain in the shoulder and arm but no mechanical symptoms.  Pain is primarily superior on the right-hand side.  Does not work.  States that the symptoms are progressively worsening.  Does hurt for him to reach behind his back as well as across his body.  Pain radiates to the wrist.  He has had 2 injections in the glenohumeral joint and subacromial space without much or any relief even temporarily.  MRI scan of the right shoulder demonstrates intact rotator cuff with moderate tendinosis of the biceps long head as well as mild to moderate arthropathy of the [facility] joint and an os acromiale with a small amount of fluid through the synchondrosis.  There is also thickening of the inferior joint capsule consistent with adhesive capsulitis.  Additionally there is an 8 mm erosion involving the anterior medial humeral head with some surrounding bone marrow edema.  This is reviewed with the patient.  Notably the patient is also on O2 for CHF and  pulmonary issues.  He has genetic cardiomyopathy.  Patient does have right sided moderate carpal tunnel syndrome based on EMG nerve study from May of this year..                ROS: All systems reviewed are negative as they relate to the chief complaint within the history of present illness.  Patient denies fevers or chills.  Assessment & Plan: Visit Diagnoses:  1. Left shoulder pain, unspecified chronicity     Plan: Impression is right shoulder pain with atypical erosion in the humeral head along with findings consistent with other pain generators in the shoulder such as the [facility] joint biceps tendinosis as well as adhesive capsulitis.  Additionally I think this could be cervical spine related.  He does have C5-6 degenerative disc disease on radiographs from earlier this year.  Patient is very sensitive to examination and manipulation of the shoulder which is a somewhat negative prognostic factor for surgical success.  Additionally he describes 0 relief from 2 shoulder injections.  Patient is having similar but less severe symptoms on the left-hand side.  I am not exactly certain what that humeral head erosion represents but I would like to see if it is present on the left-hand side.  As far as the right shoulder goes I think he may be  heading for intervention but based on the amount of discomfort he described . with even the examination I think rehabilitation after any shoulder surgery for [place] would be  exceedingly difficult  Another consideration for And would be radiculopathy versus median nerve compression at the wrist as an additional source of pain.  I would exhaust all these avenues of diagnostic investigation prior to surgical intervention in the shoulder..  Follow-Up Instructions: No follow-ups on file.   Orders:  Orders Placed This Encounter  Procedures   XR Shoulder Left   MR Shoulder Left w/ contrast   Arthrogram   No orders of the defined types were placed in this  encounter.     Procedures: No procedures performed   Clinical Data: No additional findings.  Objective: Vital Signs: There were no vitals taken for this visit.  Physical Exam:  Constitutional: Patient appears well-developed HEENT:  Head: Normocephalic Eyes:EOM are normal Neck: Normal range of motion Cardiovascular: Normal rate Pulmonary/chest: Effort normal Neurologic: Patient is alert Skin: Skin is warm Psychiatric: Patient has normal mood and affect  Ortho Exam: Ortho exam demonstrates range of motion on the right at 15/80/110.  Range of motion on the left is 30/95/150.  Does have some tenderness to the [facility] joint to direct palpation.  Difficult to say if that os acromiale is also tender.  Rotator cuff strength is intact bilaterally to infraspinatus supraspinatus and subscap muscle testing.  Any manipulation of that right shoulder is very painful for Caryn Bee.  He does have positive carpal tunnel compression testing more on the right than the left but no subluxation or tenderness of the ulnar nerve in the cubital tunnel. Cervical spine range of motion intact with no real reproduction of symptoms with movement of the head.  He does have good rotator cuff strength and no coarseness or grinding with passive range of motion of either shoulder.  Specialty Comments:  No specialty comments available.  Imaging: No results found.   PMFS History: Patient Active Problem List   Diagnosis Date Noted   Lymphedema 03/26/2023   Bilateral sensorineural hearing loss 01/04/2023   Chronic respiratory failure with hypoxia (HCC) 01/02/2022   HTN (hypertension) 06/10/2020   Loose stools 06/09/2020   GERD (gastroesophageal reflux disease) 06/09/2020   Elevated LFTs 06/09/2020   Early satiety 06/09/2020   Fatty liver 06/09/2020   Dizziness    Hyperglycemia    Liver enzyme elevation    Nausea vomiting and diarrhea    Transaminitis    AKI (acute kidney injury) (HCC) 07/17/2019   Abdominal  pain 07/17/2019   Nasal congestion 07/17/2019   Post-nasal drainage 07/17/2019   OSA on CPAP 06/14/2017   Morbid (severe) obesity due to excess calories (HCC) 06/14/2017   Abdominal pain, epigastric 04/24/2016   RUQ pain 04/24/2016   Microcytic anemia 04/24/2016   Diarrhea 04/24/2016   Elevated sed rate 04/24/2016   Splenomegaly 04/24/2016   Past Medical History:  Diagnosis Date   Carpal tunnel syndrome    CHF (congestive heart failure) (HCC)    Complication of anesthesia    WOKE UP DURING SURGERY   Diabetes (HCC)    Fatty liver    GERD (gastroesophageal reflux disease)    Hemangioma of liver    Hypertension    Rotator cuff disorder    Sleep apnea    URI (upper respiratory infection)     Family History  Problem Relation Age of Onset   Hypertension Mother    CVA Father    Diabetes Father  Throat cancer Father    Heart disease Brother 29   Cervical cancer Maternal Grandmother    Throat cancer Maternal Grandmother    Throat cancer Maternal Grandfather    Bladder Cancer Maternal Grandfather    Cirrhosis Maternal Grandfather    Colon cancer Neg Hx     Past Surgical History:  Procedure Laterality Date   BIOPSY  05/04/2016   Procedure: BIOPSY;  Surgeon: Corbin Ade, MD;  [facility];  Service: Endoscopy;;  gastric bx   CARDIAC CATHETERIZATION Bilateral 10/2021   COLONOSCOPY WITH PROPOFOL N/A 05/04/2016    PROPOFOL;  Surgeon: Corbin Ade, MD; diverticulosis throughout the entire colon, otherwise normal.  Repeat due in 2027.   ESOPHAGOGASTRODUODENOSCOPY (EGD) WITH PROPOFOL N/A 05/04/2016   PROPOFOL;  Surgeon: Corbin Ade, MD; LA grade B esophagitis, gastritis, small hiatal hernia.   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

## 2023-06-26 ENCOUNTER — Ambulatory Visit: Payer: PRIVATE HEALTH INSURANCE | Attending: Cardiology | Admitting: Cardiology

## 2023-06-26 ENCOUNTER — Encounter: Payer: Self-pay | Admitting: Cardiology

## 2023-06-26 VITALS — BP 84/64 | HR 94 | Ht 74.0 in | Wt 316.6 lb

## 2023-06-26 DIAGNOSIS — I428 Other cardiomyopathies: Secondary | ICD-10-CM | POA: Diagnosis not present

## 2023-06-26 DIAGNOSIS — I10 Essential (primary) hypertension: Secondary | ICD-10-CM

## 2023-06-26 MED ORDER — SPIRONOLACTONE 25 MG PO TABS: 12.5000 mg | ORAL_TABLET | Freq: Every day | ORAL | Status: DC

## 2023-06-26 MED ORDER — ENTRESTO 97-103 MG PO TABS: ORAL_TABLET | ORAL | 6 refills | Status: DC

## 2023-06-26 NOTE — Patient Instructions (Signed)
Medication Instructions:   DECREASE Entresto - Taker 1/2 tablet by mouth twice a day.   *If you need a refill on your cardiac medications before your next appointment, please call your pharmacy*   Lab Work:  Your physician recommends you have labs - BMP / Lipid  If you have labs (blood work) drawn today and your tests are completely normal, you will receive your results only by: MyChart Message (if you have MyChart) OR A paper copy in the mail If you have any lab test that is abnormal or we need to change your treatment, we will call you to review the results.   Testing/Procedures:  None Ordered   Follow-Up: At [facility], you and your health needs are our priority.  As part of our continuing mission to provide you with exceptional heart care, we have created designated Provider Care Teams.  These Care Teams include your primary Cardiologist (physician) and Advanced Practice Providers (APPs -  Physician Assistants and Nurse Practitioners) who all work together to provide you with the care you need, when you need it.  We recommend signing up for the patient portal called "MyChart".  Sign up information is provided on this After Visit Summary.  MyChart is used to connect with patients for Virtual Visits (Telemedicine).  Patients are able to view lab/test results, encounter notes, upcoming appointments, etc.  Non-urgent messages can be sent to your provider as well.   To learn more about what you can do with MyChart, go to [email or web address].    Your next appointment:   3 month(s)  Provider:   You may see Debbe Odea, MD or one of the following Advanced Practice Providers on your designated Care Team:   Nicolasa Ducking, NP Eula Listen, PA-C Cadence Fransico Michael, PA-C Charlsie Quest, NP Carlos Levering, NP

## 2023-06-26 NOTE — Progress Notes (Signed)
Cardiology Office Note:    Date:  06/26/2023   ID:  [name] Gregory, DOB 27-Feb-1971, MRN [id number]  PCP:  Alvina Filbert, MD   [facility] Providers Cardiologist:  Debbe Odea, MD     Referring MD: Smith Robert, MD   Chief Complaint  Patient presents with   Follow-up    Patient reports increasing fatigue and not "feeling like himself".  Would also like to discuss starting Lyrica recommended by PCP.  BP low on office check today, 84/64.  Patient reports bp usually runs low but not this low.    History of Present Illness:    [name] Gregory is a 52 y.o. male with a hx of NICM (initial EF 25%, last EF 50%), hypertension, diabetes, OSA/CPAP, former smoker who presents to for follow-up.   Previously seen with abdominal distention and edema, torsemide was increased to 60 mg twice daily.  He states his edema is much improved, although he complains of fatigue.  Does not check his blood pressure frequently at home, BP this morning with systolics in 80s.  Compliant with medications as prescribed.   Prior notes CMR 11/23 EF 52% Echo 7/23 EF 45 to 50% Outside records/reports from [facility] Echo 11/18/2020 EF 20 to 25% Left heart cath 11/18/2021 no evidence for CAD Brother has history of CHF, died at age 69  Past Medical History:  Diagnosis Date   Carpal tunnel syndrome    CHF (congestive heart failure) (HCC)    Complication of anesthesia    WOKE UP DURING SURGERY   Diabetes (HCC)    Fatty liver    GERD (gastroesophageal reflux disease)    Hemangioma of liver    Hypertension    Rotator cuff disorder    Sleep apnea    URI (upper respiratory infection)     Past Surgical History:  Procedure Laterality Date   BIOPSY  05/04/2016   Procedure: BIOPSY;  Surgeon: Corbin Ade, MD;  Location: [facility];  Service: Endoscopy;;  gastric bx   CARDIAC CATHETERIZATION Bilateral 10/2021   COLONOSCOPY WITH PROPOFOL N/A 05/04/2016    PROPOFOL;  Surgeon: Corbin Ade, MD;  diverticulosis throughout the entire colon, otherwise normal.  Repeat due in 2027.   ESOPHAGOGASTRODUODENOSCOPY (EGD) WITH PROPOFOL N/A 05/04/2016   PROPOFOL;  Surgeon: Corbin Ade, MD; LA grade B esophagitis, gastritis, small hiatal hernia.   EYE SURGERY     RIGHT HEART CATH Right 06/28/2022   Procedure: RIGHT HEART CATH;  Surgeon: Dolores Patty, MD;  Location: [facility] INVASIVE CV LAB;  Service: Cardiovascular;  Laterality: Right;   SALIVARY GLAND SURGERY  1992    Current Medications: Current Meds  Medication Sig   albuterol (VENTOLIN HFA) 108 (90 Base) MCG/ACT inhaler Inhale 2 puffs into the lungs every 6 (six) hours as needed for wheezing or shortness of breath.   ARIPiprazole (ABILIFY) 2 MG tablet Take 2 mg by mouth daily.   atorvastatin (LIPITOR) 20 MG tablet TAKE ONE TABLET BY MOUTH EVERY DAY   buPROPion (WELLBUTRIN XL) 150 MG 24 hr tablet Take 150 mg by mouth 2 (two) times daily.   carvedilol (COREG) 12.5 MG tablet Take 1 tablet (12.5 mg total) by mouth 2 (two) times daily with a meal.   celecoxib (CELEBREX) 100 MG capsule Take 1 capsule (100 mg total) by mouth 2 (two) times daily.   citalopram (CELEXA) 20 MG tablet Take 1 tablet by mouth daily.   CREON 36000-114000 units CPEP capsule Take 36,000 Units by  mouth 3 (three) times daily before meals.   dicyclomine (BENTYL) 10 MG capsule Take 10 mg by mouth in the morning and at bedtime.   famotidine (PEPCID) 20 MG tablet Take 20 mg by mouth 2 (two) times daily.   gabapentin (NEURONTIN) 300 MG capsule Take 300 mg by mouth 2 (two) times daily. Take 1 capsule  in AM and 1 capsule q PM and 2 QHS for nerve pain   glipiZIDE (GLUCOTROL XL) 5 MG 24 hr tablet TAKE ONE TABLET BY MOUTH EVERY DAY WITH WITH BREAKFAST   JARDIANCE 10 MG TABS tablet Take 1 tablet (10 mg total) by mouth daily.   LORazepam (ATIVAN) 0.5 MG tablet Take 0.5 mg by mouth every 8 (eight) hours as needed for anxiety.    magnesium oxide (MAG-OX) 400 (240 Mg) MG tablet Take  400 mg by mouth daily.   metFORMIN (GLUCOPHAGE) 1000 MG tablet Take 1 tablet (1,000 mg total) by mouth 2 (two) times daily with a meal.   omeprazole (PRILOSEC) 20 MG capsule Take 20 mg by mouth 2 (two) times daily before a meal.   OZEMPIC, 1 MG/DOSE, 4 MG/3ML SOPN Inject 1 mg into the skin once a week.   PEG 3350-KCl-NaBcb-NaCl-NaSulf (PEG-3350/ELECTROLYTES) 236 g SOLR Take by mouth as directed.   torsemide (DEMADEX) 20 MG tablet Take 3 tablets (60 mg total) by mouth 2 (two) times daily.   Vitamin D, Ergocalciferol, (DRISDOL) 50000 units CAPS capsule Take 1 capsule by mouth 3 (three) times a week.   [DISCONTINUED] amLODipine (NORVASC) 10 MG tablet Take 10 mg by mouth daily.   [DISCONTINUED] ENTRESTO 97-103 MG TAKE ONE TABLET BY MOUTH TWICE DAILY   [DISCONTINUED] spironolactone (ALDACTONE) 25 MG tablet TAKE ONE TABLET BY MOUTH EVERY DAY     Allergies:   Patient has no known allergies.   Social History   Socioeconomic History   Marital status: Married    Spouse name: Not on file   Number of children: Not on file   Years of education: Not on file   Highest education level: Not on file  Occupational History   Occupation: administrative for [employer]  Tobacco Use   Smoking status: Former    Current packs/day: 0.00    Average packs/day: 0.3 packs/day for 36.0 years (9.0 ttl pk-yrs)    Types: Cigarettes    Start date: 11/18/1985    Quit date: 11/18/2021    Years since quitting: 1.6   Smokeless tobacco: Never   Tobacco comments:    QUIT SMOKING IN APRIL  Vaping Use   Vaping status: Every Day  Substance and Sexual Activity   Alcohol use: Not Currently    Comment: maybe once a month   Drug use: No   Sexual activity: Not on file  Other Topics Concern   Not on file  Social History Narrative   Not on file   Social Determinants of Health   Financial Resource Strain: Unknown (07/17/2019)   Overall Financial Resource Strain (CARDIA)    Difficulty of Paying Living Expenses: Patient  declined  Food Insecurity: Unknown (07/17/2019)   Hunger Vital Sign    Worried About Running Out of Food in the Last Year: Patient declined    Ran Out of Food in the Last Year: Patient declined  Transportation Needs: Unknown (07/17/2019)   PRAPARE - Administrator, Civil Service (Medical): Patient declined    Lack of Transportation (Non-Medical): Patient declined  Physical Activity: Unknown (07/17/2019)   Exercise Vital Sign  Days of Exercise per Week: Patient declined    Minutes of Exercise per Session: Patient declined  Stress: Not on file  Social Connections: Unknown (07/17/2019)   Social Connection and Isolation Panel [NHANES]    Frequency of Communication with Friends and Family: Patient declined    Frequency of Social Gatherings with Friends and Family: Patient declined    Attends Religious Services: Patient declined    Database administrator or Organizations: Patient declined    Attends Engineer, structural: Patient declined    Marital Status: Patient declined     Family History: The patient's family history includes Bladder Cancer in his maternal grandfather; CVA in his father; Cervical cancer in his maternal grandmother; Cirrhosis in his maternal grandfather; Diabetes in his father; Heart disease (age of onset: 56) in his brother; Hypertension in his mother; Throat cancer in his father, maternal grandfather, and maternal grandmother. There is no history of Colon cancer.  ROS:   Please see the history of present illness.     All other systems reviewed and are negative.  EKGs/Labs/Other Studies Reviewed:    The following studies were reviewed today:  EKG Interpretation Date/Time:  Tuesday June 26 2023 09:56:58 EST Ventricular Rate:  94 PR Interval:  182 QRS Duration:  94 QT Interval:  352 QTC Calculation: 440 R Axis:   42  Text Interpretation: Normal sinus rhythm Normal ECG Confirmed by Debbe Odea ([id number]) on 06/26/2023 10:17:48 AM     Recent Labs: 07/17/2022: ALT 15 09/10/2022: B Natriuretic Peptide 12.7; Hemoglobin 12.9; Platelets 298 02/13/2023: BUN 23; Creatinine, Ser 0.94; Potassium 4.1; Sodium 135  Recent Lipid Panel    Component Value Date/Time   CHOL  12/16/2008 0340    185        ATP III CLASSIFICATION:  <200     mg/dL   Desirable  528-413  mg/dL   Borderline High  >=244    mg/dL   High          TRIG 010 (H) 12/16/2008 0340   HDL 37 (L) 12/16/2008 0340   CHOLHDL 5.0 12/16/2008 0340   VLDL 41 (H) 12/16/2008 0340   LDLCALC (H) 12/16/2008 0340    107        Total Cholesterol/HDL:CHD Risk Coronary Heart Disease Risk Table                     Men   Women  1/2 Average Risk   3.4   3.3  Average Risk       5.0   4.4  2 X Average Risk   9.6   7.1  3 X Average Risk  23.4   11.0        Use the calculated Patient Ratio above and the CHD Risk Table to determine the patient's CHD Risk.        ATP III CLASSIFICATION (LDL):  <100     mg/dL   Optimal  272-536  mg/dL   Near or Above                    Optimal  130-159  mg/dL   Borderline  644-034  mg/dL   High  >742     mg/dL   Very High     Risk Assessment/Calculations:          Physical Exam:    VS:  BP (!) 84/64 (BP Location: Left Arm, Patient Position: Sitting, Cuff Size: Large)   Pulse 94  Ht 6\' 2"  (1.88 m)   Wt (!) 316 lb 9.6 oz (143.6 kg)   SpO2 99%   BMI 40.65 kg/m     Wt Readings from Last 3 Encounters:  06/26/23 (!) 316 lb 9.6 oz (143.6 kg)  03/29/23 (!) 321 lb (145.6 kg)  03/26/23 (!) 321 lb (145.6 kg)     GEN:  Well nourished, well developed in no acute distress HEENT: Normal NECK: No JVD; No carotid bruits CARDIAC: RRR, no murmurs, rubs, gallops RESPIRATORY: Diminished breath sounds at bases, no wheezing ABDOMEN: Soft, non-tender, distended MUSCULOSKELETAL: trace edema; No deformity  SKIN: Warm and dry NEUROLOGIC:  Alert and oriented x 3 PSYCHIATRIC:  Normal affect   ASSESSMENT:    1. NICM (nonischemic cardiomyopathy)  (HCC)   2. Primary hypertension    PLAN:    In order of problems listed above:  Nonischemic cardiomyopathy, etiology possibly hereditary (brother with HF at age 45).  Initial echo 20 to 25%, last echo 7/24 EF 55-60 %.  Fatigue, hypotension.  Reduce Entresto to 49-51 mg twice daily, continue Coreg 12.5 mg twice daily, Aldactone 12.5 mg daily, torsemide to 60 mg twice daily.Jardiance.   Hypertension, BP low..  Reduce Entresto to 49-51 mg twice daily, continue Coreg 12.5 mg twice daily, Aldactone 12.5 mg daily.   Follow-up in 3 months.    Cardiac Rehabilitation Eligibility Assessment       Medication Adjustments/Labs and Tests Ordered: Current medicines are reviewed at length with the patient today.  Concerns regarding medicines are outlined above.  Orders Placed This Encounter  Procedures   Lipid panel   Basic Metabolic Panel (BMET)   EKG 12-Lead   Meds ordered this encounter  Medications   sacubitril-valsartan (ENTRESTO) 97-103 MG    Sig: TAKE HALF TABLET BY MOUTH TWICE A DAY.    Dispense:  60 tablet    Refill:  6    This prescription was filled on 02/16/2023. Any refills authorized will be placed on file.   spironolactone (ALDACTONE) 25 MG tablet    Sig: Take 0.5 tablets (12.5 mg total) by mouth daily.    Patient Instructions  Medication Instructions:   DECREASE Entresto - Taker 1/2 tablet by mouth twice a day.   *If you need a refill on your cardiac medications before your next appointment, please call your pharmacy*   Lab Work:  Your physician recommends you have labs - BMP / Lipid  If you have labs (blood work) drawn today and your tests are completely normal, you will receive your results only by: MyChart Message (if you have MyChart) OR A paper copy in the mail If you have any lab test that is abnormal or we need to change your treatment, we will call you to review the results.   Testing/Procedures:  None Ordered   Follow-Up: At [facility],  you and your health needs are our priority.  As part of our continuing mission to provide you with exceptional heart care, we have created designated Provider Care Teams.  These Care Teams include your primary Cardiologist (physician) and Advanced Practice Providers (APPs -  Physician Assistants and Nurse Practitioners) who all work together to provide you with the care you need, when you need it.  We recommend signing up for the patient portal called "MyChart".  Sign up information is provided on this After Visit Summary.  MyChart is used to connect with patients for Virtual Visits (Telemedicine).  Patients are able to view lab/test results, encounter notes, upcoming appointments,  etc.  Non-urgent messages can be sent to your provider as well.   To learn more about what you can do with MyChart, go to [email or web address].    Your next appointment:   3 month(s)  Provider:   You may see Debbe Odea, MD or one of the following Advanced Practice Providers on your designated Care Team:   Nicolasa Ducking, NP Eula Listen, PA-C Cadence Fransico Michael, PA-C Charlsie Quest, NP Carlos Levering, NP    Signed, Debbe Odea, MD  06/26/2023 11:06 AM    [facility]

## 2023-06-27 ENCOUNTER — Other Ambulatory Visit: Payer: PRIVATE HEALTH INSURANCE

## 2023-06-27 ENCOUNTER — Inpatient Hospital Stay: Admission: RE | Admit: 2023-06-27 | Payer: PRIVATE HEALTH INSURANCE | Source: Ambulatory Visit

## 2023-06-27 ENCOUNTER — Inpatient Hospital Stay
Admission: RE | Admit: 2023-06-27 | Discharge: 2023-06-27 | Disposition: A | Discharge status: Home / Self Care | Payer: PRIVATE HEALTH INSURANCE | Source: Ambulatory Visit | Attending: Orthopedic Surgery | Admitting: Orthopedic Surgery

## 2023-06-27 LAB — BASIC METABOLIC PANEL
BUN/Creatinine Ratio: 15 (ref 9–20)
BUN: 14 mg/dL (ref 6–24)
CO2: 25 mmol/L (ref 20–29)
Calcium: 9 mg/dL (ref 8.7–10.2)
Chloride: 98 mmol/L (ref 96–106)
Creatinine, Ser: 0.93 mg/dL (ref 0.76–1.27)
Glucose: 146 mg/dL — ABNORMAL HIGH (ref 70–99)
Potassium: 4.4 mmol/L (ref 3.5–5.2)
Sodium: 138 mmol/L (ref 134–144)
eGFR: 100 mL/min/{1.73_m2} (ref >59)

## 2023-06-27 LAB — LIPID PANEL
Chol/HDL Ratio: 3 ratio (ref 0.0–5.0)
Cholesterol, Total: 121 mg/dL (ref 100–199)
HDL: 40 mg/dL (ref >39)
LDL Chol Calc (NIH): 55 mg/dL (ref 0–99)
Triglycerides: 148 mg/dL (ref 0–149)
VLDL Cholesterol Cal: 26 mg/dL (ref 5–40)

## 2023-07-02 ENCOUNTER — Other Ambulatory Visit: Payer: Self-pay | Admitting: Nurse Practitioner

## 2023-07-02 ENCOUNTER — Other Ambulatory Visit: Payer: Self-pay | Admitting: *Deleted

## 2023-07-02 DIAGNOSIS — E119 Type 2 diabetes mellitus without complications: Secondary | ICD-10-CM

## 2023-07-02 MED ORDER — TORSEMIDE 20 MG PO TABS: 60.0000 mg | ORAL_TABLET | Freq: Two times a day (BID) | ORAL | 0 refills | Status: DC

## 2023-07-05 ENCOUNTER — Ambulatory Visit: Payer: PRIVATE HEALTH INSURANCE | Admitting: Nurse Practitioner

## 2023-07-05 DIAGNOSIS — Z7985 Long-term (current) use of injectable non-insulin antidiabetic drugs: Secondary | ICD-10-CM

## 2023-07-05 DIAGNOSIS — E119 Type 2 diabetes mellitus without complications: Secondary | ICD-10-CM

## 2023-07-05 DIAGNOSIS — Z7984 Long term (current) use of oral hypoglycemic drugs: Secondary | ICD-10-CM

## 2023-07-18 ENCOUNTER — Ambulatory Visit
Admission: RE | Admit: 2023-07-18 | Discharge: 2023-07-18 | Disposition: A | Discharge status: Home / Self Care | Payer: PRIVATE HEALTH INSURANCE | Source: Ambulatory Visit | Attending: Orthopedic Surgery | Admitting: Orthopedic Surgery

## 2023-07-18 DIAGNOSIS — M25512 Pain in left shoulder: Secondary | ICD-10-CM

## 2023-07-18 MED ORDER — IOPAMIDOL (ISOVUE-M 200) INJECTION 41%
12.0000 mL | Freq: Once | INTRAMUSCULAR | Status: AC
  Administered 2023-07-18: 12 mL via INTRA_ARTICULAR

## 2023-07-25 NOTE — Progress Notes (Unsigned)
PCP: Alvina Filbert, MD [facility]) Primary Cardiologist: Debbe Odea, MD (last seen 11/24) HF provider: Arvilla Meres, MD (last seen 04/24)  HPI:  [name] Gregory is a 52 y.o. male with a history of HTN, T2DM, tobacco abuse, fatty liver, lymphedema, obesity, OSA (on CPAP) and HFrEF.  Has multiple family members with HF on his father's side. Developed HF symptoms in Jan/Feb 2023. He had a prior TTE in 2020 that showed moderate concentric hypertrophy with normal EF. He was seen by Dr. Jaymes Graff at [facility] 11/03/21 nd underwent echo and stress test. 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 LVEDP was elevated at . Echo same day showed EF 20-25%, LV dilation, hypokinesis of the apical anterior, apical, apical inferior and apical septal segments, and grade III diastolic dysfunction. He also complained that his heart was fluttering, lasts for several seconds. Given this a Ziopatch was placed which was unrevealing. (A several brief runs SVT. 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   CPX testing completed 12/27/22: Markedly submaximal exercise testing with peak RER (0.74) not rising above resting levels. Patient only exercised 2:45 on modified Naughton protocol. Very little can be gleaned from this test but based on existing data patient appears limited by his HF, deconditioning and obesity. Would recommend exercise training program to build stamina and then retest.  Echo 03/20/22 @ [facility] EF 55% Echo 02/13/23: EF 55-60% with mild LVH, Grade I DD and mild dilatation of aortic root at 40 mm  cMRI: 11/23: EF 52% RV normal. No LGE.   Returns today for HF f/u with a chief complaint of    Previous cardiac studies:  RHC on  06/28/22:  RHC with low filling pressures and high CO.  RA = 3 RV = 35/7 PA = 36/19 (24) PCW = 11 Fick cardiac output/index = 9.0/3.3 PVR = 1.4 WU FA sat = 96% PA sat = 69%, 71% SVC sat = 78%  ROS: All systems negative except as listed in HPI, PMH and Problem List.  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  Tobacco Use   Smoking status: Former    Current packs/day: 0.00    Average packs/day: 0.3 packs/day for 36.0 years (9.0 ttl pk-yrs)    Types: Cigarettes    Start date: 11/18/1985    Quit date: 11/18/2021    Years since quitting: 1.6   Smokeless tobacco: Never   Tobacco comments:    QUIT SMOKING IN APRIL  Vaping Use   Vaping status: Every Day  Substance and Sexual Activity   Alcohol use: Not Currently    Comment: maybe once a month   Drug use: No   Sexual activity: Not on file  Other Topics Concern   Not on file  Social History Narrative   Not on file   Social Determinants of Health   Financial Resource Strain: Unknown (07/17/2019)   Overall Financial Resource Strain (CARDIA)    Difficulty of Paying Living Expenses: Patient declined  Food Insecurity: Unknown (07/17/2019)   Hunger Vital Sign  Worried About Programme researcher, broadcasting/film/video in the Last Year: Patient declined    Barista in the Last Year: Patient declined  Transportation Needs: Unknown (07/17/2019)   PRAPARE - Administrator, Civil Service (Medical): Patient declined    Lack of Transportation (Non-Medical): Patient declined  Physical Activity: Unknown (07/17/2019)   Exercise Vital Sign    Days of Exercise per Week: Patient declined    Minutes of Exercise per Session: Patient declined  Stress: Not on file  Social Connections: Unknown (07/17/2019)   Social  Connection and Isolation Panel [NHANES]    Frequency of Communication with Friends and Family: Patient declined    Frequency of Social Gatherings with Friends and Family: Patient declined    Attends Religious Services: Patient declined    Database administrator or Organizations: Patient declined    Attends Banker Meetings: Patient declined    Marital Status: Patient declined  Intimate Partner Violence: Unknown (07/17/2019)   Humiliation, Afraid, Rape, and Kick questionnaire    Fear of Current or Ex-Partner: Patient declined    Emotionally Abused: Patient declined    Physically Abused: Patient declined    Sexually Abused: Patient declined    FH:  Family History  Problem Relation Age of Onset   Hypertension Mother    CVA Father    Diabetes Father    Throat cancer Father    Heart disease Brother 29   Cervical cancer Maternal Grandmother    Throat cancer Maternal Grandmother    Throat cancer Maternal Grandfather    Bladder Cancer Maternal Grandfather    Cirrhosis Maternal Grandfather    Colon cancer Neg Hx     Past Medical History:  Diagnosis Date   Carpal tunnel syndrome    CHF (congestive heart failure) (HCC)    Complication of anesthesia    WOKE UP DURING SURGERY   Diabetes (HCC)    Fatty liver    GERD (gastroesophageal reflux disease)    Hemangioma of liver    Hypertension    Rotator cuff disorder    Sleep apnea    URI (upper respiratory infection)     Current Outpatient Medications  Medication Sig Dispense Refill   albuterol (VENTOLIN HFA) 108 (90 Base) MCG/ACT inhaler Inhale 2 puffs into the lungs every 6 (six) hours as needed for wheezing or shortness of breath. 8 g 2   ARIPiprazole (ABILIFY) 2 MG tablet Take 2 mg by mouth daily.     atorvastatin (LIPITOR) 20 MG tablet TAKE ONE TABLET BY MOUTH EVERY DAY 90 tablet 3   buPROPion (WELLBUTRIN XL) 150 MG 24 hr tablet Take 150 mg by mouth 2 (two) times daily.     carvedilol (COREG) 12.5 MG tablet Take 1  tablet (12.5 mg total) by mouth 2 (two) times daily with a meal. 180 tablet 3   celecoxib (CELEBREX) 100 MG capsule Take 1 capsule (100 mg total) by mouth 2 (two) times daily. 60 capsule 0   citalopram (CELEXA) 20 MG tablet Take 1 tablet by mouth daily.     CREON 36000-114000 units CPEP capsule Take 36,000 Units by mouth 3 (three) times daily before meals.     dicyclomine (BENTYL) 10 MG capsule Take 10 mg by mouth in the morning and at bedtime.     famotidine (PEPCID) 20 MG tablet Take 20 mg by mouth 2 (two) times daily.     gabapentin (NEURONTIN) 300 MG capsule Take 300 mg by mouth 2 (two) times  daily. Take 1 capsule  in AM and 1 capsule q PM and 2 QHS for nerve pain     glipiZIDE (GLUCOTROL XL) 5 MG 24 hr tablet TAKE ONE TABLET BY MOUTH EVERY DAY WITH WITH BREAKFAST 90 tablet 1   JARDIANCE 10 MG TABS tablet Take 1 tablet (10 mg total) by mouth daily. 90 tablet 3   LORazepam (ATIVAN) 0.5 MG tablet Take 0.5 mg by mouth every 8 (eight) hours as needed for anxiety.      magnesium oxide (MAG-OX) 400 (240 Mg) MG tablet Take 400 mg by mouth daily.     metFORMIN (GLUCOPHAGE) 1000 MG tablet Take 1 tablet (1,000 mg total) by mouth 2 (two) times daily with a meal. 180 tablet 3   omeprazole (PRILOSEC) 20 MG capsule Take 20 mg by mouth 2 (two) times daily before a meal.     OZEMPIC, 1 MG/DOSE, 4 MG/3ML SOPN Inject 1 mg into the skin once a week.     OZEMPIC, 2 MG/DOSE, 8 MG/3ML SOPN INJECT 2MG  SUBCUTANEOUSLY ONCE WEEKLY 3 mL 3   PEG 3350-KCl-NaBcb-NaCl-NaSulf (PEG-3350/ELECTROLYTES) 236 g SOLR Take by mouth as directed.     sacubitril-valsartan (ENTRESTO) 97-103 MG TAKE HALF TABLET BY MOUTH TWICE A DAY. 60 tablet 6   spironolactone (ALDACTONE) 25 MG tablet Take 0.5 tablets (12.5 mg total) by mouth daily.     torsemide (DEMADEX) 20 MG tablet Take 3 tablets (60 mg total) by mouth 2 (two) times daily. 90 tablet 0   Vitamin D, Ergocalciferol, (DRISDOL) 50000 units CAPS capsule Take 1 capsule by mouth 3 (three)  times a week.     No current facility-administered medications for this visit.     PHYSICAL EXAM:  General:  Well appearing. No resp difficulty HEENT: normal Neck: supple. JVP flat. No lymphadenopathy or thryomegaly appreciated. Cor: PMI normal. Regular rate & rhythm. No rubs, gallops or murmurs. Lungs: clear Abdomen: soft, nontender, nondistended. No hepatosplenomegaly. No bruits or masses.  Extremities: no cyanosis, clubbing, rash, trace pitting edema bilaterally Neuro: alert & oriented x3, cranial nerves grossly intact. Moves all 4 extremities w/o difficulty. Affect pleasant.   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 Referred to genetics for further discussion - SPEP/IFE showed no monoclonal protein - NYHA class III - euvolemic - weight 322 pound from last visit here 4 months ago - Echo 03/20/22 @ [facility] EF 55%, LVIDd ~5.5, normal RV, RVSP ~29 - Echo 02/13/23: EF 55-60% with mild LVH, Grade I DD and mild dilatation of aortic root at 40 mm - cMRI: 11/23: EF 52% RV normal. No LGE. - Cath 4/23 non-obstructive CAD - RHC 11/23  RA 3 PA 36/19 (24) PCW 11 Fick CO/CI 9.0/3.3 PA sat = 69%, 71% SVC sat = 78% - RHC with elevated CO but no evidence of shunt of previous ab CT with contrast or cMRI - continue torsemide 60mg  BID - Continue carvedilol 25 bid  - Continue Jardiance 10mg  daily - Continue Entresto 97/103 bid  - Continue Spiro 12.5 daily - Cardiac symptoms remain out of proportion to objective findings - CPX 12/27/22: Markedly submaximal exercise testing with  peak RER (0.74) not rising above resting levels.  Patient only exercised 2:45 on modified Naughton protocol. Very little can be gleaned from this test but based on existing data patient appears limited by his HF, deconditioning and obesity. Would recommend exercise training program to build stamina and then retest.  - pulmonary rehab was supposed to start but patient missed his orientation phone call; emphasized the importance of getting this r/s so that the actual exercise classes can begin - BNP 09/10/22 was 12.7  2: Essential hypertension- - BP  - followed by PCP Durene Cal)  - BMP 06/26/23 showed sodium 138, potassium 4.4, creatinine 0.93 & GFR 100  3: DM2- - saw endocrinology Lurlean Leyden) 07/24 - continue jardiance 10mg  daily - continue glipizide 5mg  daily - continue metformin 1000mg  BID - continue ozemipic 2mg  weekly - A1c 02/20/23 was 6.6%  4: Palpitations- - Zio Apr 2023, no significant arrhythmias, rare ventricular ectopy - saw cardiology (Abfor-Etang) 11/24  5: Lymphedema- - saw vascular (Schnier) 08/24 - continue lymphedema pumps & compression socks daily  6: Chronic Hypoxic Respiratory Failure- - Per patient, was dropping O2 sat with exertion and qualified for home O2 - Unclear why he's requiring O2. Has been evaluted by Pulmonology (Dr. Vassie Loll) 08/24 - CPX 12/27/22: Markedly submaximal exercise testing with peak RER (0.74) not rising above resting levels. Patient only exercised 2:45 on modified Naughton protocol. Very little can be gleaned from this test but based on existing data patient appears limited by his HF, deconditioning and obesity. - he has to call pulmonary rehab back and r/s his missed orientation phone call

## 2023-07-26 ENCOUNTER — Encounter: Payer: Self-pay | Admitting: Family

## 2023-07-26 ENCOUNTER — Ambulatory Visit: Payer: PRIVATE HEALTH INSURANCE | Attending: Family | Admitting: Family

## 2023-07-26 VITALS — BP 101/77 | HR 99 | Ht 74.0 in | Wt 307.0 lb

## 2023-07-26 DIAGNOSIS — K760 Fatty (change of) liver, not elsewhere classified: Secondary | ICD-10-CM | POA: Insufficient documentation

## 2023-07-26 DIAGNOSIS — Z7984 Long term (current) use of oral hypoglycemic drugs: Secondary | ICD-10-CM | POA: Insufficient documentation

## 2023-07-26 DIAGNOSIS — I10 Essential (primary) hypertension: Secondary | ICD-10-CM

## 2023-07-26 DIAGNOSIS — I5022 Chronic systolic (congestive) heart failure: Secondary | ICD-10-CM | POA: Diagnosis present

## 2023-07-26 DIAGNOSIS — Z87891 Personal history of nicotine dependence: Secondary | ICD-10-CM | POA: Insufficient documentation

## 2023-07-26 DIAGNOSIS — Z9981 Dependence on supplemental oxygen: Secondary | ICD-10-CM | POA: Insufficient documentation

## 2023-07-26 DIAGNOSIS — J9611 Chronic respiratory failure with hypoxia: Secondary | ICD-10-CM | POA: Insufficient documentation

## 2023-07-26 DIAGNOSIS — E119 Type 2 diabetes mellitus without complications: Secondary | ICD-10-CM | POA: Insufficient documentation

## 2023-07-26 DIAGNOSIS — I890 Lymphedema, not elsewhere classified: Secondary | ICD-10-CM | POA: Diagnosis not present

## 2023-07-26 DIAGNOSIS — I110 Hypertensive heart disease with heart failure: Secondary | ICD-10-CM | POA: Insufficient documentation

## 2023-07-26 DIAGNOSIS — E669 Obesity, unspecified: Secondary | ICD-10-CM | POA: Insufficient documentation

## 2023-07-26 DIAGNOSIS — G4733 Obstructive sleep apnea (adult) (pediatric): Secondary | ICD-10-CM | POA: Diagnosis not present

## 2023-07-26 DIAGNOSIS — R002 Palpitations: Secondary | ICD-10-CM

## 2023-07-26 DIAGNOSIS — Z794 Long term (current) use of insulin: Secondary | ICD-10-CM

## 2023-07-26 DIAGNOSIS — I5032 Chronic diastolic (congestive) heart failure: Secondary | ICD-10-CM | POA: Diagnosis not present

## 2023-07-26 MED ORDER — TORSEMIDE 40 MG PO TABS: 40.0000 mg | ORAL_TABLET | Freq: Two times a day (BID) | ORAL | 5 refills | Status: DC

## 2023-07-26 NOTE — Patient Instructions (Signed)
INCREASE TORSEMIDE TO 40 MG TWICE DAILY

## 2023-08-02 ENCOUNTER — Telehealth: Payer: Self-pay

## 2023-08-02 NOTE — Telephone Encounter (Signed)
-----   Message from Burnard Bunting sent at 08/02/2023  1:08 PM EST ----- Pls f/u luke after all studies done thx

## 2023-08-02 NOTE — Progress Notes (Signed)
Pls f/u luke after all studies done thx

## 2023-08-02 NOTE — Telephone Encounter (Signed)
Please make sure patient has follow up appt to review all studies

## 2023-08-03 ENCOUNTER — Encounter: Payer: Self-pay | Admitting: Orthopedic Surgery

## 2023-08-14 ENCOUNTER — Ambulatory Visit: Payer: PRIVATE HEALTH INSURANCE | Admitting: Nurse Practitioner

## 2023-09-05 ENCOUNTER — Ambulatory Visit: Payer: PRIVATE HEALTH INSURANCE | Admitting: Orthopedic Surgery

## 2023-09-20 ENCOUNTER — Telehealth: Payer: Self-pay | Admitting: Acute Care

## 2023-09-20 ENCOUNTER — Other Ambulatory Visit: Payer: Self-pay

## 2023-09-20 DIAGNOSIS — Z122 Encounter for screening for malignant neoplasm of respiratory organs: Secondary | ICD-10-CM

## 2023-09-20 DIAGNOSIS — Z87891 Personal history of nicotine dependence: Secondary | ICD-10-CM

## 2023-09-20 NOTE — Telephone Encounter (Signed)
 Lung Cancer Screening Narrative/Criteria Questionnaire (Cigarette Smokers Only- No Cigars/Pipes/vapes)   [name] Gregory   SDMV:10/03/23 at 1030am / [facility]                                           09/10/1970                         LDCT: 10/04/23 at 10am / [facility] [place]    53 y.o.  Phone: [phone]  Lung Screening Narrative (confirm age 33-77 yrs Medicare / 50-80 yrs Private pay insurance)   Insurance information:first health   Referring Provider:Alva   This screening involves an initial phone call with a team member from our program. It is called a shared decision making visit. The initial meeting is required by insurance and Medicare to make sure you understand the program. This appointment takes about 15-20 minutes to complete. The CT scan will completed at a separate date/time. This scan takes about 5-10 minutes to complete and you may eat and drink before and after the scan.  Criteria questions for Lung Cancer Screening:   Are you a current or former smoker? Former Age began smoking: 53 yo   If you are a former smoker, what year did you quit smoking?2023 (within 15 yrs)   To calculate your smoking history, I need an accurate estimate of how many packs of cigarettes you smoked per day and for how many years. (Not just the number of PPD you are now smoking)   Years smoking 30 x Packs per day 1 = Pack years 30   (at least 20 pack yrs)   (Make sure they understand that we need to know how much they have smoked in the past, not just the number of PPD they are smoking now)  Do you have a personal history of cancer?  No    Do you have a family history of cancer? Yes  (cancer type and and relative) father/ lung  Are you coughing up blood?  No  Have you had unexplained weight loss of 15 lbs or more in the last 6 months? No  It looks like you meet all criteria.     Additional information: N/A

## 2023-09-21 ENCOUNTER — Encounter: Payer: Self-pay | Admitting: Orthopedic Surgery

## 2023-09-21 ENCOUNTER — Ambulatory Visit: Payer: PRIVATE HEALTH INSURANCE | Admitting: Orthopedic Surgery

## 2023-09-21 DIAGNOSIS — M25511 Pain in right shoulder: Secondary | ICD-10-CM | POA: Diagnosis not present

## 2023-09-21 DIAGNOSIS — G8929 Other chronic pain: Secondary | ICD-10-CM

## 2023-09-21 NOTE — Progress Notes (Signed)
 Office Visit Note   Patient: [name] Gregory           Date of Birth: Dec 30, 1970           MRN: [id number] Visit Date: 09/21/2023 Requested by: Katrinka Aquas, MD [address] [place],  [place] [address] PCP: Katrinka Aquas, MD  Subjective: Chief Complaint  Patient presents with   Other    Review MRI    HPI: [name] Gregory is a 53 y.o. male who presents to the office reporting bilateral shoulder pain longstanding right worse than left.  Symptoms ongoing for a year.  He does have adhesive capsulitis in both shoulders by MRI scan review.  Has erosion on the humeral head on the right-hand side.  Unclear etiology.  Patient is diabetic.  Also is on oxygen.  Has tried and failed a long course of conservative treatment..                ROS: All systems reviewed are negative as they relate to the chief complaint within the history of present illness.  Patient denies fevers or chills.  Assessment & Plan: Visit Diagnoses:  1. Chronic right shoulder pain     Plan: Impression is bilateral adhesive capsulitis in a diabetic patient with symptoms ongoing for a year.  This is a very difficult situation for [place].  It is explained to [place] that in diabetic patients the longer this frozen shoulder goes on the more nonpliable the tissue becomes in the more difficult it is to achieve and maintain range of motion with manipulation and capsular release.  Nonetheless he wants to for the right shoulder.  The risk and benefits are discussed including not limited to infection or vessel damage fracture incomplete pain relief as well as incomplete functional restoration.  I think is entirely possible after 1 year that we may not be able to make much gains at all and range of motion.  Patient understands risk and benefits and wishes to proceed.  All questions answered.  Follow-Up Instructions: No follow-ups on file.   Orders:  No orders of the defined types were placed in this encounter.  No orders of the  defined types were placed in this encounter.     Procedures: No procedures performed   Clinical Data: No additional findings.  Objective: Vital Signs: There were no vitals taken for this visit.  Physical Exam:  Constitutional: Patient appears well-developed HEENT:  Head: Normocephalic Eyes:EOM are normal Neck: Normal range of motion Cardiovascular: Normal rate Pulmonary/chest: Effort normal Neurologic: Patient is alert Skin: Skin is warm Psychiatric: Patient has normal mood and affect  Ortho Exam: Ortho exam demonstrates range of motion on the right to 15/80/100.  Range of motion on the left is 15/85/105.  Rotator cuff strength is pretty reasonable infraspinatus supraspinatus and subscap muscle testing.  No masses lymphadenopathy or skin changes noted in either shoulder girdle region.  Specialty Comments:  No specialty comments available.  Imaging: No results found.   PMFS History: Patient Active Problem List   Diagnosis Date Noted   Lymphedema 03/26/2023   Bilateral sensorineural hearing loss 01/04/2023   Chronic respiratory failure with hypoxia (HCC) 01/02/2022   HTN (hypertension) 06/10/2020   Loose stools 06/09/2020   GERD (gastroesophageal reflux disease) 06/09/2020   Elevated LFTs 06/09/2020   Early satiety 06/09/2020   Fatty liver 06/09/2020   Dizziness    Hyperglycemia    Liver enzyme elevation    Nausea vomiting and diarrhea  Transaminitis    AKI (acute kidney injury) (HCC) 07/17/2019   Abdominal pain 07/17/2019   Nasal congestion 07/17/2019   Post-nasal drainage 07/17/2019   OSA on CPAP 06/14/2017   Morbid (severe) obesity due to excess calories (HCC) 06/14/2017   Abdominal pain, epigastric 04/24/2016   RUQ pain 04/24/2016   Microcytic anemia 04/24/2016   Diarrhea 04/24/2016   Elevated sed rate 04/24/2016   Splenomegaly 04/24/2016   Past Medical History:  Diagnosis Date   Carpal tunnel syndrome    CHF (congestive heart failure) (HCC)     Complication of anesthesia    WOKE UP DURING SURGERY   Diabetes (HCC)    Fatty liver    GERD (gastroesophageal reflux disease)    Hemangioma of liver    Hypertension    Rotator cuff disorder    Sleep apnea    URI (upper respiratory infection)     Family History  Problem Relation Age of Onset   Hypertension Mother    CVA Father    Diabetes Father    Throat cancer Father    Heart disease Brother 45   Cervical cancer Maternal Grandmother    Throat cancer Maternal Grandmother    Throat cancer Maternal Grandfather    Bladder Cancer Maternal Grandfather    Cirrhosis Maternal Grandfather    Colon cancer Neg Hx     Past Surgical History:  Procedure Laterality Date   BIOPSY  05/04/2016   Procedure: BIOPSY;  Surgeon: Lamar CHRISTELLA Hollingshead, MD;  [facility]: [facility];  Service: Endoscopy;;  gastric bx   CARDIAC CATHETERIZATION Bilateral 10/2021   COLONOSCOPY WITH PROPOFOL  N/A 05/04/2016    PROPOFOL ;  Surgeon: Lamar CHRISTELLA Hollingshead, MD; diverticulosis throughout the entire colon, otherwise normal.  Repeat due in 2027.   ESOPHAGOGASTRODUODENOSCOPY (EGD) WITH PROPOFOL  N/A 05/04/2016   PROPOFOL ;  Surgeon: Lamar CHRISTELLA Hollingshead, MD; LA grade B esophagitis, gastritis, small hiatal hernia.   EYE SURGERY     RIGHT HEART CATH Right 06/28/2022   Procedure: RIGHT HEART CATH;  Surgeon: Cherrie Toribio SAUNDERS, MD;  Location: [facility];  Service: Cardiovascular;  Laterality: Right;   SALIVARY GLAND SURGERY  1992   Social History   Occupational History   Occupation: administrative for [employer]  Tobacco Use   Smoking status: Former    Current packs/day: 0.00    Average packs/day: 0.3 packs/day for 36.0 years (9.0 ttl pk-yrs)    Types: Cigarettes    Start date: 11/18/1985    Quit date: 11/18/2021    Years since quitting: 1.8   Smokeless tobacco: Never   Tobacco comments:    QUIT SMOKING IN APRIL  Vaping Use   Vaping status: Every Day  Substance and Sexual Activity   Alcohol use: Not Currently     Comment: maybe once a month   Drug use: No   Sexual activity: Not on file

## 2023-09-25 ENCOUNTER — Ambulatory Visit (HOSPITAL_BASED_OUTPATIENT_CLINIC_OR_DEPARTMENT_OTHER): Payer: PRIVATE HEALTH INSURANCE | Admitting: Pulmonary Disease

## 2023-09-26 ENCOUNTER — Ambulatory Visit: Payer: PRIVATE HEALTH INSURANCE | Admitting: Cardiology

## 2023-09-28 ENCOUNTER — Ambulatory Visit: Payer: PRIVATE HEALTH INSURANCE | Attending: Medical | Admitting: Medical

## 2023-09-28 ENCOUNTER — Encounter: Payer: Self-pay | Admitting: Medical

## 2023-09-28 VITALS — BP 130/76 | HR 97 | Ht 74.0 in | Wt 305.8 lb

## 2023-09-28 DIAGNOSIS — R0602 Shortness of breath: Secondary | ICD-10-CM | POA: Diagnosis not present

## 2023-09-28 DIAGNOSIS — I428 Other cardiomyopathies: Secondary | ICD-10-CM

## 2023-09-28 DIAGNOSIS — Z79899 Other long term (current) drug therapy: Secondary | ICD-10-CM

## 2023-09-28 DIAGNOSIS — I272 Pulmonary hypertension, unspecified: Secondary | ICD-10-CM

## 2023-09-28 DIAGNOSIS — I10 Essential (primary) hypertension: Secondary | ICD-10-CM

## 2023-09-28 DIAGNOSIS — I5032 Chronic diastolic (congestive) heart failure: Secondary | ICD-10-CM | POA: Diagnosis not present

## 2023-09-28 DIAGNOSIS — J9611 Chronic respiratory failure with hypoxia: Secondary | ICD-10-CM

## 2023-09-28 MED ORDER — CARVEDILOL 25 MG PO TABS: 25.0000 mg | ORAL_TABLET | Freq: Two times a day (BID) | ORAL | 3 refills | Status: AC

## 2023-09-28 NOTE — Progress Notes (Signed)
Cardiology Office Note:  .   Date:  09/28/2023  ID:  [name] Gregory, DOB [birth year], MRN [id number] PCP: Alvina Filbert, MD   HeartCare Providers Cardiologist:  Debbe Odea, MD {    History of Present Illness: .   [name] Gregory is a 53 y.o. male with a history of nonischemic cardiomyopathy being, HFimpEF, nonobstructive CAD, hypertension, diabetes, fatty liver, OSA on CPAP, chronic hypoxic respiratory failure on 2-4L O2 at home,  former smoker who presents for 89-month follow-up.  Patient was previously seen at [facility].  Echo in 2023 showed EF of 20 to 25%. Left heart cath in April 2023 showed no evidence of CAD.   Echo from July 2023 showed EF of 45 to 50%.  Cardiac MRI November 2023 showed EF of 52%  Patient was last seen in November 2024 was overall stable from a cardiac perspective.  Today, the patient reports worsening SOB, chest pressure, tiredness and LLE. He took extra torsemide with improvement of volume status. Feels tired like when he was diagnosed with CHF. He uses 2L O2 continuos, can increase to 3-4L. He uses CPAP at night. He notes mild chest pain and lower leg edema. He has been losing weight with diet changes. He had Flu-like symptoms about 3 weeks ago with chills, naseua, vomiting.Chest pain is a pressure on the right side that is worse with ambulation.     Studies Reviewed: .    Echo 02/2023     1. Left ventricular ejection fraction, by estimation, is 55 to 60%. The  left ventricle has normal function. The left ventricle has no regional  wall motion abnormalities. There is mild left ventricular hypertrophy.  Left ventricular diastolic parameters  are consistent with Grade I diastolic dysfunction (impaired relaxation).   2. Right ventricular systolic function is normal. The right ventricular  size is normal.   3. Left atrial size was mildly dilated.   4. The mitral valve is normal in structure. No evidence of mitral valve  regurgitation. No evidence of mitral  stenosis.   5. The aortic valve is tricuspid. Aortic valve regurgitation is not  visualized. No aortic stenosis is present.   6. There is mild dilatation of the aortic root, measuring 40 mm. There is  borderline dilatation of the ascending aorta, measuring 37 mm.   7. The inferior vena cava is normal in size with greater than 50%  respiratory variability, suggesting right atrial pressure of 3 mmHg.   RHC 06/2022 Findings:   RA = 3 RV = 35/7 PA = 36/19 (24) PCW = 11 Fick cardiac output/index = 9.0/3.3 PVR = 1.4 WU FA sat = 96% PA sat = 69%, 71% SVC sat = 78%   Assessment: 1. Mild PAH in setting of high output 2. Normal left-sided filling pressures   Plan/Discussion:   Suggest weight loss.    Arvilla Meres, MD  10:44 AM  cMRI 06/2022 MPRESSION: 1.  Normal LV size and function, LVEF = 52%   2. No evidence for late gadolinium enhancement, scar or infiltrative disease.   3.  No significant valvular abnormalities.   4.  Normal RV size and function.   5.  Small pericardial effusion.   LHC 11/2021 Hemodynamics and Left Heart Catheterization  Aortic pressure: 111/81 mm Hg (mean 95 mm Hg)  LVEDP = 31 mm Hg   Left Ventriculogram  RAO Left Ventriculogram:Abnormal  LAO Left Ventriculogram: Not Performed  Ejection Fraction (visual estimate): 20%  Mitral Regurgitation: None  Wall motion: Global  hypokinesis   Coronary Angiography  Dominance: Left   Left Main:  The left main coronary artery (LMCA) is a large-caliber vessel  that originates from the left coronary sinus. It bifurcates into the left  anterior descending (LAD) and left circumflex (LCx) arteries. There is no  angiographic evidence of significant disease in the LMCA.   LAD: The LAD is a large-caliber vessel that gives off 2 diagonal (D)  branches before it wraps around the apex. D1 is a medium-caliber vessel.  D2 is a medium-caliber vessel.There is an short segment of impressive  myocardial bridging in  the mid to distal LAD. There is no angiographic  evidence of significant disease in the LAD.   Left Circumflex: The LCx is a large-caliber vessel that gives off 2 obtuse  marginal (OM) branches and then continues in the AV groove before  providing a medium sized L PDA and L PL branches consistent with a left  dominant system.  OM1 is a medium-caliber vessel. OM2 is a large-caliber  vessel. There is no angiographic evidence of significant disease in the  LCx.   Right Coronary: The right coronary artery (RCA) is a small caliber vessel  originating from the right coronary sinus. The RCA in non dominant .There  is no angiographic evidence of significant disease in the RCA.   Echo 11/2021 Summary   1. The left ventricle is mildly to moderately dilated in size with normal  wall thickness.    2. The left ventricular systolic function is severely decreased, LVEF is  visually estimated at 20-25%.    3. There is hypokinesis of the apical anterior,apical, apical inferior and  apical septal segments.    4. There is grade III diastolic dysfunction (severely elevated filling  pressure).   5. The left atrium is moderately dilated in size.    6. The right ventricle is normal in size, with normal systolic function.    7. IVC size and inspiratory change suggest elevated right atrial pressure.  (10-20 mmHg).    Left Ventricle    The left ventricle is mildly to moderately dilated in size with normal wall  thickness.   The left ventricular systolic function is severely decreased, LVEF is  visually estimated at 20-25%.    There is hypokinesis of the apical anterior,apical, apical inferior and  apical septal segments.    There is grade III diastolic dysfunction (severely elevated filling  pressure).        Physical Exam:   VS:  BP 130/76 (BP Location: Left Arm, Patient Position: Sitting, Cuff Size: Normal)   Pulse 97   Ht 6\' 2"  (1.88 m)   Wt (!) 305 lb 12.8 oz (138.7 kg)   SpO2 98%   BMI  39.26 kg/m    Wt Readings from Last 3 Encounters:  09/28/23 (!) 305 lb 12.8 oz (138.7 kg)  07/26/23 (!) 307 lb (139.3 kg)  06/26/23 (!) 316 lb 9.6 oz (143.6 kg)    GEN: Well nourished, well developed in no acute distress NECK: No JVD; No carotid bruits CARDIAC: RRR, no murmurs, rubs, gallops RESPIRATORY:  Clear to auscultation without rales, wheezing or rhonchi  ABDOMEN: Soft, non-tender, non-distended EXTREMITIES:  No edema; No deformity   ASSESSMENT AND PLAN: .    SOB Mild PAH HFimpEF NICM Chronic respiratory failure on 2L O2 The patient reports SOB, chest pain, tiredness and LLE that improved with increasing Torsemide to 40mg  TID (from 40mg  BID). He had Flu-like symptoms 3 weeks ago, which may have  precipitated symptoms. He uses 2L O2 at all times and can increase to 3-4 as needed. He uses a CPAP at night. He is trying to lose weight with lifestyle changes.  LHC in 11/[facility] at [facility] showed no significant CAD. RHC in 2023 showed mild PAH and normal left sided filling pressures. Echo 02/2023 LVEF 55-60%, no WMA, G1DD, mild dilation of aortic root measuring 40mm. cMRI showed EF 52%, no infiltrative disease.  CPX 12/2022 showed function limited by HF, deconditioning and obesity (recommended exercise and retest). On exam, he appears euvolemic. He is on 2L O2 today. I will update an echo. I will check BMET, CBC, TSH, and BNP. Continue Torsemide 40mg  TID, Entresto 97-103mg BID, Jardiance 10mg  daily, spironolactone 12.5mg  daily, Coreg. I will increase Coreg to 25mg  BID for better HR and BP control.  He has follow-up with pulmonology later this month We will see him back in 2 months to evaluate symptoms. He is working on weight loss.  HTN BP is good today, 130/76. Increase Coreg as above. Continue Entresto, spironolactone, Jardiance and Torsemide.   Dispo: Follow-up in 2 months  Signed, Hanford Lust David Stall, PA-C

## 2023-09-28 NOTE — Patient Instructions (Signed)
Medication Instructions:  Your physician recommends the following medication changes.  INCREASE: Carvedilol (Coreg) to 25 mg by mouth twice a day  *If you need a refill on your cardiac medications before your next appointment, please call your pharmacy*   Lab Work: Your provider would like for you to have following labs drawn today (BNP, BMP, CBC, TSH).     Testing/Procedures: Your physician has requested that you have an echocardiogram. Echocardiography is a painless test that uses sound waves to create images of your heart. It provides your doctor with information about the size and shape of your heart and how well your heart's chambers and valves are working.   You may receive an ultrasound enhancing agent through an IV if needed to better visualize your heart during the echo. This procedure takes approximately one hour.  There are no restrictions for this procedure.  This will take place at [address] [facility]) [address], [place] [address]  Please note: We ask at that you not bring children with you during ultrasound (echo/ vascular) testing. Due to room size and safety concerns, children are not allowed in the ultrasound rooms during exams. Our front office staff cannot provide observation of children in our lobby area while testing is being conducted. An adult accompanying a patient to their appointment will only be allowed in the ultrasound room at the discretion of the ultrasound technician under special circumstances. We apologize for any inconvenience.    Follow-Up: At [facility], you and your health needs are our priority.  As part of our continuing mission to provide you with exceptional heart care, we have created designated Provider Care Teams.  These Care Teams include your primary Cardiologist (physician) and Advanced Practice Providers (APPs -  Physician Assistants and Nurse Practitioners) who all work together to provide you with the care you  need, when you need it.  We recommend signing up for the patient portal called "MyChart".  Sign up information is provided on this After Visit Summary.  MyChart is used to connect with patients for Virtual Visits (Telemedicine).  Patients are able to view lab/test results, encounter notes, upcoming appointments, etc.  Non-urgent messages can be sent to your provider as well.   To learn more about what you can do with MyChart, go to [email or web address].    Your next appointment:   2 month(s)  Provider:   You may see Debbe Odea, MD or one of the following Advanced Practice Providers on your designated Care Team:   Nicolasa Ducking, NP Eula Listen, PA-C Cadence Fransico Michael, PA-C Charlsie Quest, NP Carlos Levering, NP

## 2023-09-29 LAB — CBC
Hematocrit: 36.4 % — ABNORMAL LOW (ref 37.5–51.0)
Hemoglobin: 11 g/dL — ABNORMAL LOW (ref 13.0–17.7)
MCH: 23 pg — ABNORMAL LOW (ref 26.6–33.0)
MCHC: 30.2 g/dL — ABNORMAL LOW (ref 31.5–35.7)
MCV: 76 fL — ABNORMAL LOW (ref 79–97)
Platelets: 401 10*3/uL (ref 150–450)
RBC: 4.79 x10E6/uL (ref 4.14–5.80)
RDW: 13.8 % (ref 11.6–15.4)
WBC: 9.1 10*3/uL (ref 3.4–10.8)

## 2023-09-29 LAB — BASIC METABOLIC PANEL
BUN/Creatinine Ratio: 20 (ref 9–20)
BUN: 22 mg/dL (ref 6–24)
CO2: 24 mmol/L (ref 20–29)
Calcium: 9.9 mg/dL (ref 8.7–10.2)
Chloride: 100 mmol/L (ref 96–106)
Creatinine, Ser: 1.11 mg/dL (ref 0.76–1.27)
Glucose: 96 mg/dL (ref 70–99)
Potassium: 5.2 mmol/L (ref 3.5–5.2)
Sodium: 138 mmol/L (ref 134–144)
eGFR: 81 mL/min/{1.73_m2} (ref >59)

## 2023-09-29 LAB — BRAIN NATRIURETIC PEPTIDE: BNP: <2.5 pg/mL (ref 0.0–100.0)

## 2023-09-29 LAB — TSH: TSH: 0.952 u[IU]/mL (ref 0.450–4.500)

## 2023-10-03 ENCOUNTER — Ambulatory Visit (INDEPENDENT_AMBULATORY_CARE_PROVIDER_SITE_OTHER): Payer: Self-pay | Admitting: Adult Health

## 2023-10-03 ENCOUNTER — Encounter: Payer: Self-pay | Admitting: Adult Health

## 2023-10-03 DIAGNOSIS — Z87891 Personal history of nicotine dependence: Secondary | ICD-10-CM

## 2023-10-03 NOTE — Progress Notes (Signed)
  Virtual Visit via Telephone Note  I connected with [name] Gregory , 10/03/23 11:03 AM by a telemedicine application and verified that I am speaking with the correct person using two identifiers.  Location: Patient: home Provider: home   I discussed the limitations of evaluation and management by telemedicine and the availability of in person appointments. The patient expressed understanding and agreed to proceed.   Shared Decision Making Visit Lung Cancer Screening Program [id number])   Eligibility: 53 y.o. Pack Years Smoking History Calculation = 30 pack years  (# packs/per year x # years smoked) Recent History of coughing up blood  no Unexplained weight loss? no ( >Than 15 pounds within the last 6 months ) Prior History Lung / other cancer no (Diagnosis within the last 5 years already requiring surveillance chest CT Scans). Smoking Status Former Smoker Former Smokers: Years since quit: 2 years  Quit Date: 2023  Visit Components: Discussion included one or more decision making aids. YES Discussion included risk/benefits of screening. YES Discussion included potential follow up diagnostic testing for abnormal scans. YES Discussion included meaning and risk of over diagnosis. YES Discussion included meaning and risk of False Positives. YES Discussion included meaning of total radiation exposure. YES  Counseling Included: Importance of adherence to annual lung cancer LDCT screening. YES Impact of comorbidities on ability to participate in the program. YES Ability and willingness to under diagnostic treatment. YES  Smoking Cessation Counseling: Former Smokers:  Discussed the importance of maintaining cigarette abstinence. yes Diagnosis Code: Personal History of Nicotine Dependence. F62.130 Information about tobacco cessation classes and interventions provided to patient. Yes Patient provided with "ticket" for LDCT Scan. yes Written Order for Lung Cancer Screening with LDCT  placed in Epic. Yes (CT Chest Lung Cancer Screening Low Dose W/O CM) [id number]  Z12.2-Screening of respiratory organs Z87.891-Personal history of nicotine dependence   [name] 10/03/23

## 2023-10-03 NOTE — Patient Instructions (Signed)

## 2023-10-04 ENCOUNTER — Inpatient Hospital Stay: Admission: RE | Admit: 2023-10-04 | Payer: PRIVATE HEALTH INSURANCE | Source: Ambulatory Visit

## 2023-10-04 ENCOUNTER — Encounter: Payer: Self-pay | Admitting: Orthopedic Surgery

## 2023-10-04 NOTE — Telephone Encounter (Signed)
 Pt has an appt with Tammy ,NP on 10/12/23, no further action needed

## 2023-10-08 NOTE — Telephone Encounter (Signed)
 Medical and cardiology thanks

## 2023-10-09 ENCOUNTER — Ambulatory Visit
Admission: RE | Admit: 2023-10-09 | Discharge: 2023-10-09 | Disposition: A | Discharge status: Home / Self Care | Payer: PRIVATE HEALTH INSURANCE | Source: Ambulatory Visit | Attending: Acute Care | Admitting: Acute Care

## 2023-10-09 ENCOUNTER — Other Ambulatory Visit: Payer: Self-pay | Admitting: Nurse Practitioner

## 2023-10-09 DIAGNOSIS — Z87891 Personal history of nicotine dependence: Secondary | ICD-10-CM

## 2023-10-09 DIAGNOSIS — E119 Type 2 diabetes mellitus without complications: Secondary | ICD-10-CM

## 2023-10-09 DIAGNOSIS — Z122 Encounter for screening for malignant neoplasm of respiratory organs: Secondary | ICD-10-CM

## 2023-10-11 ENCOUNTER — Encounter: Payer: Self-pay | Admitting: Nurse Practitioner

## 2023-10-11 ENCOUNTER — Ambulatory Visit: Payer: PRIVATE HEALTH INSURANCE | Admitting: Nurse Practitioner

## 2023-10-11 VITALS — BP 108/68 | HR 91 | Ht 74.0 in | Wt 306.6 lb

## 2023-10-11 DIAGNOSIS — Z7984 Long term (current) use of oral hypoglycemic drugs: Secondary | ICD-10-CM | POA: Diagnosis not present

## 2023-10-11 DIAGNOSIS — E119 Type 2 diabetes mellitus without complications: Secondary | ICD-10-CM

## 2023-10-11 DIAGNOSIS — Z7985 Long-term (current) use of injectable non-insulin antidiabetic drugs: Secondary | ICD-10-CM | POA: Diagnosis not present

## 2023-10-11 LAB — POCT GLYCOSYLATED HEMOGLOBIN (HGB A1C): Hemoglobin A1C: 5.7 % — AB (ref 4.0–5.6)

## 2023-10-11 MED ORDER — TIRZEPATIDE 7.5 MG/0.5ML ~~LOC~~ SOAJ: 7.5000 mg | SUBCUTANEOUS | 1 refills | Status: DC

## 2023-10-11 NOTE — Progress Notes (Signed)
 Endocrinology Follow Up Note       10/11/2023, 10:34 AM   Subjective:    Patient ID: [name] Gregory, male    DOB: 30-Dec-1970.  [name] Gregory is being seen in follow up after being seen in consultation for management of currently uncontrolled symptomatic diabetes requested by  Alvina Filbert, MD.   Past Medical History:  Diagnosis Date   Carpal tunnel syndrome    CHF (congestive heart failure) (HCC)    Complication of anesthesia    WOKE UP DURING SURGERY   Diabetes (HCC)    Fatty liver    GERD (gastroesophageal reflux disease)    Hemangioma of liver    Hypertension    Rotator cuff disorder    Sleep apnea    URI (upper respiratory infection)     Past Surgical History:  Procedure Laterality Date   BIOPSY  05/04/2016   Procedure: BIOPSY;  Surgeon: Corbin Ade, MD;  Location: [facility];  Service: Endoscopy;;  gastric bx   CARDIAC CATHETERIZATION Bilateral 10/2021   COLONOSCOPY WITH PROPOFOL N/A 05/04/2016    PROPOFOL;  Surgeon: Corbin Ade, MD; diverticulosis throughout the entire colon, otherwise normal.  Repeat due in 2027.   ESOPHAGOGASTRODUODENOSCOPY (EGD) WITH PROPOFOL N/A 05/04/2016   PROPOFOL;  Surgeon: Corbin Ade, MD; LA grade B esophagitis, gastritis, small hiatal hernia.   EYE SURGERY     RIGHT HEART CATH Right 06/28/2022   Procedure: RIGHT HEART CATH;  Surgeon: Dolores Patty, MD;  Location: [facility];  Service: Cardiovascular;  Laterality: Right;   SALIVARY GLAND SURGERY  1992    Social History   Socioeconomic History   Marital status: Married    Spouse name: Not on file   Number of children: Not on file   Years of education: Not on file   Highest education level: Not on file  Occupational History   Occupation: administrative for [employer]  Tobacco Use   Smoking status: Former    Current packs/day: 0.00    Average packs/day: 0.3 packs/day for 36.0 years (9.0  ttl pk-yrs)    Types: Cigarettes    Start date: 11/18/1985    Quit date: 11/18/2021    Years since quitting: 1.8   Smokeless tobacco: Never   Tobacco comments:    QUIT SMOKING IN APRIL  Vaping Use   Vaping status: Every Day  Substance and Sexual Activity   Alcohol use: Not Currently    Comment: maybe once a month   Drug use: No   Sexual activity: Not on file  Other Topics Concern   Not on file  Social History Narrative   Not on file   Social Drivers of Health   Financial Resource Strain: Unknown (07/17/2019)   Overall Financial Resource Strain (CARDIA)    Difficulty of Paying Living Expenses: Patient declined  Food Insecurity: Unknown (07/17/2019)   Hunger Vital Sign    Worried About Running Out of Food in the Last Year: Patient declined    Ran Out of Food in the Last Year: Patient declined  Transportation Needs: Unknown (07/17/2019)   PRAPARE - Administrator, Civil Service (Medical): Patient declined  Lack of Transportation (Non-Medical): Patient declined  Physical Activity: Unknown (07/17/2019)   Exercise Vital Sign    Days of Exercise per Week: Patient declined    Minutes of Exercise per Session: Patient declined  Stress: Not on file  Social Connections: Unknown (07/17/2019)   Social Connection and Isolation Panel [NHANES]    Frequency of Communication with Friends and Family: Patient declined    Frequency of Social Gatherings with Friends and Family: Patient declined    Attends Religious Services: Patient declined    Database administrator or Organizations: Patient declined    Attends Banker Meetings: Patient declined    Marital Status: Patient declined    Family History  Problem Relation Age of Onset   Hypertension Mother    CVA Father    Diabetes Father    Throat cancer Father    Heart disease Brother 29   Cervical cancer Maternal Grandmother    Throat cancer Maternal Grandmother    Throat cancer Maternal Grandfather    Bladder Cancer  Maternal Grandfather    Cirrhosis Maternal Grandfather    Colon cancer Neg Hx     Outpatient Encounter Medications as of 10/11/2023  Medication Sig   albuterol (VENTOLIN HFA) 108 (90 Base) MCG/ACT inhaler Inhale 2 puffs into the lungs every 6 (six) hours as needed for wheezing or shortness of breath.   ARIPiprazole (ABILIFY) 2 MG tablet Take 2 mg by mouth daily.   atorvastatin (LIPITOR) 20 MG tablet TAKE ONE TABLET BY MOUTH EVERY DAY   buPROPion (WELLBUTRIN XL) 150 MG 24 hr tablet Take 150 mg by mouth 2 (two) times daily.   carvedilol (COREG) 25 MG tablet Take 1 tablet (25 mg total) by mouth 2 (two) times daily with a meal.   celecoxib (CELEBREX) 100 MG capsule Take 1 capsule (100 mg total) by mouth 2 (two) times daily.   cholestyramine light (PREVALITE) 4 g packet Take 1 packet by mouth 2 (two) times daily.   citalopram (CELEXA) 20 MG tablet Take 1 tablet by mouth daily.   CREON 36000-114000 units CPEP capsule Take 36,000 Units by mouth 3 (three) times daily before meals.   dicyclomine (BENTYL) 10 MG capsule Take 10 mg by mouth in the morning and at bedtime.   famotidine (PEPCID) 20 MG tablet Take 20 mg by mouth 2 (two) times daily.   JARDIANCE 10 MG TABS tablet Take 1 tablet (10 mg total) by mouth daily.   LORazepam (ATIVAN) 0.5 MG tablet Take 0.5 mg by mouth every 8 (eight) hours as needed for anxiety.    magnesium oxide (MAG-OX) 400 (240 Mg) MG tablet Take 400 mg by mouth daily.   metFORMIN (GLUCOPHAGE) 1000 MG tablet Take 1 tablet (1,000 mg total) by mouth 2 (two) times daily with a meal.   metoCLOPramide (REGLAN) 10 MG tablet Take 10 mg by mouth 3 (three) times daily.   omeprazole (PRILOSEC) 20 MG capsule Take 20 mg by mouth 2 (two) times daily before a meal.   pregabalin (LYRICA) 50 MG capsule Take 50 mg by mouth 3 (three) times daily.   sacubitril-valsartan (ENTRESTO) 97-103 MG TAKE HALF TABLET BY MOUTH TWICE A DAY.   spironolactone (ALDACTONE) 25 MG tablet Take 0.5 tablets (12.5 mg  total) by mouth daily.   tirzepatide (MOUNJARO) 7.5 MG/0.5ML Pen Inject 7.5 mg into the skin once a week.   Torsemide 40 MG TABS Take 40 mg by mouth 2 (two) times daily. (Patient taking differently: Take 40 mg by mouth  in the morning, at noon, and at bedtime.)   Vitamin D, Ergocalciferol, (DRISDOL) 50000 units CAPS capsule Take 1 capsule by mouth 3 (three) times a week.   [DISCONTINUED] glipiZIDE (GLUCOTROL XL) 5 MG 24 hr tablet TAKE ONE TABLET BY MOUTH EVERY DAY WITH WITH BREAKFAST   [DISCONTINUED] OZEMPIC, 2 MG/DOSE, 8 MG/3ML SOPN INJECT 2MG  SUBCUTANEOUSLY ONCE WEEKLY   PEG 3350-KCl-NaBcb-NaCl-NaSulf (PEG-3350/ELECTROLYTES) 236 g SOLR Take by mouth as directed. (Patient not taking: Reported on 10/11/2023)   [DISCONTINUED] OZEMPIC, 2 MG/DOSE, 8 MG/3ML SOPN INJECT 2MG  SUBCUTANEOUSLY ONCE WEEKLY   No facility-administered encounter medications on file as of 10/11/2023.    ALLERGIES: No Known Allergies  VACCINATION STATUS: Immunization History  Administered Date(s) Administered   Influenza Split 05/29/2006, 05/14/2016   Influenza,inj,Quad PF,6+ Mos 06/24/2019, 07/01/2020   Influenza-Unspecified 06/13/2011   Moderna Sars-Covid-2 Vaccination 11/12/2019, 12/10/2019   Pneumococcal-Unspecified 06/13/2011   Td 02/04/1991   Tdap 05/10/1996    Diabetes He presents for his follow-up diabetic visit. He has type 2 diabetes mellitus. Onset time: Diagnosed about 8 months ago at age of 78. His disease course has been improving. There are no hypoglycemic associated symptoms. Associated symptoms include fatigue and foot paresthesias. Pertinent negatives for diabetes include no polyuria. There are no hypoglycemic complications. Symptoms are improving. Diabetic complications include heart disease (CHF), nephropathy and peripheral neuropathy. Risk factors for coronary artery disease include diabetes mellitus, family history, male sex, obesity, hypertension and sedentary lifestyle. Current diabetic treatment  includes oral agent (dual therapy) (and Ozempic). He is compliant with treatment most of the time. His weight is fluctuating minimally. He is following a generally healthy diet. Meal planning includes avoidance of concentrated sweets. He has not had a previous visit with a dietitian. He rarely participates in exercise. His home blood glucose trend is fluctuating minimally. His breakfast blood glucose range is generally 90-110 mg/dl. (He presents today with his meter, no logs, inconsistent monitoring (forgot his second meter at home).  His POCT A1c today is 5.7%, improving from last visit of 6.6%.  He does note some hypoglycemia in the afternoons.) An ACE inhibitor/angiotensin II receptor blocker is being taken. He does not see a podiatrist.Eye exam is current.    Review of systems  Constitutional: + Minimally fluctuating body weight,  current Body mass index is 39.37 kg/m. , no fatigue, no subjective hyperthermia, no subjective hypothermia Eyes: no blurry vision, no xerophthalmia ENT: no sore throat, no nodules palpated in throat, no dysphagia/odynophagia, no hoarseness Cardiovascular: no chest pain, no shortness of breath, no palpitations, no leg swelling Respiratory: no cough, + shortness of breath- on portable O2 Gastrointestinal: no nausea/vomiting/diarrhea Musculoskeletal: bilateral shoulder pain Skin: no rashes, no hyperemia Neurological: no tremors, no numbness, no tingling, no dizziness Psychiatric: no depression, no anxiety  Objective:     BP 108/68 (BP Location: Left Arm, Patient Position: Sitting, Cuff Size: Large)   Pulse 91   Ht 6\' 2"  (1.88 m)   Wt (!) 306 lb 9.6 oz (139.1 kg)   BMI 39.37 kg/m   Wt Readings from Last 3 Encounters:  10/11/23 (!) 306 lb 9.6 oz (139.1 kg)  09/28/23 (!) 305 lb 12.8 oz (138.7 kg)  07/26/23 (!) 307 lb (139.3 kg)     BP Readings from Last 3 Encounters:  10/11/23 108/68  09/28/23 130/76  07/26/23 101/77      Physical Exam-  Limited  Constitutional:  Body mass index is 39.37 kg/m. , not in acute distress, normal state of mind Eyes:  EOMI,  no exophthalmos Musculoskeletal: no gross deformities, strength intact in all four extremities, no gross restriction of joint movements Skin:  no rashes, no hyperemia Neurological: no tremor with outstretched hands    CMP ( most recent) CMP     Component Value Date/Time   NA 138 09/28/2023 1212   K 5.2 09/28/2023 1212   CL 100 09/28/2023 1212   CO2 24 09/28/2023 1212   GLUCOSE 96 09/28/2023 1212   GLUCOSE 165 (H) 02/13/2023 1228   BUN 22 09/28/2023 1212   CREATININE 1.11 09/28/2023 1212   CREATININE 0.72 03/18/2016 1352   CALCIUM 9.9 09/28/2023 1212   PROT 7.0 07/17/2022 1232   ALBUMIN 3.8 07/17/2022 1232   AST 14 (L) 07/17/2022 1232   ALT 15 07/17/2022 1232   ALKPHOS 105 07/17/2022 1232   BILITOT 0.8 07/17/2022 1232   GFRNONAA >60 02/13/2023 1228   GFRNONAA >89 03/18/2016 1352   GFRAA >60 07/18/2019 0430   GFRAA >89 03/18/2016 1352     Diabetic Labs (most recent): Lab Results  Component Value Date   HGBA1C 5.7 (A) 10/11/2023   HGBA1C 6.6 (A) 02/20/2023   HGBA1C 6.8 (A) 10/10/2022     Lipid Panel ( most recent) Lipid Panel     Component Value Date/Time   CHOL 121 06/26/2023 1038   TRIG 148 06/26/2023 1038   HDL 40 06/26/2023 1038   CHOLHDL 3.0 06/26/2023 1038   CHOLHDL 5.0 12/16/2008 0340   VLDL 41 (H) 12/16/2008 0340   LDLCALC 55 06/26/2023 1038   LABVLDL 26 06/26/2023 1038             Assessment & Plan:   1) Type 2 diabetes mellitus without complication, without long-term current use of insulin (HCC)  He presents today with his meter, no logs, inconsistent monitoring (forgot his second meter at home).  His POCT A1c today is 5.7%, improving from last visit of 6.6%.  He does note some hypoglycemia in the afternoons.  [name] Gregory has currently uncontrolled symptomatic type 2 DM since 53 years of age.   -Recent labs  reviewed.  - I had a long discussion with him about the progressive nature of diabetes and the pathology behind its complications. -his diabetes is complicated by CHF, neuropathy and he remains at a high risk for more acute and chronic complications which include CAD, CVA, CKD, retinopathy, and neuropathy. These are all discussed in detail with him.  The following Lifestyle Medicine recommendations according to American College of Lifestyle Medicine [facility]) were discussed and offered to patient and he agrees to start the journey:  A. Whole Foods, Plant-based plate comprising of fruits and vegetables, plant-based proteins, whole-grain carbohydrates was discussed in detail with the patient.   A list for source of those nutrients were also provided to the patient.  Patient will use only water or unsweetened tea for hydration. B.  The need to stay away from risky substances including alcohol, smoking; obtaining 7 to 9 hours of restorative sleep, at least 150 minutes of moderate intensity exercise weekly, the importance of healthy social connections,  and stress reduction techniques were discussed. C.  A full color page of  Calorie density of various food groups per pound showing examples of each food groups was provided to the patient.  - Nutritional counseling repeated at each appointment due to patients tendency to fall back in to old habits.  - The patient admits there is a room for improvement in their diet and drink choices. -  Suggestion  is made for the patient to avoid simple carbohydrates from their diet including Cakes, Sweet Desserts / Pastries, Ice Cream, Soda (diet and regular), Sweet Tea, Candies, Chips, Cookies, Sweet Pastries, Store Bought Juices, Alcohol in Excess of 1-2 drinks a day, Artificial Sweeteners, Coffee Creamer, and "Sugar-free" Products. This will help patient to have stable blood glucose profile and potentially avoid unintended weight gain.   - I encouraged the patient to  switch to unprocessed or minimally processed complex starch and increased protein intake (animal or plant source), fruits, and vegetables.   - Patient is advised to stick to a routine mealtimes to eat 3 meals a day and avoid unnecessary snacks (to snack only to correct hypoglycemia).  - I have approached him with the following individualized plan to manage his diabetes and patient agrees:   -He is advised to continue Metformin 1000 mg twice daily, Jardiance 10 mg po daily, and to stop his Glipizide to de-escalate his treatment plan (especially since he has had some hypoglycemia in the afternoons).  I did change him over to Mounjaro 7.5 mg SQ weekly from the Ozempic 2 mg as he feels like he has plateaued with this product.  He notes his cravings are beginning to return.   -he is encouraged to continue monitoring glucose at least once daily, before breakfast and call if glucose is less than 70 or above 300 for 3 tests in a row.  - Adjustment parameters are given to him for hypo and hyperglycemia in writing.  - Specific targets for  A1c; LDL, HDL, and Triglycerides were discussed with the patient.  2) Blood Pressure /Hypertension:  his blood pressure is controlled to target.   he is advised to continue his current medications including Demadex 40 mg po twice daily, Entresto 49-51 mg po daily, and Coreg 25 mg po twice daily.  3) Lipids/Hyperlipidemia:    Review of his recent lipid panel from 12/26/21 showed controlled LDL at 62 .  he is advised to continue Lipitor 20 mg daily at bedtime.  Side effects and precautions discussed with him.  He had blood work with PCP last week, will send over copy.  4)  Weight/Diet:  his Body mass index is 39.37 kg/m.  -  clearly complicating his diabetes care.   he is a candidate for weight loss. I discussed with him the fact that loss of 5 - 10% of his  current body weight will have the most impact on his diabetes management.  Exercise, and detailed carbohydrates  information provided  -  detailed on discharge instructions.  5) Chronic Care/Health Maintenance: -he is on ACEI/ARB and Statin medications and is encouraged to initiate and continue to follow up with Ophthalmology, Dentist, Podiatrist at least yearly or according to recommendations, and advised to stay away from smoking. I have recommended yearly flu vaccine and pneumonia vaccine at least every 5 years; moderate intensity exercise for up to 150 minutes weekly; and sleep for at least 7 hours a day.  - he is advised to maintain close follow up with Alvina Filbert, MD for primary care needs, as well as his other providers for optimal and coordinated care.     I spent  32  minutes in the care of the patient today including review of labs from CMP, Lipids, Thyroid Function, Hematology (current and previous including abstractions from other facilities); face-to-face time discussing  his blood glucose readings/logs, discussing hypoglycemia and hyperglycemia episodes and symptoms, medications doses, his options of short and long term  treatment based on the latest standards of care / guidelines;  discussion about incorporating lifestyle medicine;  and documenting the encounter. Risk reduction counseling performed per USPSTF guidelines to reduce obesity and cardiovascular risk factors.     Please refer to Patient Instructions for Blood Glucose Monitoring and Insulin/Medications Dosing Guide"  in media tab for additional information. Please  also refer to " Patient Self Inventory" in the Media  tab for reviewed elements of pertinent patient history.  [name] Gregory participated in the discussions, expressed understanding, and voiced agreement with the above plans.  All questions were answered to his satisfaction. he is encouraged to contact clinic should he have any questions or concerns prior to his return visit.     Follow up plan: - Return in about 4 months (around 02/08/2024) for Diabetes F/U with A1c in  office, No previsit labs, Bring meter and logs.  Ronny Bacon, [facility] [address] [place], [place] [address] Phone: [phone] Fax: [phone]  10/11/2023, 10:34 AM

## 2023-10-12 ENCOUNTER — Telehealth: Payer: Self-pay | Admitting: *Deleted

## 2023-10-12 ENCOUNTER — Ambulatory Visit: Payer: Self-pay | Admitting: Adult Health

## 2023-10-12 ENCOUNTER — Encounter: Payer: Self-pay | Admitting: Adult Health

## 2023-10-12 VITALS — BP 104/75 | HR 96 | Temp 96.6°F | Ht 74.0 in | Wt 307.8 lb

## 2023-10-12 DIAGNOSIS — Z87891 Personal history of nicotine dependence: Secondary | ICD-10-CM

## 2023-10-12 DIAGNOSIS — G4733 Obstructive sleep apnea (adult) (pediatric): Secondary | ICD-10-CM

## 2023-10-12 DIAGNOSIS — J9611 Chronic respiratory failure with hypoxia: Secondary | ICD-10-CM

## 2023-10-12 DIAGNOSIS — I2721 Secondary pulmonary arterial hypertension: Secondary | ICD-10-CM

## 2023-10-12 NOTE — Telephone Encounter (Signed)
 Called [facility] to get a recent download for his CPAP.  It will be faxed to [phone].  Received fax and placed on Tammy's desk for her review.  Nothing further needed.

## 2023-10-12 NOTE — Addendum Note (Signed)
 Addended by: Delrae Rend on: 10/12/2023 02:37 PM   Modules accepted: Orders

## 2023-10-12 NOTE — Assessment & Plan Note (Signed)
 Smoking history.  Continue with the lung cancer CT screening program.

## 2023-10-12 NOTE — Patient Instructions (Addendum)
 Refer to pulmonary rehab  Continue on Oxygen 2lm rest and 3l/m with activity  Continue on CPAP At bedtime   CPAP download  Work on healthy weight loss Activity as tolerated.  CT chest screening program yearly  Follow up with Dr. Vassie Loll  in 6 months and As needed

## 2023-10-12 NOTE — Progress Notes (Signed)
 @Patient  ID: [name] Gregory, male    DOB: Apr 12, 1971, 53 y.o.   MRN: [id number]  Chief Complaint  Patient presents with   Follow-up    Referring provider: Alvina Filbert, MD  HPI: 53 year old male former smoker followed for obstructive sleep apnea and chronic respiratory failure (O2 started in 12/2021 with dx of CHF)  Medical history significant for congestive heart failure, cardiomyopathy, coronary artery disease Participates in the lung cancer CT chest screening program D[facility]   TEST/EVENTS :  CPX 12/27/22: Markedly submaximal exercise testing with peak RER (0.74) not rising above resting levels. Patient only exercised 2:45    03/2017 NPSG >> severe , AHI 62/h, lowest desatn 70 %   01/2022 PFTs no airway obstruction, ratio 84, FEV1 80%, FVC 77%, 10% bronchodilator response with FEV1 improved to 89% which may have been related to effort, TLC normal, DLCO 22.6/67%  10/12/2023 Follow up: Obstructive sleep apnea and chronic respiratory failure Patient presents for 69-month follow-up.  Patient in severe obstructive sleep apnea.  Is on nocturnal CPAP.  Patient says he wears his CPAP every night and feels that he benefits from CPAP.  CPAP download has been requested.  He uses [facility].  Currently has a [employer] full facemask. Patient is a former smoker.  Has recently had CT chest through the lung cancer CT screening program.  Results are pending.  Patient is on chronic oxygen since 2023 when he was diagnosed with congestive heart failure.  He is on 2 L at rest and 3 L with activity. On 2l/m continuous 3l/m with activity .  He is followed by cardiology for congestive heart failure and cardiomyopathy.  Has a strong family history of cardiomyopathy.  2D echo in July 2024 showed improved EF at 55 to 60%, grade 1 diastolic dysfunction.  Previous right heart cath in 2023 showed mild pulmonary hypertension.  Has a low activity tolerance.  Says he is somewhat sedentary.  Had been  recommended for cardiopulmonary rehab but was declined since EF had improved on echo.  We discussed pulmonary rehab.  Patient is interested.  No Known Allergies  Immunization History  Administered Date(s) Administered   Influenza Split 05/29/2006, 05/14/2016   Influenza,inj,Quad PF,6+ Mos 06/24/2019, 07/01/2020   Influenza-Unspecified 06/13/2011   Moderna Sars-Covid-2 Vaccination 11/12/2019, 12/10/2019   Pneumococcal-Unspecified 06/13/2011   Td 02/04/1991   Tdap 05/10/1996    Past Medical History:  Diagnosis Date   Carpal tunnel syndrome    CHF (congestive heart failure) (HCC)    Complication of anesthesia    WOKE UP DURING SURGERY   Diabetes (HCC)    Fatty liver    GERD (gastroesophageal reflux disease)    Hemangioma of liver    Hypertension    Rotator cuff disorder    Sleep apnea    URI (upper respiratory infection)     Tobacco History: Social History   Tobacco Use  Smoking Status Former   Current packs/day: 0.00   Average packs/day: 0.3 packs/day for 36.0 years (9.0 ttl pk-yrs)   Types: Cigarettes   Start date: 11/18/1985   Quit date: 11/18/2021   Years since quitting: 1.8  Smokeless Tobacco Never  Tobacco Comments   QUIT SMOKING IN APRIL   Counseling given: Not Answered Tobacco comments: QUIT SMOKING IN APRIL   Outpatient Medications Prior to Visit  Medication Sig Dispense Refill   albuterol (VENTOLIN HFA) 108 (90 Base) MCG/ACT inhaler Inhale 2 puffs into the lungs every 6 (six) hours as needed for wheezing or  shortness of breath. 8 g 2   ARIPiprazole (ABILIFY) 2 MG tablet Take 2 mg by mouth daily.     atorvastatin (LIPITOR) 20 MG tablet TAKE ONE TABLET BY MOUTH EVERY DAY 90 tablet 3   buPROPion (WELLBUTRIN XL) 150 MG 24 hr tablet Take 150 mg by mouth 2 (two) times daily.     carvedilol (COREG) 25 MG tablet Take 1 tablet (25 mg total) by mouth 2 (two) times daily with a meal. 180 tablet 3   celecoxib (CELEBREX) 100 MG capsule Take 1 capsule (100 mg total) by  mouth 2 (two) times daily. 60 capsule 0   cholestyramine light (PREVALITE) 4 g packet Take 1 packet by mouth 2 (two) times daily.     citalopram (CELEXA) 20 MG tablet Take 1 tablet by mouth daily.     CREON 36000-114000 units CPEP capsule Take 36,000 Units by mouth 3 (three) times daily before meals.     dicyclomine (BENTYL) 10 MG capsule Take 10 mg by mouth in the morning and at bedtime.     famotidine (PEPCID) 20 MG tablet Take 20 mg by mouth 2 (two) times daily.     JARDIANCE 10 MG TABS tablet Take 1 tablet (10 mg total) by mouth daily. 90 tablet 3   LORazepam (ATIVAN) 0.5 MG tablet Take 0.5 mg by mouth every 8 (eight) hours as needed for anxiety.      magnesium oxide (MAG-OX) 400 (240 Mg) MG tablet Take 400 mg by mouth daily.     metFORMIN (GLUCOPHAGE) 1000 MG tablet Take 1 tablet (1,000 mg total) by mouth 2 (two) times daily with a meal. 180 tablet 3   metoCLOPramide (REGLAN) 10 MG tablet Take 10 mg by mouth 3 (three) times daily.     omeprazole (PRILOSEC) 20 MG capsule Take 20 mg by mouth 2 (two) times daily before a meal.     PEG 3350-KCl-NaBcb-NaCl-NaSulf (PEG-3350/ELECTROLYTES) 236 g SOLR Take by mouth as directed.     pregabalin (LYRICA) 50 MG capsule Take 50 mg by mouth 3 (three) times daily.     sacubitril-valsartan (ENTRESTO) 97-103 MG TAKE HALF TABLET BY MOUTH TWICE A DAY. 60 tablet 6   spironolactone (ALDACTONE) 25 MG tablet Take 0.5 tablets (12.5 mg total) by mouth daily.     tirzepatide (MOUNJARO) 7.5 MG/0.5ML Pen Inject 7.5 mg into the skin once a week. 6 mL 1   Torsemide 40 MG TABS Take 40 mg by mouth 2 (two) times daily. (Patient taking differently: Take 40 mg by mouth in the morning, at noon, and at bedtime.) 60 tablet 5   Vitamin D, Ergocalciferol, (DRISDOL) 50000 units CAPS capsule Take 1 capsule by mouth 3 (three) times a week.     No facility-administered medications prior to visit.     Review of Systems:   Constitutional:   No  weight loss, night sweats,  Fevers,  chills,  +fatigue, or  lassitude.  HEENT:   No headaches,  Difficulty swallowing,  Tooth/dental problems, or  Sore throat,                No sneezing, itching, ear ache, nasal congestion, post nasal drip,   CV:  No chest pain,  Orthopnea, PND, swelling in lower extremities, anasarca, dizziness, palpitations, syncope.   GI  No heartburn, indigestion, abdominal pain, nausea, vomiting, diarrhea, change in bowel habits, loss of appetite, bloody stools.   Resp:   No chest wall deformity  Skin: no rash or lesions.  GU: no dysuria,  change in color of urine, no urgency or frequency.  No flank pain, no hematuria   MS:  No joint pain or swelling.  No decreased range of motion.  No back pain.    Physical Exam  BP 104/75   Pulse 96   Temp (!) 96.6 F (35.9 C) (Temporal)   Ht 6\' 2"  (1.88 m)   Wt (!) 307 lb 12.8 oz (139.6 kg)   SpO2 98%   BMI 39.52 kg/m   GEN: A/Ox3; pleasant , NAD, well nourished, on oxygen   HEENT:  [place]/AT,   NOSE-clear, THROAT-clear, no lesions, no postnasal drip or exudate noted.   NECK:  Supple w/ fair ROM; no JVD; normal carotid impulses w/o bruits; no thyromegaly or nodules palpated; no lymphadenopathy.    RESP  Clear  P & A; w/o, wheezes/ rales/ or rhonchi. no accessory muscle use, no dullness to percussion  CARD:  RRR, no m/r/g, no peripheral edema, pulses intact, no cyanosis or clubbing.  GI:   Soft & nt; nml bowel sounds; no organomegaly or masses detected.   Musco: Warm bil, no deformities or joint swelling noted.   Neuro: alert, no focal deficits noted.    Skin: Warm, no lesions or rashes    Lab Results:  CBC   BNP   Imaging: No results found.  Administration History     None          Latest Ref Rng & Units 01/18/2022    2:05 PM  PFT Results  FVC-Pre L 3.73   FVC-Predicted Pre % 77   FVC-Post L 3.99   FVC-Predicted Post % 82   Pre FEV1/FVC % % 84   Post FEV1/FCV % % 86   FEV1-Pre L 3.13   FEV1-Predicted Pre % 80    FEV1-Post L 3.45   DLCO uncorrected ml/min/mmHg 22.62   DLCO UNC% % 67   DLCO corrected ml/min/mmHg 22.88   DLCO COR %Predicted % 68   DLVA Predicted % 107   TLC L 7.52   TLC % Predicted % 96   RV % Predicted % 118     No results found for: "NITRICOXIDE"      Assessment & Plan:   OSA on CPAP Severe obstructive sleep apnea on nocturnal CPAP.  Reports perceived benefit and excellent compliance.  CPAP download has been requested  Plan  Patient Instructions  Refer to pulmonary rehab  Continue on Oxygen 2lm rest and 3l/m with activity  Continue on CPAP At bedtime   CPAP download  Work on healthy weight loss Activity as tolerated.  CT chest screening program yearly  Follow up with Dr. Vassie Loll  in 6 months and As needed         Chronic respiratory failure with hypoxia (HCC) Continue on oxygen to maintain O2 saturations greater than 88 to 90%. Referred to pulmonary rehab.  Patient has a history of pulmonary artery hypertension  Hx of smoking Smoking history.  Continue with the lung cancer CT screening program.     Rubye Oaks, NP 10/12/2023

## 2023-10-12 NOTE — Assessment & Plan Note (Signed)
 Severe obstructive sleep apnea on nocturnal CPAP.  Reports perceived benefit and excellent compliance.  CPAP download has been requested  Plan  Patient Instructions  Refer to pulmonary rehab  Continue on Oxygen 2lm rest and 3l/m with activity  Continue on CPAP At bedtime   CPAP download  Work on healthy weight loss Activity as tolerated.  CT chest screening program yearly  Follow up with Dr. Vassie Loll  in 6 months and As needed

## 2023-10-12 NOTE — Telephone Encounter (Signed)
   Name: [name] Gregory  DOB: Oct 08, 1970  MRN: [id number]  Primary Cardiologist: Debbe Odea, MD  Chart reviewed as part of pre-operative protocol coverage. Because of Shedrick Sarli Carrara's past medical history and time since last visit, he will require a follow-up in-office visit in order to better assess preoperative cardiovascular risk. Pt had new symptoms of chest pressure and shortness of breath at last office visit. Follow-up was recommended.   Pre-op covering staff: - Please schedule appointment and call patient to inform them. If patient already had an upcoming appointment within acceptable timeframe, please add "pre-op clearance" to the appointment notes so provider is aware. - Please contact requesting surgeon's office via preferred method (i.e, phone, fax) to inform them of need for appointment prior to surgery.   Joylene Grapes, NP  10/12/2023, 3:52 PM

## 2023-10-12 NOTE — Telephone Encounter (Signed)
 Called patient to set up pre-op clearance in-office appt, but no answer and could not leave a vm due that vm is not set up.

## 2023-10-12 NOTE — Telephone Encounter (Signed)
   Pre-operative Risk Assessment    Patient Name: [name] Gregory  DOB: 1971-01-14 MRN: [id number]   Date of last office visit: 09/28/23 Terrilee Croak, [facility] Date of next office visit: 11/26/23 DR. AGBOR-ETANG   Request for Surgical Clearance    Procedure:  RIGHT SHOULDER ARTHROSCOPY  Date of Surgery:  Clearance TBD                                Surgeon:  DR. Burnard Bunting Surgeon's Group or Practice Name:  [facility] Phone number:  [phone] Fax number:  [phone]   Type of Clearance Requested:   - Medical ; NONE INDICATED PER CLEARANCE FORM    Type of Anesthesia:  General  & INTERSCALENE BLOCK   Additional requests/questions:    Elpidio Anis   10/12/2023, 3:42 PM

## 2023-10-12 NOTE — Assessment & Plan Note (Addendum)
 Continue on oxygen to maintain O2 saturations greater than 88 to 90%. Referred to pulmonary rehab.  Patient has a history of pulmonary artery hypertension

## 2023-10-15 NOTE — Telephone Encounter (Signed)
 Pls have him f/u after 4/14 thx

## 2023-10-15 NOTE — Telephone Encounter (Signed)
 Pt has echo 10/23/23 and then a f/u with Dr. Azucena Cecil 11/26/23. Clearance request states TBD. I will update all parties involved.

## 2023-10-16 NOTE — Telephone Encounter (Signed)
 I will update all parties involved, see notes

## 2023-10-17 ENCOUNTER — Encounter (HOSPITAL_COMMUNITY): Payer: Self-pay

## 2023-10-17 NOTE — Telephone Encounter (Signed)
 Per Dr August Saucer needs appt with him/Luke after 04/14

## 2023-10-17 NOTE — Progress Notes (Signed)
 Called patient to schedule Pulmonary rehab and he stated would like to participate in [facility] program as it is closer to his home. Referral sent to [facility].

## 2023-10-17 NOTE — Progress Notes (Signed)
 Received referral from Dr. Vassie Loll for this pt to participate in Pulmonary Rehab with the diagnosis of Pulmonary Hypertension. Clinical review of pt follow up appt on 10/12/23 Pulmonary office note. Pt appropriate for scheduling for Pulmonary rehab. Will forward to support staff for scheduling and verification of insurance eligibility/benefits with pt consent.   [name] Gregory Card[facility] and Pulmonary Rehab

## 2023-10-23 ENCOUNTER — Ambulatory Visit: Payer: PRIVATE HEALTH INSURANCE | Attending: Medical

## 2023-10-23 DIAGNOSIS — R0602 Shortness of breath: Secondary | ICD-10-CM

## 2023-10-24 LAB — ECHOCARDIOGRAM COMPLETE
AV Mean grad: 3 mmHg
AV Peak grad: 6.1 mmHg
Ao pk vel: 1.23 m/s
Area-P 1/2: 4.6 cm2
S' Lateral: 3.7 cm

## 2023-10-29 ENCOUNTER — Other Ambulatory Visit (HOSPITAL_COMMUNITY): Payer: Self-pay

## 2023-10-30 ENCOUNTER — Telehealth: Payer: Self-pay

## 2023-10-30 ENCOUNTER — Other Ambulatory Visit (HOSPITAL_COMMUNITY): Payer: Self-pay

## 2023-10-30 NOTE — Telephone Encounter (Signed)
 Pharmacy Patient Advocate Encounter   Received notification from CoverMyMeds that prior authorization for [facility] is required/requested.   Insurance verification completed.   The patient is insured through [employer] .   Per test claim: PA required; PA submitted to above mentioned insurance via CoverMyMeds Key/confirmation #/EOC [id number] Status is pending

## 2023-10-31 ENCOUNTER — Other Ambulatory Visit: Payer: Self-pay

## 2023-10-31 DIAGNOSIS — Z122 Encounter for screening for malignant neoplasm of respiratory organs: Secondary | ICD-10-CM

## 2023-10-31 DIAGNOSIS — Z87891 Personal history of nicotine dependence: Secondary | ICD-10-CM

## 2023-11-06 NOTE — Telephone Encounter (Signed)
 Pharmacy Patient Advocate Encounter  Received notification from [employer] that Prior Authorization for [name] Gregory has been APPROVED from 10/30/2023 to 10/29/2024

## 2023-11-06 NOTE — Telephone Encounter (Signed)
 Wrong office

## 2023-11-16 NOTE — Telephone Encounter (Signed)
 Patient was called and made aware.

## 2023-11-23 ENCOUNTER — Other Ambulatory Visit: Payer: Self-pay

## 2023-11-23 ENCOUNTER — Inpatient Hospital Stay: Payer: PRIVATE HEALTH INSURANCE

## 2023-11-23 ENCOUNTER — Other Ambulatory Visit: Payer: PRIVATE HEALTH INSURANCE

## 2023-11-23 ENCOUNTER — Encounter: Payer: Self-pay | Admitting: Oncology

## 2023-11-23 ENCOUNTER — Inpatient Hospital Stay: Payer: PRIVATE HEALTH INSURANCE | Attending: Oncology | Admitting: Oncology

## 2023-11-23 VITALS — BP 90/73 | HR 81 | Temp 97.8°F | Resp 17 | Ht 74.0 in | Wt 313.1 lb

## 2023-11-23 DIAGNOSIS — Z807 Family history of other malignant neoplasms of lymphoid, hematopoietic and related tissues: Secondary | ICD-10-CM | POA: Insufficient documentation

## 2023-11-23 DIAGNOSIS — D509 Iron deficiency anemia, unspecified: Secondary | ICD-10-CM

## 2023-11-23 DIAGNOSIS — Z87891 Personal history of nicotine dependence: Secondary | ICD-10-CM | POA: Diagnosis not present

## 2023-11-23 DIAGNOSIS — Z8052 Family history of malignant neoplasm of bladder: Secondary | ICD-10-CM | POA: Diagnosis not present

## 2023-11-23 DIAGNOSIS — I429 Cardiomyopathy, unspecified: Secondary | ICD-10-CM | POA: Insufficient documentation

## 2023-11-23 DIAGNOSIS — Z800 Family history of malignant neoplasm of digestive organs: Secondary | ICD-10-CM | POA: Insufficient documentation

## 2023-11-23 DIAGNOSIS — Z9981 Dependence on supplemental oxygen: Secondary | ICD-10-CM | POA: Insufficient documentation

## 2023-11-23 DIAGNOSIS — J9611 Chronic respiratory failure with hypoxia: Secondary | ICD-10-CM

## 2023-11-23 LAB — CBC WITH DIFFERENTIAL (CANCER CENTER ONLY)
Abs Immature Granulocytes: 0.03 10*3/uL (ref 0.00–0.07)
Basophils Absolute: 0.1 10*3/uL (ref 0.0–0.1)
Basophils Relative: 1 %
Eosinophils Absolute: 0.5 10*3/uL (ref 0.0–0.5)
Eosinophils Relative: 5 %
HCT: 34.3 % — ABNORMAL LOW (ref 39.0–52.0)
Hemoglobin: 10.7 g/dL — ABNORMAL LOW (ref 13.0–17.0)
Immature Granulocytes: 0 %
Lymphocytes Relative: 20 %
Lymphs Abs: 1.7 10*3/uL (ref 0.7–4.0)
MCH: 23 pg — ABNORMAL LOW (ref 26.0–34.0)
MCHC: 31.2 g/dL (ref 30.0–36.0)
MCV: 73.6 fL — ABNORMAL LOW (ref 80.0–100.0)
Monocytes Absolute: 0.7 10*3/uL (ref 0.1–1.0)
Monocytes Relative: 8 %
Neutro Abs: 5.7 10*3/uL (ref 1.7–7.7)
Neutrophils Relative %: 66 %
Platelet Count: 302 10*3/uL (ref 150–400)
RBC: 4.66 MIL/uL (ref 4.22–5.81)
RDW: 13.8 % (ref 11.5–15.5)
WBC Count: 8.7 10*3/uL (ref 4.0–10.5)
nRBC: 0 % (ref 0.0–0.2)

## 2023-11-23 LAB — CMP (CANCER CENTER ONLY)
ALT: 12 U/L (ref 0–44)
AST: 11 U/L — ABNORMAL LOW (ref 15–41)
Albumin: 4.4 g/dL (ref 3.5–5.0)
Alkaline Phosphatase: 99 U/L (ref 38–126)
Anion gap: 5 (ref 5–15)
BUN: 20 mg/dL (ref 6–20)
CO2: 30 mmol/L (ref 22–32)
Calcium: 9.1 mg/dL (ref 8.9–10.3)
Chloride: 104 mmol/L (ref 98–111)
Creatinine: 1.14 mg/dL (ref 0.61–1.24)
GFR, Estimated: >60 mL/min (ref >60)
Glucose, Bld: 93 mg/dL (ref 70–99)
Potassium: 4.4 mmol/L (ref 3.5–5.1)
Sodium: 139 mmol/L (ref 135–145)
Total Bilirubin: 0.6 mg/dL (ref 0.0–1.2)
Total Protein: 7.6 g/dL (ref 6.5–8.1)

## 2023-11-23 LAB — IRON AND IRON BINDING CAPACITY (CC-WL,HP ONLY)
Iron: 60 ug/dL (ref 45–182)
Saturation Ratios: 16 % — ABNORMAL LOW (ref 17.9–39.5)
TIBC: 372 ug/dL (ref 250–450)
UIBC: 312 ug/dL (ref 117–376)

## 2023-11-23 LAB — DIRECT ANTIGLOBULIN TEST (NOT AT ARMC)
DAT, IgG: NEGATIVE
DAT, complement: NEGATIVE

## 2023-11-23 LAB — FERRITIN: Ferritin: 186 ng/mL (ref 24–336)

## 2023-11-23 LAB — VITAMIN B12: Vitamin B-12: 226 pg/mL (ref 180–914)

## 2023-11-23 LAB — LACTATE DEHYDROGENASE: LDH: 116 U/L (ref 98–192)

## 2023-11-23 LAB — FOLATE: Folate: 8.3 ng/mL (ref >5.9)

## 2023-11-23 NOTE — Assessment & Plan Note (Addendum)
 Chronic anemia with microcytic red blood cells, persisting for several years.   Differential diagnosis includes iron deficiency anemia, sickle cell trait, and thalassemia.   Family history of sickle cell disease and multiple myeloma. No current evidence of gastrointestinal bleeding, though there was a history of melena six months ago. Low suspicion for multiple myeloma given normal renal function and calcium levels, but family history warrants testing. Consideration of alpha thalassemia as a potential cause, which may not present symptoms unless specifically tested.  Labs today showed hemoglobin of 10.7, MCV 70.6.  White count and platelet count are within normal limits.  Creatinine 1.14, calcium 9.1.  Iron studies showed mild iron deficiency with iron saturation of 16%, otherwise unremarkable.  Ferritin pending.  B12, folate, LDH within normal limits.  We will check hemoglobin electrophoresis, alpha thalassemia phenotype, SPEP, IFE, serum free light chains to rule out other possible etiologies.  Will arrange for phone call visit in 2 weeks to discuss results.  RTC in 3 months for follow-up with repeat labs.

## 2023-11-23 NOTE — Progress Notes (Signed)
 No plans to harm self and no suicidal thoughts.

## 2023-11-23 NOTE — Progress Notes (Signed)
 [facility]  HEMATOLOGY CLINIC CONSULTATION NOTE   PATIENT NAME: [name] Gregory   MR#: [id number] DOB: March 20, 1971  DATE OF SERVICE: 11/23/2023  Patient Care Team: Alvina Filbert, MD as PCP - General (Internal Medicine) Debbe Odea, MD as PCP - Cardiology (Cardiology) Jena Gauss Gerrit Friends, MD as Consulting Physician (Gastroenterology)  REASON FOR CONSULTATION/ CHIEF COMPLAINT:  Evaluation of anemia.  ASSESSMENT & PLAN:   [name] Gregory is a 53 y.o. 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    Microcytic anemia Chronic anemia with microcytic red blood cells, persisting for several years.   Differential diagnosis includes iron deficiency anemia, sickle cell trait, and thalassemia.   Family history of sickle cell disease and multiple myeloma. No current evidence of gastrointestinal bleeding, though there was a history of melena six months ago. Low suspicion for multiple myeloma given normal renal function and calcium levels, but family history warrants testing. Consideration of alpha thalassemia as a potential cause, which may not present symptoms unless specifically tested.  Labs today showed hemoglobin of 10.7, MCV 70.6.  White count and platelet count are within normal limits.  Creatinine 1.14, calcium 9.1.  Iron studies showed mild iron deficiency with iron saturation of 16%, otherwise unremarkable.  Ferritin pending.  B12, folate, LDH within normal limits.  We will check hemoglobin electrophoresis, alpha thalassemia phenotype, SPEP, IFE, serum free light chains to rule out other possible etiologies.  Will arrange for phone call visit in 2 weeks to discuss results.  RTC in 3 months for follow-up with repeat labs.   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 Family history of cardiomyopathy with early mortality. Requires continuous oxygen therapy, likely due to heart failure secondary to cardiomyopathy.    I reviewed lab results and outside records for this visit and discussed relevant results with the patient. Diagnosis, plan of care and treatment options were also discussed in detail with the patient. Opportunity provided to ask questions and answers provided to his apparent satisfaction. Provided instructions to call our clinic with any problems, questions or concerns prior to return visit. I recommended to continue follow-up with PCP and sub-specialists. He verbalized understanding and agreed with the plan. No barriers to learning was detected.  Meryl Crutch, MD [facility] - A DEPT OF [facility] [address] [place] [address] Dept: [phone] Dept Fax: [phone]  11/23/2023 3:10 PM   History of Present Illness [name] Gregory is a 53 year old male with anemia and cardiomyopathy who presents for evaluation of anemia. He was referred by his primary doctor and gastroenterologist for evaluation of anemia.  On 09/28/2023, labs showed hemoglobin of 11, hematocrit 36.4, MCV 76.  White count 9100.  Platelet count 401,000.  He was referred to [place] for further evaluation of microcytic anemia.  He has a history of anemia characterized by microcytic red blood cells, persisting for several years without improvement. He experienced dark stools indicating possible gastrointestinal bleeding about six months ago, but this has since resolved. No current blood loss, such as epistaxis or gum bleeding. He is maintaining his weight and has no issues with eating.  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 There  is a significant family history of heart  problems, including his brother who passed away at 27 and his father's sister who passed at 20. Swelling in his legs typically occurs in the evening, and he reports no chest pain beyond his usual experience.  He has a history of acid reflux, irritable bowel syndrome, and pancreatic insufficiency. His last colonoscopy last year showed diverticulosis and diverticulitis.  There is a family history of sickle cell disease in two first cousins on his mother's side and multiple myeloma in his father, who is 62 years old. He has not been tested for sickle cell trait himself.    MEDICAL HISTORY:  Past Medical History:  Diagnosis Date   Carpal tunnel syndrome    CHF (congestive heart failure) (HCC)    Complication of anesthesia    WOKE UP DURING SURGERY   Diabetes (HCC)    Fatty liver    GERD (gastroesophageal reflux disease)    Hemangioma of liver    Hypertension    Rotator cuff disorder    Sleep apnea    URI (upper respiratory infection)     SURGICAL HISTORY: Past Surgical History:  Procedure Laterality Date   BIOPSY  05/04/2016   Procedure: BIOPSY;  Surgeon: Corbin Ade, MD;  Location: [facility];  Service: Endoscopy;;  gastric bx   CARDIAC CATHETERIZATION Bilateral 10/2021   COLONOSCOPY WITH PROPOFOL N/A 05/04/2016    PROPOFOL;  Surgeon: Corbin Ade, MD; diverticulosis throughout the entire colon, otherwise normal.  Repeat due in 2027.   ESOPHAGOGASTRODUODENOSCOPY (EGD) WITH PROPOFOL N/A 05/04/2016   PROPOFOL;  Surgeon: Corbin Ade, MD; LA grade B esophagitis, gastritis, small hiatal hernia.   EYE SURGERY     RIGHT HEART CATH Right 06/28/2022   Procedure: RIGHT HEART CATH;  Surgeon: Dolores Patty, MD;  Location: [facility] INVASIVE CV LAB;  Service: Cardiovascular;  Laterality: Right;   SALIVARY GLAND SURGERY  1992    SOCIAL HISTORY: He reports that he quit smoking about 2 years ago. His smoking use included cigarettes. He started smoking about 38 years ago. He has a 9  pack-year smoking history. He has never used smokeless tobacco. He reports that he does not currently use alcohol. He reports that he does not use drugs. Social History   Socioeconomic History   Marital status: Married    Spouse name: Not on file   Number of children: Not on file   Years of education: Not on file   Highest education level: Not on file  Occupational History   Occupation: administrative for [employer]  Tobacco Use   Smoking status: Former    Current packs/day: 0.00    Average packs/day: 0.3 packs/day for 36.0 years (9.0 ttl pk-yrs)    Types: Cigarettes    Start date: 11/18/1985    Quit date: 11/18/2021    Years since quitting: 2.0   Smokeless tobacco: Never   Tobacco comments:    QUIT SMOKING IN APRIL  Vaping Use   Vaping status: Every Day  Substance and Sexual Activity   Alcohol use: Not Currently    Comment: maybe once a month   Drug use: No   Sexual activity: Not on file  Other Topics Concern   Not on file  Social History Narrative   Not on file   Social Drivers of Health   Financial Resource Strain: Unknown (07/17/2019)   Overall Financial Resource Strain (CARDIA)    Difficulty of Paying Living Expenses: Patient declined  Food Insecurity:  No Food Insecurity (11/23/2023)   Hunger Vital Sign    Worried About Running Out of Food in the Last Year: Never true    Ran Out of Food in the Last Year: Never true  Transportation Needs: No Transportation Needs (11/23/2023)   PRAPARE - Administrator, Civil Service (Medical): No    Lack of Transportation (Non-Medical): No  Physical Activity: Unknown (07/17/2019)   Exercise Vital Sign    Days of Exercise per Week: Patient declined    Minutes of Exercise per Session: Patient declined  Stress: Not on file  Social Connections: Unknown (07/17/2019)   Social Connection and Isolation Panel [NHANES]    Frequency of Communication with Friends and Family: Patient declined    Frequency of Social Gatherings with  Friends and Family: Patient declined    Attends Religious Services: Patient declined    Database administrator or Organizations: Patient declined    Attends Banker Meetings: Patient declined    Marital Status: Patient declined  Intimate Partner Violence: Not At Risk (11/23/2023)   Humiliation, Afraid, Rape, and Kick questionnaire    Fear of Current or Ex-Partner: No    Emotionally Abused: No    Physically Abused: No    Sexually Abused: No    FAMILY HISTORY: Family History  Problem Relation Age of Onset   Hypertension Mother    CVA Father    Diabetes Father    Throat cancer Father    Heart disease Brother 29   Cervical cancer Maternal Grandmother    Throat cancer Maternal Grandmother    Throat cancer Maternal Grandfather    Bladder Cancer Maternal Grandfather    Cirrhosis Maternal Grandfather    Colon cancer Neg Hx     ALLERGIES:  He has no known allergies.  MEDICATIONS:  Current Outpatient Medications  Medication Sig Dispense Refill   albuterol (VENTOLIN HFA) 108 (90 Base) MCG/ACT inhaler Inhale 2 puffs into the lungs every 6 (six) hours as needed for wheezing or shortness of breath. 8 g 2   amLODipine (NORVASC) 10 MG tablet Take 10 mg by mouth daily.     ARIPiprazole (ABILIFY) 2 MG tablet Take 2 mg by mouth daily.     atorvastatin (LIPITOR) 20 MG tablet TAKE ONE TABLET BY MOUTH EVERY DAY 90 tablet 3   buPROPion (WELLBUTRIN XL) 150 MG 24 hr tablet Take 150 mg by mouth 2 (two) times daily.     carvedilol (COREG) 25 MG tablet Take 1 tablet (25 mg total) by mouth 2 (two) times daily with a meal. 180 tablet 3   celecoxib (CELEBREX) 100 MG capsule Take 1 capsule (100 mg total) by mouth 2 (two) times daily. 60 capsule 0   citalopram (CELEXA) 20 MG tablet Take 1 tablet by mouth daily.     CREON 36000-114000 units CPEP capsule Take 36,000 Units by mouth 3 (three) times daily before meals.     dicyclomine (BENTYL) 10 MG capsule Take 10 mg by mouth in the morning and at  bedtime.     erythromycin (E-MYCIN) 250 MG tablet Take 250 mg by mouth 3 (three) times daily.     famotidine (PEPCID) 20 MG tablet Take 20 mg by mouth 2 (two) times daily.     glipiZIDE (GLUCOTROL XL) 5 MG 24 hr tablet Take 5 mg by mouth every morning.     JARDIANCE 10 MG TABS tablet Take 1 tablet (10 mg total) by mouth daily. 90 tablet 3   LORazepam (ATIVAN)  0.5 MG tablet Take 0.5 mg by mouth every 8 (eight) hours as needed for anxiety.      magnesium oxide (MAG-OX) 400 (240 Mg) MG tablet Take 400 mg by mouth daily.     metFORMIN (GLUCOPHAGE) 1000 MG tablet Take 1 tablet (1,000 mg total) by mouth 2 (two) times daily with a meal. 180 tablet 3   metoCLOPramide (REGLAN) 10 MG tablet Take 10 mg by mouth 3 (three) times daily.     omeprazole (PRILOSEC) 20 MG capsule Take 20 mg by mouth 2 (two) times daily before a meal.     ondansetron (ZOFRAN-ODT) 4 MG disintegrating tablet Take 4 mg by mouth 2 (two) times daily.     PEG 3350-KCl-NaBcb-NaCl-NaSulf (PEG-3350/ELECTROLYTES) 236 g SOLR Take by mouth as directed.     pregabalin (LYRICA) 50 MG capsule Take 50 mg by mouth 3 (three) times daily.     sacubitril-valsartan (ENTRESTO) 97-103 MG TAKE HALF TABLET BY MOUTH TWICE A DAY. 60 tablet 6   spironolactone (ALDACTONE) 25 MG tablet Take 0.5 tablets (12.5 mg total) by mouth daily.     sucralfate (CARAFATE) 1 g tablet Take 1 g by mouth 2 (two) times daily.     tirzepatide (MOUNJARO) 7.5 MG/0.5ML Pen Inject 7.5 mg into the skin once a week. 6 mL 1   Vitamin D, Ergocalciferol, (DRISDOL) 50000 units CAPS capsule Take 1 capsule by mouth 3 (three) times a week.     Torsemide 40 MG TABS Take 40 mg by mouth 2 (two) times daily. (Patient taking differently: Take 40 mg by mouth in the morning, at noon, and at bedtime.) 60 tablet 5   No current facility-administered medications for this visit.    REVIEW OF SYSTEMS:    Review of Systems - Oncology  All other pertinent systems were reviewed and were negative  except as mentioned above.  PHYSICAL EXAMINATION:    Onc Performance Status - 11/23/23 1048       ECOG Perf Status   ECOG Perf Status Ambulatory and capable of all selfcare but unable to carry out any work activities.  Up and about more than 50% of waking hours      KPS SCALE   KPS % SCORE Cares for self, unable to carry on normal activity or to do active work             Vitals:   11/23/23 1034  BP: 90/73  Pulse: 81  Resp: 17  Temp: 97.8 F (36.6 C)  SpO2: 99%   Filed Weights   11/23/23 1034  Weight: (!) 313 lb 1.6 oz (142 kg)    Physical Exam Constitutional:      General: He is not in acute distress.    Appearance: Normal appearance.  HENT:     Head: Normocephalic and atraumatic.  Eyes:     General: No scleral icterus.    Conjunctiva/sclera: Conjunctivae normal.  Cardiovascular:     Rate and Rhythm: Normal rate and regular rhythm.     Heart sounds: Normal heart sounds.  Pulmonary:     Effort: Pulmonary effort is normal.     Breath sounds: Normal breath sounds.     Comments: Wearing O2 via [place] Abdominal:     General: There is no distension.  Musculoskeletal:     Right lower leg: No edema.     Left lower leg: No edema.  Neurological:     General: No focal deficit present.     Mental Status: He is alert and  oriented to person, place, and time.  Psychiatric:        Mood and Affect: Mood normal.        Behavior: Behavior normal.        Thought Content: Thought content normal.      LABORATORY DATA:   I have reviewed the data as listed.  Results for orders placed or performed in visit on 11/23/23  Lactate dehydrogenase  Result Value Ref Range   LDH 116 98 - 192 U/L  Folate  Result Value Ref Range   Folate 8.3 >5.9 ng/mL  Vitamin B12  Result Value Ref Range   Vitamin B-12 226 180 - 914 pg/mL  Iron and Iron Binding Capacity ([facility] only)  Result Value Ref Range   Iron 60 45 - 182 ug/dL   TIBC 161 096 - 045 ug/dL   Saturation Ratios 16 (L)  17.9 - 39.5 %   UIBC 312 117 - 376 ug/dL  CMP (Cancer Center only)  Result Value Ref Range   Sodium 139 135 - 145 mmol/L   Potassium 4.4 3.5 - 5.1 mmol/L   Chloride 104 98 - 111 mmol/L   CO2 30 22 - 32 mmol/L   Glucose, Bld 93 70 - 99 mg/dL   BUN 20 6 - 20 mg/dL   Creatinine 4.09 8.11 - 1.24 mg/dL   Calcium 9.1 8.9 - 91.4 mg/dL   Total Protein 7.6 6.5 - 8.1 g/dL   Albumin 4.4 3.5 - 5.0 g/dL   AST 11 (L) 15 - 41 U/L   ALT 12 0 - 44 U/L   Alkaline Phosphatase 99 38 - 126 U/L   Total Bilirubin 0.6 0.0 - 1.2 mg/dL   GFR, Estimated >78 >29 mL/min   Anion gap 5 5 - 15  CBC with Differential (Cancer Center Only)  Result Value Ref Range   WBC Count 8.7 4.0 - 10.5 K/uL   RBC 4.66 4.22 - 5.81 MIL/uL   Hemoglobin 10.7 (L) 13.0 - 17.0 g/dL   HCT 56.2 (L) 13.0 - 86.5 %   MCV 73.6 (L) 80.0 - 100.0 fL   MCH 23.0 (L) 26.0 - 34.0 pg   MCHC 31.2 30.0 - 36.0 g/dL   RDW 78.4 69.6 - 29.5 %   Platelet Count 302 150 - 400 K/uL   nRBC 0.0 0.0 - 0.2 %   Neutrophils Relative % 66 %   Neutro Abs 5.7 1.7 - 7.7 K/uL   Lymphocytes Relative 20 %   Lymphs Abs 1.7 0.7 - 4.0 K/uL   Monocytes Relative 8 %   Monocytes Absolute 0.7 0.1 - 1.0 K/uL   Eosinophils Relative 5 %   Eosinophils Absolute 0.5 0.0 - 0.5 K/uL   Basophils Relative 1 %   Basophils Absolute 0.1 0.0 - 0.1 K/uL   Immature Granulocytes 0 %   Abs Immature Granulocytes 0.03 0.00 - 0.07 K/uL     Recent Labs    01/01/23 1200 02/13/23 1228 06/26/23 1038 09/28/23 1212 11/23/23 1146  NA 133* 135 138 138 139  K 4.3 4.1 4.4 5.2 4.4  CL 96* 98 98 100 104  CO2 27 26 25 24 30   GLUCOSE 193* 165* 146* 96 93  BUN 21* 23* 14 22 20   CREATININE 1.25* 0.94 0.93 1.11 1.14  CALCIUM 9.3 9.2 9.0 9.9 9.1  GFRNONAA >60 >60  --   --  >60  PROT  --   --   --   --  7.6  ALBUMIN  --   --   --   --  4.4  AST  --   --   --   --  11*  ALT  --   --   --   --  12  ALKPHOS  --   --   --   --  99  BILITOT  --   --   --   --  0.6     RADIOGRAPHIC  STUDIES:  No recent pertinent imaging studies available to review.  Orders Placed This Encounter  Procedures   CBC with Differential (Cancer Center Only)    Standing Status:   Future    Number of Occurrences:   1    Expiration Date:   11/22/2024   CMP (Cancer Center only)    Standing Status:   Future    Number of Occurrences:   1    Expiration Date:   11/22/2024   Iron and Iron Binding Capacity ([facility] only)    Standing Status:   Future    Number of Occurrences:   1    Expiration Date:   11/22/2024   Ferritin    Standing Status:   Future    Number of Occurrences:   1    Expiration Date:   11/22/2024   Vitamin B12    Standing Status:   Future    Number of Occurrences:   1    Expiration Date:   11/22/2024   Folate    Standing Status:   Future    Number of Occurrences:   1    Expiration Date:   11/22/2024   Haptoglobin    Standing Status:   Future    Number of Occurrences:   1    Expiration Date:   11/22/2024   Lactate dehydrogenase    Standing Status:   Future    Number of Occurrences:   1    Expiration Date:   11/22/2024   Hgb Fractionation Cascade    Standing Status:   Future    Number of Occurrences:   1    Expiration Date:   11/22/2024   Alpha-Thalassemia GenotypR    Standing Status:   Future    Number of Occurrences:   1    Expiration Date:   11/22/2024   Multiple Myeloma Panel (SPEP&IFE w/QIG)    Standing Status:   Future    Number of Occurrences:   1    Expiration Date:   11/22/2024   Kappa/lambda light chains    Standing Status:   Future    Number of Occurrences:   1    Expiration Date:   11/22/2024   Direct antiglobulin test (not at [facility])    Standing Status:   Future    Number of Occurrences:   1    Expiration Date:   11/22/2024    Future Appointments  Date Time Provider Department Center  11/26/2023 10:40 AM Debbe Odea, MD [facility] None  11/28/2023  9:30 AM Cammy Copa, MD [facility] None  12/07/2023  3:30 PM Dilon Lank, Archie Patten, MD [facility] None   12/20/2023  3:45 PM Bensimhon, Bevelyn Buckles, MD [facility] None  02/08/2024  9:30 AM Dani Gobble, NP [facility] None  02/22/2024 10:30 AM [facility] None  02/22/2024 11:00 AM Deneane Stifter, Archie Patten, MD [facility] None  03/27/2024 10:30 AM Schnier, Latina Craver, MD [facility] None     I spent a total of 55 minutes during this encounter with the patient including review of chart and various tests results, discussions about plan of care and  coordination of care plan.  This document was completed utilizing speech recognition software. Grammatical errors, random word insertions, pronoun errors, and incomplete sentences are an occasional consequence of this system due to software limitations, ambient noise, and hardware issues. Any formal questions or concerns about the content, text or information contained within the body of this dictation should be directly addressed to the provider for clarification.

## 2023-11-23 NOTE — Assessment & Plan Note (Signed)
 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 Family history of cardiomyopathy with early mortality. Requires continuous oxygen therapy, likely due to heart failure secondary to cardiomyopathy.

## 2023-11-24 LAB — HAPTOGLOBIN: Haptoglobin: 292 mg/dL (ref 23–355)

## 2023-11-26 ENCOUNTER — Ambulatory Visit: Payer: PRIVATE HEALTH INSURANCE | Admitting: Cardiology

## 2023-11-26 ENCOUNTER — Encounter: Payer: Self-pay | Admitting: Cardiology

## 2023-11-26 ENCOUNTER — Ambulatory Visit: Payer: PRIVATE HEALTH INSURANCE | Attending: Cardiology | Admitting: Cardiology

## 2023-11-26 VITALS — BP 102/68 | HR 98 | Resp 16 | Ht 74.0 in | Wt 313.4 lb

## 2023-11-26 DIAGNOSIS — I10 Essential (primary) hypertension: Secondary | ICD-10-CM | POA: Diagnosis not present

## 2023-11-26 DIAGNOSIS — Z01810 Encounter for preprocedural cardiovascular examination: Secondary | ICD-10-CM

## 2023-11-26 DIAGNOSIS — I428 Other cardiomyopathies: Secondary | ICD-10-CM | POA: Diagnosis not present

## 2023-11-26 LAB — MULTIPLE MYELOMA PANEL, SERUM
Albumin SerPl Elph-Mcnc: 3.7 g/dL (ref 2.9–4.4)
Albumin/Glob SerPl: 1.3 (ref 0.7–1.7)
Alpha 1: 0.2 g/dL (ref 0.0–0.4)
Alpha2 Glob SerPl Elph-Mcnc: 0.8 g/dL (ref 0.4–1.0)
B-Globulin SerPl Elph-Mcnc: 0.9 g/dL (ref 0.7–1.3)
Gamma Glob SerPl Elph-Mcnc: 1.1 g/dL (ref 0.4–1.8)
Globulin, Total: 2.9 g/dL (ref 2.2–3.9)
IgA: 190 mg/dL (ref 90–386)
IgG (Immunoglobin G), Serum: 1192 mg/dL (ref 603–1613)
IgM (Immunoglobulin M), Srm: 61 mg/dL (ref 20–172)
Total Protein ELP: 6.6 g/dL (ref 6.0–8.5)

## 2023-11-26 LAB — HGB FRACTIONATION CASCADE
Hgb A2: 2.6 % (ref 1.8–3.2)
Hgb A: 97.4 % (ref 96.4–98.8)
Hgb F: 0 % (ref 0.0–2.0)
Hgb S: 0 %

## 2023-11-26 LAB — KAPPA/LAMBDA LIGHT CHAINS
Kappa free light chain: 29.2 mg/L — ABNORMAL HIGH (ref 3.3–19.4)
Kappa, lambda light chain ratio: 1.2 (ref 0.26–1.65)
Lambda free light chains: 24.4 mg/L (ref 5.7–26.3)

## 2023-11-26 MED ORDER — TORSEMIDE 40 MG PO TABS: 60.0000 mg | ORAL_TABLET | Freq: Two times a day (BID) | ORAL | 1 refills | Status: DC

## 2023-11-26 NOTE — Patient Instructions (Signed)
 Medication Instructions:  A refill for torsemide 40mg  tablets -- take 1.5 tablets (60mg ) twice daily -- has been sent to your pharmacy   *If you need a refill on your cardiac medications before your next appointment, please call your pharmacy*   Follow-Up: At [facility], you and your health needs are our priority.  As part of our continuing mission to provide you with exceptional heart care, our providers are all part of one team.  This team includes your primary Cardiologist (physician) and Advanced Practice Providers or APPs (Physician Assistants and Nurse Practitioners) who all work together to provide you with the care you need, when you need it.  Your next appointment:   6 month(s)  Provider:   You may see Constancia Delton, MD or one of the following Advanced Practice Providers on your designated Care Team:   Laneta Pintos, NP Gildardo Labrador, PA-C Varney Gentleman, PA-C Cadence [place], PA-C Ronald Cockayne, NP Morey Ar, NP    We recommend signing up for the patient portal called "MyChart".  Sign up information is provided on this After Visit Summary.  MyChart is used to connect with patients for Virtual Visits (Telemedicine).  Patients are able to view lab/test results, encounter notes, upcoming appointments, etc.  Non-urgent messages can be sent to your provider as well.   To learn more about what you can do with MyChart, go to [email or web address].

## 2023-11-26 NOTE — Progress Notes (Signed)
 Cardiology Office Note:    Date:  11/26/2023   ID:  [name] Gregory, DOB December 08, 1970, MRN [id number]  PCP:  Alvina Filbert, MD   [facility] Providers Cardiologist:  Debbe Odea, MD     Referring MD: Alvina Filbert, MD   Chief Complaint  Patient presents with   Pre-op Exam    Right shoulder - Arthroscopy Dr. Dorene Grebe with [facility]    History of Present Illness:    [name] Gregory is a 53 y.o. male with a hx of NICM (initial EF 25%, last EF 55-60%), hypertension, diabetes, OSA/CPAP, former smoker who presents for preop evaluation.  Patient has a frozen shoulder bilaterally, right surgery of surgery being planned.  Echo obtained last month showing normal EF.  Compliant with medicines as prescribed.  Volume is adequately controlled with torsemide.  Currently takes torsemide 120 mg daily total.  BP adequately controlled.  Feels well, has no concerns at this time.   Prior notes Echo 10/2023 EF 55 to 60%. CMR 11/23 EF 52% Echo 7/23 EF 45 to 50% Outside records/reports from [facility] Echo 11/18/2020 EF 20 to 25% Left heart cath 11/18/2021 no evidence for CAD Brother has history of CHF, died at age 65  Past Medical History:  Diagnosis Date   Carpal tunnel syndrome    CHF (congestive heart failure) (HCC)    Complication of anesthesia    WOKE UP DURING SURGERY   Diabetes (HCC)    Fatty liver    GERD (gastroesophageal reflux disease)    Hemangioma of liver    Hypertension    Rotator cuff disorder    Sleep apnea    URI (upper respiratory infection)     Past Surgical History:  Procedure Laterality Date   BIOPSY  05/04/2016   Procedure: BIOPSY;  Surgeon: Corbin Ade, MD;  [facility];  Service: Endoscopy;;  gastric bx   CARDIAC CATHETERIZATION Bilateral 10/2021   COLONOSCOPY WITH PROPOFOL N/A 05/04/2016    PROPOFOL;  Surgeon: Corbin Ade, MD; diverticulosis throughout the entire colon, otherwise normal.  Repeat due in 2027.    ESOPHAGOGASTRODUODENOSCOPY (EGD) WITH PROPOFOL N/A 05/04/2016   PROPOFOL;  Surgeon: Corbin Ade, MD; LA grade B esophagitis, gastritis, small hiatal hernia.   EYE SURGERY     RIGHT HEART CATH Right 06/28/2022   Procedure: RIGHT HEART CATH;  Surgeon: Dolores Patty, MD;  Location: [facility] INVASIVE CV LAB;  Service: Cardiovascular;  Laterality: Right;   SALIVARY GLAND SURGERY  1992    Current Medications: Current Meds  Medication Sig   albuterol (VENTOLIN HFA) 108 (90 Base) MCG/ACT inhaler Inhale 2 puffs into the lungs every 6 (six) hours as needed for wheezing or shortness of breath.   ARIPiprazole (ABILIFY) 2 MG tablet Take 2 mg by mouth daily.   atorvastatin (LIPITOR) 20 MG tablet TAKE ONE TABLET BY MOUTH EVERY DAY   buPROPion (WELLBUTRIN XL) 150 MG 24 hr tablet Take 150 mg by mouth 2 (two) times daily.   carvedilol (COREG) 25 MG tablet Take 1 tablet (25 mg total) by mouth 2 (two) times daily with a meal.   celecoxib (CELEBREX) 100 MG capsule Take 1 capsule (100 mg total) by mouth 2 (two) times daily.   citalopram (CELEXA) 20 MG tablet Take 1 tablet by mouth daily.   CREON 36000-114000 units CPEP capsule Take 36,000 Units by mouth 3 (three) times daily before meals.   dicyclomine (BENTYL) 10 MG capsule Take 10 mg  by mouth in the morning and at bedtime.   erythromycin (E-MYCIN) 250 MG tablet Take 250 mg by mouth 3 (three) times daily.   famotidine (PEPCID) 20 MG tablet Take 20 mg by mouth 2 (two) times daily.   glipiZIDE (GLUCOTROL XL) 5 MG 24 hr tablet Take 5 mg by mouth every morning.   JARDIANCE 10 MG TABS tablet Take 1 tablet (10 mg total) by mouth daily.   LORazepam (ATIVAN) 0.5 MG tablet Take 0.5 mg by mouth every 8 (eight) hours as needed for anxiety.    magnesium oxide (MAG-OX) 400 (240 Mg) MG tablet Take 400 mg by mouth daily.   metFORMIN (GLUCOPHAGE) 1000 MG tablet Take 1 tablet (1,000 mg total) by mouth 2 (two) times daily with a meal.   metoCLOPramide (REGLAN) 10 MG tablet  Take 10 mg by mouth 3 (three) times daily.   omeprazole (PRILOSEC) 20 MG capsule Take 20 mg by mouth 2 (two) times daily before a meal.   ondansetron (ZOFRAN-ODT) 4 MG disintegrating tablet Take 4 mg by mouth 2 (two) times daily.   PEG 3350-KCl-NaBcb-NaCl-NaSulf (PEG-3350/ELECTROLYTES) 236 g SOLR Take by mouth as directed.   pregabalin (LYRICA) 50 MG capsule Take 50 mg by mouth 3 (three) times daily.   sacubitril-valsartan (ENTRESTO) 97-103 MG TAKE HALF TABLET BY MOUTH TWICE A DAY.   spironolactone (ALDACTONE) 25 MG tablet Take 0.5 tablets (12.5 mg total) by mouth daily.   sucralfate (CARAFATE) 1 g tablet Take 1 g by mouth 2 (two) times daily.   tirzepatide (MOUNJARO) 7.5 MG/0.5ML Pen Inject 7.5 mg into the skin once a week.   Vitamin D, Ergocalciferol, (DRISDOL) 50000 units CAPS capsule Take 1 capsule by mouth 3 (three) times a week.   [DISCONTINUED] Torsemide 40 MG TABS Take 40 mg by mouth 2 (two) times daily. (Patient taking differently: Take 40 mg by mouth in the morning, at noon, and at bedtime.)     Allergies:   Patient has no known allergies.   Social History   Socioeconomic History   Marital status: Married    Spouse name: Not on file   Number of children: Not on file   Years of education: Not on file   Highest education level: Not on file  Occupational History   Occupation: administrative for [employer]  Tobacco Use   Smoking status: Former    Current packs/day: 0.00    Average packs/day: 0.3 packs/day for 36.0 years (9.0 ttl pk-yrs)    Types: Cigarettes    Start date: 11/18/1985    Quit date: 11/18/2021    Years since quitting: 2.0   Smokeless tobacco: Never   Tobacco comments:    QUIT SMOKING IN APRIL  Vaping Use   Vaping status: Former   Quit date: 09/28/2023  Substance and Sexual Activity   Alcohol use: Not Currently    Comment: maybe once a month   Drug use: No   Sexual activity: Not on file  Other Topics Concern   Not on file  Social History Narrative   Not on  file   Social Drivers of Health   Financial Resource Strain: Unknown (07/17/2019)   Overall Financial Resource Strain (CARDIA)    Difficulty of Paying Living Expenses: Patient declined  Food Insecurity: No Food Insecurity (11/23/2023)   Hunger Vital Sign    Worried About Running Out of Food in the Last Year: Never true    Ran Out of Food in the Last Year: Never true  Transportation Needs: No Transportation  Needs (11/23/2023)   PRAPARE - Administrator, Civil Service (Medical): No    Lack of Transportation (Non-Medical): No  Physical Activity: Unknown (07/17/2019)   Exercise Vital Sign    Days of Exercise per Week: Patient declined    Minutes of Exercise per Session: Patient declined  Stress: Not on file  Social Connections: Unknown (07/17/2019)   Social Connection and Isolation Panel [NHANES]    Frequency of Communication with Friends and Family: Patient declined    Frequency of Social Gatherings with Friends and Family: Patient declined    Attends Religious Services: Patient declined    Database administrator or Organizations: Patient declined    Attends Engineer, structural: Patient declined    Marital Status: Patient declined     Family History: The patient's family history includes Bladder Cancer in his maternal grandfather; CVA in his father; Cervical cancer in his maternal grandmother; Cirrhosis in his maternal grandfather; Diabetes in his father; Heart disease (age of onset: 54) in his brother; Hypertension in his mother; Throat cancer in his father, maternal grandfather, and maternal grandmother. There is no history of Colon cancer.  ROS:   Please see the history of present illness.     All other systems reviewed and are negative.  EKGs/Labs/Other Studies Reviewed:    The following studies were reviewed today:       Recent Labs: 09/28/2023: BNP <2.5; TSH 0.952 11/23/2023: ALT 12; BUN 20; Creatinine 1.14; Hemoglobin 10.7; Platelet Count 302; Potassium  4.4; Sodium 139  Recent Lipid Panel    Component Value Date/Time   CHOL 121 06/26/2023 1038   TRIG 148 06/26/2023 1038   HDL 40 06/26/2023 1038   CHOLHDL 3.0 06/26/2023 1038   CHOLHDL 5.0 12/16/2008 0340   VLDL 41 (H) 12/16/2008 0340   LDLCALC 55 06/26/2023 1038     Risk Assessment/Calculations:          Physical Exam:    VS:  BP 102/68 (BP Location: Left Arm, Patient Position: Sitting, Cuff Size: Large)   Pulse 98   Resp 16   Ht 6\' 2"  (1.88 m)   Wt (!) 313 lb 6.4 oz (142.2 kg)   SpO2 99% Comment: 2L  BMI 40.24 kg/m     Wt Readings from Last 3 Encounters:  11/26/23 (!) 313 lb 6.4 oz (142.2 kg)  11/23/23 (!) 313 lb 1.6 oz (142 kg)  10/12/23 (!) 307 lb 12.8 oz (139.6 kg)     GEN:  Well nourished, well developed in no acute distress HEENT: Normal NECK: No JVD; No carotid bruits CARDIAC: RRR, no murmurs, rubs, gallops RESPIRATORY: Diminished breath sounds at bases, no wheezing ABDOMEN: Soft, non-tender, distended MUSCULOSKELETAL: trace edema; No deformity  SKIN: Warm and dry NEUROLOGIC:  Alert and oriented x 3 PSYCHIATRIC:  Normal affect   ASSESSMENT:    1. Pre-procedural cardiovascular examination   2. NICM (nonischemic cardiomyopathy) (HCC)   3. Primary hypertension    PLAN:    In order of problems listed above:  Preprocedural exam, right shoulder surgery being planned.  Okay for surgical procedure from a cardiac perspective.  Echo last month showed normal systolic function.  Patient on optimal cardiac medications. Nonischemic cardiomyopathy, etiology possibly hereditary (brother with HF at age 81).  Initial echo 20 to 25%, last echo 3/25 EF 55-60 %.  Fatigue, hypotension improved with reducing Entresto.  Continue Entresto 49-51 mg twice daily, Coreg 25 mg twice daily, Aldactone 12.5 mg daily, torsemide 60 mg twice daily,  Jardiance.   Hypertension, BP controlled.  Continue Entresto 49-51 mg twice daily, Coreg 25 mg twice daily, Aldactone 12.5 mg daily.    Follow-up in 6 months.      Medication Adjustments/Labs and Tests Ordered: Current medicines are reviewed at length with the patient today.  Concerns regarding medicines are outlined above.  No orders of the defined types were placed in this encounter.  Meds ordered this encounter  Medications   Torsemide 40 MG TABS    Sig: Take 60 mg by mouth 2 (two) times daily.    Dispense:  270 tablet    Refill:  1    Patient Instructions  Medication Instructions:  A refill for torsemide 40mg  tablets -- take 1.5 tablets (60mg ) twice daily -- has been sent to your pharmacy   *If you need a refill on your cardiac medications before your next appointment, please call your pharmacy*   Follow-Up: At [facility], you and your health needs are our priority.  As part of our continuing mission to provide you with exceptional heart care, our providers are all part of one team.  This team includes your primary Cardiologist (physician) and Advanced Practice Providers or APPs (Physician Assistants and Nurse Practitioners) who all work together to provide you with the care you need, when you need it.  Your next appointment:   6 month(s)  Provider:   You may see Constancia Delton, MD or one of the following Advanced Practice Providers on your designated Care Team:   Laneta Pintos, NP Gildardo Labrador, PA-C Varney Gentleman, PA-C Cadence Broadlands, PA-C Ronald Cockayne, NP Morey Ar, NP    We recommend signing up for the patient portal called "MyChart".  Sign up information is provided on this After Visit Summary.  MyChart is used to connect with patients for Virtual Visits (Telemedicine).  Patients are able to view lab/test results, encounter notes, upcoming appointments, etc.  Non-urgent messages can be sent to your provider as well.   To learn more about what you can do with MyChart, go to [email or web address].        Signed, Constancia Delton, MD  11/26/2023 12:23 PM    [facility]

## 2023-11-28 ENCOUNTER — Telehealth: Payer: Self-pay

## 2023-11-28 ENCOUNTER — Ambulatory Visit (INDEPENDENT_AMBULATORY_CARE_PROVIDER_SITE_OTHER): Payer: PRIVATE HEALTH INSURANCE | Admitting: Orthopedic Surgery

## 2023-11-28 DIAGNOSIS — M25511 Pain in right shoulder: Secondary | ICD-10-CM | POA: Diagnosis not present

## 2023-11-28 DIAGNOSIS — M25512 Pain in left shoulder: Secondary | ICD-10-CM

## 2023-11-28 DIAGNOSIS — G8929 Other chronic pain: Secondary | ICD-10-CM

## 2023-11-28 NOTE — Telephone Encounter (Signed)
 Sending as reminder to put referral in for PT to start 1 day post op right shoulder  Debbie to let me know once case is scheduled so I can put referral in.

## 2023-11-30 ENCOUNTER — Encounter: Payer: Self-pay | Admitting: Orthopedic Surgery

## 2023-11-30 NOTE — Progress Notes (Signed)
 Office Visit Note   Patient: [name] Gregory           Date of Birth: [birth year]           MRN: [id number] Visit Date: 11/28/2023 Requested by: Twylla Galen, MD [address] [place],  [place] [address] PCP: Twylla Galen, MD  Subjective: Chief Complaint  Patient presents with   Right Shoulder - Follow-up    HPI: [name] Gregory is a 53 y.o. male who presents to the office reporting continued right shoulder pain.  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  He has received cardiac risk stratification.  Is not really been evaluated by pulmonary yet for anesthesia recommendations.  Last A1c 5.7.[name] Gregory                ROS: All systems reviewed are negative as they relate to the chief complaint within the history of present illness.  Patient denies fevers or chills.  Assessment & Plan: Visit Diagnoses:  1. Chronic right shoulder pain   2. Left shoulder pain, unspecified chronicity     Plan: Impression is bilateral shoulder adhesive capsulitis.  Range of motion restricted on the right.  Patient is diabetic and this is longstanding.  Will be difficult to maintain any range of motion gains we get with surgery.  Surgical plan would be arthroscopy with manipulation rotator interval release and starting physical therapy the day after surgery.  He would do best with a shoulder CPM machine but does not covered by insurance so it is unlikely that that will happen.  Would be good for him not to receive an interscalene block out of concern for the phrenic nerve which if compromise could more severely affected his pulmonary function than most patients.  We will reach out to his pulmonary doctor for recommendations regarding anesthesia for this procedure.  Follow-Up Instructions: No follow-ups on file.   Orders:  No orders of the defined types were placed in this encounter.  No orders of the defined types were placed in this encounter.     Procedures: No procedures performed   Clinical  Data: No additional findings.  Objective: Vital Signs: There were no vitals taken for this visit.  Physical Exam:  Constitutional: Patient appears well-developed HEENT:  Head: Normocephalic Eyes:EOM are normal Neck: Normal range of motion Cardiovascular: Normal rate Pulmonary/chest: Effort normal Neurologic: Patient is alert Skin: Skin is warm Psychiatric: Patient has normal mood and affect  Ortho Exam: Ortho exam demonstrates range of motion on the right of 10/70/85.  Has good cervical spine range of motion.  Motor or sensory function to the right hand is intact.  Rotator cuff strength is pretty reasonable to internal/external rotation at 15 degrees of abduction.  Specialty Comments:  No specialty comments available.  Imaging: No results found.   PMFS History: Patient Active Problem List   Diagnosis Date Noted   Hx of smoking 10/12/2023   Lymphedema 03/26/2023   Bilateral sensorineural hearing loss 01/04/2023   Chronic respiratory failure with hypoxia (HCC) 01/02/2022   HTN (hypertension) 06/10/2020   Loose stools 06/09/2020   GERD (gastroesophageal reflux disease) 06/09/2020   Elevated LFTs 06/09/2020   Early satiety 06/09/2020   Fatty liver 06/09/2020   Dizziness    Hyperglycemia    Liver enzyme elevation    Nausea vomiting and diarrhea    Transaminitis    AKI (acute kidney injury) (HCC) 07/17/2019   Abdominal pain 07/17/2019   Nasal congestion 07/17/2019   Post-nasal drainage  07/17/2019   OSA on CPAP 06/14/2017   Morbid (severe) obesity due to excess calories (HCC) 06/14/2017   Abdominal pain, epigastric 04/24/2016   RUQ pain 04/24/2016   Microcytic anemia 04/24/2016   Diarrhea 04/24/2016   Elevated sed rate 04/24/2016   Splenomegaly 04/24/2016   Past Medical History:  Diagnosis Date   Carpal tunnel syndrome    CHF (congestive heart failure) (HCC)    Complication of anesthesia    WOKE UP DURING SURGERY   Diabetes (HCC)    Fatty liver    GERD  (gastroesophageal reflux disease)    Hemangioma of liver    Hypertension    Rotator cuff disorder    Sleep apnea    URI (upper respiratory infection)     Family History  Problem Relation Age of Onset   Hypertension Mother    CVA Father    Diabetes Father    Throat cancer Father    Heart disease Brother 70   Cervical cancer Maternal Grandmother    Throat cancer Maternal Grandmother    Throat cancer Maternal Grandfather    Bladder Cancer Maternal Grandfather    Cirrhosis Maternal Grandfather    Colon cancer Neg Hx     Past Surgical History:  Procedure Laterality Date   BIOPSY  05/04/2016   Procedure: BIOPSY;  Surgeon: Suzette Espy, MD;  [facility];  Service: Endoscopy;;  gastric bx   CARDIAC CATHETERIZATION Bilateral 10/2021   COLONOSCOPY WITH PROPOFOL  N/A 05/04/2016    PROPOFOL ;  Surgeon: Suzette Espy, MD; diverticulosis throughout the entire colon, otherwise normal.  Repeat due in 2027.   ESOPHAGOGASTRODUODENOSCOPY (EGD) WITH PROPOFOL  N/A 05/04/2016   PROPOFOL ;  Surgeon: Suzette Espy, MD; LA grade B esophagitis, gastritis, small hiatal hernia.   EYE SURGERY     RIGHT HEART CATH Right 06/28/2022   Procedure: RIGHT HEART CATH;  Surgeon: Mardell Shade, MD;  Location: [facility] INVASIVE CV LAB;  Service: Cardiovascular;  Laterality: Right;   SALIVARY GLAND SURGERY  1992   Social History   Occupational History   Occupation: administrative for rest [employer]  Tobacco Use   Smoking status: Former    Current packs/day: 0.00    Average packs/day: 0.3 packs/day for 36.0 years (9.0 ttl pk-yrs)    Types: Cigarettes    Start date: 11/18/1985    Quit date: 11/18/2021    Years since quitting: 2.0   Smokeless tobacco: Never   Tobacco comments:    QUIT SMOKING IN APRIL  Vaping Use   Vaping status: Former   Quit date: 09/28/2023  Substance and Sexual Activity   Alcohol use: Not Currently    Comment: maybe once a month   Drug use: No   Sexual activity: Not on file

## 2023-12-03 ENCOUNTER — Telehealth: Payer: Self-pay | Admitting: Cardiology

## 2023-12-03 NOTE — Telephone Encounter (Signed)
*  STAT* If patient is at the pharmacy, call can be transferred to refill team.   1. Which medications need to be refilled? (please list name of each medication and dose if known)  sacubitril-valsartan  (ENTRESTO ) 97-103 MG  2. Which pharmacy/location (including street and city if local pharmacy) is medication to be sent to? [facility], [place] - [place], [place] - [address] Phone: [phone]  Fax: [phone]     3. Do they need a 30 day or 90 day supply? 90

## 2023-12-04 NOTE — Telephone Encounter (Signed)
 Please advise. The patient is requesting refills of Entresto . According to Dr. Mearl Spice last office note, it states for the patient to take 49-51 mg twice a day, but the med list is listing that the patient is taking Entresto  97-103 mg.

## 2023-12-04 NOTE — Telephone Encounter (Signed)
 This was changed. See Dr. Junnie Olives office visit note. Refill 49-51 mg dose

## 2023-12-05 MED ORDER — ENTRESTO 49-51 MG PO TABS: 1.0000 | ORAL_TABLET | Freq: Two times a day (BID) | ORAL | 5 refills | Status: DC

## 2023-12-05 NOTE — Telephone Encounter (Signed)
 Call the patient's telephone number that is listed on file. Spoke with the patient's wife, whom the patient authorized to give information to, about the patient's recent refill request in regards to Entresto . According to Dr. Amber Juniper last office note, he states for the patient to take the 49-51 mg dosage of Entresto  twice a day. The patient's medication list states for the patient to take half the 97-103 mg dosage twice a day. I informed the patient's wife that the patient does not need to cut the 49-51 mg dosage in half. Wife verbalized understanding.

## 2023-12-06 LAB — ALPHA-THALASSEMIA GENOTYPR

## 2023-12-07 ENCOUNTER — Inpatient Hospital Stay (HOSPITAL_BASED_OUTPATIENT_CLINIC_OR_DEPARTMENT_OTHER): Payer: PRIVATE HEALTH INSURANCE | Admitting: Oncology

## 2023-12-07 ENCOUNTER — Encounter: Payer: Self-pay | Admitting: Oncology

## 2023-12-07 DIAGNOSIS — D509 Iron deficiency anemia, unspecified: Secondary | ICD-10-CM | POA: Diagnosis not present

## 2023-12-07 MED ORDER — FERROUS SULFATE 325 (65 FE) MG PO TBEC: 325.0000 mg | DELAYED_RELEASE_TABLET | Freq: Every day | ORAL | 3 refills | Status: DC

## 2023-12-07 NOTE — Assessment & Plan Note (Addendum)
 Chronic anemia with microcytic red blood cells, persisting for several years.   Family history of sickle cell disease and multiple myeloma.   On his initial consultation with us  on 11/23/2023, labs showed hemoglobin of 10.7, MCV 70.6.  White count and platelet count were within normal limits.  Creatinine 1.14, calcium  9.1.  Iron studies showed mild iron deficiency with iron saturation of 16%, otherwise unremarkable.  Ferritin normal at 186.  B12, folate, LDH, haptoglobin were all within normal limits.  Coombs test negative.  SPEP showed no evidence of M spike.  IFE was unremarkable.  Kappa was slightly increased at 29.2 mg/L, lambda was normal at 24.4 mg/L, ratio normal at 1.2.  Overall no evidence of monoclonal gammopathy.  Hemoglobin electrophoresis showed normal hemoglobin pattern.  No evidence of beta thalassemia.   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   Alpha thalassemia minor explains chronic microcytic anemia.  No intervention needed at current hemoglobin levels.  Given mild iron deficiency, he was advised to start taking oral iron supplements once daily.  Prescription sent to his pharmacy.  RTC in 3 months for follow-up with repeat labs.

## 2023-12-07 NOTE — Progress Notes (Signed)
 [facility]  HEMATOLOGY-ONCOLOGY ELECTRONIC VISIT PROGRESS NOTE  PATIENT NAME: [name] Gregory   MR#: [id number] DOB: 1970/09/22  DATE OF SERVICE: 12/07/2023  Patient Care Team: Twylla Galen, MD as PCP - General (Internal Medicine) Constancia Delton, MD as PCP - Cardiology (Cardiology) Riley Cheadle Windsor Hatcher, MD as Consulting Physician (Gastroenterology)  I connected with the patient via telephone conference and verified that I am speaking with the correct person using two identifiers. The patient's location is at home and I am providing care from the [facility].  I discussed the limitations, risks, security and privacy concerns of performing an evaluation and management service by e-visits and the availability of in person appointments. I also discussed with the patient that there may be a patient responsible charge related to this service. The patient expressed understanding and agreed to proceed.   ASSESSMENT & PLAN:   [name] Gregory is a 53 y.o. 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    Microcytic anemia Chronic anemia with microcytic red blood cells, persisting for several years.   Family history of sickle cell disease and multiple myeloma.   On his initial consultation with us  on 11/23/2023, labs showed hemoglobin of 10.7, MCV 70.6.  White count and platelet count were within normal limits.  Creatinine 1.14, calcium  9.1.  Iron studies showed mild iron deficiency with iron saturation of 16%, otherwise unremarkable.  Ferritin normal at 186.  B12, folate, LDH, haptoglobin were all within normal limits.  Coombs test negative.  SPEP showed no evidence of M spike.  IFE was unremarkable.  Kappa was slightly increased at 29.2 mg/L, lambda was normal at  24.4 mg/L, ratio normal at 1.2.  Overall no evidence of monoclonal gammopathy.  Hemoglobin electrophoresis showed normal hemoglobin pattern.  No evidence of beta thalassemia.   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   Alpha thalassemia minor explains chronic microcytic anemia.  No intervention needed at current hemoglobin levels.  RTC in 3 months for follow-up with repeat labs.  I discussed the assessment and treatment plan with the patient. The patient was provided an opportunity to ask questions and all were answered. The patient agreed with the plan and demonstrated an understanding of the instructions. The patient was advised to call back or seek an in-person evaluation if the symptoms worsen or if the condition fails to improve as anticipated.    I spent 12 minutes over the phone with the patient reviewing test results, discuss management and coordination/planning of care.  Arlo Berber, MD 12/07/2023 6:00 PM [facility] - A DE[facility] [address] [place] [address] Dept: [phone] Dept Fax: [phone]   INTERVAL HISTORY:  Please see above for problem oriented charting.  The purpose of today's discussion is to explain recent lab results and to formulate plan of care.  Discussed the use of AI scribe software for clinical note transcription with the patient, who gave verbal consent to proceed.  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  He has a history of chronic anemia characterized by microcytic red blood cells. Recent evaluation showed a hemoglobin level of 10.7,  indicating mild anemia. Further workup confirmed a diagnosis of alpha thalassemia minor, a genetic condition leading to defective red blood cell production and chronic anemia.  He is not  currently taking any iron supplements. Previous tests for vitamin B12 and folic acid were normal, ruling out deficiencies in these areas as a cause for his anemia.  There was a concern about an abnormal light chain result, which was mildly elevated. Further testing ruled out conditions such as myeloma. The levels were not high enough to be concerning at this time.   SUMMARY OF HEMATOLOGY HISTORY:  On 09/28/2023, labs showed hemoglobin of 11, hematocrit 36.4, MCV 76.  White count 9100.  Platelet count 401,000.  He was referred to us  for further evaluation of microcytic anemia.   He has a history of anemia characterized by microcytic red blood cells, persisting for several years without improvement. He experienced dark stools indicating possible gastrointestinal bleeding about six months ago, but this has since resolved. No current blood loss, such as epistaxis or gum bleeding. He is maintaining his weight and has no issues with eating.   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 There is a significant family history of heart problems, including his brother who passed away at 56 and his father's sister who passed at 5. Swelling in his legs typically occurs in the evening, and he reports no chest pain beyond his usual experience.   He has a history of acid reflux, irritable bowel syndrome, and pancreatic insufficiency. His last colonoscopy last year showed diverticulosis and diverticulitis.   There is a family history of sickle cell disease in two first cousins on his mother's side and multiple myeloma in his father, who is 12 years old.   On his initial consultation with us  on 11/23/2023, labs showed hemoglobin of 10.7, MCV 70.6.  White count and platelet count were within normal limits.  Creatinine 1.14, calcium  9.1.  Iron studies showed mild iron deficiency with iron saturation of 16%, otherwise unremarkable.  Ferritin normal at  186.  B12, folate, LDH, haptoglobin were all within normal limits.  Coombs test negative.  SPEP showed no evidence of M spike.  IFE was unremarkable.  Kappa was slightly increased at 29.2 mg/L, lambda was normal at 24.4 mg/L, ratio normal at 1.2.  Overall no evidence of monoclonal gammopathy.  Hemoglobin electrophoresis showed normal hemoglobin pattern.  No evidence of beta thalassemia.   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   Alpha thalassemia minor explains chronic microcytic anemia.  No intervention needed at current hemoglobin levels.  REVIEW OF SYSTEMS:    Review of Systems - Oncology  All other pertinent systems were reviewed with the patient and are negative.  I have reviewed the past medical history, past surgical history, social history and family history with the patient and they are unchanged from previous note.  ALLERGIES:  He has no known allergies.  MEDICATIONS:  Current Outpatient Medications  Medication Sig Dispense Refill   ferrous sulfate 325 (65 FE) MG EC tablet Take 1 tablet (325 mg total) by mouth daily with breakfast. 30 tablet 3   albuterol  (VENTOLIN  HFA) 108 (90 Base) MCG/ACT inhaler Inhale 2 puffs into the lungs every 6 (six) hours as needed for wheezing or shortness of breath. 8 g 2   ARIPiprazole (ABILIFY) 2 MG tablet Take 2 mg by mouth daily.     atorvastatin  (LIPITOR) 20 MG tablet TAKE ONE TABLET  BY MOUTH EVERY DAY 90 tablet 3   buPROPion  (WELLBUTRIN  XL) 150 MG 24 hr tablet Take 150 mg by mouth 2 (two) times daily.     carvedilol  (COREG ) 25 MG tablet Take 1 tablet (25 mg total) by mouth 2 (two) times daily with a meal. 180 tablet 3   celecoxib  (CELEBREX ) 100 MG capsule Take 1 capsule (100 mg total) by mouth 2 (two) times daily. 60 capsule 0   citalopram  (CELEXA ) 20 MG tablet Take 1 tablet by mouth daily.     CREON 36000-114000 units CPEP capsule Take 36,000 Units by mouth 3 (three) times daily before  meals.     dicyclomine (BENTYL) 10 MG capsule Take 10 mg by mouth in the morning and at bedtime.     erythromycin (E-MYCIN) 250 MG tablet Take 250 mg by mouth 3 (three) times daily.     famotidine (PEPCID) 20 MG tablet Take 20 mg by mouth 2 (two) times daily.     glipiZIDE  (GLUCOTROL  XL) 5 MG 24 hr tablet Take 5 mg by mouth every morning.     JARDIANCE  10 MG TABS tablet Take 1 tablet (10 mg total) by mouth daily. 90 tablet 3   LORazepam  (ATIVAN ) 0.5 MG tablet Take 0.5 mg by mouth every 8 (eight) hours as needed for anxiety.      magnesium oxide (MAG-OX) 400 (240 Mg) MG tablet Take 400 mg by mouth daily.     metFORMIN  (GLUCOPHAGE ) 1000 MG tablet Take 1 tablet (1,000 mg total) by mouth 2 (two) times daily with a meal. 180 tablet 3   metoCLOPramide  (REGLAN ) 10 MG tablet Take 10 mg by mouth 3 (three) times daily.     omeprazole (PRILOSEC) 20 MG capsule Take 20 mg by mouth 2 (two) times daily before a meal.     ondansetron  (ZOFRAN -ODT) 4 MG disintegrating tablet Take 4 mg by mouth 2 (two) times daily.     PEG 3350-KCl-NaBcb-NaCl-NaSulf (PEG-3350/ELECTROLYTES) 236 g SOLR Take by mouth as directed.     pregabalin (LYRICA) 50 MG capsule Take 50 mg by mouth 3 (three) times daily.     sacubitril-valsartan  (ENTRESTO ) 49-51 MG Take 1 tablet by mouth 2 (two) times daily. 60 tablet 5   spironolactone  (ALDACTONE ) 25 MG tablet Take 0.5 tablets (12.5 mg total) by mouth daily.     sucralfate (CARAFATE) 1 g tablet Take 1 g by mouth 2 (two) times daily.     tirzepatide (MOUNJARO) 7.5 MG/0.5ML Pen Inject 7.5 mg into the skin once a week. 6 mL 1   Torsemide  40 MG TABS Take 60 mg by mouth 2 (two) times daily. 270 tablet 1   Vitamin D , Ergocalciferol , (DRISDOL) 50000 units CAPS capsule Take 1 capsule by mouth 3 (three) times a week.     No current facility-administered medications for this visit.    PHYSICAL EXAMINATION:   Onc Performance Status - 12/07/23 1700       ECOG Perf Status   ECOG Perf Status  Ambulatory and capable of all selfcare but unable to carry out any work activities.  Up and about more than 50% of waking hours      KPS SCALE   KPS % SCORE Normal activity with effort, some s/s of disease             LABORATORY DATA:   I have reviewed the data as listed.  Recent Results (from the past 2160 hours)  Brain natriuretic peptide     Status: None   Collection  Time: 09/28/23 12:12 PM  Result Value Ref Range   BNP <2.5 0.0 - 100.0 pg/mL    Comment: Siemens ADVIA Centaur XP methodology  Basic metabolic panel     Status: None   Collection Time: 09/28/23 12:12 PM  Result Value Ref Range   Glucose 96 70 - 99 mg/dL   BUN 22 6 - 24 mg/dL   Creatinine, Ser 2.95 0.76 - 1.27 mg/dL   eGFR 81 >62 ZH/YQM/5.78   BUN/Creatinine Ratio 20 9 - 20   Sodium 138 134 - 144 mmol/L   Potassium 5.2 3.5 - 5.2 mmol/L   Chloride 100 96 - 106 mmol/L   CO2 24 20 - 29 mmol/L   Calcium  9.9 8.7 - 10.2 mg/dL  TSH     Status: None   Collection Time: 09/28/23 12:12 PM  Result Value Ref Range   TSH 0.952 0.450 - 4.500 uIU/mL  CBC     Status: Abnormal   Collection Time: 09/28/23 12:12 PM  Result Value Ref Range   WBC 9.1 3.4 - 10.8 x10E3/uL   RBC 4.79 4.14 - 5.80 x10E6/uL   Hemoglobin 11.0 (L) 13.0 - 17.7 g/dL   Hematocrit 46.9 (L) 62.9 - 51.0 %   MCV 76 (L) 79 - 97 fL   MCH 23.0 (L) 26.6 - 33.0 pg   MCHC 30.2 (L) 31.5 - 35.7 g/dL   RDW 52.8 41.3 - 24.4 %   Platelets 401 150 - 450 x10E3/uL  HgB A1c     Status: Abnormal   Collection Time: 10/11/23 10:28 AM  Result Value Ref Range   Hemoglobin A1C 5.7 (A) 4.0 - 5.6 %   HbA1c POC (<> result, manual entry)     HbA1c, POC (prediabetic range)     HbA1c, POC (controlled diabetic range)    ECHOCARDIOGRAM COMPLETE     Status: None   Collection Time: 10/23/23  1:40 PM  Result Value Ref Range   AV Peak grad 6.1 mmHg   Ao pk vel 1.23 m/s   S' Lateral 3.70 cm   Area-P 1/2 4.60 cm2   AV Mean grad 3.0 mmHg   Est EF 55 - 60%   Kappa/lambda  light chains     Status: Abnormal   Collection Time: 11/23/23 11:46 AM  Result Value Ref Range   Kappa free light chain 29.2 (H) 3.3 - 19.4 mg/L   Lambda free light chains 24.4 5.7 - 26.3 mg/L   Kappa, lambda light chain ratio 1.20 0.26 - 1.65    Comment: (NOTE) Performed At: [facility] [place] [address] [place], [place] [id number] Pearlean Botts MD UY:[id number]   Multiple Myeloma Panel (SPEP&IFE w/QIG)     Status: None   Collection Time: 11/23/23 11:46 AM  Result Value Ref Range   IgG (Immunoglobin G), Serum 1,192 603 - 1,613 mg/dL   IgA 638 90 - 756 mg/dL   IgM (Immunoglobulin M), Srm 61 20 - 172 mg/dL   Total Protein ELP 6.6 6.0 - 8.5 g/dL   Albumin SerPl Elph-Mcnc 3.7 2.9 - 4.4 g/dL   Alpha 1 0.2 0.0 - 0.4 g/dL   Alpha2 Glob SerPl Elph-Mcnc 0.8 0.4 - 1.0 g/dL   B-Globulin SerPl Elph-Mcnc 0.9 0.7 - 1.3 g/dL   Gamma Glob SerPl Elph-Mcnc 1.1 0.4 - 1.8 g/dL   M Protein SerPl Elph-Mcnc Not Observed Not Observed g/dL   Globulin, Total 2.9 2.2 - 3.9 g/dL   Albumin/Glob SerPl 1.3 0.7 - 1.7   IFE 1 Comment  Comment: (NOTE) The immunofixation pattern appears unremarkable. Evidence of monoclonal protein is not apparent.    Please Note Comment     Comment: (NOTE) Protein electrophoresis scan will follow via computer, mail, or courier delivery. Performed At: [facility] [address] [place], [place] [id number] Pearlean Botts MD WU:[id number]   Alpha-Thalassemia GenotypR     Status: None   Collection Time: 11/23/23 11:46 AM  Result Value Ref Range   Alpha-Thalassemia Comment:     Comment: (NOTE) Test: Alpha-Thalassemia, DNA Analysis Result:     Alpha-thalassemia minor, alpha-/alpha- Mutation(s) identified:  alpha3.7 and alpha3.7 Interpretation: This result is most consistent with this individual being an unaffected carrier of alpha-thalassemia with two of the four alpha-globin genes deleted (Alpha-thalassemia minor). The two remaining alpha-globin genes  are arranged in trans, one copy located on each chromosome 16 homolog. Individuals with alpha-thalassemia minor can demonstrate microcytosis, hypocromia, and normal levels of HbA2 and HbF. However, they typically have no significant clinical findings. Additionally, this individual is negative for the Hemoglobin Constant Spring mutation. The presence of other non-deletional mutations, like point mutations, in the alpha-globin gene cannot be excluded as the assay does not detect these changes.  Co-inheritance of alpha-thalassemia and other hemoglobinopathies, such as beta-thalassemia and sickle cell dis ease is possible.  Genetic counseling and hemoglobinopathy testing are recommended for this individual's reproductive partner and family members. Alpha-thalassemia is an autosomal recessive condition that results from a deletion or dysfunction of alpha-globin chains. There are four genes that make the alpha-globin chain, which binds with the beta-globin chain to create the alpha alpha/beta beta hemoglobin tetramer. An imbalance of alpha and beta chains can lead to disease, ranging from mild anemia to fatal hydrops fetalis. Hemoglobin Constant Spring is a non-deletional alpha-thalassemia mutation that is more common in [place]. Hemoglobin Constant Spring is caused by a mutation in the stop codon of the alpha(2)-globin gene that results in poor alpha-globin production. The assay detects over 90% of alpha-globin deletions depending on an individual's ethnic background. The analysis does not detect mutations in the beta-globin gene, such as beta-thalassemia or sick le cell disease. Diagnosis of alpha-thalassemia should not rely on DNA testing alone, but should take into consideration clinical symptoms and other test results, such as hemoglobin fractionation testing. Genetic counselors are available for health care providers to discuss this result further at  [phone]. Methodology: DNA analysis of the alpha-globin locus (HBA1/HBA2, OMIM 141800 / 141850, 16pter-16p13.3) is performed by multiplex ligation-dependent probe amplification (MLPA). This methodology detects copy number changes by targeting 24 different sequences in the alpha-globin region, and will, in principle, detect all genomic deletions and duplications involving this locus, as well as the Constant Spring point mutation. DNA analysis of the alpha-globin gene deletions performed by multiplex polymerase chain reaction (PCR) followed by agarose gel electrophoresis may on occasion be used for confirmatory purpose. The mutations analyzed in this manner are alpha3.7,  alpha4.2, Southeast Asian type (--SEA), Filipino type (--FIL), [place] type(--THAI), Mediterranean (--MED) and alpha (20.5). The analysis does not detect other point mutations within this region, nor does it detect mutations of the beta-globin gene, that underlie beta-thalassemia or sickle cell disease. Results of this test are for investigational and research purposes only per the assay's manufacturer. The performance characteristics of this assay have not been established by the manufacturer. The result should not be used for treatment or for diagnostic purposes without confirmation of the diagnosis by another medically established diagnostic product or procedure. The performance characteristics were determined by [facility].  The presence of a rare mutation cannot be entirely ruled out. Molecular-based testing is highly accurate, but as in any laboratory test, rare diagnostic error may occur. References: 1. Chong SS et al. Arvella Latina multiplex-PCR screen for   commo n deletional determinants of alpha-thalassemia.   2000.  Blood 95:360-362. 2. Bosie Bye et al. Simplified Multiplex-PCR diagnosis of   common Southeast Asian deletional determinants of alpha-   thalassemia. 2000. Clin Chem 46(10):1692-5. 3.  Schrier SL et al. The unusal pathobiology of Hemoglobin   Constant Spring red blood cells.   1977. Blood 89:1762-1769. 4. Singer ST et al. Hemoglobin H-Constant Spring in [place]: an alpha thalassemia with frequent complications   . 2009. Am J Hematology [phone]. 5. Uaprasert N et al. Hematological characteristics and   effective screening for compound heterozygosity for Hb   Constant Spring and deletional alpha-+-thalassemia.   2011. Am J Hematology [phone]. 6. Galanello R and Cao A. Alpha-Thalassemia. 2011.   GeneReviews. www.genetests.org 7. [place]  [place]   [facility]   [email or web address] as cited on   09/10/2003. 8. Ansel Bass, Han H, et al. Detection of alpha-th alassemia in   [place] by using multiplex ligation-dependent probe   amplification. 2008. Hemoglobin 32:561-571. 9. [facility] SALSA MLPA P140-B2 HBA kit RUO package insert   version 16. Results Released By: Melodie Spry, PhD, FACMG [facility], [address] [place], [place] [address] Report Released By: Nida Barrow Dexter, MS, CGC, Genetic Counselor **Effective November 26, 2023, 440102 Alpha Thalassemia will be**  made non-orderable. [facility] will offer test code [phone]  Alpha-Thalassemia Analysis. For fetal samples (chorionic villus  sample (CVS), amniotic fluid, cultured fetal cells, or cord  blood), please contact a laboratory genetic counselor at  [phone] prior to specimen collection to discuss testing  options. For further information, please contact your local  [facility] Representative Results for this test are for research purposes only by the assay's manufacturer.  The performance characteristics of this product have not been established.  Results should not be used as  a diagnostic procedure without confirmation of the diagnosis by another medically established diagnostic product or procedure. Performed At: [facility] [address] [place], [place]  [id number] Adams Adams MDPhD [id number]   Hgb Fractionation Cascade     Status: None   Collection Time: 11/23/23 11:46 AM  Result Value Ref Range   Hgb F 0.0 0.0 - 2.0 %   Hgb A 97.4 96.4 - 98.8 %   Hgb A2 2.6 1.8 - 3.2 %   Hgb S 0.0 0.0 %   Interpretation, Hgb Fract Comment     Comment: (NOTE) Normal hemoglobin present; no hemoglobin variant or beta thalassemia identified. Note: Alpha thalassemia may not be detected by the Hgb Fractionation Cascade panel. If alpha thalassemia is suspected, [facility] offers Alpha-Thalassemia DNA Analysis [phone]). Performed At: [facility] [address] [place], [place] [id number] Pearlean Botts MD [id number]   Lactate dehydrogenase     Status: None   Collection Time: 11/23/23 11:46 AM  Result Value Ref Range   LDH 116 98 - 192 U/L    Comment: Performed at [facility], [address] [place], [place] [address]  Haptoglobin     Status: None   Collection Time: 11/23/23 11:46 AM  Result Value Ref Range   Haptoglobin 292 23 - 355 mg/dL    Comment: (NOTE) Performed At: [facility] [address] [place], [place] [id number] Pearlean Botts MD [id number]  Vitamin B12     Status: None   Collection Time: 11/23/23 11:46 AM  Result Value Ref Range   Vitamin B-12 226 180 - 914 pg/mL    Comment: (NOTE) This assay is not validated for testing neonatal or myeloproliferative syndrome specimens for Vitamin B12 levels. Performed at [facility], [address] [place], [place] [address]   Iron and Iron Binding Capacity ([facility] only)     Status: Abnormal   Collection Time: 11/23/23 11:46 AM  Result Value Ref Range   Iron 60 45 - 182 ug/dL   TIBC 478 295 - 621 ug/dL   Saturation Ratios 16 (L) 17.9 - 39.5 %   UIBC 312 117 - 376 ug/dL    Comment: Performed at [facility], [address] [place], [place] [address]  CMP (Cancer Center only)     Status:  Abnormal   Collection Time: 11/23/23 11:46 AM  Result Value Ref Range   Sodium 139 135 - 145 mmol/L   Potassium 4.4 3.5 - 5.1 mmol/L   Chloride 104 98 - 111 mmol/L   CO2 30 22 - 32 mmol/L   Glucose, Bld 93 70 - 99 mg/dL    Comment: Glucose reference range applies only to samples taken after fasting for at least 8 hours.   BUN 20 6 - 20 mg/dL   Creatinine 7.84 6.96 - 1.24 mg/dL   Calcium  9.1 8.9 - 10.3 mg/dL   Total Protein 7.6 6.5 - 8.1 g/dL   Albumin 4.4 3.5 - 5.0 g/dL   AST 11 (L) 15 - 41 U/L   ALT 12 0 - 44 U/L   Alkaline Phosphatase 99 38 - 126 U/L   Total Bilirubin 0.6 0.0 - 1.2 mg/dL   GFR, Estimated >29 >52 mL/min    Comment: (NOTE) Calculated using the CKD-EPI Creatinine Equation (2021)    Anion gap 5 5 - 15    Comment: Performed at [facility], [address] [place], [place] [address]  CBC with Differential (Cancer Center Only)     Status: Abnormal   Collection Time: 11/23/23 11:46 AM  Result Value Ref Range   WBC Count 8.7 4.0 - 10.5 K/uL   RBC 4.66 4.22 - 5.81 MIL/uL   Hemoglobin 10.7 (L) 13.0 - 17.0 g/dL    Comment: Reticulocyte Hemoglobin testing may be clinically indicated, consider ordering this additional test [id number]    HCT 34.3 (L) 39.0 - 52.0 %   MCV 73.6 (L) 80.0 - 100.0 fL   MCH 23.0 (L) 26.0 - 34.0 pg   MCHC 31.2 30.0 - 36.0 g/dL   RDW 53.6 64.4 - 03.4 %   Platelet Count 302 150 - 400 K/uL   nRBC 0.0 0.0 - 0.2 %   Neutrophils Relative % 66 %   Neutro Abs 5.7 1.7 - 7.7 K/uL   Lymphocytes Relative 20 %   Lymphs Abs 1.7 0.7 - 4.0 K/uL   Monocytes Relative 8 %   Monocytes Absolute 0.7 0.1 - 1.0 K/uL   Eosinophils Relative 5 %   Eosinophils Absolute 0.5 0.0 - 0.5 K/uL   Basophils Relative 1 %   Basophils Absolute 0.1 0.0 - 0.1 K/uL   Immature Granulocytes 0 %   Abs Immature Granulocytes 0.03 0.00 - 0.07 K/uL    Comment: Performed at [facility], [address], [place], [place] [address]   Direct antiglobulin test (not at [facility])     Status:  None   Collection Time: 11/23/23 11:47 AM  Result Value Ref Range   DAT, complement NEG    DAT, IgG      NEG Performed at [facility], [address] [place], [place] [address]   Folate     Status: None   Collection Time: 11/23/23 11:47 AM  Result Value Ref Range   Folate 8.3 >5.9 ng/mL    Comment: Performed at [facility], [address] [place], [place] [address]  Ferritin     Status: None   Collection Time: 11/23/23 11:47 AM  Result Value Ref Range   Ferritin 186 24 - 336 ng/mL    Comment: Performed at [facility], [address] [place], [place] [address]     RADIOGRAPHIC STUDIES:  No recent pertinent imaging studies available to review.  No orders of the defined types were placed in this encounter.    Future Appointments  Date Time Provider Department Center  12/20/2023  3:45 PM Bensimhon, Rheta Celestine, MD [facility] None  02/08/2024  9:30 AM Wendel Hals, NP [facility] None  02/22/2024 10:30 AM [facility] None  02/22/2024 11:00 AM Pleasant Britz, Gale Jude, MD [facility] None  03/27/2024 10:30 AM Schnier, Ninette Basque, MD [facility] None    This document was completed utilizing speech recognition software. Grammatical errors, random word insertions, pronoun errors, and incomplete sentences are an occasional consequence of this system due to software limitations, ambient noise, and hardware issues. Any formal questions or concerns about the content, text or information contained within the body of this dictation should be directly addressed to the provider for clarification.

## 2023-12-17 ENCOUNTER — Telehealth (HOSPITAL_BASED_OUTPATIENT_CLINIC_OR_DEPARTMENT_OTHER): Payer: Self-pay | Admitting: Adult Health

## 2023-12-17 NOTE — Telephone Encounter (Signed)
 Fax received from Dr. Lynita Saris with [facility] at [facility] to perform a right shoulder arthroplasty under general anesthesia on patient.  Patient needs surgery clearance. Surgery is pending. Patient was seen on 10/12/23. Office protocol is a risk assessment can be sent to surgeon if patient has been seen in 60 days or less.   Sending to [facility] for risk assessment or recommendations if patient needs to be seen in office prior to surgical procedure.

## 2023-12-19 ENCOUNTER — Telehealth: Payer: Self-pay | Admitting: Internal Medicine

## 2023-12-19 NOTE — Telephone Encounter (Signed)
 He is followed for Sleep apnea at our office -reported good compliance . From that aspect would be a Mild to moderate risk from pulmonary standpoint. Rec for CPAP in post op setting as needed.  Former smoker-PFT normal in 2023. No previous dx of copd   He is on Oxygen for CHF per cardiology. See cards notes for clearance.

## 2023-12-19 NOTE — Telephone Encounter (Signed)
 Called to confirm/remind patient of their appointment [facility] on 12/20/[facility].   Appointment:   [x] Confirmed  [] Left mess   [] No answer/No voice mail  [] VM Full/unable to leave message  [] Phone not in service  Patient reminded to bring all medications and/or complete list.  Confirmed patient has transportation. Gave directions, instructed to utilize valet parking.

## 2023-12-20 ENCOUNTER — Encounter: Payer: Self-pay | Admitting: Internal Medicine

## 2023-12-20 ENCOUNTER — Ambulatory Visit: Payer: PRIVATE HEALTH INSURANCE | Attending: Internal Medicine | Admitting: Internal Medicine

## 2023-12-20 VITALS — BP 98/78 | HR 95 | Wt 310.0 lb

## 2023-12-20 DIAGNOSIS — I5022 Chronic systolic (congestive) heart failure: Secondary | ICD-10-CM | POA: Insufficient documentation

## 2023-12-20 DIAGNOSIS — J9611 Chronic respiratory failure with hypoxia: Secondary | ICD-10-CM

## 2023-12-20 DIAGNOSIS — I110 Hypertensive heart disease with heart failure: Secondary | ICD-10-CM | POA: Diagnosis not present

## 2023-12-20 DIAGNOSIS — I890 Lymphedema, not elsewhere classified: Secondary | ICD-10-CM | POA: Insufficient documentation

## 2023-12-20 DIAGNOSIS — I2510 Atherosclerotic heart disease of native coronary artery without angina pectoris: Secondary | ICD-10-CM | POA: Insufficient documentation

## 2023-12-20 DIAGNOSIS — Z7984 Long term (current) use of oral hypoglycemic drugs: Secondary | ICD-10-CM | POA: Insufficient documentation

## 2023-12-20 DIAGNOSIS — E119 Type 2 diabetes mellitus without complications: Secondary | ICD-10-CM | POA: Insufficient documentation

## 2023-12-20 DIAGNOSIS — G4733 Obstructive sleep apnea (adult) (pediatric): Secondary | ICD-10-CM | POA: Insufficient documentation

## 2023-12-20 DIAGNOSIS — Z7985 Long-term (current) use of injectable non-insulin antidiabetic drugs: Secondary | ICD-10-CM | POA: Diagnosis not present

## 2023-12-20 DIAGNOSIS — Z79899 Other long term (current) drug therapy: Secondary | ICD-10-CM | POA: Insufficient documentation

## 2023-12-20 DIAGNOSIS — Z87891 Personal history of nicotine dependence: Secondary | ICD-10-CM | POA: Diagnosis not present

## 2023-12-20 DIAGNOSIS — I5032 Chronic diastolic (congestive) heart failure: Secondary | ICD-10-CM

## 2023-12-20 DIAGNOSIS — I10 Essential (primary) hypertension: Secondary | ICD-10-CM

## 2023-12-20 DIAGNOSIS — Z6839 Body mass index (BMI) 39.0-39.9, adult: Secondary | ICD-10-CM | POA: Diagnosis not present

## 2023-12-20 NOTE — Telephone Encounter (Signed)
 I have faxed this encounter to Dr. Rozelle Corning  Received fax confirmation

## 2023-12-20 NOTE — Progress Notes (Signed)
 [facility] CLINIC NOTE  Referring Physician: Dr. Junnie Olives Primary Care: Twylla Galen, MD Primary Cardiologist: Dr. Junnie Olives   HPI:  [name] Gregory is a 53 y.o. male with a history of HTN, T2DM, tobacco abuse, obesity, OSA (on CPAP) and HFrEF referred by Dr. Junnie Olives for further evaluation of his GF.   Has multiple family members with HF on his father's side. Developed HF symptoms in Jan/Feb 2023. He had a prior TTE in 2020 that showed moderate concentric hypertrophy with normal EF. He was seen by Dr. Kari Otto at [facility] 11/03/21 nd underwent echo and stress test. 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 LVEDP was elevated at . Echo same day showed EF 20-25%, LV dilation, hypokinesis of the apical anterior, apical, apical inferior and apical septal segments, and grade III diastolic dysfunction. He also complained that his heart was fluttering, lasts for several seconds. Given this a Ziopatch was placed which was unrevealing. (A several brief runs SVT. 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 Referred to genetics for further discussion    He has been following at [facility] and also with Dr. Junnie Olives and HF meds have been titrated. Recently had to cut back Entresto  due to low BP.     Echo 8/23 @ [facility] EF 55%  cMRI: 11/23: EF 52% RV normal. No LGE.  I saw him in October 2024 for initial HF consultation. EF had recovered but still having significant HF symptoms. ReDS 38%. cMRI obtained and was normal (see above).   RA = 3 RV = 35/7 PA = 36/19 (24) PCW = 11 Fick cardiac output/index = 9.0/3.3 PVR = 1.4 WU FA sat = 96% PA sat = 69%, 71% SVC sat = 78%  PFTS 6/23 FEV1 3.13 (80%) FVC 3.73 (77%) DLC O67%  Echo  3/25 EF 60% G1DD RV normal  CPX 5/24 BP rest: 100/78 Standing BP: 98/76 BP peak: 122/80  Peak VO2: 12.3 (50% predicted peak VO2)  - corrected to ibw 20.2 VE/VCO2 slope:  36  Peak RER: 0.74  VE/MVV:  36%  Markedly submax test - no real information attainable   Returns today for f/u. Still struggling with SOB and dizziness. Remains on O2 - see Dr. Villa Greaser. Is on 3L O2 and 4L with activity. Wears CPAP regularly. No, orthopnea or PND. Does get LE edema but improved with recent torsemide  increase by Dr. Junnie Olives  Past Medical History:  Diagnosis Date   Carpal tunnel syndrome    CHF (congestive heart failure) (HCC)    Complication of anesthesia    WOKE UP DURING SURGERY   Diabetes (HCC)    Fatty liver    GERD (gastroesophageal reflux disease)    Hemangioma of liver    Hypertension    Rotator cuff disorder    Sleep apnea    URI (upper respiratory infection)     Current Outpatient Medications  Medication Sig Dispense Refill   albuterol  (VENTOLIN  HFA) 108 (90 Base) MCG/ACT inhaler Inhale 2 puffs into the lungs every 6 (six) hours as needed for wheezing or shortness of breath. 8 g 2   ARIPiprazole (ABILIFY) 2 MG tablet Take 2 mg by mouth daily.     atorvastatin  (LIPITOR) 20 MG tablet TAKE ONE TABLET BY MOUTH EVERY DAY 90 tablet 3   buPROPion  (WELLBUTRIN  XL)  150 MG 24 hr tablet Take 150 mg by mouth 2 (two) times daily.     carvedilol  (COREG ) 25 MG tablet Take 1 tablet (25 mg total) by mouth 2 (two) times daily with a meal. 180 tablet 3   celecoxib  (CELEBREX ) 100 MG capsule Take 1 capsule (100 mg total) by mouth 2 (two) times daily. 60 capsule 0   citalopram  (CELEXA ) 20 MG tablet Take 1 tablet by mouth daily.     CREON 36000-114000 units CPEP capsule Take 36,000 Units by mouth 3 (three) times daily before meals.     dicyclomine (BENTYL) 10 MG capsule Take 10 mg by mouth in the morning and at bedtime.     erythromycin (E-MYCIN) 250 MG tablet Take 250 mg by mouth 3 (three) times daily.      famotidine (PEPCID) 20 MG tablet Take 20 mg by mouth 2 (two) times daily.     ferrous sulfate  325 (65 FE) MG EC tablet Take 1 tablet (325 mg total) by mouth daily with breakfast. 30 tablet 3   glipiZIDE  (GLUCOTROL  XL) 5 MG 24 hr tablet Take 5 mg by mouth every morning.     JARDIANCE  10 MG TABS tablet Take 1 tablet (10 mg total) by mouth daily. 90 tablet 3   LORazepam  (ATIVAN ) 0.5 MG tablet Take 0.5 mg by mouth every 8 (eight) hours as needed for anxiety.      magnesium oxide (MAG-OX) 400 (240 Mg) MG tablet Take 400 mg by mouth daily.     metFORMIN  (GLUCOPHAGE ) 1000 MG tablet Take 1 tablet (1,000 mg total) by mouth 2 (two) times daily with a meal. 180 tablet 3   metoCLOPramide  (REGLAN ) 10 MG tablet Take 10 mg by mouth 3 (three) times daily.     omeprazole (PRILOSEC) 20 MG capsule Take 20 mg by mouth 2 (two) times daily before a meal.     ondansetron  (ZOFRAN -ODT) 4 MG disintegrating tablet Take 4 mg by mouth 2 (two) times daily.     PEG 3350-KCl-NaBcb-NaCl-NaSulf (PEG-3350/ELECTROLYTES) 236 g SOLR Take by mouth as directed.     pregabalin (LYRICA) 50 MG capsule Take 50 mg by mouth 3 (three) times daily.     sacubitril-valsartan  (ENTRESTO ) 49-51 MG Take 1 tablet by mouth 2 (two) times daily. 60 tablet 5   spironolactone  (ALDACTONE ) 25 MG tablet Take 0.5 tablets (12.5 mg total) by mouth daily.     sucralfate (CARAFATE) 1 g tablet Take 1 g by mouth 2 (two) times daily.     tirzepatide (MOUNJARO) 7.5 MG/0.5ML Pen Inject 7.5 mg into the skin once a week. 6 mL 1   Torsemide  40 MG TABS Take 60 mg by mouth 2 (two) times daily. 270 tablet 1   Vitamin D , Ergocalciferol , (DRISDOL) 50000 units CAPS capsule Take 1 capsule by mouth 3 (three) times a week.     No current facility-administered medications for this visit.    No Known Allergies    Social History   Socioeconomic History   Marital status: Married    Spouse name: Not on file   Number of children: Not on file   Years of education: Not on file    Highest education level: Not on file  Occupational History   Occupation: administrative for [employer]  Tobacco Use   Smoking status: Former    Current packs/day: 0.00    Average packs/day: 0.3 packs/day for 36.0 years (9.0 ttl pk-yrs)    Types: Cigarettes    Start date: 11/18/1985  Quit date: 11/18/2021    Years since quitting: 2.0   Smokeless tobacco: Never   Tobacco comments:    QUIT SMOKING IN APRIL  Vaping Use   Vaping status: Former   Quit date: 09/28/2023  Substance and Sexual Activity   Alcohol use: Not Currently    Comment: maybe once a month   Drug use: No   Sexual activity: Not on file  Other Topics Concern   Not on file  Social History Narrative   Not on file   Social Drivers of Health   Financial Resource Strain: Unknown (07/17/2019)   Overall Financial Resource Strain (CARDIA)    Difficulty of Paying Living Expenses: Patient declined  Food Insecurity: No Food Insecurity (11/23/2023)   Hunger Vital Sign    Worried About Running Out of Food in the Last Year: Never true    Ran Out of Food in the Last Year: Never true  Transportation Needs: No Transportation Needs (11/23/2023)   PRAPARE - Administrator, Civil Service (Medical): No    Lack of Transportation (Non-Medical): No  Physical Activity: Unknown (07/17/2019)   Exercise Vital Sign    Days of Exercise per Week: Patient declined    Minutes of Exercise per Session: Patient declined  Stress: Not on file  Social Connections: Unknown (07/17/2019)   Social Connection and Isolation Panel [NHANES]    Frequency of Communication with Friends and Family: Patient declined    Frequency of Social Gatherings with Friends and Family: Patient declined    Attends Religious Services: Patient declined    Active Member of Clubs or Organizations: Patient declined    Attends Banker Meetings: Patient declined    Marital Status: Patient declined  Intimate Partner Violence: Not At Risk (11/23/2023)    Humiliation, Afraid, Rape, and Kick questionnaire    Fear of Current or Ex-Partner: No    Emotionally Abused: No    Physically Abused: No    Sexually Abused: No      Family History  Problem Relation Age of Onset   Hypertension Mother    CVA Father    Diabetes Father    Throat cancer Father    Heart disease Brother 29   Cervical cancer Maternal Grandmother    Throat cancer Maternal Grandmother    Throat cancer Maternal Grandfather    Bladder Cancer Maternal Grandfather    Cirrhosis Maternal Grandfather    Colon cancer Neg Hx     Vitals:   12/20/23 1548  BP: 98/78  Pulse: 95  SpO2: 99%  Weight: (!) 310 lb (140.6 kg)   Wt Readings from Last 3 Encounters:  12/20/23 (!) 310 lb (140.6 kg)  11/26/23 (!) 313 lb 6.4 oz (142.2 kg)  11/23/23 (!) 313 lb 1.6 oz (142 kg)     PHYSICAL EXAM: General:  Obese male wearing O2 . No resp difficulty at rest HEENT: normal Neck: supple. no JVD. Carotids 2+ bilat; no bruits. No lymphadenopathy or thryomegaly appreciated. Cor: PMI nondisplaced. Regular rate & rhythm. No rubs, gallops or murmurs. Lungs: clear Abdomen: soft, nontender, nondistended. No hepatosplenomegaly. No bruits or masses. Good bowel sounds. Extremities: no cyanosis, clubbing, rash, tr edema + compression socks Neuro: alert & orientedx3, cranial nerves grossly intact. moves all 4 extremities w/o difficulty. Affect pleasant   ASSESSMENT & PLAN:   1. 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 Referred to genetics for further discussion - SPEP/IFE showed no monoclonal protein - Echo 4/24 EF 20-25% - Cath 4/23 non-obstructive CAD - Echo 8/23  [facility]) with recovered LVEF 55%, LVIDd ~5.5, normal RV, RVSP ~29 - cMRI: 11/23: EF 52% RV normal. No LGE. - RHC 11/23  RA 3 PA 36/19 (24) PCW 11 Fick CO/CI 9.0/3.3 PA sat = 69%, 71% SVC sat = 78% - Echo 3/25 EF 60% G1DD RV normal - CPX test with markedly submax effort (RER 0.74) - Stable NYHA III  - Volume ok on torsemide  60 bid - Continue carvedilol  25 bid  - Continue Jardiance  10  - Continue Entresto  97/103 bid b - Continue Spiro 12.5 daily - Has seen [facility] - continue lymphedema pumps - Unable to affors cardiac or pulmonary rehab - Cardiac symptoms and O2 requirements remain out of proportion to objective findings. We have little else to offer at this point. He will f/u with Dr. Junnie Olives  2. Essential hypertension -Blood pressure well controlled. Continue current regimen.  3. DM2 - Followed by PCP - continue SGLT2i and GLP1RA  4. Palpitations - Zio Apr 2023, no significant arrhythmias, rare ventricular ectopy  5. Morbid obesity - Body mass index is 39.8 kg/m. - continue ozempic   6. Chronic Hypoxic Respiratory Failure: - PFTs ok.  - Unclear why he's requiring so much O2. Has been evaluted by Pulmonology (Dr. Villa Greaser) - No decline in sat with previously  7. OSA - continue CPAP   Jules Oar, MD  4:00 PM

## 2023-12-25 NOTE — Progress Notes (Signed)
 Called patient for SDW.   Patient stated that he will have to reschedule his procedure that is currently scheduled for 5/15.  He stated that he has left 2 messages at the office.  I also reached out to Jannifer Mention at Dr. Eliberto Grosser office as well as [place].

## 2024-01-01 ENCOUNTER — Encounter (INDEPENDENT_AMBULATORY_CARE_PROVIDER_SITE_OTHER): Payer: Self-pay

## 2024-01-02 ENCOUNTER — Other Ambulatory Visit: Payer: Self-pay | Admitting: Nurse Practitioner

## 2024-01-09 ENCOUNTER — Telehealth: Payer: Self-pay | Admitting: Orthopedic Surgery

## 2024-01-09 ENCOUNTER — Other Ambulatory Visit: Payer: Self-pay | Admitting: Nurse Practitioner

## 2024-01-09 NOTE — Telephone Encounter (Signed)
 Patient's wife Jolan Natal calling about the right shoulder manipulation under anesthesia, arthroscopy, debridement, rotator interval release scheduled at [facility] with Dr. Rozelle Corning on 01-24-24.  She has the following questions:  What are the limitations, what is the recovery time, and how long will patient be in Physical Therapy.  This family has a special needs child with Autism and because the surgery will require Khali to be the care giver to [place] during recovery along with the child -WHO will fill out FMLA paperwork for her? Can Dr. Rozelle Corning please do this?   They would like to continue with much needed vacation plans already scheduled 02-16-24 through 02-23-24.  Please call Jolan Natal directly if there are any issues with this request  [phone].

## 2024-01-10 ENCOUNTER — Telehealth: Payer: Self-pay | Admitting: *Deleted

## 2024-01-10 NOTE — Telephone Encounter (Signed)
 Thank you.  Will address at upcoming visit.

## 2024-01-10 NOTE — Telephone Encounter (Signed)
 [employer] called, they were asking that we send a prescription to them for the patient's Glipizide .They are preparing to package the patient's medications. In the office visit note in February , it is noted that this medication was being discontinued as the patient had been having hypoglycemic events in the afternoons.  I called [employer] back and shared this with them. Jullie Oiler states that they last filled it on April 28 th ,2025.. she said apparently the patient has continued to take it. Patient has a follow up appointment on June 27 ,2025. Shard with the patient that this would be addressed at the time , any updated information we will share with them.

## 2024-01-10 NOTE — Telephone Encounter (Signed)
 No limitations after surgery.  We would like for [name] Gregory to start moving the shoulder is much as possible.  Sling will be discontinued on postop day #1.  The recovery time really depends on how aggressive [name] Gregory is about moving his shoulder.  He will be in physical therapy essentially as long as it takes but in his case I would expect at least a couple of months and doing range of motion exercises minimum 1 to 2 hours a day particularly for the first 2 to 3 weeks.  We can do the paperwork.

## 2024-01-11 NOTE — Telephone Encounter (Signed)
 Wife understands to drop off paperwork and pay $20 fee

## 2024-01-11 NOTE — Telephone Encounter (Signed)
 Called and advised wife of what Dr .Rozelle Corning said. She stated understanding

## 2024-01-15 ENCOUNTER — Telehealth: Payer: Self-pay

## 2024-01-15 ENCOUNTER — Other Ambulatory Visit: Payer: Self-pay

## 2024-01-15 DIAGNOSIS — G8929 Other chronic pain: Secondary | ICD-10-CM

## 2024-01-15 NOTE — Telephone Encounter (Signed)
 Patient is scheduled for right shoulder manipulation under anesthesia, arthroscopy, debridement, rotator interval release on 01-24-24.    Note form [name] Gregory is attached to surgery order that reads the following:  Deb-please let me know when surgery is scheduled.  Patient needs PT 1st day post op.   I have tried to contact patient with numbers in chart. I was unsuccessful.  Not sure where patient wants to go to therapy at since he lives in [place]. [facility] is located there [phone] Fax#[phone] I will place referral there just in case.

## 2024-01-15 NOTE — Telephone Encounter (Signed)
 Placed order for [facility] just in case.

## 2024-01-16 NOTE — Telephone Encounter (Signed)
 Tried again. Also called his wife's number and it goes straight to voicemail. I left a very detailed voicemail with ALL information. I also included that I have already put order in for [facility] and gave him the phone number. If he prefers somewhere else for PT, I ask that he call us  back ASAP.

## 2024-01-17 ENCOUNTER — Telehealth: Payer: Self-pay | Admitting: Orthopedic Surgery

## 2024-01-17 NOTE — Telephone Encounter (Signed)
 Called, and again no answer. LMOM for patient to call back ASAP with location for therapy and we will sent it. She does not have to speak with us , just call and give location for therapy services that they prefer.

## 2024-01-17 NOTE — Telephone Encounter (Signed)
 Patient's wife calling about physical therapy instructions.  [facility] PT is not open on Fridays and Dr. Rozelle Corning has instructed the therapy to begin on 13th.  Does patient need to go elsewhere?  Please call patient' wife at [phone] as she is needing clarity on the instructions.

## 2024-01-17 NOTE — Telephone Encounter (Signed)
 Pt's wife called to change location of [facility]. Pt states to please call her on  number [phone]. Read pt note. She would rather speak to Lauren G that left the message

## 2024-01-18 ENCOUNTER — Telehealth: Payer: Self-pay | Admitting: Orthopedic Surgery

## 2024-01-18 ENCOUNTER — Other Ambulatory Visit: Payer: Self-pay | Admitting: Radiology

## 2024-01-18 ENCOUNTER — Encounter: Payer: Self-pay | Admitting: Nurse Practitioner

## 2024-01-18 DIAGNOSIS — M25511 Pain in right shoulder: Secondary | ICD-10-CM

## 2024-01-18 MED ORDER — TIRZEPATIDE 10 MG/0.5ML ~~LOC~~ SOAJ: 10.0000 mg | SUBCUTANEOUS | 1 refills | Status: DC

## 2024-01-18 NOTE — Telephone Encounter (Signed)
 I would have her go somewhere else just to make sure that he starts on that day because his shoulders generally stiff and that he does not get moving it will likely remain stiff

## 2024-01-18 NOTE — Telephone Encounter (Signed)
 Received vm from pts spouse/Alisha requesting that her FMLA forms be faxed versus pickuped. Faxed to [phone]

## 2024-01-18 NOTE — Telephone Encounter (Signed)
 Thx ok to schedule asap beginning after surgery

## 2024-01-18 NOTE — Telephone Encounter (Signed)
 Spoke with patients wife, sending to [facility] Spoke with representative from physical therapy to try and get patient scheduled as soon as possible.

## 2024-01-21 ENCOUNTER — Telehealth: Payer: Self-pay | Admitting: Nurse Practitioner

## 2024-01-21 NOTE — Telephone Encounter (Signed)
 Received medical records request - sent to [facility]

## 2024-01-22 ENCOUNTER — Other Ambulatory Visit: Payer: Self-pay

## 2024-01-22 ENCOUNTER — Encounter (HOSPITAL_COMMUNITY): Payer: Self-pay | Admitting: Orthopedic Surgery

## 2024-01-22 NOTE — Progress Notes (Signed)
 SDW call  Patient was given pre-op instructions over the phone. Patient verbalized understanding of instructions provided.     PCP - Dr. Twylla Galen Cardiologist - Dr. Loma Rising, cardiac clearance 11/26/2023 Pulmonary:    PPM/ICD - deneis Device Orders - na Rep Notified - na   Chest x-ray - na EKG -  09/28/2023 Stress Test -12/27/2022 ECHO - 10/23/2023 Cardiac Cath - 11/15/20223  Sleep Study/sleep apnea/CPAP: OSA with nightly CPAP  Type II diabetes. A1c 5.7 10/11/2023 Fasting Blood sugar range: 100-120 How often check sugars: daily Glipizide , hold DOS Metformin , hold DOS Jardiance , hold 72 hours, states last dose 01/21/2024  GLP1: Mounjaro , states last dose 01/13/2024   Blood Thinner Instructions: denies Aspirin  Instructions:denies   ERAS Protcol - Clears until 1200   Anesthesia review: Yes. HTN, CHR, DM, OSA with CPAP   Patient denies shortness of breath, fever, cough and chest pain over the phone call  Your procedure is scheduled on Thursday January 24, 2024  Report to [facility] Entrance "A" at  1230 PM., then check in with the Admitting office.  Call this number if you have problems the morning of surgery:  [phone]   If you have any questions prior to your surgery date call [phone]: Open Monday-Friday 8am-4pm If you experience any cold or flu symptoms such as cough, fever, chills, shortness of breath, etc. between now and your scheduled surgery, please notify us  at the above number     Remember:  Do not eat after midnight the night before your surgery  You may drink clear liquids until  Noon the day of your surgery.   Clear liquids allowed are: Water , Non-Citrus Juices (without pulp), Carbonated Beverages, Clear Tea, Black Coffee ONLY (NO MILK, CREAM OR POWDERED CREAMER of any kind), and Gatorade   Take these medicines the morning of surgery with A SIP OF WATER :  Amlodipine, abilify, atorvastatin , pepcid, reglan , zofran , wellbutrin , carvedilol ,  celexa , creon, bentyl, erythromycin, prilosec, lyrica  As needed: Albuterol , ativan   As of today, STOP taking any Aspirin  (unless otherwise instructed by your surgeon) Aleve, Naproxen, Ibuprofen, Motrin, Advil, Goody's, BC's, all herbal medications, fish oil, and all vitamins.  This includes your celebrex 

## 2024-01-23 ENCOUNTER — Encounter (HOSPITAL_COMMUNITY): Payer: Self-pay | Admitting: Orthopedic Surgery

## 2024-01-23 NOTE — Anesthesia Preprocedure Evaluation (Addendum)
 Anesthesia Evaluation  Patient identified by MRN, date of birth, ID band Patient awake    Reviewed: Allergy & Precautions, NPO status , Patient's Chart, lab work & pertinent test results  Airway Mallampati: II  TM Distance: >3 FB Neck ROM: Full    Dental no notable dental hx. (+) Dental Advisory Given   Pulmonary sleep apnea , former smoker   Pulmonary exam normal        Cardiovascular hypertension, Pt. on home beta blockers and Pt. on medications +CHF  Normal cardiovascular exam     Neuro/Psych   Anxiety     negative neurological ROS  negative psych ROS   GI/Hepatic Neg liver ROS,GERD  ,,  Endo/Other  diabetes, Type 2  Class 3 obesity  Renal/GU negative Renal ROS     Musculoskeletal   Abdominal   Peds  Hematology   Anesthesia Other Findings   Reproductive/Obstetrics                             Anesthesia Physical Anesthesia Plan  ASA: III  Anesthesia Plan: General   Post-op Pain Management:    Induction: Intravenous  PONV Risk Score and Plan: 2 and Ondansetron , Midazolam  and Treatment may vary due to age or medical condition  Airway Management Planned: Oral ETT  Additional Equipment:   Intra-op Plan:   Post-operative Plan: Extubation in OR  Informed Consent: I have reviewed the patients History and Physical, chart, labs and discussed the procedure including the risks, benefits and alternatives for the proposed anesthesia with the patient or authorized representative who has indicated his/her understanding and acceptance.     Dental advisory given  Plan Discussed with: CRNA, Anesthesiologist and Surgeon  Anesthesia Plan Comments: (PAT note written 01/23/2024 by Allison Zelenak, PA-C.  )        Anesthesia Quick Evaluation

## 2024-01-23 NOTE — Progress Notes (Signed)
 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  History includes former smoker (quit 11/18/21), HTN, DM2, OSA (uses CPAP), NICM, chronic systolic CHF, GERD, fatty liver, anxiety, liver hemangioma, alpha thalassemia minor. Reported waking up during a procedure.   Last HF cardiology visit was on 12/20/23 with Dr. Bensimhon. Patient developed HF symptoms in Jan/Feb 2023. 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 11/18/21 TTE showed LVEF 20-25%, LV dilation, hypokinesis of the apical anterior, apical, apical inferior and apical septal segments, grade III diastolic dysfunction. 11/2021 Zio moniotr showed several brief runs of SVT with 26 triggered events associated with infrequent PVCs. SPEP/IFE showed no monoclonal protein.  No infiltrative disease on 06/2022 cMRI. He has been managed on GDMT. 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 Referred to genetics for further discussion. LVEF has been ~ 50-60% since 03/2022. He continued to have symptoms of dyspnea and dizziness. He was on 3-4L O2 with activity per pulmonologist Dr. Villa Greaser. He was compliant with CPAP. Uses lymphedema pumps per [facility]. 12/2022  CPX with markedly submaximal effort. Last TTE in 10/2023 showed LVEF 55-606, no RMWA, grade 1 DD, normal RV systolic function. At May visit, Dr. Bensimhon added, Cardiac symptoms and O2 requirements remain out of proportion to objective findings. We have little else to offer at this point. He will f/u with Dr. Junnie Olives.  He had preoperative cardiology input at 11/26/23 visit with Dr. Junnie Olives, Preprocedural exam, right shoulder surgery being planned. Okay for surgical procedure from a cardiac perspective. Echo last month showed normal systolic function. Patient on optimal cardiac medications.   Pulmonologist input outlined by Roena Clark, NP on 12/19/23, He is followed for Sleep apnea at our office -reported good compliance . From that aspect would be a Mild to moderate risk from pulmonary standpoint. Rec for CPAP in post op setting as needed.  Former smoker-PFT normal in 2023. No previous dx of copd    He is on Oxygen for CHF per cardiology. See cards notes for clearance.  Last hematology follow-up with Dr. Randye Buttner on 12/07/23. He wrote, Hemoglobin electrophoresis showed normal hemoglobin pattern.  No evidence of beta thalassemia.   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    Alpha thalassemia minor explains chronic microcytic anemia.  No intervention needed at current hemoglobin levels. 3 month follow-up planned.    Last Jardiance  01/21/24. Last Mounjaro  01/13/24.   Anesthesia team to evaluate on the day of surgery.   VS: Ht 6' 2 (1.88 m)   Wt (!) 140.2 kg   BMI 39.67 kg/m  BP Readings from Last 3 Encounters:  12/20/23 98/78  11/26/23 102/68  11/23/23 90/73   Pulse Readings from Last 3 Encounters:  12/20/23 95  11/26/23 98  11/23/23 81     PROVIDERS: Twylla Galen, MD is PCP  Constancia Delton, MD is cardiologist Jules Oar, MD is HF cardiologist. Discharged back to primary cardiology at 12/20/23 visit.   Celene Coins, MD is pulmonologist  Arlo Berber, MD is HEM-ONC   LABS: For day of surgery as indicated. Most recent results in [facility] include: Lab Results  Component Value Date   WBC 8.7 11/23/2023   HGB 10.7 (L) 11/23/2023   HCT 34.3 (L) 11/23/2023   PLT 302 11/23/2023   GLUCOSE 93 11/23/2023   CHOL 121 06/26/2023   TRIG 148 06/26/2023   HDL 40 06/26/2023   LDLCALC 55 06/26/2023   ALT 12 11/23/2023   AST 11 (L) 11/23/2023   NA 139 11/23/2023   K 4.4 11/23/2023   CL 104 11/23/2023   CREATININE 1.14 11/23/2023   BUN 20 11/23/2023   CO2 30 11/23/2023   TSH 0.952 09/28/2023   HGBA1C 5.7 (A) 10/11/2023      IMAGES: CT Chest LCS 10/09/23: IMPRESSION: 1. Lung-RADS 1, negative. Continue annual screening with low-dose chest CT without contrast in 12 months. 2.  Aortic Atherosclerosis (ICD10-I70.0). 3. Mild artifact degradation as detailed above.   MRI Left Shoulder 07/18/23: IMPRESSION: 1. Mild supraspinatus tendinosis. 2. Moderate subscapularis tendinosis. 3. Moderate tendinosis of the intra-articular long head of the biceps tendon with a small partial-thickness tear of the bicipital-labral junction and a small interstitial tear. 4. Thickening of the inferior joint capsule as can be seen with adhesive capsulitis.  MRI Right Shoulder 05/12/23: IMPRESSION: 1. Mild tendinosis of the infraspinatus tendon. 2. Moderate tendinosis of the subscapularis tendon. 3. Moderate tendinosis of the intra-articular portion of the long head of the biceps tendon. 4. Thickening of the inferior joint capsule and intermediate signal material effacing the normal subcoracoid fat as can be seen with adhesive capsulitis. 5. 8 mm erosion involving the anteromedial humeral head with surrounding bone marrow edema. Differential considerations include an inflammatory arthropathy versus crystalline arthropathy such as gout. Correlate with laboratory values.   CXR 09/10/22: FINDINGS: The heart  size and mediastinal contours are within normal limits. Both lungs are clear. The visualized skeletal structures are unremarkable. IMPRESSION: No active cardiopulmonary disease.   EKG: 09/28/23: NSR   CV: Echo 10/23/23: IMPRESSIONS   1. Left ventricular ejection fraction, by estimation, is 55 to 60%. The  left ventricle has normal function. The left ventricle has no regional  wall motion abnormalities. There is mild left ventricular hypertrophy.  Left ventricular diastolic parameters  are consistent with Grade I diastolic dysfunction (impaired relaxation).  The average left ventricular global longitudinal strain is -15.6 %. The  global longitudinal strain is abnormal.   2. Right ventricular systolic function is normal. The right ventricular  size is normal. Tricuspid regurgitation signal is inadequate for assessing  PA pressure.   3. Left atrial size was mildly dilated.   4. The mitral valve is normal in structure. No evidence of mitral valve  regurgitation. No evidence of mitral stenosis.   5. The aortic valve is tricuspid. Aortic valve regurgitation is not  visualized. No aortic stenosis is present.   6. There is borderline dilatation of the aortic root, measuring 39 mm.   7. The inferior vena cava is normal in size with greater than 50%  respiratory variability, suggesting right atrial pressure of 3 mmHg.    CPX 12/27/22: Conclusion: The interpretation of this test is limited due to submaximal effort during the exercise. Based on available  data, exercise testing with gas exchange demonstrates moderate to severe functional capacity when compared to matched sedentary norms. Patient presents with multifactorial limitations. Patient's body habitus is playing a role in exercise intolerance.  (Preliminary impression by: Agustina Aldrich, MS, ACSM-RCEP) - Attending: Markedly submaximal exercise testing with peak RER (0.74) not rising above resting levels. Patient only exercised 2:45  on modified Naughton protocol. Very little can be gleaned from this test but based on existing data patient appears limited by his HF, deconditioning and obesity. Would recommend exercise training program to build stamina and then retest. Jules Oar, MD)    US  Carotid 09/21/22: Summary:  - Right Carotid: There was no evidence of thrombus, dissection,  atherosclerotic plaque or stenosis in the cervical carotid system.  - Left Carotid: There was no evidence of thrombus, dissection,  atherosclerotic plaque or stenosis in the cervical carotid system.  - Vertebrals:  Bilateral vertebral arteries demonstrate antegrade flow.  - Subclavians: Normal flow hemodynamics were seen in bilateral subclavian arteries.    RHC 06/28/22: Findings: RA = 3 RV = 35/7 PA = 36/19 (24) PCW = 11 Fick cardiac output/index = 9.0/3.3 PVR = 1.4 WU FA sat = 96% PA sat = 69%, 71% SVC sat = 78%   Assessment: 1. Mild PAH in setting of high output 2. Normal left-sided filling pressures   Plan/Discussion: Suggest weight loss.    8-15 Day Zio XT Monitor 12/06/21 [facility]): Patient had a min HR of 64 bpm, max HR of 187 bpm, and avg HR of 82 bpm.  Predominant underlying rhythm was Sinus Rhythm. 2 Ventricular Tachycardia runs  occurred, the run with the fastest interval lasting 5 beats with a max rate of 187  bpm, the longest lasting 5 beats with an avg rate of 125 bpm. 3 Supraventricular  Tachycardia runs occurred, the run with the fastest interval lasting 5 beats with a  max rate of 141 bpm (avg 125 bpm); the run with the fastest interval was also the  longest. Isolated SVEs were rare (<1.0%, 172), and no SVE Couplets or SVE Triplets  were present. Isolated VEs were rare (<1.0%, 3809), VE Couplets were rare (<1.0%,  67), and no VE Triplets were present. Ventricular Bigeminy and Trigeminy were  present. Difficulty discerning atrial activity at times making definitive diagnosis  difficult to ascertain.  -  There are 26 patient triggered events/diary entries.  Most episodes corresponded to sinus rhythm with occasional PVC.     Cardiac MRI 06/05/22:  IMPRESSION: 1. Normal LV size and function, LVEF = 52% 2. No evidence for late gadolinium enhancement, scar or infiltrative disease. 3. No significant valvular abnormalities. 4. Normal RV size and function. 5. Small pericardial effusion.    LHC 11/18/21 ([facility]): Findings:  Severe LV systolic dysfunction  Elevated LVEDP at 31 mm Hg  Marked myocardial bridging in the mid-LAD  No significant coronary artery disease (the RCA is non-dominant)   Recommendations:  Medical Management, placing referral to HF clinic here at [facility] for further  titration of GDMT for NICM.  Will increase diuresis given elevated LV EDP    Past Medical History:  Diagnosis Date   Anxiety    Carpal tunnel syndrome    CHF (congestive heart failure) (HCC)    Complication of anesthesia    WOKE UP DURING SURGERY   Diabetes (HCC)    Fatty liver    GERD (gastroesophageal reflux disease)    Hemangioma of liver    Hypertension    Rotator  cuff disorder    Sleep apnea    URI (upper respiratory infection)     Past Surgical History:  Procedure Laterality Date   BIOPSY  05/04/2016   Procedure: BIOPSY;  Surgeon: Suzette Espy, MD;  Location: [facility];  Service: Endoscopy;;  gastric bx   CARDIAC CATHETERIZATION Bilateral 10/2021   COLONOSCOPY WITH PROPOFOL  N/A 05/04/2016    PROPOFOL ;  Surgeon: Suzette Espy, MD; diverticulosis throughout the entire colon, otherwise normal.  Repeat due in 2027.   ESOPHAGOGASTRODUODENOSCOPY (EGD) WITH PROPOFOL  N/A 05/04/2016   PROPOFOL ;  Surgeon: Suzette Espy, MD; LA grade B esophagitis, gastritis, small hiatal hernia.   EYE SURGERY     RIGHT HEART CATH Right 06/28/2022   Procedure: RIGHT HEART CATH;  Surgeon: Mardell Shade, MD;  Location: [facility] INVASIVE CV LAB;  Service: Cardiovascular;  Laterality: Right;   SALIVARY GLAND  SURGERY  1992    MEDICATIONS: No current facility-administered medications for this encounter.    amLODipine (NORVASC) 10 MG tablet   Torsemide  40 MG TABS   albuterol  (VENTOLIN  HFA) 108 (90 Base) MCG/ACT inhaler   ARIPiprazole (ABILIFY) 2 MG tablet   atorvastatin  (LIPITOR) 20 MG tablet   buPROPion  (WELLBUTRIN  XL) 150 MG 24 hr tablet   carvedilol  (COREG ) 25 MG tablet   celecoxib  (CELEBREX ) 100 MG capsule   citalopram  (CELEXA ) 20 MG tablet   CREON 36000-114000 units CPEP capsule   dicyclomine (BENTYL) 10 MG capsule   erythromycin (E-MYCIN) 250 MG tablet   famotidine (PEPCID) 20 MG tablet   ferrous sulfate  325 (65 FE) MG EC tablet   glipiZIDE  (GLUCOTROL  XL) 5 MG 24 hr tablet   JARDIANCE  10 MG TABS tablet   LORazepam  (ATIVAN ) 0.5 MG tablet   magnesium oxide (MAG-OX) 400 (240 Mg) MG tablet   metFORMIN  (GLUCOPHAGE ) 1000 MG tablet   metoCLOPramide  (REGLAN ) 10 MG tablet   omeprazole (PRILOSEC) 40 MG capsule   ondansetron  (ZOFRAN -ODT) 4 MG disintegrating tablet   PEG 3350-KCl-NaBcb-NaCl-NaSulf (PEG-3350/ELECTROLYTES) 236 g SOLR   pregabalin (LYRICA) 50 MG capsule   sacubitril-valsartan  (ENTRESTO ) 49-51 MG   spironolactone  (ALDACTONE ) 25 MG tablet   sucralfate (CARAFATE) 1 g tablet   tirzepatide  (MOUNJARO ) 10 MG/0.5ML Pen   Vitamin D , Ergocalciferol , (DRISDOL) 50000 units CAPS capsule    Ella Gun, PA-C Surgical Short Stay/Anesthesiology [facility] Phone [phone] [facility] Phone [phone] 01/23/2024 1:37 PM

## 2024-01-24 ENCOUNTER — Ambulatory Visit (HOSPITAL_COMMUNITY): Payer: PRIVATE HEALTH INSURANCE | Admitting: Vascular Surgery

## 2024-01-24 ENCOUNTER — Encounter (HOSPITAL_COMMUNITY): Payer: Self-pay | Admitting: Orthopedic Surgery

## 2024-01-24 ENCOUNTER — Other Ambulatory Visit: Payer: Self-pay

## 2024-01-24 ENCOUNTER — Ambulatory Visit (HOSPITAL_COMMUNITY)
Admission: RE | Admit: 2024-01-24 | Discharge: 2024-01-24 | Disposition: A | Discharge status: Home / Self Care | Payer: PRIVATE HEALTH INSURANCE | Attending: Orthopedic Surgery | Admitting: Orthopedic Surgery

## 2024-01-24 ENCOUNTER — Encounter (HOSPITAL_COMMUNITY)
Admission: RE | Disposition: A | Discharge status: Home / Self Care | Payer: Self-pay | Source: Home / Self Care | Attending: Orthopedic Surgery

## 2024-01-24 DIAGNOSIS — Z7984 Long term (current) use of oral hypoglycemic drugs: Secondary | ICD-10-CM | POA: Insufficient documentation

## 2024-01-24 DIAGNOSIS — I428 Other cardiomyopathies: Secondary | ICD-10-CM | POA: Diagnosis not present

## 2024-01-24 DIAGNOSIS — F419 Anxiety disorder, unspecified: Secondary | ICD-10-CM | POA: Insufficient documentation

## 2024-01-24 DIAGNOSIS — I110 Hypertensive heart disease with heart failure: Secondary | ICD-10-CM

## 2024-01-24 DIAGNOSIS — K760 Fatty (change of) liver, not elsewhere classified: Secondary | ICD-10-CM | POA: Insufficient documentation

## 2024-01-24 DIAGNOSIS — Z9981 Dependence on supplemental oxygen: Secondary | ICD-10-CM | POA: Diagnosis not present

## 2024-01-24 DIAGNOSIS — D563 Thalassemia minor: Secondary | ICD-10-CM | POA: Insufficient documentation

## 2024-01-24 DIAGNOSIS — Z01818 Encounter for other preprocedural examination: Secondary | ICD-10-CM

## 2024-01-24 DIAGNOSIS — M7501 Adhesive capsulitis of right shoulder: Secondary | ICD-10-CM | POA: Diagnosis present

## 2024-01-24 DIAGNOSIS — Z79899 Other long term (current) drug therapy: Secondary | ICD-10-CM | POA: Insufficient documentation

## 2024-01-24 DIAGNOSIS — Z6839 Body mass index (BMI) 39.0-39.9, adult: Secondary | ICD-10-CM | POA: Diagnosis not present

## 2024-01-24 DIAGNOSIS — Z7985 Long-term (current) use of injectable non-insulin antidiabetic drugs: Secondary | ICD-10-CM | POA: Diagnosis not present

## 2024-01-24 DIAGNOSIS — I5022 Chronic systolic (congestive) heart failure: Secondary | ICD-10-CM | POA: Insufficient documentation

## 2024-01-24 DIAGNOSIS — I509 Heart failure, unspecified: Secondary | ICD-10-CM

## 2024-01-24 DIAGNOSIS — G4733 Obstructive sleep apnea (adult) (pediatric): Secondary | ICD-10-CM | POA: Diagnosis not present

## 2024-01-24 DIAGNOSIS — Z87891 Personal history of nicotine dependence: Secondary | ICD-10-CM | POA: Insufficient documentation

## 2024-01-24 DIAGNOSIS — E66813 Obesity, class 3: Secondary | ICD-10-CM | POA: Diagnosis not present

## 2024-01-24 DIAGNOSIS — E119 Type 2 diabetes mellitus without complications: Secondary | ICD-10-CM | POA: Diagnosis not present

## 2024-01-24 DIAGNOSIS — G4730 Sleep apnea, unspecified: Secondary | ICD-10-CM | POA: Diagnosis not present

## 2024-01-24 DIAGNOSIS — K219 Gastro-esophageal reflux disease without esophagitis: Secondary | ICD-10-CM | POA: Insufficient documentation

## 2024-01-24 HISTORY — DX: Thalassemia minor: D56.3

## 2024-01-24 HISTORY — PX: EXAM UNDER ANESTHESIA WITH MANIPULATION OF SHOULDER: SHX5817

## 2024-01-24 HISTORY — DX: Anxiety disorder, unspecified: F41.9

## 2024-01-24 HISTORY — PX: POSTERIOR LUMBAR FUSION 2 WITH HARDWARE REMOVAL: SHX7297

## 2024-01-24 LAB — GLUCOSE, CAPILLARY
Glucose-Capillary: 135 mg/dL — ABNORMAL HIGH (ref 70–99)
Glucose-Capillary: 80 mg/dL (ref 70–99)
Glucose-Capillary: 132 mg/dL — ABNORMAL HIGH (ref 70–99)

## 2024-01-24 LAB — CBC
HCT: 35 % — ABNORMAL LOW (ref 39.0–52.0)
Hemoglobin: 10.9 g/dL — ABNORMAL LOW (ref 13.0–17.0)
MCH: 22.9 pg — ABNORMAL LOW (ref 26.0–34.0)
MCHC: 31.1 g/dL (ref 30.0–36.0)
MCV: 73.7 fL — ABNORMAL LOW (ref 80.0–100.0)
Platelets: 304 10*3/uL (ref 150–400)
RBC: 4.75 MIL/uL (ref 4.22–5.81)
RDW: 13.9 % (ref 11.5–15.5)
WBC: 9.1 10*3/uL (ref 4.0–10.5)
nRBC: 0 % (ref 0.0–0.2)

## 2024-01-24 LAB — BASIC METABOLIC PANEL WITH GFR
Anion gap: 9 (ref 5–15)
BUN: 23 mg/dL — ABNORMAL HIGH (ref 6–20)
CO2: 25 mmol/L (ref 22–32)
Calcium: 9.2 mg/dL (ref 8.9–10.3)
Chloride: 101 mmol/L (ref 98–111)
Creatinine, Ser: 1.17 mg/dL (ref 0.61–1.24)
GFR, Estimated: >60 mL/min (ref >60)
Glucose, Bld: 120 mg/dL — ABNORMAL HIGH (ref 70–99)
Potassium: 4.5 mmol/L (ref 3.5–5.1)
Sodium: 135 mmol/L (ref 135–145)

## 2024-01-24 SURGERY — EXAM UNDER ANESTHESIA, SHOULDER, WITH MANIPULATION
Anesthesia: General | Site: Shoulder | Laterality: Right

## 2024-01-24 MED ORDER — KETOROLAC TROMETHAMINE 30 MG/ML IJ SOLN
INTRAMUSCULAR | Status: AC
  Filled 2024-01-24: qty 1

## 2024-01-24 MED ORDER — SODIUM CHLORIDE 0.9 % IV SOLN: 12.5000 mg | INTRAVENOUS | Status: DC | PRN

## 2024-01-24 MED ORDER — MORPHINE SULFATE (PF) 4 MG/ML IV SOLN
INTRAVENOUS | Status: AC
  Filled 2024-01-24: qty 1

## 2024-01-24 MED ORDER — ONDANSETRON HCL 4 MG/2ML IJ SOLN
INTRAMUSCULAR | Status: DC | PRN
  Administered 2024-01-24: 4 mg via INTRAVENOUS

## 2024-01-24 MED ORDER — DEXAMETHASONE SODIUM PHOSPHATE 10 MG/ML IJ SOLN
INTRAMUSCULAR | Status: AC
  Filled 2024-01-24: qty 1

## 2024-01-24 MED ORDER — FENTANYL CITRATE (PF) 250 MCG/5ML IJ SOLN
INTRAMUSCULAR | Status: DC | PRN
  Administered 2024-01-24: 100 ug via INTRAVENOUS
  Administered 2024-01-24: 50 ug via INTRAVENOUS

## 2024-01-24 MED ORDER — SUGAMMADEX SODIUM 200 MG/2ML IV SOLN
INTRAVENOUS | Status: DC | PRN
  Administered 2024-01-24 (×2): 100 mg via INTRAVENOUS

## 2024-01-24 MED ORDER — HYDROMORPHONE HCL 1 MG/ML IJ SOLN
INTRAMUSCULAR | Status: AC
  Filled 2024-01-24: qty 1

## 2024-01-24 MED ORDER — OXYCODONE HCL 5 MG/5ML PO SOLN: 5.0000 mg | Freq: Once | ORAL | Status: AC | PRN

## 2024-01-24 MED ORDER — KETOROLAC TROMETHAMINE 30 MG/ML IJ SOLN
INTRAMUSCULAR | Status: DC | PRN
  Administered 2024-01-24: 30 mg via INTRAVENOUS

## 2024-01-24 MED ORDER — MORPHINE SULFATE (PF) 4 MG/ML IV SOLN
INTRAVENOUS | Status: DC | PRN
  Administered 2024-01-24: 33 mL

## 2024-01-24 MED ORDER — METHOCARBAMOL 500 MG PO TABS: 500.0000 mg | ORAL_TABLET | Freq: Three times a day (TID) | ORAL | 1 refills | Status: DC | PRN

## 2024-01-24 MED ORDER — EPINEPHRINE PF 1 MG/ML IJ SOLN
INTRAMUSCULAR | Status: DC | PRN
  Administered 2024-01-24: 1 mL

## 2024-01-24 MED ORDER — INSULIN ASPART 100 UNIT/ML IJ SOLN: 0.0000 [IU] | INTRAMUSCULAR | Status: DC | PRN

## 2024-01-24 MED ORDER — EPINEPHRINE PF 1 MG/ML IJ SOLN
INTRAMUSCULAR | Status: AC
  Filled 2024-01-24: qty 1

## 2024-01-24 MED ORDER — LIDOCAINE 2% (20 MG/ML) 5 ML SYRINGE
INTRAMUSCULAR | Status: AC
  Filled 2024-01-24: qty 5

## 2024-01-24 MED ORDER — HYDROMORPHONE HCL 1 MG/ML IJ SOLN
0.2500 mg | INTRAMUSCULAR | Status: DC | PRN
  Administered 2024-01-24 (×2): 0.25 mg via INTRAVENOUS
  Administered 2024-01-24: 0.5 mg via INTRAVENOUS

## 2024-01-24 MED ORDER — FENTANYL CITRATE (PF) 250 MCG/5ML IJ SOLN
INTRAMUSCULAR | Status: AC
  Filled 2024-01-24: qty 5

## 2024-01-24 MED ORDER — TRANEXAMIC ACID-NACL 1000-0.7 MG/100ML-% IV SOLN
1000.0000 mg | INTRAVENOUS | Status: AC
  Administered 2024-01-24: 1000 mg via INTRAVENOUS

## 2024-01-24 MED ORDER — OXYCODONE HCL 5 MG PO TABS
5.0000 mg | ORAL_TABLET | Freq: Once | ORAL | Status: AC | PRN
  Administered 2024-01-24: 5 mg via ORAL

## 2024-01-24 MED ORDER — LIDOCAINE 2% (20 MG/ML) 5 ML SYRINGE
INTRAMUSCULAR | Status: DC | PRN
  Administered 2024-01-24: 100 mg via INTRAVENOUS

## 2024-01-24 MED ORDER — PROPOFOL 10 MG/ML IV BOLUS
INTRAVENOUS | Status: AC
  Filled 2024-01-24: qty 20

## 2024-01-24 MED ORDER — DEXAMETHASONE SODIUM PHOSPHATE 10 MG/ML IJ SOLN
INTRAMUSCULAR | Status: DC | PRN
  Administered 2024-01-24: 5 mg via INTRAVENOUS

## 2024-01-24 MED ORDER — TRANEXAMIC ACID-NACL 1000-0.7 MG/100ML-% IV SOLN
INTRAVENOUS | Status: AC
  Filled 2024-01-24: qty 100

## 2024-01-24 MED ORDER — PHENYLEPHRINE HCL-NACL 20-0.9 MG/250ML-% IV SOLN
INTRAVENOUS | Status: DC | PRN
  Administered 2024-01-24: 30 ug/min via INTRAVENOUS

## 2024-01-24 MED ORDER — OXYCODONE HCL 5 MG PO TABS: 5.0000 mg | ORAL_TABLET | ORAL | 0 refills | Status: DC | PRN

## 2024-01-24 MED ORDER — OXYCODONE HCL 5 MG PO TABS
ORAL_TABLET | ORAL | Status: AC
  Filled 2024-01-24: qty 1

## 2024-01-24 MED ORDER — PROPOFOL 10 MG/ML IV BOLUS
INTRAVENOUS | Status: DC | PRN
  Administered 2024-01-24: 200 mg via INTRAVENOUS

## 2024-01-24 MED ORDER — ROCURONIUM BROMIDE 10 MG/ML (PF) SYRINGE
PREFILLED_SYRINGE | INTRAVENOUS | Status: DC | PRN
  Administered 2024-01-24: 60 mg via INTRAVENOUS

## 2024-01-24 MED ORDER — LACTATED RINGERS IV SOLN: INTRAVENOUS | Status: DC

## 2024-01-24 MED ORDER — BUPIVACAINE HCL (PF) 0.25 % IJ SOLN
INTRAMUSCULAR | Status: AC
  Filled 2024-01-24: qty 30

## 2024-01-24 MED ORDER — MEPERIDINE HCL 25 MG/ML IJ SOLN: 6.2500 mg | INTRAMUSCULAR | Status: DC | PRN

## 2024-01-24 MED ORDER — AMISULPRIDE (ANTIEMETIC) 5 MG/2ML IV SOLN: 10.0000 mg | Freq: Once | INTRAVENOUS | Status: DC | PRN

## 2024-01-24 MED ORDER — MIDAZOLAM HCL 2 MG/2ML IJ SOLN
INTRAMUSCULAR | Status: AC
  Filled 2024-01-24: qty 2

## 2024-01-24 MED ORDER — ORAL CARE MOUTH RINSE: 15.0000 mL | Freq: Once | OROMUCOSAL | Status: AC

## 2024-01-24 MED ORDER — CHLORHEXIDINE GLUCONATE 0.12 % MT SOLN
OROMUCOSAL | Status: AC
  Administered 2024-01-24: 15 mL via OROMUCOSAL
  Filled 2024-01-24: qty 15

## 2024-01-24 MED ORDER — CEFAZOLIN SODIUM-DEXTROSE 2-4 GM/100ML-% IV SOLN
2.0000 g | INTRAVENOUS | Status: AC
Start: 2024-01-24 — End: 2024-01-24
  Administered 2024-01-24: 2 g via INTRAVENOUS

## 2024-01-24 MED ORDER — ROCURONIUM BROMIDE 10 MG/ML (PF) SYRINGE
PREFILLED_SYRINGE | INTRAVENOUS | Status: AC
  Filled 2024-01-24: qty 10

## 2024-01-24 MED ORDER — CHLORHEXIDINE GLUCONATE 0.12 % MT SOLN
15.0000 mL | Freq: Once | OROMUCOSAL | Status: AC
Start: 2024-01-24 — End: 2024-01-24

## 2024-01-24 MED ORDER — ONDANSETRON HCL 4 MG/2ML IJ SOLN
INTRAMUSCULAR | Status: AC
  Filled 2024-01-24: qty 2

## 2024-01-24 MED ORDER — CEFAZOLIN SODIUM-DEXTROSE 2-4 GM/100ML-% IV SOLN
INTRAVENOUS | Status: AC
  Filled 2024-01-24: qty 100

## 2024-01-24 MED ORDER — MIDAZOLAM HCL 2 MG/2ML IJ SOLN
INTRAMUSCULAR | Status: DC | PRN
  Administered 2024-01-24: 2 mg via INTRAVENOUS

## 2024-01-24 SURGICAL SUPPLY — 59 items
ALCOHOL 70% 16 OZ (MISCELLANEOUS) ×2 IMPLANT
BAG COUNTER SPONGE SURGICOUNT (BAG) ×2 IMPLANT
BLADE CUTTER GATOR 3.5 (BLADE) IMPLANT
BLADE EXCALIBUR 4.0X13 (MISCELLANEOUS) IMPLANT
BLADE SHAVER TORPEDO 4X13 (MISCELLANEOUS) IMPLANT
BLADE SURG 11 STRL SS (BLADE) IMPLANT
BUR OVAL 6.0 (BURR) IMPLANT
COVER SURGICAL LIGHT HANDLE (MISCELLANEOUS) ×2 IMPLANT
DRAPE INCISE IOBAN 66X45 STRL (DRAPES) ×4 IMPLANT
DRAPE STERI 35X30 U-POUCH (DRAPES) ×2 IMPLANT
DRAPE U-SHAPE 47X51 STRL (DRAPES) ×4 IMPLANT
DRSG TEGADERM 4X4.75 (GAUZE/BANDAGES/DRESSINGS) ×6 IMPLANT
DURAPREP 26ML APPLICATOR (WOUND CARE) ×2 IMPLANT
ELECTRODE REM PT RTRN 9FT ADLT (ELECTROSURGICAL) ×2 IMPLANT
FILTER STRAW FLUID ASPIR (MISCELLANEOUS) ×2 IMPLANT
GAUZE SPONGE 4X4 12PLY STRL (GAUZE/BANDAGES/DRESSINGS) ×2 IMPLANT
GAUZE XEROFORM 1X8 LF (GAUZE/BANDAGES/DRESSINGS) ×2 IMPLANT
GLOVE BIOGEL PI IND STRL 7.0 (GLOVE) ×2 IMPLANT
GLOVE BIOGEL PI IND STRL 8 (GLOVE) ×2 IMPLANT
GLOVE ECLIPSE 7.0 STRL STRAW (GLOVE) ×2 IMPLANT
GLOVE ECLIPSE 8.0 STRL XLNG CF (GLOVE) ×2 IMPLANT
GOWN STRL REUS W/ TWL LRG LVL3 (GOWN DISPOSABLE) ×6 IMPLANT
HYDROGEN PEROXIDE 16OZ (MISCELLANEOUS) ×2 IMPLANT
KIT BASIN OR (CUSTOM PROCEDURE TRAY) ×2 IMPLANT
KIT TURNOVER KIT B (KITS) ×2 IMPLANT
MANIFOLD NEPTUNE II (INSTRUMENTS) ×2 IMPLANT
NDL HYPO 25X1 1.5 SAFETY (NEEDLE) ×2 IMPLANT
NDL SCORPION MULTI FIRE (NEEDLE) IMPLANT
NDL SPNL 18GX3.5 QUINCKE PK (NEEDLE) ×2 IMPLANT
NDL SUT 6 .5 CRC .975X.05 MAYO (NEEDLE) ×2 IMPLANT
NEEDLE HYPO 25X1 1.5 SAFETY (NEEDLE) ×1 IMPLANT
NEEDLE SCORPION MULTI FIRE (NEEDLE) IMPLANT
NEEDLE SPNL 18GX3.5 QUINCKE PK (NEEDLE) ×1 IMPLANT
NS IRRIG 1000ML POUR BTL (IV SOLUTION) ×2 IMPLANT
PACK SHOULDER (CUSTOM PROCEDURE TRAY) ×2 IMPLANT
PAD ARMBOARD POSITIONER FOAM (MISCELLANEOUS) ×4 IMPLANT
PORT APPOLLO RF 90DEGREE MULTI (SURGICAL WAND) IMPLANT
RESTRAINT HEAD UNIVERSAL NS (MISCELLANEOUS) ×2 IMPLANT
SLING ARM IMMOBILIZER LRG (SOFTGOODS) IMPLANT
SOL PREP POV-IOD 4OZ 10% (MISCELLANEOUS) ×4 IMPLANT
SPONGE T-LAP 4X18 ~~LOC~~+RFID (SPONGE) ×2 IMPLANT
STRIP CLOSURE SKIN 1/2X4 (GAUZE/BANDAGES/DRESSINGS) ×2 IMPLANT
SUCTION TUBE FRAZIER 10FR DISP (SUCTIONS) ×2 IMPLANT
SUT ETHILON 3 0 PS 1 (SUTURE) ×4 IMPLANT
SUT MNCRL AB 3-0 PS2 18 (SUTURE) ×2 IMPLANT
SUT VIC AB 0 CT1 27XBRD ANBCTR (SUTURE) ×4 IMPLANT
SUT VIC AB 1 CT1 27XBRD ANBCTR (SUTURE) ×2 IMPLANT
SUT VIC AB 2-0 CT2 27 (SUTURE) ×2 IMPLANT
SUT VICRYL 0 UR6 27IN ABS (SUTURE) ×2 IMPLANT
SUTURE 2 FIBERLOOP 20 STRT BLU (SUTURE) IMPLANT
SUTURE FIBERWR #2 38 T-5 BLUE (SUTURE) IMPLANT
SYR 20ML LL LF (SYRINGE) ×4 IMPLANT
SYR 30ML LL (SYRINGE) ×2 IMPLANT
SYR TB 1ML LUER SLIP (SYRINGE) ×2 IMPLANT
TOWEL GREEN STERILE (TOWEL DISPOSABLE) ×2 IMPLANT
TOWEL GREEN STERILE FF (TOWEL DISPOSABLE) ×2 IMPLANT
TUBING ARTHROSCOPY IRRIG 16FT (MISCELLANEOUS) ×2 IMPLANT
WAND ABLATOR APOLLO I90 (BUR) IMPLANT
WATER STERILE IRR 1000ML POUR (IV SOLUTION) ×2 IMPLANT

## 2024-01-24 NOTE — Anesthesia Procedure Notes (Signed)
 Procedure Name: Intubation Date/Time: 01/24/2024 5:10 PM  Performed by: Kassi Esteve A, CRNAPre-anesthesia Checklist: Patient identified, Emergency Drugs available, Suction available and Patient being monitored Patient Re-evaluated:Patient Re-evaluated prior to induction Oxygen Delivery Method: Circle System Utilized Preoxygenation: Pre-oxygenation with 100% oxygen Induction Type: IV induction Ventilation: Mask ventilation without difficulty Laryngoscope Size: Mac and 4 Grade View: Grade III Tube type: Oral Tube size: 7.5 mm Number of attempts: 1 Airway Equipment and Method: Stylet and Oral airway Placement Confirmation: ETT inserted through vocal cords under direct vision, positive ETCO2 and breath sounds checked- equal and bilateral Secured at: 23 cm Tube secured with: Tape Dental Injury: Teeth and Oropharynx as per pre-operative assessment

## 2024-01-24 NOTE — Brief Op Note (Signed)
   01/24/2024  6:23 PM  PATIENT:  [name] Gregory  53 y.o. male  PRE-OPERATIVE DIAGNOSIS:  right frozen shoulder  POST-OPERATIVE DIAGNOSIS:  right frozen shoulder  PROCEDURE:  Procedure(s): EXAM UNDER ANESTHESIA, SHOULDER, WITH MANIPULATION ARTHROSCOPY, SHOULDER WITH DEBRIDEMENT AND ROTATOR INTERVALRELEASE  SURGEON:  Surgeon(s): Rozelle Corning, Maricela Shoe, MD  ASSISTANT: Magnant PA  ANESTHESIA:   General  EBL: 3 ml    Total I/O In: 800 [I.V.:800] Out: -   BLOOD ADMINISTERED: none  DRAINS: None  LOCAL MEDICATIONS USED: Marcaine  morphine clonidine  SPECIMEN:  No Specimen  COUNTS:  YES  TOURNIQUET:  * No tourniquets in log *  DICTATION: .Other Dictation: Dictation Number [id number]  PLAN OF CARE: Discharge to home after PACU  PATIENT DISPOSITION:  PACU - hemodynamically stable

## 2024-01-24 NOTE — Transfer of Care (Signed)
 Immediate Anesthesia Transfer of Care Note  Patient: [name] Gregory  Procedure(s) Performed: EXAM UNDER ANESTHESIA, SHOULDER, WITH MANIPULATION (Right: Shoulder) ARTHROSCOPY, SHOULDER WITH DEBRIDEMENT AND ROTATOR INTERVALRELEASE (Right: Shoulder)  Patient Location: PACU  Anesthesia Type:General  Level of Consciousness: awake, alert , and oriented  Airway & Oxygen Therapy: Patient Spontanous Breathing and Patient connected to face mask oxygen  Post-op Assessment: Report given to RN and Post -op Vital signs reviewed and stable  Post vital signs: Reviewed and stable  Last Vitals:  Vitals Value Taken Time  BP 130/97 01/24/24 18:24  Temp    Pulse 79 01/24/24 18:24  Resp 16 01/24/24 18:24  SpO2 98 % 01/24/24 18:24  Vitals shown include unfiled device data.  Last Pain:  Vitals:   01/24/24 1309  TempSrc:   PainSc: 2          Complications: No notable events documented.

## 2024-01-24 NOTE — H&P (Signed)
 [name] Gregory is an 53 y.o. male.   Chief Complaint: Right shoulder stiffness HPI: [name] Gregory is a 53 y.o. male who presents right shoulder pain and stiffness..  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  He has received cardiac risk stratification.  Is not really been evaluated by pulmonary yet for anesthesia recommendations.  Last A1c 5.7..    Past Medical History:  Diagnosis Date   Alpha thalassaemia minor    Anxiety    Carpal tunnel syndrome    CHF (congestive heart failure) (HCC)    Complication of anesthesia    WOKE UP DURING SURGERY   Diabetes (HCC)    Fatty liver    GERD (gastroesophageal reflux disease)    Hemangioma of liver    Hypertension    NICM (nonischemic cardiomyopathy) (HCC) 11/2021   Rotator cuff disorder    Sleep apnea    URI (upper respiratory infection)     Past Surgical History:  Procedure Laterality Date   BIOPSY  05/04/2016   Procedure: BIOPSY;  Surgeon: Suzette Espy, MD;  Location: [facility];  Service: Endoscopy;;  gastric bx   CARDIAC CATHETERIZATION Bilateral 10/2021   COLONOSCOPY WITH PROPOFOL  N/A 05/04/2016    PROPOFOL ;  Surgeon: Suzette Espy, MD; diverticulosis throughout the entire colon, otherwise normal.  Repeat due in 2027.   ESOPHAGOGASTRODUODENOSCOPY (EGD) WITH PROPOFOL  N/A 05/04/2016   PROPOFOL ;  Surgeon: Suzette Espy, MD; LA grade B esophagitis, gastritis, small hiatal hernia.   EYE SURGERY     RIGHT HEART CATH Right 06/28/2022   Procedure: RIGHT HEART CATH;  Surgeon: Mardell Shade, MD;  Location: [facility];  Service: Cardiovascular;  Laterality: Right;   SALIVARY GLAND SURGERY  1992    Family History  Problem Relation Age of Onset   Hypertension Mother    CVA Father    Diabetes Father    Throat cancer Father    Heart disease Brother 12   Cervical cancer Maternal Grandmother    Throat cancer Maternal Grandmother    Throat cancer Maternal Grandfather    Bladder Cancer Maternal Grandfather     Cirrhosis Maternal Grandfather    Colon cancer Neg Hx    Social History:  reports that he quit smoking about 2 years ago. His smoking use included cigarettes. He started smoking about 38 years ago. He has a 9 pack-year smoking history. He has never used smokeless tobacco. He reports that he does not currently use alcohol. He reports that he does not use drugs.  Allergies: No Known Allergies  Medications Prior to Admission  Medication Sig Dispense Refill   albuterol  (VENTOLIN  HFA) 108 (90 Base) MCG/ACT inhaler Inhale 2 puffs into the lungs every 6 (six) hours as needed for wheezing or shortness of breath. 8 g 2   amLODipine (NORVASC) 10 MG tablet Take 10 mg by mouth daily.     ARIPiprazole (ABILIFY) 2 MG tablet Take 2 mg by mouth daily.     atorvastatin  (LIPITOR) 20 MG tablet TAKE ONE TABLET BY MOUTH EVERY DAY 90 tablet 3   buPROPion  (WELLBUTRIN  XL) 150 MG 24 hr tablet Take 150 mg by mouth 2 (two) times daily.     carvedilol  (COREG ) 25 MG tablet Take 1 tablet (25 mg total) by mouth 2 (two) times daily with a meal. 180 tablet 3   citalopram  (CELEXA ) 20 MG tablet Take 1 tablet by mouth daily.     CREON 36000-114000 units CPEP capsule Take 36,000 Units  by mouth 3 (three) times daily before meals.     dicyclomine (BENTYL) 10 MG capsule Take 10 mg by mouth in the morning and at bedtime.     erythromycin (E-MYCIN) 250 MG tablet Take 250 mg by mouth 3 (three) times daily.     famotidine (PEPCID) 20 MG tablet Take 20 mg by mouth 2 (two) times daily.     ferrous sulfate  325 (65 FE) MG EC tablet Take 1 tablet (325 mg total) by mouth daily with breakfast. 30 tablet 3   LORazepam  (ATIVAN ) 0.5 MG tablet Take 0.5 mg by mouth every 8 (eight) hours as needed for anxiety.      magnesium oxide (MAG-OX) 400 (240 Mg) MG tablet Take 400 mg by mouth daily.     metFORMIN  (GLUCOPHAGE ) 1000 MG tablet Take 1 tablet (1,000 mg total) by mouth 2 (two) times daily with a meal. 180 tablet 3   metoCLOPramide  (REGLAN ) 10 MG  tablet Take 10 mg by mouth 3 (three) times daily.     omeprazole (PRILOSEC) 40 MG capsule Take 40 mg by mouth daily.     ondansetron  (ZOFRAN -ODT) 4 MG disintegrating tablet Take 4 mg by mouth 2 (two) times daily.     pregabalin (LYRICA) 50 MG capsule Take 50 mg by mouth 3 (three) times daily.     sacubitril-valsartan  (ENTRESTO ) 49-51 MG Take 1 tablet by mouth 2 (two) times daily. 60 tablet 5   spironolactone  (ALDACTONE ) 25 MG tablet Take 0.5 tablets (12.5 mg total) by mouth daily.     sucralfate (CARAFATE) 1 g tablet Take 1 g by mouth 2 (two) times daily.     Torsemide  40 MG TABS Take 60 mg by mouth 2 (two) times daily. 270 tablet 1   celecoxib  (CELEBREX ) 100 MG capsule Take 1 capsule (100 mg total) by mouth 2 (two) times daily. 60 capsule 0   glipiZIDE  (GLUCOTROL  XL) 5 MG 24 hr tablet Take 5 mg by mouth every morning.     JARDIANCE  10 MG TABS tablet Take 1 tablet (10 mg total) by mouth daily. 90 tablet 3   PEG 3350-KCl-NaBcb-NaCl-NaSulf (PEG-3350/ELECTROLYTES) 236 g SOLR Take by mouth as directed.     tirzepatide  (MOUNJARO ) 10 MG/0.5ML Pen Inject 10 mg into the skin once a week. 6 mL 1   Vitamin D , Ergocalciferol , (DRISDOL) 50000 units CAPS capsule Take 1 capsule by mouth 3 (three) times a week.      Results for orders placed or performed during the hospital encounter of 01/24/24 (from the past 48 hours)  Glucose, capillary     Status: Abnormal   Collection Time: 01/24/24 12:35 PM  Result Value Ref Range   Glucose-Capillary 135 (H) 70 - 99 mg/dL    Comment: Glucose reference range applies only to samples taken after fasting for at least 8 hours.  Basic metabolic panel     Status: Abnormal   Collection Time: 01/24/24 12:37 PM  Result Value Ref Range   Sodium 135 135 - 145 mmol/L   Potassium 4.5 3.5 - 5.1 mmol/L   Chloride 101 98 - 111 mmol/L   CO2 25 22 - 32 mmol/L   Glucose, Bld 120 (H) 70 - 99 mg/dL    Comment: Glucose reference range applies only to samples taken after fasting for at  least 8 hours.   BUN 23 (H) 6 - 20 mg/dL   Creatinine, Ser 8.29 0.61 - 1.24 mg/dL   Calcium  9.2 8.9 - 10.3 mg/dL   GFR, Estimated >56 >21  mL/min    Comment: (NOTE) Calculated using the CKD-EPI Creatinine Equation (2021)    Anion gap 9 5 - 15    Comment: Performed at [facility], [address], [place], [place] [address]  CBC     Status: Abnormal   Collection Time: 01/24/24 12:37 PM  Result Value Ref Range   WBC 9.1 4.0 - 10.5 K/uL   RBC 4.75 4.22 - 5.81 MIL/uL   Hemoglobin 10.9 (L) 13.0 - 17.0 g/dL   HCT 60.4 (L) 54.0 - 98.1 %   MCV 73.7 (L) 80.0 - 100.0 fL   MCH 22.9 (L) 26.0 - 34.0 pg   MCHC 31.1 30.0 - 36.0 g/dL   RDW 19.1 47.8 - 29.5 %   Platelets 304 150 - 400 K/uL   nRBC 0.0 0.0 - 0.2 %    Comment: Performed at [facility], [address], [place], [place] [address]   No results found.  Review of Systems  Musculoskeletal:  Positive for arthralgias.  All other systems reviewed and are negative.   Blood pressure 115/82, pulse 79, temperature 97.9 F (36.6 C), temperature source Oral, resp. rate 18, height 6' 2 (1.88 m), weight (!) 140.2 kg, SpO2 97%. Physical Exam Vitals reviewed.  HENT:     Head: Normocephalic.     Nose: Nose normal.     Mouth/Throat:     Mouth: Mucous membranes are moist.   Eyes:     Pupils: Pupils are equal, round, and reactive to light.    Cardiovascular:     Rate and Rhythm: Normal rate.     Pulses: Normal pulses.  Pulmonary:     Effort: Pulmonary effort is normal.  Abdominal:     General: Abdomen is flat.   Musculoskeletal:     Cervical back: Normal range of motion.   Skin:    General: Skin is warm.     Capillary Refill: Capillary refill takes less than 2 seconds.   Neurological:     General: No focal deficit present.     Mental Status: He is alert.   Psychiatric:        Mood and Affect: Mood normal.    Ortho exam demonstrates range of motion on the right of 10/70/85. Has good cervical spine range of  motion. Motor or sensory function to the right hand is intact. Rotator cuff strength is pretty reasonable to internal/external rotation at 15 degrees of abduction.   Assessment/Plan Impression is bilateral shoulder adhesive capsulitis. Range of motion restricted on the right. Patient is diabetic and this is longstanding. Will be difficult to maintain any range of motion gains we get with surgery. Surgical plan would be arthroscopy with manipulation rotator interval release and starting physical therapy the day after surgery. He would do best with a shoulder CPM machine but does not covered by insurance so it is unlikely that that will happen. Would be good for him not to receive an interscalene block out of concern for the phrenic nerve which if compromise could more severely affected his pulmonary function than most patients.  All questions answered.  Is important that he start moving the shoulder right away including a home exercise program.  If not then the surgery may not be a very much benefit for him.  Marykay Snipes, MD 01/24/2024, 4:05 PM

## 2024-01-25 ENCOUNTER — Other Ambulatory Visit: Payer: Self-pay

## 2024-01-25 ENCOUNTER — Ambulatory Visit (HOSPITAL_COMMUNITY): Payer: PRIVATE HEALTH INSURANCE | Attending: Orthopedic Surgery | Admitting: Occupational Therapy

## 2024-01-25 ENCOUNTER — Encounter (HOSPITAL_COMMUNITY): Payer: Self-pay | Admitting: Orthopedic Surgery

## 2024-01-25 DIAGNOSIS — M25511 Pain in right shoulder: Secondary | ICD-10-CM | POA: Insufficient documentation

## 2024-01-25 DIAGNOSIS — R29898 Other symptoms and signs involving the musculoskeletal system: Secondary | ICD-10-CM | POA: Diagnosis present

## 2024-01-25 DIAGNOSIS — M25611 Stiffness of right shoulder, not elsewhere classified: Secondary | ICD-10-CM | POA: Insufficient documentation

## 2024-01-25 NOTE — Therapy (Signed)
 OUTPATIENT OCCUPATIONAL THERAPY ORTHO EVALUATION  Patient Name: [name] Gregory MRN: [id number] DOB:1971/07/24, 53 y.o., male Today's Date: 01/25/2024   END OF SESSION:  OT End of Session - 01/25/24 1020     Visit Number 1    Number of Visits 24    Date for OT Re-Evaluation 03/25/24   progress note 02/23/24   Authorization Type Generic commercial    OT Start Time 0930    OT Stop Time 1016    OT Time Calculation (min) 46 min    Activity Tolerance Patient tolerated treatment well    Behavior During Therapy WFL for tasks assessed/performed          Past Medical History:  Diagnosis Date   Alpha thalassaemia minor    Anxiety    Carpal tunnel syndrome    CHF (congestive heart failure) (HCC)    Complication of anesthesia    WOKE UP DURING SURGERY   Diabetes (HCC)    Fatty liver    GERD (gastroesophageal reflux disease)    Hemangioma of liver    Hypertension    NICM (nonischemic cardiomyopathy) (HCC) 11/2021   Rotator cuff disorder    Sleep apnea    URI (upper respiratory infection)    Past Surgical History:  Procedure Laterality Date   BIOPSY  05/04/2016   Procedure: BIOPSY;  Surgeon: Suzette Espy, MD;  [facility]: [facility];  Service: Endoscopy;;  gastric bx   CARDIAC CATHETERIZATION Bilateral 10/2021   COLONOSCOPY WITH PROPOFOL  N/A 05/04/2016    PROPOFOL ;  Surgeon: Suzette Espy, MD; diverticulosis throughout the entire colon, otherwise normal.  Repeat due in 2027.   ESOPHAGOGASTRODUODENOSCOPY (EGD) WITH PROPOFOL  N/A 05/04/2016   PROPOFOL ;  Surgeon: Suzette Espy, MD; LA grade B esophagitis, gastritis, small hiatal hernia.   EXAM UNDER ANESTHESIA WITH MANIPULATION OF SHOULDER Right 01/24/2024   Procedure: EXAM UNDER ANESTHESIA, SHOULDER, WITH MANIPULATION;  Surgeon: Jasmine Mesi, MD;  Location: [facility] OR;  Service: Orthopedics;  Laterality: Right;  RIGHT SHOULDER MANIPULATION UNDER ANESTHESIA, ARTHROSCOPY, DEBRIDEMENT, ROTATOR INTERVAL RELEASE   EYE SURGERY      POSTERIOR LUMBAR FUSION 2 WITH HARDWARE REMOVAL Right 01/24/2024   Procedure: ARTHROSCOPY, SHOULDER WITH DEBRIDEMENT AND ROTATOR INTERVALRELEASE;  Surgeon: Jasmine Mesi, MD;  Location: [facility] OR;  Service: Orthopedics;  Laterality: Right;   RIGHT HEART CATH Right 06/28/2022   Procedure: RIGHT HEART CATH;  Surgeon: Mardell Shade, MD;  Location: [facility];  Service: Cardiovascular;  Laterality: Right;   SALIVARY GLAND SURGERY  1992   Patient Active Problem List   Diagnosis Date Noted   Hx of smoking 10/12/2023   Lymphedema 03/26/2023   Bilateral sensorineural hearing loss 01/04/2023   Chronic respiratory failure with hypoxia (HCC) 01/02/2022   HTN (hypertension) 06/10/2020   Loose stools 06/09/2020   GERD (gastroesophageal reflux disease) 06/09/2020   Elevated LFTs 06/09/2020   Early satiety 06/09/2020   Fatty liver 06/09/2020   Dizziness    Hyperglycemia    Liver enzyme elevation    Nausea vomiting and diarrhea    Transaminitis    AKI (acute kidney injury) (HCC) 07/17/2019   Abdominal pain 07/17/2019   Nasal congestion 07/17/2019   Post-nasal drainage 07/17/2019   OSA on CPAP 06/14/2017   Morbid (severe) obesity due to excess calories (HCC) 06/14/2017   Abdominal pain, epigastric 04/24/2016   RUQ pain 04/24/2016   Microcytic anemia 04/24/2016   Diarrhea 04/24/2016   Elevated sed rate 04/24/2016   Splenomegaly 04/24/2016  PCP: Dr. Twylla Galen  REFERRING PROVIDER: Dr. Jasmine Mesi  ONSET DATE: 6//12/25  REFERRING DIAG: M25.511 (ICD-10-CM) - Acute pain of right shoulder   THERAPY DIAG:  Acute pain of right shoulder  Stiffness of right shoulder, not elsewhere classified  Other symptoms and signs involving the musculoskeletal system  Rationale for Evaluation and Treatment: Rehabilitation  SUBJECTIVE:   SUBJECTIVE STATEMENT: S: If I pick up something it shoots through.  Pt accompanied by: self  PERTINENT HISTORY: Pt is a 53 y/o male  s/p right shoulder arthroscopic debridement and rotator interval release as well as manipulation on 01/24/24. 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 Pt presents in standard sling for evaluation.   PRECAUTIONS: Shoulder  WEIGHT BEARING RESTRICTIONS: No  PAIN:  Are you having pain? Yes: NPRS scale: 6/10 Pain location: right shoulder Pain description: aching like a toothache Aggravating factors: moving it out to the side Relieving factors: nothing  FALLS: Has patient fallen in last 6 months? No  PLOF: Independent  PATIENT GOALS: To have less pain and more shoulder mobility  NEXT MD VISIT: 01/31/24  OBJECTIVE:   HAND DOMINANCE: Right  ADLs: Overall ADLs: Pt reports difficulty with reaching overhead, behind back, and across body. Has difficulty with putting belt on, bathing, trying to lift items and try to transfer it. Sleeping is horrible, can only sleep on his back.   FUNCTIONAL OUTCOME MEASURES: Upper Extremity Functional Scale (UEFS): 13/80=16%  UPPER EXTREMITY ROM:       Assessed in supine, er/IR adducted  Passive ROM Right eval  Shoulder flexion 35  Shoulder abduction 55  Shoulder internal rotation 90  Shoulder external rotation -8  (Blank rows = not tested)    UPPER EXTREMITY MMT:     Not assessed at evaluation due to precautions/pain  MMT Right eval  Shoulder flexion   Shoulder abduction   Shoulder internal rotation   Shoulder external rotation   (Blank rows = not tested)   SENSATION: WFL  EDEMA: mild secondary to sx yesterday  COGNITION: Overall cognitive status: Within functional limits for tasks assessed  OBSERVATIONS: mod fascial restrictions in right upper arm, anterior shoulder, and trapezius regions   TODAY'S TREATMENT:                                                                                                                              DATE:  -Manual techniques: retrograde massage and myofascial release to  right upper arm, anterior shoulder, trapezius, and scapular regions to decrease edema, pain, and fascial restrictions and increase joint ROM -Pendulums: side to side, forward/back, circles, 1' each     PATIENT EDUCATION: Education details: pendulums, wrist A/ROM Person educated: Patient Education method: Explanation, Demonstration, and Handouts Education comprehension: verbalized understanding and returned demonstration  HOME EXERCISE PROGRAM: Eval: pendulums, wrist A/ROM  GOALS: Goals reviewed with patient? Yes   SHORT TERM GOALS: Target date: 02/24/24  Pt will be provided with  and educated on HEP to improve mobility in RUE required for use during ADL completion.   Goal status: INITIAL  2.  Pt will increase RUE P/ROM by 75 degrees to improve ability to use RUE during dressing tasks with minimal compensatory techniques.   Goal status: INITIAL  3.  Pt will increase RUE strength to 3+/5 to improve ability to reach for items at waist to chest height during bathing and grooming tasks.   Goal status: INITIAL    LONG TERM GOALS: Target date: 03/26/24  Pt will decrease pain in RUE to 3/10 or less to improve ability to sleep for 2+ consecutive hours without waking due to pain.   Goal status: INITIAL  2.  Pt will decrease RUE fascial restrictions to min amounts or less to improve mobility required for functional reaching tasks.   Goal status: INITIAL  3.  Pt will increase RUE A/ROM by 75+ degrees to improve ability to use RUE when reaching overhead or behind back during dressing and bathing tasks.   Goal status: INITIAL  4.  Pt will increase RUE strength to 4+/5 or greater to improve ability to use RUE when lifting or carrying items during meal preparation/housework/yardwork tasks.   Goal status: INITIAL    ASSESSMENT:  CLINICAL IMPRESSION: Patient is a 53 y.o. male who was seen today for occupational therapy evaluation s/p right shoulder athroscopy and manipulation on  01/24/24. Pt presents with increased pain and fascial restrictions, decreased ROM, strength, and functional use of the RUE. Pt with mod guarding and very limited tolerance to passive stretching.    PERFORMANCE DEFICITS: in functional skills including in functional skills including ADLs, IADLs, coordination, tone, ROM, strength, pain, fascial restrictions, muscle spasms, and UE functional use  IMPAIRMENTS: are limiting patient from ADLs, IADLs, rest and sleep, work, and leisure.   COMORBIDITIES: may have co-morbidities  that affects occupational performance. Patient will benefit from skilled OT to address above impairments and improve overall function.  MODIFICATION OR ASSISTANCE TO COMPLETE EVALUATION: Min-Moderate modification of tasks or assist with assess necessary to complete an evaluation.  OT OCCUPATIONAL PROFILE AND HISTORY: Problem focused assessment: Including review of records relating to presenting problem.  CLINICAL DECISION MAKING: LOW - limited treatment options, no task modification necessary  REHAB POTENTIAL: Good  EVALUATION COMPLEXITY: Low      PLAN:  OT FREQUENCY: 3x/week  OT DURATION: 8 weeks  PLANNED INTERVENTIONS: 97168 OT Re-evaluation, 97535 self care/ADL training, [address]109 therapeutic exercise, 97530 therapeutic activity, 97112 neuromuscular re-education, 97140 manual therapy, 97035 ultrasound, 97014 electrical stimulation unattended, patient/family education, and DME and/or AE instructions  RECOMMENDED OTHER SERVICES: None at this time  CONSULTED AND AGREED WITH PLAN OF CARE: Patient  PLAN FOR NEXT SESSION: Follow up on HEP, manual techniques, passive stretching, scapular mobility, progressing to AA/ROM as tolerated   Lafonda Piety, OTR/L  [phone] 01/25/2024, 10:45 AM

## 2024-01-25 NOTE — Op Note (Signed)
 NAME: Szymanowski, Kenson D. MEDICAL RECORD NO: [id number] ACCOUNT NO: [id number] DATE OF BIRTH: 06-25-1971 FACILITY: [facility] LOCATION: [facility] PHYSICIAN: Gloriann Larger. Rozelle Corning, MD  Operative Report   PREOPERATIVE DIAGNOSIS:  Right frozen shoulder.  POSTOPERATIVE DIAGNOSIS:  Right frozen shoulder.  PROCEDURE:  Right shoulder manipulation under anesthesia with arthroscopy and rotator interval release.  SURGEON:  Gloriann Larger. Rozelle Corning, MD.  ASSISTANT:  Prentis Brock, PA.  INDICATIONS:  The patient is a 53 year old with a long history of diabetes as well as frozen shoulder who presents for operative management after explanation of risk and benefit.   PROCEDURE IN DETAIL:  The patient was brought to the operating room where general endotracheal anesthesia was induced.  Preoperative antibiotics were administered.  Timeout was called.  Right shoulder was examined under anesthesia.  He was found to have  range of motion of 10/90/160. Shoulder was manipulated, but we did not really get any tearing with forward flexion or abduction.  In general, these ranges of motion were full or near full. External rotation was improved after the rotator interval  release. The patient was placed in the beach chair position.  The right arm and hand after calling the timeout prior to the manipulation, the patient was positioned in the beach chair position with the head in neutral position.  Right shoulder, arm, and  hand were prescrubbed with alcohol and Betadine, allowed the air to dry, prepped with DuraPrep solution and draped in a sterile manner.  An Ioban was used to seal the operative field.  Posterior portal was created.  Anterior portal was created under  direct visualization.  Diagnostic arthroscopy demonstrated some fraying around the biceps anchor, but there was stability.  There was a lot of synovitis within the shoulder joint.  This was debrided using a shaver and ArthroCare wand.  Rotator cuff was  intact. Anterior, inferior,  and posterior inferior glenohumeral ligaments were intact.  At this time, after using a shaver to debride the synovitis, the ArthroCare wand was used to release the rotator interval from the base of the coracoid down to the  biceps tendon.  This improved external rotation from 10 degrees to about 50 degrees.  Thorough irrigation of the joint was performed.  A needle was placed into the joint and that joint was filled with Marcaine , morphine , and clonidine after we closed up  the portals using Vicryl and nylon.  Impervious dressings and a shoulder immobilizer were placed.  He will discontinue the shoulder immobilizer tomorrow.  He will start range of motion exercises tomorrow to allow portals to heal. Follow up in 1 week for  clinical recheck. Luke's assistance was required for opening, closing, and manipulation. His assistance was of medical necessity.   NIK D: 01/24/2024 6:27:17 pm T: 01/25/2024 1:01:00 am  JOB: [id number]/ [id number]

## 2024-01-25 NOTE — Anesthesia Postprocedure Evaluation (Signed)
 Anesthesia Post Note  Patient: [name] Gregory  Procedure(s) Performed: EXAM UNDER ANESTHESIA, SHOULDER, WITH MANIPULATION (Right: Shoulder) ARTHROSCOPY, SHOULDER WITH DEBRIDEMENT AND ROTATOR INTERVALRELEASE (Right: Shoulder)     Patient location during evaluation: PACU Anesthesia Type: General Level of consciousness: awake and alert Pain management: pain level controlled Vital Signs Assessment: post-procedure vital signs reviewed and stable Respiratory status: spontaneous breathing, nonlabored ventilation, respiratory function stable and patient connected to nasal cannula oxygen Cardiovascular status: blood pressure returned to baseline and stable Postop Assessment: no apparent nausea or vomiting Anesthetic complications: no   No notable events documented.  Last Vitals:  Vitals:   01/24/24 1900 01/24/24 1915  BP: 124/87 127/85  Pulse: 79 84  Resp: 14 16  Temp:  36.8 C  SpO2: 93% 93%    Last Pain:  Vitals:   01/24/24 1915  TempSrc:   PainSc: 5                  Cai Anfinson S

## 2024-01-25 NOTE — Patient Instructions (Addendum)
  1) ROM: Pendulum (Side-to-Side)    Bend forward 90 at waist, using table for support. Rock body side to side to swing arm. Complete for 1 minute   Copyright  VHI. All rights reserved.  2) Pendulum Forward/Back   Bend forward 90 at waist, using table for support. Rock body forward and back to swing arm. Complete for 1 minute   Copyright  VHI. All rights reserved.  3) Pendulum Circular   Bend forward 90 at waist, leaning on table for support. Rock body in a circular pattern to move arm clockwise then counter clockwise. 1 minute each.   Copyright  VHI. All rights reserved.  4) AROM: Wrist Extension   With right palm down, bend wrist up. Repeat 10____ times per set. Do __3__ sessions per day.  Copyright  VHI. All rights reserved.   5) AROM: Wrist Flexion   With right palm up, bend wrist up. Repeat ___10_ times per set. Do __3__ sessions per day.  Copyright  VHI. All rights reserved.   6) AROM: Forearm Pronation / Supination   With right arm in handshake position, slowly rotate palm down until stretch is felt. Relax. Then rotate palm up until stretch is felt. Repeat __10__ times per set.  Do __3__ sessions per day.  Copyright  VHI. All rights reserved.   7) AFlexion (Passive)   Use other hand to bend elbow, with thumb toward same shoulder. Do NOT force this motion.  Repeat 10-15 times. Do 3 sessions per day.

## 2024-01-26 DIAGNOSIS — M7501 Adhesive capsulitis of right shoulder: Secondary | ICD-10-CM

## 2024-01-28 ENCOUNTER — Ambulatory Visit (HOSPITAL_COMMUNITY): Payer: PRIVATE HEALTH INSURANCE | Admitting: Occupational Therapy

## 2024-01-28 ENCOUNTER — Encounter (HOSPITAL_COMMUNITY): Payer: Self-pay | Admitting: Occupational Therapy

## 2024-01-28 DIAGNOSIS — M25611 Stiffness of right shoulder, not elsewhere classified: Secondary | ICD-10-CM

## 2024-01-28 DIAGNOSIS — M25511 Pain in right shoulder: Secondary | ICD-10-CM

## 2024-01-28 DIAGNOSIS — R29898 Other symptoms and signs involving the musculoskeletal system: Secondary | ICD-10-CM

## 2024-01-28 NOTE — Therapy (Signed)
 OUTPATIENT OCCUPATIONAL THERAPY ORTHO TREATMENT  Patient Name: [name] Gregory MRN: [id number] DOB:1970-10-26, 53 y.o., male Today's Date: 01/28/2024   END OF SESSION:  OT End of Session - 01/28/24 1215     Visit Number 2    Number of Visits 24    Date for OT Re-Evaluation 03/25/24   progress note 02/23/24   Authorization Type Generic commercial    OT Start Time 1153    OT Stop Time 1218    OT Time Calculation (min) 25 min    Activity Tolerance Patient tolerated treatment well    Behavior During Therapy WFL for tasks assessed/performed           Past Medical History:  Diagnosis Date   Alpha thalassaemia minor    Anxiety    Carpal tunnel syndrome    CHF (congestive heart failure) (HCC)    Complication of anesthesia    WOKE UP DURING SURGERY   Diabetes (HCC)    Fatty liver    GERD (gastroesophageal reflux disease)    Hemangioma of liver    Hypertension    NICM (nonischemic cardiomyopathy) (HCC) 11/2021   Rotator cuff disorder    Sleep apnea    URI (upper respiratory infection)    Past Surgical History:  Procedure Laterality Date   BIOPSY  05/04/2016   Procedure: BIOPSY;  Surgeon: Suzette Espy, MD;  Location: [facility];  Service: Endoscopy;;  gastric bx   CARDIAC CATHETERIZATION Bilateral 10/2021   COLONOSCOPY WITH PROPOFOL  N/A 05/04/2016    PROPOFOL ;  Surgeon: Suzette Espy, MD; diverticulosis throughout the entire colon, otherwise normal.  Repeat due in 2027.   ESOPHAGOGASTRODUODENOSCOPY (EGD) WITH PROPOFOL  N/A 05/04/2016   PROPOFOL ;  Surgeon: Suzette Espy, MD; LA grade B esophagitis, gastritis, small hiatal hernia.   EXAM UNDER ANESTHESIA WITH MANIPULATION OF SHOULDER Right 01/24/2024   Procedure: EXAM UNDER ANESTHESIA, SHOULDER, WITH MANIPULATION;  Surgeon: Jasmine Mesi, MD;  Location: [facility] OR;  Service: Orthopedics;  Laterality: Right;  RIGHT SHOULDER MANIPULATION UNDER ANESTHESIA, ARTHROSCOPY, DEBRIDEMENT, ROTATOR INTERVAL RELEASE   EYE SURGERY      POSTERIOR LUMBAR FUSION 2 WITH HARDWARE REMOVAL Right 01/24/2024   Procedure: ARTHROSCOPY, SHOULDER WITH DEBRIDEMENT AND ROTATOR INTERVALRELEASE;  Surgeon: Jasmine Mesi, MD;  Location: [facility] OR;  Service: Orthopedics;  Laterality: Right;   RIGHT HEART CATH Right 06/28/2022   Procedure: RIGHT HEART CATH;  Surgeon: Mardell Shade, MD;  Location: [facility];  Service: Cardiovascular;  Laterality: Right;   SALIVARY GLAND SURGERY  1992   Patient Active Problem List   Diagnosis Date Noted   Adhesive capsulitis of right shoulder 01/26/2024   Hx of smoking 10/12/2023   Lymphedema 03/26/2023   Bilateral sensorineural hearing loss 01/04/2023   Chronic respiratory failure with hypoxia (HCC) 01/02/2022   HTN (hypertension) 06/10/2020   Loose stools 06/09/2020   GERD (gastroesophageal reflux disease) 06/09/2020   Elevated LFTs 06/09/2020   Early satiety 06/09/2020   Fatty liver 06/09/2020   Dizziness    Hyperglycemia    Liver enzyme elevation    Nausea vomiting and diarrhea    Transaminitis    AKI (acute kidney injury) (HCC) 07/17/2019   Abdominal pain 07/17/2019   Nasal congestion 07/17/2019   Post-nasal drainage 07/17/2019   OSA on CPAP 06/14/2017   Morbid (severe) obesity due to excess calories (HCC) 06/14/2017   Abdominal pain, epigastric 04/24/2016   RUQ pain 04/24/2016   Microcytic anemia 04/24/2016   Diarrhea 04/24/2016  Elevated sed rate 04/24/2016   Splenomegaly 04/24/2016    PCP: Dr. Twylla Galen  REFERRING PROVIDER: Dr. Jasmine Mesi  ONSET DATE: 6//12/25  REFERRING DIAG: M25.511 (ICD-10-CM) - Acute pain of right shoulder   THERAPY DIAG:  Acute pain of right shoulder  Stiffness of right shoulder, not elsewhere classified  Other symptoms and signs involving the musculoskeletal system  Rationale for Evaluation and Treatment: Rehabilitation  SUBJECTIVE:   SUBJECTIVE STATEMENT: S: It was bad after that first day on Friday.   PERTINENT  HISTORY: Pt is a 53 y/o male s/p right shoulder arthroscopic debridement and rotator interval release as well as manipulation on 01/24/24. 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 Pt presents in standard sling for evaluation.   PRECAUTIONS: Shoulder  WEIGHT BEARING RESTRICTIONS: No  PAIN:  Are you having pain? Yes: NPRS scale: 5/10 Pain location: right shoulder Pain description: aching like a toothache Aggravating factors: moving it out to the side Relieving factors: nothing  FALLS: Has patient fallen in last 6 months? No  PLOF: Independent  PATIENT GOALS: To have less pain and more shoulder mobility  NEXT MD VISIT: 01/31/24  OBJECTIVE:   HAND DOMINANCE: Right  ADLs: Overall ADLs: Pt reports difficulty with reaching overhead, behind back, and across body. Has difficulty with putting belt on, bathing, trying to lift items and try to transfer it. Sleeping is horrible, can only sleep on his back.   FUNCTIONAL OUTCOME MEASURES: Upper Extremity Functional Scale (UEFS): 13/80=16%  UPPER EXTREMITY ROM:       Assessed in supine, er/IR adducted  Passive ROM Right eval  Shoulder flexion 35  Shoulder abduction 55  Shoulder internal rotation 90  Shoulder external rotation -8  (Blank rows = not tested)    UPPER EXTREMITY MMT:     Not assessed at evaluation due to precautions/pain  MMT Right eval  Shoulder flexion   Shoulder abduction   Shoulder internal rotation   Shoulder external rotation   (Blank rows = not tested)  EDEMA: mild secondary to sx 6/12  OBSERVATIONS: mod fascial restrictions in right upper arm, anterior shoulder, and trapezius regions   TODAY'S TREATMENT:                                                                                                                              DATE:  01/28/24 -Manual techniques: retrograde massage and myofascial release to right upper arm, anterior shoulder, trapezius, and scapular regions to  decrease edema, pain, and fascial restrictions and increase joint ROM -P/ROM: supine-flexion, abduction, er, IR, 10 reps -Scapular A/ROM: seated-elevation/depression, 10 reps -Pendulums: side to side, forward/back, circles, 1' each  -A/ROM: elbow flexion/extension, forearm supination/pronation, 10 reps  Eval -Manual techniques: retrograde massage and myofascial release to right upper arm, anterior shoulder, trapezius, and scapular regions to decrease edema, pain, and fascial restrictions and increase joint ROM -Pendulums: side to side, forward/back, circles, 1' each  PATIENT EDUCATION: Education details: reviewed HEP Person educated: Patient Education method: Explanation, Demonstration, and Handouts Education comprehension: verbalized understanding and returned demonstration  HOME EXERCISE PROGRAM: Eval: pendulums, wrist A/ROM  GOALS: Goals reviewed with patient? Yes   SHORT TERM GOALS: Target date: 02/24/24  Pt will be provided with and educated on HEP to improve mobility in RUE required for use during ADL completion.   Goal status: IN PROGRESS  2.  Pt will increase RUE P/ROM by 75 degrees to improve ability to use RUE during dressing tasks with minimal compensatory techniques.   Goal status: IN PROGRESS  3.  Pt will increase RUE strength to 3+/5 to improve ability to reach for items at waist to chest height during bathing and grooming tasks.   Goal status: IN PROGRESS    LONG TERM GOALS: Target date: 03/26/24  Pt will decrease pain in RUE to 3/10 or less to improve ability to sleep for 2+ consecutive hours without waking due to pain.   Goal status: IN PROGRESS  2.  Pt will decrease RUE fascial restrictions to min amounts or less to improve mobility required for functional reaching tasks.   Goal status: IN PROGRESS  3.  Pt will increase RUE A/ROM by 75+ degrees to improve ability to use RUE when reaching overhead or behind back during dressing and bathing tasks.    Goal status: IN PROGRESS  4.  Pt will increase RUE strength to 4+/5 or greater to improve ability to use RUE when lifting or carrying items during meal preparation/housework/yardwork tasks.   Goal status: IN PROGRESS    ASSESSMENT:  CLINICAL IMPRESSION: Pt reports a lot of soreness after evaluation on 6/13, HEP is going well. Completed manual techniques with good soft tissue response. Improved tolerance to P/ROM with pt tolerating ROM to approximately 70 degrees for flexion and abduction, min guarding. External rotation is the most limited at neutral today. Continued with pendulums and added gentle scapular elevation/depression. Ended session a bit early due to precautions as pt is less than 1 week out of surgery. Verbal cuing for form and technique today.     PERFORMANCE DEFICITS: in functional skills including in functional skills including ADLs, IADLs, coordination, tone, ROM, strength, pain, fascial restrictions, muscle spasms, and UE functional use      PLAN:  OT FREQUENCY: 3x/week  OT DURATION: 8 weeks  PLANNED INTERVENTIONS: 97168 OT Re-evaluation, 97535 self care/ADL training, 40981 therapeutic exercise, 97530 therapeutic activity, 97112 neuromuscular re-education, 97140 manual therapy, 97035 ultrasound, 97014 electrical stimulation unattended, patient/family education, and DME and/or AE instructions  RECOMMENDED OTHER SERVICES: None at this time  CONSULTED AND AGREED WITH PLAN OF CARE: Patient  PLAN FOR NEXT SESSION: Follow up on HEP, manual techniques, passive stretching, scapular mobility, progressing to AA/ROM as tolerated   Lafonda Piety, OTR/L  [phone] 01/28/2024, 12:18 PM

## 2024-01-29 MED FILL — Clonidine HCl Inj (For Epidural Infusion) 100 MCG/ML: EPIDURAL | Qty: 1 | Status: AC

## 2024-01-31 ENCOUNTER — Other Ambulatory Visit: Payer: Self-pay | Admitting: Orthopedic Surgery

## 2024-01-31 ENCOUNTER — Ambulatory Visit (INDEPENDENT_AMBULATORY_CARE_PROVIDER_SITE_OTHER): Payer: PRIVATE HEALTH INSURANCE | Admitting: Orthopedic Surgery

## 2024-01-31 ENCOUNTER — Encounter: Payer: Self-pay | Admitting: Orthopedic Surgery

## 2024-01-31 DIAGNOSIS — M25511 Pain in right shoulder: Secondary | ICD-10-CM

## 2024-01-31 MED ORDER — OXYCODONE HCL 5 MG PO TABS: 5.0000 mg | ORAL_TABLET | Freq: Four times a day (QID) | ORAL | 0 refills | Status: DC | PRN

## 2024-01-31 NOTE — Progress Notes (Signed)
 Post-Op Visit Note   Patient: [name] Gregory           Date of Birth: 12-25-1970           MRN: [id number] Visit Date: 01/31/2024 PCP: Twylla Galen, MD   Assessment & Plan:  Chief Complaint:  Chief Complaint  Patient presents with   Post-op Follow-up   Visit Diagnoses: No diagnosis found.  Plan: [name] Gregory is now a week out right shoulder arthroscopy with rotator interval release and manipulation.  That was done 01/24/2024.  Taking oxycodone  and Robaxin .  Portal incisions intact.  Sutures removed.  He has had 2 physical therapy visits.  Doing home exercise program.  On exam range of motion is 25/85/110.  Oxycodone  refilled.  2-week return for recheck range of motion.  Encouraged him to use the arm is much as possible.  Okay for him to get up to a 7 or 8 level pain in order to stretch the shoulder.  Follow-Up Instructions: Return in about 2 weeks (around 02/14/2024).   Orders:  No orders of the defined types were placed in this encounter.  No orders of the defined types were placed in this encounter.   Imaging: No results found.  PMFS History: Patient Active Problem List   Diagnosis Date Noted   Adhesive capsulitis of right shoulder 01/26/2024   Hx of smoking 10/12/2023   Lymphedema 03/26/2023   Bilateral sensorineural hearing loss 01/04/2023   Chronic respiratory failure with hypoxia (HCC) 01/02/2022   HTN (hypertension) 06/10/2020   Loose stools 06/09/2020   GERD (gastroesophageal reflux disease) 06/09/2020   Elevated LFTs 06/09/2020   Early satiety 06/09/2020   Fatty liver 06/09/2020   Dizziness    Hyperglycemia    Liver enzyme elevation    Nausea vomiting and diarrhea    Transaminitis    AKI (acute kidney injury) (HCC) 07/17/2019   Abdominal pain 07/17/2019   Nasal congestion 07/17/2019   Post-nasal drainage 07/17/2019   OSA on CPAP 06/14/2017   Morbid (severe) obesity due to excess calories (HCC) 06/14/2017   Abdominal pain, epigastric 04/24/2016   RUQ pain  04/24/2016   Microcytic anemia 04/24/2016   Diarrhea 04/24/2016   Elevated sed rate 04/24/2016   Splenomegaly 04/24/2016   Past Medical History:  Diagnosis Date   Alpha thalassaemia minor    Anxiety    Carpal tunnel syndrome    CHF (congestive heart failure) (HCC)    Complication of anesthesia    WOKE UP DURING SURGERY   Diabetes (HCC)    Fatty liver    GERD (gastroesophageal reflux disease)    Hemangioma of liver    Hypertension    NICM (nonischemic cardiomyopathy) (HCC) 11/2021   Rotator cuff disorder    Sleep apnea    URI (upper respiratory infection)     Family History  Problem Relation Age of Onset   Hypertension Mother    CVA Father    Diabetes Father    Throat cancer Father    Heart disease Brother 18   Cervical cancer Maternal Grandmother    Throat cancer Maternal Grandmother    Throat cancer Maternal Grandfather    Bladder Cancer Maternal Grandfather    Cirrhosis Maternal Grandfather    Colon cancer Neg Hx     Past Surgical History:  Procedure Laterality Date   BIOPSY  05/04/2016   Procedure: BIOPSY;  Surgeon: Suzette Espy, MD;  Location: [facility];  Service: Endoscopy;;  gastric bx   CARDIAC CATHETERIZATION Bilateral 10/2021  COLONOSCOPY WITH PROPOFOL  N/A 05/04/2016    PROPOFOL ;  Surgeon: Suzette Espy, MD; diverticulosis throughout the entire colon, otherwise normal.  Repeat due in 2027.   ESOPHAGOGASTRODUODENOSCOPY (EGD) WITH PROPOFOL  N/A 05/04/2016   PROPOFOL ;  Surgeon: Suzette Espy, MD; LA grade B esophagitis, gastritis, small hiatal hernia.   EXAM UNDER ANESTHESIA WITH MANIPULATION OF SHOULDER Right 01/24/2024   Procedure: EXAM UNDER ANESTHESIA, SHOULDER, WITH MANIPULATION;  Surgeon: Jasmine Mesi, MD;  Location: [facility] OR;  Service: Orthopedics;  Laterality: Right;  RIGHT SHOULDER MANIPULATION UNDER ANESTHESIA, ARTHROSCOPY, DEBRIDEMENT, ROTATOR INTERVAL RELEASE   EYE SURGERY     POSTERIOR LUMBAR FUSION 2 WITH HARDWARE REMOVAL Right  01/24/2024   Procedure: ARTHROSCOPY, SHOULDER WITH DEBRIDEMENT AND ROTATOR INTERVALRELEASE;  Surgeon: Jasmine Mesi, MD;  Location: [facility] OR;  Service: Orthopedics;  Laterality: Right;   RIGHT HEART CATH Right 06/28/2022   Procedure: RIGHT HEART CATH;  Surgeon: Mardell Shade, MD;  Location: [facility];  Service: Cardiovascular;  Laterality: Right;   SALIVARY GLAND SURGERY  1992   Social History   Occupational History   Occupation: administrative for [employer]  Tobacco Use   Smoking status: Former    Current packs/day: 0.00    Average packs/day: 0.3 packs/day for 36.0 years (9.0 ttl pk-yrs)    Types: Cigarettes    Start date: 11/18/1985    Quit date: 11/18/2021    Years since quitting: 2.2   Smokeless tobacco: Never   Tobacco comments:    QUIT SMOKING IN APRIL  Vaping Use   Vaping status: Former   Quit date: 09/28/2023  Substance and Sexual Activity   Alcohol use: Not Currently    Comment: maybe once a month   Drug use: No   Sexual activity: Not on file

## 2024-02-01 ENCOUNTER — Ambulatory Visit (HOSPITAL_COMMUNITY): Payer: PRIVATE HEALTH INSURANCE | Admitting: Occupational Therapy

## 2024-02-01 ENCOUNTER — Encounter (HOSPITAL_COMMUNITY): Payer: Self-pay | Admitting: Occupational Therapy

## 2024-02-01 DIAGNOSIS — M25511 Pain in right shoulder: Secondary | ICD-10-CM

## 2024-02-01 DIAGNOSIS — M25611 Stiffness of right shoulder, not elsewhere classified: Secondary | ICD-10-CM

## 2024-02-01 DIAGNOSIS — R29898 Other symptoms and signs involving the musculoskeletal system: Secondary | ICD-10-CM

## 2024-02-01 NOTE — Therapy (Signed)
 OUTPATIENT OCCUPATIONAL THERAPY ORTHO TREATMENT  Patient Name: [name] Gregory MRN: [id number] DOB:11/11/1970, 53 y.o., male Today's Date: 02/01/2024   END OF SESSION:  OT End of Session - 02/01/24 1556     Visit Number 3    Number of Visits 24    Date for OT Re-Evaluation 03/25/24   progress note 02/23/24   Authorization Type Generic commercial    OT Start Time 1513    OT Stop Time 1551    OT Time Calculation (min) 38 min    Activity Tolerance Patient tolerated treatment well    Behavior During Therapy WFL for tasks assessed/performed            Past Medical History:  Diagnosis Date   Alpha thalassaemia minor    Anxiety    Carpal tunnel syndrome    CHF (congestive heart failure) (HCC)    Complication of anesthesia    WOKE UP DURING SURGERY   Diabetes (HCC)    Fatty liver    GERD (gastroesophageal reflux disease)    Hemangioma of liver    Hypertension    NICM (nonischemic cardiomyopathy) (HCC) 11/2021   Rotator cuff disorder    Sleep apnea    URI (upper respiratory infection)    Past Surgical History:  Procedure Laterality Date   BIOPSY  05/04/2016   Procedure: BIOPSY;  Surgeon: Suzette Espy, MD;  Location: [facility];  Service: Endoscopy;;  gastric bx   CARDIAC CATHETERIZATION Bilateral 10/2021   COLONOSCOPY WITH PROPOFOL  N/A 05/04/2016    PROPOFOL ;  Surgeon: Suzette Espy, MD; diverticulosis throughout the entire colon, otherwise normal.  Repeat due in 2027.   ESOPHAGOGASTRODUODENOSCOPY (EGD) WITH PROPOFOL  N/A 05/04/2016   PROPOFOL ;  Surgeon: Suzette Espy, MD; LA grade B esophagitis, gastritis, small hiatal hernia.   EXAM UNDER ANESTHESIA WITH MANIPULATION OF SHOULDER Right 01/24/2024   Procedure: EXAM UNDER ANESTHESIA, SHOULDER, WITH MANIPULATION;  Surgeon: Jasmine Mesi, MD;  Location: [facility] OR;  Service: Orthopedics;  Laterality: Right;  RIGHT SHOULDER MANIPULATION UNDER ANESTHESIA, ARTHROSCOPY, DEBRIDEMENT, ROTATOR INTERVAL RELEASE   EYE SURGERY      POSTERIOR LUMBAR FUSION 2 WITH HARDWARE REMOVAL Right 01/24/2024   Procedure: ARTHROSCOPY, SHOULDER WITH DEBRIDEMENT AND ROTATOR INTERVALRELEASE;  Surgeon: Jasmine Mesi, MD;  Location: [facility] OR;  Service: Orthopedics;  Laterality: Right;   RIGHT HEART CATH Right 06/28/2022   Procedure: RIGHT HEART CATH;  Surgeon: Mardell Shade, MD;  Location: [facility];  Service: Cardiovascular;  Laterality: Right;   SALIVARY GLAND SURGERY  1992   Patient Active Problem List   Diagnosis Date Noted   Adhesive capsulitis of right shoulder 01/26/2024   Hx of smoking 10/12/2023   Lymphedema 03/26/2023   Bilateral sensorineural hearing loss 01/04/2023   Chronic respiratory failure with hypoxia (HCC) 01/02/2022   HTN (hypertension) 06/10/2020   Loose stools 06/09/2020   GERD (gastroesophageal reflux disease) 06/09/2020   Elevated LFTs 06/09/2020   Early satiety 06/09/2020   Fatty liver 06/09/2020   Dizziness    Hyperglycemia    Liver enzyme elevation    Nausea vomiting and diarrhea    Transaminitis    AKI (acute kidney injury) (HCC) 07/17/2019   Abdominal pain 07/17/2019   Nasal congestion 07/17/2019   Post-nasal drainage 07/17/2019   OSA on CPAP 06/14/2017   Morbid (severe) obesity due to excess calories (HCC) 06/14/2017   Abdominal pain, epigastric 04/24/2016   RUQ pain 04/24/2016   Microcytic anemia 04/24/2016   Diarrhea 04/24/2016  Elevated sed rate 04/24/2016   Splenomegaly 04/24/2016    PCP: Dr. Twylla Galen  REFERRING PROVIDER: Dr. Jasmine Mesi  ONSET DATE: 01/24/24  REFERRING DIAG: M25.511 (ICD-10-CM) - Acute pain of right shoulder   THERAPY DIAG:  Acute pain of right shoulder  Stiffness of right shoulder, not elsewhere classified  Other symptoms and signs involving the musculoskeletal system  Rationale for Evaluation and Treatment: Rehabilitation  SUBJECTIVE:   SUBJECTIVE STATEMENT: S: The doctor said I'm coming along.   PERTINENT HISTORY: Pt  is a 53 y/o male s/p right shoulder arthroscopic debridement and rotator interval release as well as manipulation on 01/24/24. 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 Pt presents in standard sling for evaluation.   PRECAUTIONS: Shoulder  WEIGHT BEARING RESTRICTIONS: No  PAIN:  Are you having pain? Yes: NPRS scale: 4/10 Pain location: right shoulder Pain description: aching like a toothache Aggravating factors: moving it out to the side Relieving factors: nothing  FALLS: Has patient fallen in last 6 months? No  PLOF: Independent  PATIENT GOALS: To have less pain and more shoulder mobility  NEXT MD VISIT: 02/18/24  OBJECTIVE:   HAND DOMINANCE: Right  ADLs: Overall ADLs: Pt reports difficulty with reaching overhead, behind back, and across body. Has difficulty with putting belt on, bathing, trying to lift items and try to transfer it. Sleeping is horrible, can only sleep on his back.   FUNCTIONAL OUTCOME MEASURES: Upper Extremity Functional Scale (UEFS): 13/80=16%  UPPER EXTREMITY ROM:       Assessed in supine, er/IR adducted  Passive ROM Right eval  Shoulder flexion 35  Shoulder abduction 55  Shoulder internal rotation 90  Shoulder external rotation -8  (Blank rows = not tested)    UPPER EXTREMITY MMT:     Not assessed at evaluation due to precautions/pain  MMT Right eval  Shoulder flexion   Shoulder abduction   Shoulder internal rotation   Shoulder external rotation   (Blank rows = not tested)  EDEMA: mild secondary to sx 6/12  OBSERVATIONS: mod fascial restrictions in right upper arm, anterior shoulder, and trapezius regions   TODAY'S TREATMENT:                                                                                                                              DATE:  02/01/24 -Manual techniques: retrograde massage and myofascial release to right upper arm, anterior shoulder, trapezius, and scapular regions to decrease edema,  pain, and fascial restrictions and increase joint ROM -P/ROM: supine-flexion, abduction, er, IR, horizontal abduction, 5 reps -AA/ROM: supine-protraction, flexion, er, 10 reps -Scapular A/ROM: seated-row, 10 reps -Table slides: flexion, abduction, er, 10 reps -Thumb tacks: low level, 1'  -Table wash: 1' -Pulleys: 1' flexion -Therapy ball rolls: abduction, 10 reps  01/28/24 -Manual techniques: retrograde massage and myofascial release to right upper arm, anterior shoulder, trapezius, and scapular regions to decrease edema, pain, and fascial restrictions and increase  joint ROM -P/ROM: supine-flexion, abduction, er, IR, 10 reps -Scapular A/ROM: seated-elevation/depression, 10 reps -Pendulums: side to side, forward/back, circles, 1' each  -A/ROM: elbow flexion/extension, forearm supination/pronation, 10 reps  Eval -Manual techniques: retrograde massage and myofascial release to right upper arm, anterior shoulder, trapezius, and scapular regions to decrease edema, pain, and fascial restrictions and increase joint ROM -Pendulums: side to side, forward/back, circles, 1' each     PATIENT EDUCATION: Education details: AA/ROM Person educated: Patient Education method: Programmer, multimedia, Demonstration, and Handouts Education comprehension: verbalized understanding and returned demonstration  HOME EXERCISE PROGRAM: Eval: pendulums, wrist A/ROM 6/20: AA/ROM  GOALS: Goals reviewed with patient? Yes   SHORT TERM GOALS: Target date: 02/24/24  Pt will be provided with and educated on HEP to improve mobility in RUE required for use during ADL completion.   Goal status: IN PROGRESS  2.  Pt will increase RUE P/ROM by 75 degrees to improve ability to use RUE during dressing tasks with minimal compensatory techniques.   Goal status: IN PROGRESS  3.  Pt will increase RUE strength to 3+/5 to improve ability to reach for items at waist to chest height during bathing and grooming tasks.   Goal status:  IN PROGRESS    LONG TERM GOALS: Target date: 03/26/24  Pt will decrease pain in RUE to 3/10 or less to improve ability to sleep for 2+ consecutive hours without waking due to pain.   Goal status: IN PROGRESS  2.  Pt will decrease RUE fascial restrictions to min amounts or less to improve mobility required for functional reaching tasks.   Goal status: IN PROGRESS  3.  Pt will increase RUE A/ROM by 75+ degrees to improve ability to use RUE when reaching overhead or behind back during dressing and bathing tasks.   Goal status: IN PROGRESS  4.  Pt will increase RUE strength to 4+/5 or greater to improve ability to use RUE when lifting or carrying items during meal preparation/housework/yardwork tasks.   Goal status: IN PROGRESS    ASSESSMENT:  CLINICAL IMPRESSION: Pt reports MD is pleased with his progress, MD instructed pt to begin using the arm and holding an abduction stretch for at least 20. Continued with manual techniques and passive stretching. Pt tolerating flexion to approximately 130 degrees, abduction to approximately 90 degrees, min tolerance to er. Added pulleys, table slides, and low level AA/ROM. Pt unable to tolerate abduction pulleys therefore discontinued. Verbal cuing for form and technique, HEP updated for table slides.     PERFORMANCE DEFICITS: in functional skills including in functional skills including ADLs, IADLs, coordination, tone, ROM, strength, pain, fascial restrictions, muscle spasms, and UE functional use      PLAN:  OT FREQUENCY: 3x/week  OT DURATION: 8 weeks  PLANNED INTERVENTIONS: 97168 OT Re-evaluation, 97535 self care/ADL training, 16109 therapeutic exercise, 97530 therapeutic activity, 97112 neuromuscular re-education, 97140 manual therapy, 97035 ultrasound, 97014 electrical stimulation unattended, patient/family education, and DME and/or AE instructions  CONSULTED AND AGREED WITH PLAN OF CARE: Patient  PLAN FOR NEXT SESSION: Follow up  on HEP, manual techniques, passive stretching, scapular mobility, progressing to AA/ROM as tolerated   Lafonda Piety, OTR/L  [phone] 02/01/2024, 3:56 PM

## 2024-02-01 NOTE — Patient Instructions (Signed)
 1) SHOULDER: Flexion On Table   Place hands on towel placed on table, elbows straight. Lean forward with you upper body, pushing towel away from body.  10 reps per set, 3-5 sets per day  2) Abduction (Passive)   With arm out to side, resting on towel placed on table with palm DOWN, keeping trunk away from table, lean to the side while pushing towel away from body.  Repeat 10-15 times. Do 3-5 sessions per day.  Copyright  [employer]. All rights reserved.     3) Internal Rotation (Assistive)   Seated with elbow bent at right angle and held against side, slide arm on table surface in an inward arc keeping elbow anchored in place. Repeat 10-15 times. Do 3-5 sessions per day. Activity: Use this motion to brush crumbs off the table.  Copyright  V[employer]. All rights reserved.

## 2024-02-04 ENCOUNTER — Encounter (HOSPITAL_COMMUNITY): Payer: Self-pay | Admitting: Occupational Therapy

## 2024-02-04 ENCOUNTER — Ambulatory Visit (HOSPITAL_COMMUNITY): Payer: PRIVATE HEALTH INSURANCE | Admitting: Occupational Therapy

## 2024-02-04 DIAGNOSIS — M25511 Pain in right shoulder: Secondary | ICD-10-CM | POA: Diagnosis not present

## 2024-02-04 DIAGNOSIS — M25611 Stiffness of right shoulder, not elsewhere classified: Secondary | ICD-10-CM

## 2024-02-04 DIAGNOSIS — R29898 Other symptoms and signs involving the musculoskeletal system: Secondary | ICD-10-CM

## 2024-02-04 NOTE — Therapy (Signed)
 OUTPATIENT OCCUPATIONAL THERAPY ORTHO TREATMENT  Patient Name: [name] Gregory MRN: [id number] DOB:[birth year], 53 y.o., male Today's Date: 02/04/2024   END OF SESSION:  OT End of Session - 02/04/24 1116     Visit Number 4    Number of Visits 24    Date for OT Re-Evaluation 03/25/24   progress note 02/23/24   Authorization Type Generic commercial    OT Start Time 1049    OT Stop Time 1115    OT Time Calculation (min) 26 min    Activity Tolerance Patient tolerated treatment well    Behavior During Therapy WFL for tasks assessed/performed          **pt arrived 17 mins late**   Past Medical History:  Diagnosis Date   Alpha thalassaemia minor    Anxiety    Carpal tunnel syndrome    CHF (congestive heart failure) (HCC)    Complication of anesthesia    WOKE UP DURING SURGERY   Diabetes (HCC)    Fatty liver    GERD (gastroesophageal reflux disease)    Hemangioma of liver    Hypertension    NICM (nonischemic cardiomyopathy) (HCC) 11/2021   Rotator cuff disorder    Sleep apnea    URI (upper respiratory infection)    Past Surgical History:  Procedure Laterality Date   BIOPSY  05/04/2016   Procedure: BIOPSY;  Surgeon: Lamar CHRISTELLA Hollingshead, MD;  Location: [facility];  Service: Endoscopy;;  gastric bx   CARDIAC CATHETERIZATION Bilateral 10/2021   COLONOSCOPY WITH PROPOFOL  N/A 05/04/2016    PROPOFOL ;  Surgeon: Lamar CHRISTELLA Hollingshead, MD; diverticulosis throughout the entire colon, otherwise normal.  Repeat due in 2027.   ESOPHAGOGASTRODUODENOSCOPY (EGD) WITH PROPOFOL  N/A 05/04/2016   PROPOFOL ;  Surgeon: Lamar CHRISTELLA Hollingshead, MD; LA grade B esophagitis, gastritis, small hiatal hernia.   EXAM UNDER ANESTHESIA WITH MANIPULATION OF SHOULDER Right 01/24/2024   Procedure: EXAM UNDER ANESTHESIA, SHOULDER, WITH MANIPULATION;  Surgeon: Addie Cordella Hamilton, MD;  Location: [facility] OR;  Service: Orthopedics;  Laterality: Right;  RIGHT SHOULDER MANIPULATION UNDER ANESTHESIA, ARTHROSCOPY, DEBRIDEMENT, ROTATOR  INTERVAL RELEASE   EYE SURGERY     POSTERIOR LUMBAR FUSION 2 WITH HARDWARE REMOVAL Right 01/24/2024   Procedure: ARTHROSCOPY, SHOULDER WITH DEBRIDEMENT AND ROTATOR INTERVALRELEASE;  Surgeon: Addie Cordella Hamilton, MD;  Location: [facility] OR;  Service: Orthopedics;  Laterality: Right;   RIGHT HEART CATH Right 06/28/2022   Procedure: RIGHT HEART CATH;  Surgeon: Cherrie Toribio SAUNDERS, MD;  Location: [facility];  Service: Cardiovascular;  Laterality: Right;   SALIVARY GLAND SURGERY  1992   Patient Active Problem List   Diagnosis Date Noted   Adhesive capsulitis of right shoulder 01/26/2024   Hx of smoking 10/12/2023   Lymphedema 03/26/2023   Bilateral sensorineural hearing loss 01/04/2023   Chronic respiratory failure with hypoxia (HCC) 01/02/2022   HTN (hypertension) 06/10/2020   Loose stools 06/09/2020   GERD (gastroesophageal reflux disease) 06/09/2020   Elevated LFTs 06/09/2020   Early satiety 06/09/2020   Fatty liver 06/09/2020   Dizziness    Hyperglycemia    Liver enzyme elevation    Nausea vomiting and diarrhea    Transaminitis    AKI (acute kidney injury) (HCC) 07/17/2019   Abdominal pain 07/17/2019   Nasal congestion 07/17/2019   Post-nasal drainage 07/17/2019   OSA on CPAP 06/14/2017   Morbid (severe) obesity due to excess calories (HCC) 06/14/2017   Abdominal pain, epigastric 04/24/2016   RUQ pain 04/24/2016   Microcytic anemia 04/24/2016  Diarrhea 04/24/2016   Elevated sed rate 04/24/2016   Splenomegaly 04/24/2016    PCP: Dr. Karna Lukes  REFERRING PROVIDER: Dr. Cordella Glendia Hutchinson  ONSET DATE: 01/24/24  REFERRING DIAG: M25.511 (ICD-10-CM) - Acute pain of right shoulder   THERAPY DIAG:  Acute pain of right shoulder  Stiffness of right shoulder, not elsewhere classified  Other symptoms and signs involving the musculoskeletal system  Rationale for Evaluation and Treatment: Rehabilitation  SUBJECTIVE:   SUBJECTIVE STATEMENT: S: The doctor said I'm  coming along.   PERTINENT HISTORY: Pt is a 53 y/o male s/p right shoulder arthroscopic debridement and rotator interval release as well as manipulation on 01/24/24. 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 Pt presents in standard sling for evaluation.   PRECAUTIONS: Shoulder  WEIGHT BEARING RESTRICTIONS: No  PAIN:  Are you having pain? Yes: NPRS scale: 4/10 Pain location: right shoulder Pain description: aching like a toothache Aggravating factors: moving it out to the side Relieving factors: nothing  FALLS: Has patient fallen in last 6 months? No  PLOF: Independent  PATIENT GOALS: To have less pain and more shoulder mobility  NEXT MD VISIT: 02/18/24  OBJECTIVE:   HAND DOMINANCE: Right  ADLs: Overall ADLs: Pt reports difficulty with reaching overhead, behind back, and across body. Has difficulty with putting belt on, bathing, trying to lift items and try to transfer it. Sleeping is horrible, can only sleep on his back.   FUNCTIONAL OUTCOME MEASURES: Upper Extremity Functional Scale (UEFS): 13/80=16%  UPPER EXTREMITY ROM:       Assessed in supine, er/IR adducted  Passive ROM Right eval  Shoulder flexion 35  Shoulder abduction 55  Shoulder internal rotation 90  Shoulder external rotation -8  (Blank rows = not tested)    UPPER EXTREMITY MMT:     Not assessed at evaluation due to precautions/pain  MMT Right eval  Shoulder flexion   Shoulder abduction   Shoulder internal rotation   Shoulder external rotation   (Blank rows = not tested)  EDEMA: mild secondary to sx 6/12  OBSERVATIONS: mod fascial restrictions in right upper arm, anterior shoulder, and trapezius regions   TODAY'S TREATMENT:                                                                                                                              DATE:  02/04/24 -P/ROM: supine, flexion, abduction, er/IR, x10 -AA/ROM: supine, flexion, abduction, protraction, er/IR,  x10 -Scapular ROM: elevation/depression, rows, x10 -Pulleys: flexion, abduction, x1' each -Wall Climbs: flexion, x10 -Wall Wash: flexion, 1'  02/01/24 -Manual techniques: retrograde massage and myofascial release to right upper arm, anterior shoulder, trapezius, and scapular regions to decrease edema, pain, and fascial restrictions and increase joint ROM -P/ROM: supine-flexion, abduction, er, IR, horizontal abduction, 5 reps -AA/ROM: supine-protraction, flexion, er, 10 reps -Scapular A/ROM: seated-row, 10 reps -Table slides: flexion, abduction, er, 10 reps -Thumb tacks: low level, 1'  -Table wash: 1' -  Pulleys: 1' flexion -Therapy ball rolls: abduction, 10 reps  01/28/24 -Manual techniques: retrograde massage and myofascial release to right upper arm, anterior shoulder, trapezius, and scapular regions to decrease edema, pain, and fascial restrictions and increase joint ROM -P/ROM: supine-flexion, abduction, er, IR, 10 reps -Scapular A/ROM: seated-elevation/depression, 10 reps -Pendulums: side to side, forward/back, circles, 1' each  -A/ROM: elbow flexion/extension, forearm supination/pronation, 10 reps   PATIENT EDUCATION: Education details: AA/ROM Person educated: Patient Education method: Programmer, multimedia, Facilities manager, and Handouts Education comprehension: verbalized understanding and returned demonstration  HOME EXERCISE PROGRAM: Eval: pendulums, wrist A/ROM 6/20: AA/ROM  GOALS: Goals reviewed with patient? Yes   SHORT TERM GOALS: Target date: 02/24/24  Pt will be provided with and educated on HEP to improve mobility in RUE required for use during ADL completion.   Goal status: IN PROGRESS  2.  Pt will increase RUE P/ROM by 75 degrees to improve ability to use RUE during dressing tasks with minimal compensatory techniques.   Goal status: IN PROGRESS  3.  Pt will increase RUE strength to 3+/5 to improve ability to reach for items at waist to chest height during bathing and  grooming tasks.   Goal status: IN PROGRESS    LONG TERM GOALS: Target date: 03/26/24  Pt will decrease pain in RUE to 3/10 or less to improve ability to sleep for 2+ consecutive hours without waking due to pain.   Goal status: IN PROGRESS  2.  Pt will decrease RUE fascial restrictions to min amounts or less to improve mobility required for functional reaching tasks.   Goal status: IN PROGRESS  3.  Pt will increase RUE A/ROM by 75+ degrees to improve ability to use RUE when reaching overhead or behind back during dressing and bathing tasks.   Goal status: IN PROGRESS  4.  Pt will increase RUE strength to 4+/5 or greater to improve ability to use RUE when lifting or carrying items during meal preparation/housework/yardwork tasks.   Goal status: IN PROGRESS    ASSESSMENT:  CLINICAL IMPRESSION: This session pt continuing to tolerate P/ROM well, as well as AA/ROM. He reports that HEP is going well and overall no increase in pain. Sleep continues to be limited and difficult. He is able to achieve over 60% of full ROM with both passive and assisted ROM. Pt overall feels like he is improving well and has decreased pain and discomfort with improving use.    PERFORMANCE DEFICITS: in functional skills including in functional skills including ADLs, IADLs, coordination, tone, ROM, strength, pain, fascial restrictions, muscle spasms, and UE functional use      PLAN:  OT FREQUENCY: 3x/week  OT DURATION: 8 weeks  PLANNED INTERVENTIONS: 97168 OT Re-evaluation, 97535 self care/ADL training, 02889 therapeutic exercise, 97530 therapeutic activity, 97112 neuromuscular re-education, 97140 manual therapy, 97035 ultrasound, 97014 electrical stimulation unattended, patient/family education, and DME and/or AE instructions  CONSULTED AND AGREED WITH PLAN OF CARE: Patient  PLAN FOR NEXT SESSION: Follow up on HEP, manual techniques, passive stretching, scapular mobility, progressing to AA/ROM as  tolerated   Valentin Nightingale, OTR/L [phone] 02/04/2024, 11:17 AM

## 2024-02-06 ENCOUNTER — Ambulatory Visit (HOSPITAL_COMMUNITY): Payer: PRIVATE HEALTH INSURANCE | Admitting: Occupational Therapy

## 2024-02-06 ENCOUNTER — Encounter (HOSPITAL_COMMUNITY): Payer: Self-pay | Admitting: Occupational Therapy

## 2024-02-06 DIAGNOSIS — M25611 Stiffness of right shoulder, not elsewhere classified: Secondary | ICD-10-CM

## 2024-02-06 DIAGNOSIS — M25511 Pain in right shoulder: Secondary | ICD-10-CM

## 2024-02-06 DIAGNOSIS — R29898 Other symptoms and signs involving the musculoskeletal system: Secondary | ICD-10-CM

## 2024-02-06 NOTE — Therapy (Signed)
 OUTPATIENT OCCUPATIONAL THERAPY ORTHO TREATMENT  Patient Name: [name] Gregory MRN: [id number] DOB:Oct 11, 1970, 53 y.o., male Today's Date: 02/06/2024   END OF SESSION:  OT End of Session - 02/06/24 1229     Visit Number 5    Number of Visits 24    Date for OT Re-Evaluation 03/25/24   progress note 02/23/24   Authorization Type Generic commercial    OT Start Time 1145    OT Stop Time 1224    OT Time Calculation (min) 39 min    Activity Tolerance Patient tolerated treatment well    Behavior During Therapy WFL for tasks assessed/performed              Past Medical History:  Diagnosis Date   Alpha thalassaemia minor    Anxiety    Carpal tunnel syndrome    CHF (congestive heart failure) (HCC)    Complication of anesthesia    WOKE UP DURING SURGERY   Diabetes (HCC)    Fatty liver    GERD (gastroesophageal reflux disease)    Hemangioma of liver    Hypertension    NICM (nonischemic cardiomyopathy) (HCC) 11/2021   Rotator cuff disorder    Sleep apnea    URI (upper respiratory infection)    Past Surgical History:  Procedure Laterality Date   BIOPSY  05/04/2016   Procedure: BIOPSY;  Surgeon: Lamar CHRISTELLA Hollingshead, MD;  Location: [facility];  Service: Endoscopy;;  gastric bx   CARDIAC CATHETERIZATION Bilateral 10/2021   COLONOSCOPY WITH PROPOFOL  N/A 05/04/2016    PROPOFOL ;  Surgeon: Lamar CHRISTELLA Hollingshead, MD; diverticulosis throughout the entire colon, otherwise normal.  Repeat due in 2027.   ESOPHAGOGASTRODUODENOSCOPY (EGD) WITH PROPOFOL  N/A 05/04/2016   PROPOFOL ;  Surgeon: Lamar CHRISTELLA Hollingshead, MD; LA grade B esophagitis, gastritis, small hiatal hernia.   EXAM UNDER ANESTHESIA WITH MANIPULATION OF SHOULDER Right 01/24/2024   Procedure: EXAM UNDER ANESTHESIA, SHOULDER, WITH MANIPULATION;  Surgeon: Addie Cordella Hamilton, MD;  Location: [facility] OR;  Service: Orthopedics;  Laterality: Right;  RIGHT SHOULDER MANIPULATION UNDER ANESTHESIA, ARTHROSCOPY, DEBRIDEMENT, ROTATOR INTERVAL RELEASE   EYE SURGERY      POSTERIOR LUMBAR FUSION 2 WITH HARDWARE REMOVAL Right 01/24/2024   Procedure: ARTHROSCOPY, SHOULDER WITH DEBRIDEMENT AND ROTATOR INTERVALRELEASE;  Surgeon: Addie Cordella Hamilton, MD;  Location: [facility] OR;  Service: Orthopedics;  Laterality: Right;   RIGHT HEART CATH Right 06/28/2022   Procedure: RIGHT HEART CATH;  Surgeon: Cherrie Toribio SAUNDERS, MD;  Location: [facility];  Service: Cardiovascular;  Laterality: Right;   SALIVARY GLAND SURGERY  1992   Patient Active Problem List   Diagnosis Date Noted   Adhesive capsulitis of right shoulder 01/26/2024   Hx of smoking 10/12/2023   Lymphedema 03/26/2023   Bilateral sensorineural hearing loss 01/04/2023   Chronic respiratory failure with hypoxia (HCC) 01/02/2022   HTN (hypertension) 06/10/2020   Loose stools 06/09/2020   GERD (gastroesophageal reflux disease) 06/09/2020   Elevated LFTs 06/09/2020   Early satiety 06/09/2020   Fatty liver 06/09/2020   Dizziness    Hyperglycemia    Liver enzyme elevation    Nausea vomiting and diarrhea    Transaminitis    AKI (acute kidney injury) (HCC) 07/17/2019   Abdominal pain 07/17/2019   Nasal congestion 07/17/2019   Post-nasal drainage 07/17/2019   OSA on CPAP 06/14/2017   Morbid (severe) obesity due to excess calories (HCC) 06/14/2017   Abdominal pain, epigastric 04/24/2016   RUQ pain 04/24/2016   Microcytic anemia 04/24/2016   Diarrhea  04/24/2016   Elevated sed rate 04/24/2016   Splenomegaly 04/24/2016    PCP: Dr. Karna Lukes  REFERRING PROVIDER: Dr. Cordella Glendia Hutchinson  ONSET DATE: 01/24/24  REFERRING DIAG: M25.511 (ICD-10-CM) - Acute pain of right shoulder   THERAPY DIAG:  Acute pain of right shoulder  Stiffness of right shoulder, not elsewhere classified  Other symptoms and signs involving the musculoskeletal system  Rationale for Evaluation and Treatment: Rehabilitation  SUBJECTIVE:   SUBJECTIVE STATEMENT: S: it's a little achy today  PERTINENT HISTORY: Pt is a  53 y/o male s/p right shoulder arthroscopic debridement and rotator interval release as well as manipulation on 01/24/24. 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 Pt presents in standard sling for evaluation.   PRECAUTIONS: Shoulder  WEIGHT BEARING RESTRICTIONS: No  PAIN:  Are you having pain? Yes: NPRS scale: 6/10 Pain location: right shoulder Pain description: aching like a toothache Aggravating factors: moving it out to the side Relieving factors: nothing  FALLS: Has patient fallen in last 6 months? No  PLOF: Independent  PATIENT GOALS: To have less pain and more shoulder mobility  NEXT MD VISIT: 02/18/24  OBJECTIVE:   HAND DOMINANCE: Right  ADLs: Overall ADLs: Pt reports difficulty with reaching overhead, behind back, and across body. Has difficulty with putting belt on, bathing, trying to lift items and try to transfer it. Sleeping is horrible, can only sleep on his back.   FUNCTIONAL OUTCOME MEASURES: Upper Extremity Functional Scale (UEFS): 13/80=16%  UPPER EXTREMITY ROM:       Assessed in supine, er/IR adducted  Passive ROM Right eval  Shoulder flexion 35  Shoulder abduction 55  Shoulder internal rotation 90  Shoulder external rotation -8  (Blank rows = not tested)    UPPER EXTREMITY MMT:     Not assessed at evaluation due to precautions/pain  MMT Right eval  Shoulder flexion   Shoulder abduction   Shoulder internal rotation   Shoulder external rotation   (Blank rows = not tested)  EDEMA: mild secondary to sx 6/12  OBSERVATIONS: mod fascial restrictions in right upper arm, anterior shoulder, and trapezius regions   TODAY'S TREATMENT:                                                                                                                              DATE:  02/06/24 -Manual techniques: retrograde massage and myofascial release to right upper arm, anterior shoulder, trapezius, and scapular regions to decrease edema, pain,  and fascial restrictions and increase joint ROM -P/ROM: supine-flexion, abduction, er, IR, horizontal abduction, 5 reps -A/ROM: RUE only- supine-protraction, flexion, er, horizontal abduction, abduction, 10 reps -Shoulder stretches: flexion, doorway stretch, 2x10 holds -A/ROM: RUE only, sitting-protraction, flexion, er, horizontal abduction, abduction, 10 reps -Wall wash: 1'  -Thumb tacks: shoulder height, 1'  02/04/24 -P/ROM: supine, flexion, abduction, er/IR, x10 -AA/ROM: supine, flexion, abduction, protraction, er/IR, x10 -Scapular ROM: elevation/depression, rows, x10 -Pulleys: flexion, abduction, x1' each -  Wall Climbs: flexion, x10 -Wall Wash: flexion, 1'  02/01/24 -Manual techniques: retrograde massage and myofascial release to right upper arm, anterior shoulder, trapezius, and scapular regions to decrease edema, pain, and fascial restrictions and increase joint ROM -P/ROM: supine-flexion, abduction, er, IR, horizontal abduction, 5 reps -AA/ROM: supine-protraction, flexion, er, 10 reps -Scapular A/ROM: seated-row, 10 reps -Table slides: flexion, abduction, er, 10 reps -Thumb tacks: low level, 1'  -Table wash: 1' -Pulleys: 1' flexion -Therapy ball rolls: abduction, 10 reps     PATIENT EDUCATION: Education details: A/ROM Person educated: Patient Education method: Programmer, multimedia, Demonstration, and Handouts Education comprehension: verbalized understanding and returned demonstration  HOME EXERCISE PROGRAM: Eval: pendulums, wrist A/ROM 6/20: AA/ROM 6/25: A/ROM  GOALS: Goals reviewed with patient? Yes   SHORT TERM GOALS: Target date: 02/24/24  Pt will be provided with and educated on HEP to improve mobility in RUE required for use during ADL completion.   Goal status: IN PROGRESS  2.  Pt will increase RUE P/ROM by 75 degrees to improve ability to use RUE during dressing tasks with minimal compensatory techniques.   Goal status: IN PROGRESS  3.  Pt will increase RUE  strength to 3+/5 to improve ability to reach for items at waist to chest height during bathing and grooming tasks.   Goal status: IN PROGRESS    LONG TERM GOALS: Target date: 03/26/24  Pt will decrease pain in RUE to 3/10 or less to improve ability to sleep for 2+ consecutive hours without waking due to pain.   Goal status: IN PROGRESS  2.  Pt will decrease RUE fascial restrictions to min amounts or less to improve mobility required for functional reaching tasks.   Goal status: IN PROGRESS  3.  Pt will increase RUE A/ROM by 75+ degrees to improve ability to use RUE when reaching overhead or behind back during dressing and bathing tasks.   Goal status: IN PROGRESS  4.  Pt will increase RUE strength to 4+/5 or greater to improve ability to use RUE when lifting or carrying items during meal preparation/housework/yardwork tasks.   Goal status: IN PROGRESS    ASSESSMENT:  CLINICAL IMPRESSION: Pt reports HEP is going well, LUE is hurting more than the RUE right now. Pt completing A/ROM today as AA/ROM was too painful for the LUE today. Pt demonstrating ROM to approximately 65% in supine. Added A/ROM in sitting, pt with less pain and improved ROM in sitting compared to supine, demonstrating ~75% ROM. Verbal cuing for form and technique.    PERFORMANCE DEFICITS: in functional skills including in functional skills including ADLs, IADLs, coordination, tone, ROM, strength, pain, fascial restrictions, muscle spasms, and UE functional use      PLAN:  OT FREQUENCY: 3x/week  OT DURATION: 8 weeks  PLANNED INTERVENTIONS: 97168 OT Re-evaluation, 97535 self care/ADL training, 02889 therapeutic exercise, 97530 therapeutic activity, 97112 neuromuscular re-education, 97140 manual therapy, 97035 ultrasound, 97014 electrical stimulation unattended, patient/family education, and DME and/or AE instructions  CONSULTED AND AGREED WITH PLAN OF CARE: Patient  PLAN FOR NEXT SESSION: Follow up on HEP,  manual techniques, passive stretching, scapular mobility, progressing to A/ROM as tolerated   Sonny Cory, OTR/L  [phone] 02/06/2024, 12:29 PM

## 2024-02-06 NOTE — Patient Instructions (Signed)
 Repeat all exercises 10-15 times, 1-2 times per day.  1) Shoulder Protraction    Begin with elbows by your side, slowly "punch" straight out in front of you.      2) Shoulder Flexion Standing:         Begin with arms at your side with thumbs pointed up, slowly raise both arms up and forward towards overhead.               3) Horizontal abduction/adduction  Standing:           Begin with arms straight out in front of you, bring out to the side in at "T" shape. Keep arms straight entire time.                 4) Internal & External Rotation  Standing:     Stand with elbows at the side and elbows bent 90 degrees. Move your forearms away from your body, then bring back inward toward the body.     5) Shoulder Abduction  Standing:       Lying on your back begin with your arms flat on the table next to your side. Slowly move your arms out to the side so that they go overhead, in a jumping jack or snow angel movement.

## 2024-02-08 ENCOUNTER — Encounter (HOSPITAL_COMMUNITY): Payer: PRIVATE HEALTH INSURANCE | Admitting: Occupational Therapy

## 2024-02-08 ENCOUNTER — Ambulatory Visit: Payer: PRIVATE HEALTH INSURANCE | Admitting: Nurse Practitioner

## 2024-02-11 ENCOUNTER — Encounter (HOSPITAL_COMMUNITY): Payer: PRIVATE HEALTH INSURANCE | Admitting: Occupational Therapy

## 2024-02-12 NOTE — Telephone Encounter (Signed)
 At the time of your last office visit, [name] Gregory stopped the Glipizide  for now.

## 2024-02-13 ENCOUNTER — Encounter (HOSPITAL_COMMUNITY): Payer: Self-pay | Admitting: Occupational Therapy

## 2024-02-13 ENCOUNTER — Ambulatory Visit (HOSPITAL_COMMUNITY): Payer: PRIVATE HEALTH INSURANCE | Attending: Orthopedic Surgery | Admitting: Occupational Therapy

## 2024-02-13 DIAGNOSIS — M25511 Pain in right shoulder: Secondary | ICD-10-CM | POA: Diagnosis present

## 2024-02-13 DIAGNOSIS — M25611 Stiffness of right shoulder, not elsewhere classified: Secondary | ICD-10-CM | POA: Diagnosis present

## 2024-02-13 DIAGNOSIS — R29898 Other symptoms and signs involving the musculoskeletal system: Secondary | ICD-10-CM | POA: Diagnosis present

## 2024-02-13 NOTE — Therapy (Signed)
 OUTPATIENT OCCUPATIONAL THERAPY ORTHO TREATMENT  Patient Name: [name] Gregory MRN: [id number] DOB:03-Sep-1970, 53 y.o., male Today's Date: 02/13/2024   END OF SESSION:  OT End of Session - 02/13/24 1250     Visit Number 6    Number of Visits 24    Date for OT Re-Evaluation 03/25/24   progress note 02/23/24   Authorization Type Generic commercial    OT Start Time 1126    OT Stop Time 1206    OT Time Calculation (min) 40 min    Activity Tolerance Patient tolerated treatment well    Behavior During Therapy WFL for tasks assessed/performed           Past Medical History:  Diagnosis Date   Alpha thalassaemia minor    Anxiety    Carpal tunnel syndrome    CHF (congestive heart failure) (HCC)    Complication of anesthesia    WOKE UP DURING SURGERY   Diabetes (HCC)    Fatty liver    GERD (gastroesophageal reflux disease)    Hemangioma of liver    Hypertension    NICM (nonischemic cardiomyopathy) (HCC) 11/2021   Rotator cuff disorder    Sleep apnea    URI (upper respiratory infection)    Past Surgical History:  Procedure Laterality Date   BIOPSY  05/04/2016   Procedure: BIOPSY;  Surgeon: Lamar CHRISTELLA Hollingshead, MD;  Location: [facility];  Service: Endoscopy;;  gastric bx   CARDIAC CATHETERIZATION Bilateral 10/2021   COLONOSCOPY WITH PROPOFOL  N/A 05/04/2016    PROPOFOL ;  Surgeon: Lamar CHRISTELLA Hollingshead, MD; diverticulosis throughout the entire colon, otherwise normal.  Repeat due in 2027.   ESOPHAGOGASTRODUODENOSCOPY (EGD) WITH PROPOFOL  N/A 05/04/2016   PROPOFOL ;  Surgeon: Lamar CHRISTELLA Hollingshead, MD; LA grade B esophagitis, gastritis, small hiatal hernia.   EXAM UNDER ANESTHESIA WITH MANIPULATION OF SHOULDER Right 01/24/2024   Procedure: EXAM UNDER ANESTHESIA, SHOULDER, WITH MANIPULATION;  Surgeon: Addie Cordella Hamilton, MD;  Location: [facility] OR;  Service: Orthopedics;  Laterality: Right;  RIGHT SHOULDER MANIPULATION UNDER ANESTHESIA, ARTHROSCOPY, DEBRIDEMENT, ROTATOR INTERVAL RELEASE   EYE SURGERY      POSTERIOR LUMBAR FUSION 2 WITH HARDWARE REMOVAL Right 01/24/2024   Procedure: ARTHROSCOPY, SHOULDER WITH DEBRIDEMENT AND ROTATOR INTERVALRELEASE;  Surgeon: Addie Cordella Hamilton, MD;  Location: [facility] OR;  Service: Orthopedics;  Laterality: Right;   RIGHT HEART CATH Right 06/28/2022   Procedure: RIGHT HEART CATH;  Surgeon: Cherrie Toribio SAUNDERS, MD;  Location: [facility] INV[facility];  Service: Cardiovascular;  Laterality: Right;   SALIVARY GLAND SURGERY  1992   Patient Active Problem List   Diagnosis Date Noted   Adhesive capsulitis of right shoulder 01/26/2024   Hx of smoking 10/12/2023   Lymphedema 03/26/2023   Bilateral sensorineural hearing loss 01/04/2023   Chronic respiratory failure with hypoxia (HCC) 01/02/2022   HTN (hypertension) 06/10/2020   Loose stools 06/09/2020   GERD (gastroesophageal reflux disease) 06/09/2020   Elevated LFTs 06/09/2020   Early satiety 06/09/2020   Fatty liver 06/09/2020   Dizziness    Hyperglycemia    Liver enzyme elevation    Nausea vomiting and diarrhea    Transaminitis    AKI (acute kidney injury) (HCC) 07/17/2019   Abdominal pain 07/17/2019   Nasal congestion 07/17/2019   Post-nasal drainage 07/17/2019   OSA on CPAP 06/14/2017   Morbid (severe) obesity due to excess calories (HCC) 06/14/2017   Abdominal pain, epigastric 04/24/2016   RUQ pain 04/24/2016   Microcytic anemia 04/24/2016   Diarrhea 04/24/2016  Elevated sed rate 04/24/2016   Splenomegaly 04/24/2016    PCP: Dr. Karna Lukes  REFERRING PROVIDER: Dr. Cordella Glendia Hutchinson  ONSET DATE: 01/24/24  REFERRING DIAG: M25.511 (ICD-10-CM) - Acute pain of right shoulder   THERAPY DIAG:  Acute pain of right shoulder  Stiffness of right shoulder, not elsewhere classified  Other symptoms and signs involving the musculoskeletal system  Rationale for Evaluation and Treatment: Rehabilitation  SUBJECTIVE:   SUBJECTIVE STATEMENT: S: My arm starts burning a little more with  exercises  PERTINENT HISTORY: Pt is a 53 y/o male s/p right shoulder arthroscopic debridement and rotator interval release as well as manipulation on 01/24/24. 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 Pt presents in standard sling for evaluation.   PRECAUTIONS: Shoulder  WEIGHT BEARING RESTRICTIONS: No  PAIN:  Are you having pain? Yes: NPRS scale: 6/10 Pain location: right shoulder Pain description: aching like a toothache Aggravating factors: moving it out to the side Relieving factors: nothing  FALLS: Has patient fallen in last 6 months? No  PLOF: Independent  PATIENT GOALS: To have less pain and more shoulder mobility  NEXT MD VISIT: 02/18/24  OBJECTIVE:   HAND DOMINANCE: Right  ADLs: Overall ADLs: Pt reports difficulty with reaching overhead, behind back, and across body. Has difficulty with putting belt on, bathing, trying to lift items and try to transfer it. Sleeping is horrible, can only sleep on his back.   FUNCTIONAL OUTCOME MEASURES: Upper Extremity Functional Scale (UEFS): 13/80=16%  UPPER EXTREMITY ROM:       Assessed in supine, er/IR adducted  Passive ROM Right eval  Shoulder flexion 35  Shoulder abduction 55  Shoulder internal rotation 90  Shoulder external rotation -8  (Blank rows = not tested)    UPPER EXTREMITY MMT:     Not assessed at evaluation due to precautions/pain  MMT Right eval  Shoulder flexion   Shoulder abduction   Shoulder internal rotation   Shoulder external rotation   (Blank rows = not tested)  EDEMA: mild secondary to sx 6/12  OBSERVATIONS: mod fascial restrictions in right upper arm, anterior shoulder, and trapezius regions   TODAY'S TREATMENT:                                                                                                                              DATE:  02/13/24 -Pulleys: flexion, abduction, x60 -A/ROM: supine, 2#, flexion, abduction, protraction, horizontal abduction, er/IR,  x10 -Proximal shoulder exercises: supine, 1#, paddles, criss cross, circles both directions, x10 -ABC's in the air -A/ROM: seated, flexion, abduction, protraction, horizontal abduction, er/IR, x10 -stretches: flexion, er doorway, corner stretch, 2x20  02/06/24 -Manual techniques: retrograde massage and myofascial release to right upper arm, anterior shoulder, trapezius, and scapular regions to decrease edema, pain, and fascial restrictions and increase joint ROM -P/ROM: supine-flexion, abduction, er, IR, horizontal abduction, 5 reps -A/ROM: RUE only- supine-protraction, flexion, er, horizontal abduction, abduction, 10 reps -Shoulder stretches: flexion,  doorway stretch, 2x10 holds -A/ROM: RUE only, sitting-protraction, flexion, er, horizontal abduction, abduction, 10 reps -Wall wash: 1'  -Thumb tacks: shoulder height, 1'  02/04/24 -P/ROM: supine, flexion, abduction, er/IR, x10 -AA/ROM: supine, flexion, abduction, protraction, er/IR, x10 -Scapular ROM: elevation/depression, rows, x10 -Pulleys: flexion, abduction, x1' each -Wall Climbs: flexion, x10 -Wall Wash: flexion, 1'    PATIENT EDUCATION: Education details: Museum/gallery conservator Person educated: Patient Education method: Explanation, Demonstration, and Handouts Education comprehension: verbalized understanding and returned demonstration  HOME EXERCISE PROGRAM: Eval: pendulums, wrist A/ROM 6/20: AA/ROM 6/25: A/ROM 7/2 Shoulder stretches  GOALS: Goals reviewed with patient? Yes   SHORT TERM GOALS: Target date: 02/24/24  Pt will be provided with and educated on HEP to improve mobility in RUE required for use during ADL completion.   Goal status: IN PROGRESS  2.  Pt will increase RUE P/ROM by 75 degrees to improve ability to use RUE during dressing tasks with minimal compensatory techniques.   Goal status: IN PROGRESS  3.  Pt will increase RUE strength to 3+/5 to improve ability to reach for items at waist to chest  height during bathing and grooming tasks.   Goal status: IN PROGRESS    LONG TERM GOALS: Target date: 03/26/24  Pt will decrease pain in RUE to 3/10 or less to improve ability to sleep for 2+ consecutive hours without waking due to pain.   Goal status: IN PROGRESS  2.  Pt will decrease RUE fascial restrictions to min amounts or less to improve mobility required for functional reaching tasks.   Goal status: IN PROGRESS  3.  Pt will increase RUE A/ROM by 75+ degrees to improve ability to use RUE when reaching overhead or behind back during dressing and bathing tasks.   Goal status: IN PROGRESS  4.  Pt will increase RUE strength to 4+/5 or greater to improve ability to use RUE when lifting or carrying items during meal preparation/housework/yardwork tasks.   Goal status: IN PROGRESS    ASSESSMENT:  CLINICAL IMPRESSION: This session pt presenting to OT with continued mild pain and limited ROM, especially with external rotation and abduction. He was able to demonstrate improved ROM this session, along with improved tolerance to stretching. OT providing hands on assist for positioning during shoulder stretches, due to poor form. Additionally, OT added light weight to supine ROM which he tolerated fair with increased rest breaks. Verbal and tactile cuing for positioning and technique throughout session.    PERFORMANCE DEFICITS: in functional skills including in functional skills including ADLs, IADLs, coordination, tone, ROM, strength, pain, fascial restrictions, muscle spasms, and UE functional use      PLAN:  OT FREQUENCY: 3x/week  OT DURATION: 8 weeks  PLANNED INTERVENTIONS: 97168 OT Re-evaluation, 97535 self care/ADL training, 02889 therapeutic exercise, 97530 therapeutic activity, 97112 neuromuscular re-education, 97140 manual therapy, 97035 ultrasound, 97014 electrical stimulation unattended, patient/family education, and DME and/or AE instructions  CONSULTED AND AGREED  WITH PLAN OF CARE: Patient  PLAN FOR NEXT SESSION: Follow up on HEP, manual techniques, passive stretching, scapular mobility, progressing to A/ROM as tolerated   Valentin Nightingale, OTR/L  [phone] 02/13/2024, 12:51 PM

## 2024-02-14 ENCOUNTER — Encounter (HOSPITAL_COMMUNITY): Payer: PRIVATE HEALTH INSURANCE | Admitting: Occupational Therapy

## 2024-02-22 ENCOUNTER — Inpatient Hospital Stay: Payer: PRIVATE HEALTH INSURANCE | Admitting: Oncology

## 2024-02-22 ENCOUNTER — Inpatient Hospital Stay: Payer: PRIVATE HEALTH INSURANCE

## 2024-02-23 ENCOUNTER — Other Ambulatory Visit: Payer: Self-pay | Admitting: Nurse Practitioner

## 2024-02-25 ENCOUNTER — Encounter (HOSPITAL_COMMUNITY): Payer: PRIVATE HEALTH INSURANCE | Admitting: Occupational Therapy

## 2024-02-26 ENCOUNTER — Telehealth (HOSPITAL_COMMUNITY): Payer: Self-pay | Admitting: Occupational Therapy

## 2024-02-26 NOTE — Telephone Encounter (Signed)
 This OT called both the patients home number and cell number, in order to speak with pt regarding his No Show to appointment on 02/25/24. OT was unable to leave a VM on either phone for patient.   Valentin Nightingale, OTR/L [facility] [phone]

## 2024-02-27 ENCOUNTER — Encounter (HOSPITAL_COMMUNITY): Payer: PRIVATE HEALTH INSURANCE | Admitting: Occupational Therapy

## 2024-02-28 ENCOUNTER — Encounter: Payer: Self-pay | Admitting: Orthopedic Surgery

## 2024-02-28 ENCOUNTER — Ambulatory Visit (INDEPENDENT_AMBULATORY_CARE_PROVIDER_SITE_OTHER): Payer: PRIVATE HEALTH INSURANCE | Admitting: Orthopedic Surgery

## 2024-02-28 DIAGNOSIS — M25511 Pain in right shoulder: Secondary | ICD-10-CM

## 2024-02-28 MED ORDER — HYDROCODONE-ACETAMINOPHEN 5-325 MG PO TABS: 1.0000 | ORAL_TABLET | Freq: Three times a day (TID) | ORAL | 0 refills | Status: DC | PRN

## 2024-02-28 NOTE — Progress Notes (Signed)
 Post-Op Visit Note   Patient: [name] Gregory           Date of Birth: [birth year]           MRN: [id number] Visit Date: 02/28/2024 PCP: Katrinka Aquas, MD   Assessment & Plan:  Chief Complaint:  Chief Complaint  Patient presents with   Right Shoulder - Routine Post Op     right shoulder arthroscopy with rotator interval release and manipulation.  That was done 01/24/2024   Visit Diagnoses: No diagnosis found.  Plan: 5 weeksOut from arthroscopy with rotator interval release and manipulation.  Taking oxycodone  and Robaxin  for pain relief.  He is doing therapy 3 times a week.  Combative along with his 0 turn lawnmower.  Doing home exercise program of climbing the wall.  Doing clinicals and stretches.  On exam he has range of motion of 20/95/150.  This is an improvement over his prior visit.  Plan at this time is to have him wean off oxycodone  and we will refill Norco today and after that, we will change him over to tramadol .  He may want to get the left shoulder worked on once the right shoulder is slightly more functional.  He will let me know when that is the case and we will get him set up.  Follow-Up Instructions: No follow-ups on file.   Orders:  No orders of the defined types were placed in this encounter.  No orders of the defined types were placed in this encounter.   Imaging: No results found.  PMFS History: Patient Active Problem List   Diagnosis Date Noted   Adhesive capsulitis of right shoulder 01/26/2024   Hx of smoking 10/12/2023   Lymphedema 03/26/2023   Bilateral sensorineural hearing loss 01/04/2023   Chronic respiratory failure with hypoxia (HCC) 01/02/2022   HTN (hypertension) 06/10/2020   Loose stools 06/09/2020   GERD (gastroesophageal reflux disease) 06/09/2020   Elevated LFTs 06/09/2020   Early satiety 06/09/2020   Fatty liver 06/09/2020   Dizziness    Hyperglycemia    Liver enzyme elevation    Nausea vomiting and diarrhea    Transaminitis    AKI  (acute kidney injury) (HCC) 07/17/2019   Abdominal pain 07/17/2019   Nasal congestion 07/17/2019   Post-nasal drainage 07/17/2019   OSA on CPAP 06/14/2017   Morbid (severe) obesity due to excess calories (HCC) 06/14/2017   Abdominal pain, epigastric 04/24/2016   RUQ pain 04/24/2016   Microcytic anemia 04/24/2016   Diarrhea 04/24/2016   Elevated sed rate 04/24/2016   Splenomegaly 04/24/2016   Past Medical History:  Diagnosis Date   Alpha thalassaemia minor    Anxiety    Carpal tunnel syndrome    CHF (congestive heart failure) (HCC)    Complication of anesthesia    WOKE UP DURING SURGERY   Diabetes (HCC)    Fatty liver    GERD (gastroesophageal reflux disease)    Hemangioma of liver    Hypertension    NICM (nonischemic cardiomyopathy) (HCC) 11/2021   Rotator cuff disorder    Sleep apnea    URI (upper respiratory infection)     Family History  Problem Relation Age of Onset   Hypertension Mother    CVA Father    Diabetes Father    Throat cancer Father    Heart disease Brother 51   Cervical cancer Maternal Grandmother    Throat cancer Maternal Grandmother    Throat cancer Maternal Grandfather    Bladder Cancer Maternal  Grandfather    Cirrhosis Maternal Grandfather    Colon cancer Neg Hx     Past Surgical History:  Procedure Laterality Date   BIOPSY  05/04/2016   Procedure: BIOPSY;  Surgeon: Lamar CHRISTELLA Hollingshead, MD;  Location: [facility];  Service: Endoscopy;;  gastric bx   CARDIAC CATHETERIZATION Bilateral 10/2021   COLONOSCOPY WITH PROPOFOL  N/A 05/04/2016    PROPOFOL ;  Surgeon: Lamar CHRISTELLA Hollingshead, MD; diverticulosis throughout the entire colon, otherwise normal.  Repeat due in 2027.   ESOPHAGOGASTRODUODENOSCOPY (EGD) WITH PROPOFOL  N/A 05/04/2016   PROPOFOL ;  Surgeon: Lamar CHRISTELLA Hollingshead, MD; LA grade B esophagitis, gastritis, small hiatal hernia.   EXAM UNDER ANESTHESIA WITH MANIPULATION OF SHOULDER Right 01/24/2024   Procedure: EXAM UNDER ANESTHESIA, SHOULDER, WITH  MANIPULATION;  Surgeon: Addie Cordella Hamilton, MD;  Location: [facility] OR;  Service: Orthopedics;  Laterality: Right;  RIGHT SHOULDER MANIPULATION UNDER ANESTHESIA, ARTHROSCOPY, DEBRIDEMENT, ROTATOR INTERVAL RELEASE   EYE SURGERY     POSTERIOR LUMBAR FUSION 2 WITH HARDWARE REMOVAL Right 01/24/2024   Procedure: ARTHROSCOPY, SHOULDER WITH DEBRIDEMENT AND ROTATOR INTERVALRELEASE;  Surgeon: Addie Cordella Hamilton, MD;  Location: [facility] OR;  Service: Orthopedics;  Laterality: Right;   RIGHT HEART CATH Right 06/28/2022   Procedure: RIGHT HEART CATH;  Surgeon: Cherrie Toribio SAUNDERS, MD;  Location: [facility];  Service: Cardiovascular;  Laterality: Right;   SALIVARY GLAND SURGERY  1992   Social History   Occupational History   Occupation: administrative for [employer]  Tobacco Use   Smoking status: Former    Current packs/day: 0.00    Average packs/day: 0.3 packs/day for 36.0 years (9.0 ttl pk-yrs)    Types: Cigarettes    Start date: 11/18/1985    Quit date: 11/18/2021    Years since quitting: 2.2   Smokeless tobacco: Never   Tobacco comments:    QUIT SMOKING IN APRIL  Vaping Use   Vaping status: Former   Quit date: 09/28/2023  Substance and Sexual Activity   Alcohol use: Not Currently    Comment: maybe once a month   Drug use: No   Sexual activity: Not on file

## 2024-02-29 ENCOUNTER — Encounter (HOSPITAL_COMMUNITY): Payer: PRIVATE HEALTH INSURANCE | Admitting: Occupational Therapy

## 2024-03-03 ENCOUNTER — Encounter (HOSPITAL_COMMUNITY): Payer: PRIVATE HEALTH INSURANCE | Admitting: Occupational Therapy

## 2024-03-05 ENCOUNTER — Encounter (HOSPITAL_COMMUNITY): Payer: Self-pay | Admitting: Occupational Therapy

## 2024-03-05 ENCOUNTER — Encounter (HOSPITAL_COMMUNITY): Payer: PRIVATE HEALTH INSURANCE | Admitting: Occupational Therapy

## 2024-03-05 ENCOUNTER — Ambulatory Visit (HOSPITAL_COMMUNITY): Payer: PRIVATE HEALTH INSURANCE | Admitting: Occupational Therapy

## 2024-03-05 DIAGNOSIS — M25511 Pain in right shoulder: Secondary | ICD-10-CM | POA: Diagnosis not present

## 2024-03-05 DIAGNOSIS — M25611 Stiffness of right shoulder, not elsewhere classified: Secondary | ICD-10-CM

## 2024-03-05 DIAGNOSIS — R29898 Other symptoms and signs involving the musculoskeletal system: Secondary | ICD-10-CM

## 2024-03-05 NOTE — Patient Instructions (Signed)

## 2024-03-05 NOTE — Therapy (Signed)
 OUTPATIENT OCCUPATIONAL THERAPY ORTHO TREATMENT PROGRESS NOTE/Mini REASSESSMENT  Patient Name: [name] Gregory MRN: [id number] DOB:06-09-1971, 53 y.o., male Today's Date: 03/05/2024  Progress Note Reporting Period 01/25/24 to 03/05/24  See note below for Objective Data and Assessment of Progress/Goals.    END OF SESSION:  OT End of Session - 03/05/24 1551     Visit Number 7    Number of Visits 24    Date for OT Re-Evaluation 03/25/24   progress note 02/23/24   Authorization Type Generic commercial    OT Start Time 1116    OT Stop Time 1156    OT Time Calculation (min) 40 min    Activity Tolerance Patient tolerated treatment well    Behavior During Therapy WFL for tasks assessed/performed          Past Medical History:  Diagnosis Date   Alpha thalassaemia minor    Anxiety    Carpal tunnel syndrome    CHF (congestive heart failure) (HCC)    Complication of anesthesia    WOKE UP DURING SURGERY   Diabetes (HCC)    Fatty liver    GERD (gastroesophageal reflux disease)    Hemangioma of liver    Hypertension    NICM (nonischemic cardiomyopathy) (HCC) 11/2021   Rotator cuff disorder    Sleep apnea    URI (upper respiratory infection)    Past Surgical History:  Procedure Laterality Date   BIOPSY  05/04/2016   Procedure: BIOPSY;  Surgeon: Lamar CHRISTELLA Hollingshead, MD;  Location: [facility];  Service: Endoscopy;;  gastric bx   CARDIAC CATHETERIZATION Bilateral 10/2021   COLONOSCOPY WITH PROPOFOL  N/A 05/04/2016    PROPOFOL ;  Surgeon: Lamar CHRISTELLA Hollingshead, MD; diverticulosis throughout the entire colon, otherwise normal.  Repeat due in 2027.   ESOPHAGOGASTRODUODENOSCOPY (EGD) WITH PROPOFOL  N/A 05/04/2016   PROPOFOL ;  Surgeon: Lamar CHRISTELLA Hollingshead, MD; LA grade B esophagitis, gastritis, small hiatal hernia.   EXAM UNDER ANESTHESIA WITH MANIPULATION OF SHOULDER Right 01/24/2024   Procedure: EXAM UNDER ANESTHESIA, SHOULDER, WITH MANIPULATION;  Surgeon: Addie Cordella Hamilton, MD;  Location: [facility] OR;   Service: Orthopedics;  Laterality: Right;  RIGHT SHOULDER MANIPULATION UNDER ANESTHESIA, ARTHROSCOPY, DEBRIDEMENT, ROTATOR INTERVAL RELEASE   EYE SURGERY     POSTERIOR LUMBAR FUSION 2 WITH HARDWARE REMOVAL Right 01/24/2024   Procedure: ARTHROSCOPY, SHOULDER WITH DEBRIDEMENT AND ROTATOR INTERVALRELEASE;  Surgeon: Addie Cordella Hamilton, MD;  Location: [facility] OR;  Service: Orthopedics;  Laterality: Right;   RIGHT HEART CATH Right 06/28/2022   Procedure: RIGHT HEART CATH;  Surgeon: Cherrie Toribio SAUNDERS, MD;  Location: [facility];  Service: Cardiovascular;  Laterality: Right;   SALIVARY GLAND SURGERY  1992   Patient Active Problem List   Diagnosis Date Noted   Adhesive capsulitis of right shoulder 01/26/2024   Hx of smoking 10/12/2023   Lymphedema 03/26/2023   Bilateral sensorineural hearing loss 01/04/2023   Chronic respiratory failure with hypoxia (HCC) 01/02/2022   HTN (hypertension) 06/10/2020   Loose stools 06/09/2020   GERD (gastroesophageal reflux disease) 06/09/2020   Elevated LFTs 06/09/2020   Early satiety 06/09/2020   Fatty liver 06/09/2020   Dizziness    Hyperglycemia    Liver enzyme elevation    Nausea vomiting and diarrhea    Transaminitis    AKI (acute kidney injury) (HCC) 07/17/2019   Abdominal pain 07/17/2019   Nasal congestion 07/17/2019   Post-nasal drainage 07/17/2019   OSA on CPAP 06/14/2017   Morbid (severe) obesity due to excess calories (HCC) 06/14/2017  Abdominal pain, epigastric 04/24/2016   RUQ pain 04/24/2016   Microcytic anemia 04/24/2016   Diarrhea 04/24/2016   Elevated sed rate 04/24/2016   Splenomegaly 04/24/2016    PCP: Dr. Karna Lukes  REFERRING PROVIDER: Dr. Cordella Glendia Hutchinson  ONSET DATE: 01/24/24  REFERRING DIAG: M25.511 (ICD-10-CM) - Acute pain of right shoulder   THERAPY DIAG:  Acute pain of right shoulder  Stiffness of right shoulder, not elsewhere classified  Other symptoms and signs involving the musculoskeletal  system  Rationale for Evaluation and Treatment: Rehabilitation  SUBJECTIVE:   SUBJECTIVE STATEMENT: S: I think it is getting better  PERTINENT HISTORY: Pt is a 53 y/o male s/p right shoulder arthroscopic debridement and rotator interval release as well as manipulation on 01/24/24. 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 Pt presents in standard sling for evaluation.   PRECAUTIONS: Shoulder  WEIGHT BEARING RESTRICTIONS: No  PAIN:  Are you having pain? Yes: NPRS scale: 2/10 Pain location: right shoulder Pain description: aching like a toothache Aggravating factors: moving it out to the side Relieving factors: nothing  FALLS: Has patient fallen in last 6 months? No  PLOF: Independent  PATIENT GOALS: To have less pain and more shoulder mobility  NEXT MD VISIT: 02/18/24  OBJECTIVE:   HAND DOMINANCE: Right  ADLs: Overall ADLs: Pt reports difficulty with reaching overhead, behind back, and across body. Has difficulty with putting belt on, bathing, trying to lift items and try to transfer it. Sleeping is horrible, can only sleep on his back.   FUNCTIONAL OUTCOME MEASURES: Upper Extremity Functional Scale (UEFS): 13/80=16%  UPPER EXTREMITY ROM:       Assessed in supine, er/IR adducted  Passive ROM Right eval  Shoulder flexion 35  Shoulder abduction 55  Shoulder internal rotation 90  Shoulder external rotation -8  (Blank rows = not tested)    Assessed in seated, er/IR adducted  Active ROM Right eval Right 03/05/24  Shoulder flexion  141  Shoulder abduction  150  Shoulder internal rotation  90  Shoulder external rotation  51  (Blank rows = not tested)  UPPER EXTREMITY MMT:     Not assessed at evaluation due to precautions/pain  MMT Right eval Right 03/05/24  Shoulder flexion  4/5  Shoulder abduction  4+/5  Shoulder internal rotation  4+/5  Shoulder external rotation  4/5  (Blank rows = not tested)  EDEMA: mild secondary to sx  6/12  OBSERVATIONS: mod fascial restrictions in right upper arm, anterior shoulder, and trapezius regions   TODAY'S TREATMENT:                                                                                                                              DATE:   03/05/24 -A/ROM: seated, flexion, abduction, protraction, horizontal abduction, er/IR, x10 -Scapular Strengthening: red band, extension, retraction, rows, x10 -Shoulder Strengthening: red band, er, IR, horizontal abduction, x10 -Functional Reaching: 2#, counter to 1st shelf, to  2nd shelf, x10 each -Wall Slides: flexion, abduction, x10 -Overhead lacing -Reassessment for progress note  02/13/24 -Pulleys: flexion, abduction, x60 -A/ROM: supine, 2#, flexion, abduction, protraction, horizontal abduction, er/IR, x10 -Proximal shoulder exercises: supine, 1#, paddles, criss cross, circles both directions, x10 -ABC's in the air -A/ROM: seated, flexion, abduction, protraction, horizontal abduction, er/IR, x10 -stretches: flexion, er doorway, corner stretch, 2x20  02/06/24 -Manual techniques: retrograde massage and myofascial release to right upper arm, anterior shoulder, trapezius, and scapular regions to decrease edema, pain, and fascial restrictions and increase joint ROM -P/ROM: supine-flexion, abduction, er, IR, horizontal abduction, 5 reps -A/ROM: RUE only- supine-protraction, flexion, er, horizontal abduction, abduction, 10 reps -Shoulder stretches: flexion, doorway stretch, 2x10 holds -A/ROM: RUE only, sitting-protraction, flexion, er, horizontal abduction, abduction, 10 reps -Wall wash: 1'  -Thumb tacks: shoulder height, 1'   PATIENT EDUCATION: Education details: Publishing rights manager Person educated: Patient Education method: Explanation, Demonstration, and Handouts Education comprehension: verbalized understanding and returned demonstration  HOME EXERCISE PROGRAM: Eval: pendulums, wrist A/ROM 6/20: AA/ROM 6/25:  A/ROM 7/2 Shoulder stretches 7/23: Scapular Strengthening  GOALS: Goals reviewed with patient? Yes   SHORT TERM GOALS: Target date: 02/24/24  Pt will be provided with and educated on HEP to improve mobility in RUE required for use during ADL completion.   Goal status: IN PROGRESS  2.  Pt will increase RUE P/ROM by 75 degrees to improve ability to use RUE during dressing tasks with minimal compensatory techniques.   Goal status: IN PROGRESS  3.  Pt will increase RUE strength to 3+/5 to improve ability to reach for items at waist to chest height during bathing and grooming tasks.   Goal status: IN PROGRESS    LONG TERM GOALS: Target date: 03/26/24  Pt will decrease pain in RUE to 3/10 or less to improve ability to sleep for 2+ consecutive hours without waking due to pain.   Goal status: IN PROGRESS  2.  Pt will decrease RUE fascial restrictions to min amounts or less to improve mobility required for functional reaching tasks.   Goal status: IN PROGRESS  3.  Pt will increase RUE A/ROM by 75+ degrees to improve ability to use RUE when reaching overhead or behind back during dressing and bathing tasks.   Goal status: IN PROGRESS  4.  Pt will increase RUE strength to 4+/5 or greater to improve ability to use RUE when lifting or carrying items during meal preparation/housework/yardwork tasks.   Goal status: IN PROGRESS    ASSESSMENT:  CLINICAL IMPRESSION: Pt completed reassessment this session, where he demonstrated greatly improving ROM and strength. He continues to work on Print production planner and endurance this session. Additionally he is working on his functional reaching, which he tolerated well with light weights. Verbal and tactile cuing provided for positioning and technique  throughout session.    PERFORMANCE DEFICITS: in functional skills including in functional skills including ADLs, IADLs, coordination, tone, ROM, strength, pain, fascial restrictions, muscle spasms, and  UE functional use      PLAN:  OT FREQUENCY: 3x/week  OT DURATION: 8 weeks  PLANNED INTERVENTIONS: 97168 OT Re-evaluation, 97535 self care/ADL training, 02889 therapeutic exercise, 97530 therapeutic activity, 97112 neuromuscular re-education, 97140 manual therapy, 97035 ultrasound, 97014 electrical stimulation unattended, patient/family education, and DME and/or AE instructions  CONSULTED AND AGREED WITH PLAN OF CARE: Patient  PLAN FOR NEXT SESSION: Follow up on HEP, manual techniques, passive stretching, scapular mobility, progressing to A/ROM as tolerated   Valentin Nightingale, OTR/L  [phone] 03/05/2024, 3:52 PM

## 2024-03-07 ENCOUNTER — Encounter (HOSPITAL_COMMUNITY): Payer: PRIVATE HEALTH INSURANCE | Admitting: Occupational Therapy

## 2024-03-10 ENCOUNTER — Encounter (HOSPITAL_COMMUNITY): Payer: PRIVATE HEALTH INSURANCE | Admitting: Occupational Therapy

## 2024-03-12 ENCOUNTER — Encounter (HOSPITAL_COMMUNITY): Payer: PRIVATE HEALTH INSURANCE | Admitting: Occupational Therapy

## 2024-03-14 ENCOUNTER — Ambulatory Visit (HOSPITAL_COMMUNITY): Payer: PRIVATE HEALTH INSURANCE | Attending: Orthopedic Surgery | Admitting: Occupational Therapy

## 2024-03-14 ENCOUNTER — Encounter (HOSPITAL_COMMUNITY): Payer: Self-pay | Admitting: Occupational Therapy

## 2024-03-14 DIAGNOSIS — R29898 Other symptoms and signs involving the musculoskeletal system: Secondary | ICD-10-CM | POA: Diagnosis present

## 2024-03-14 DIAGNOSIS — M25611 Stiffness of right shoulder, not elsewhere classified: Secondary | ICD-10-CM | POA: Diagnosis present

## 2024-03-14 DIAGNOSIS — M25511 Pain in right shoulder: Secondary | ICD-10-CM | POA: Insufficient documentation

## 2024-03-14 NOTE — Therapy (Signed)
 OUTPATIENT OCCUPATIONAL THERAPY ORTHO TREATMENT PROGRESS NOTE/Mini REASSESSMENT  Patient Name: [name] Gregory MRN: [id number] DOB:1971/07/08, 53 y.o., male Today's Date: 03/14/2024  OCCUPATIONAL THERAPY DISCHARGE SUMMARY  Visits from Start of Care: 8  Current functional level related to goals / functional outcomes: Pt has reached all OT goals. Strength, ROM, and fascial restrictions all WFL. Pt reporting no pain present with mobility.    Remaining deficits: Pt has no remaining OT deficits with his RUE.   Education / Equipment: Pt provided comprehensive HEP.    Plan: Patient agrees to discharge as all OT goals have been met.      END OF SESSION:  OT End of Session - 03/14/24 1211     Visit Number 8    Number of Visits 24    Date for OT Re-Evaluation 03/25/24   progress note 02/23/24   Authorization Type Generic commercial    OT Start Time 1113    OT Stop Time 1145    OT Time Calculation (min) 32 min    Activity Tolerance Patient tolerated treatment well    Behavior During Therapy WFL for tasks assessed/performed           Past Medical History:  Diagnosis Date   Alpha thalassaemia minor    Anxiety    Carpal tunnel syndrome    CHF (congestive heart failure) (HCC)    Complication of anesthesia    WOKE UP DURING SURGERY   Diabetes (HCC)    Fatty liver    GERD (gastroesophageal reflux disease)    Hemangioma of liver    Hypertension    NICM (nonischemic cardiomyopathy) (HCC) 11/2021   Rotator cuff disorder    Sleep apnea    URI (upper respiratory infection)    Past Surgical History:  Procedure Laterality Date   BIOPSY  05/04/2016   Procedure: BIOPSY;  Surgeon: Lamar CHRISTELLA Hollingshead, MD;  Location: [facility];  Service: Endoscopy;;  gastric bx   CARDIAC CATHETERIZATION Bilateral 10/2021   COLONOSCOPY WITH PROPOFOL  N/A 05/04/2016    PROPOFOL ;  Surgeon: Lamar CHRISTELLA Hollingshead, MD; diverticulosis throughout the entire colon, otherwise normal.  Repeat due in 2027.    ESOPHAGOGASTRODUODENOSCOPY (EGD) WITH PROPOFOL  N/A 05/04/2016   PROPOFOL ;  Surgeon: Lamar CHRISTELLA Hollingshead, MD; LA grade B esophagitis, gastritis, small hiatal hernia.   EXAM UNDER ANESTHESIA WITH MANIPULATION OF SHOULDER Right 01/24/2024   Procedure: EXAM UNDER ANESTHESIA, SHOULDER, WITH MANIPULATION;  Surgeon: Addie Cordella Hamilton, MD;  Location: [facility] OR;  Service: Orthopedics;  Laterality: Right;  RIGHT SHOULDER MANIPULATION UNDER ANESTHESIA, ARTHROSCOPY, DEBRIDEMENT, ROTATOR INTERVAL RELEASE   EYE SURGERY     POSTERIOR LUMBAR FUSION 2 WITH HARDWARE REMOVAL Right 01/24/2024   Procedure: ARTHROSCOPY, SHOULDER WITH DEBRIDEMENT AND ROTATOR INTERVALRELEASE;  Surgeon: Addie Cordella Hamilton, MD;  Location: [facility] OR;  Service: Orthopedics;  Laterality: Right;   RIGHT HEART CATH Right 06/28/2022   Procedure: RIGHT HEART CATH;  Surgeon: Cherrie Toribio SAUNDERS, MD;  Location: [facility];  Service: Cardiovascular;  Laterality: Right;   SALIVARY GLAND SURGERY  1992   Patient Active Problem List   Diagnosis Date Noted   Adhesive capsulitis of right shoulder 01/26/2024   Hx of smoking 10/12/2023   Lymphedema 03/26/2023   Bilateral sensorineural hearing loss 01/04/2023   Chronic respiratory failure with hypoxia (HCC) 01/02/2022   HTN (hypertension) 06/10/2020   Loose stools 06/09/2020   GERD (gastroesophageal reflux disease) 06/09/2020   Elevated LFTs 06/09/2020   Early satiety 06/09/2020   Fatty liver 06/09/2020  Dizziness    Hyperglycemia    Liver enzyme elevation    Nausea vomiting and diarrhea    Transaminitis    AKI (acute kidney injury) (HCC) 07/17/2019   Abdominal pain 07/17/2019   Nasal congestion 07/17/2019   Post-nasal drainage 07/17/2019   OSA on CPAP 06/14/2017   Morbid (severe) obesity due to excess calories (HCC) 06/14/2017   Abdominal pain, epigastric 04/24/2016   RUQ pain 04/24/2016   Microcytic anemia 04/24/2016   Diarrhea 04/24/2016   Elevated sed rate 04/24/2016   Splenomegaly  04/24/2016    PCP: Dr. Karna Lukes  REFERRING PROVIDER: Dr. Cordella Glendia Hutchinson  ONSET DATE: 01/24/24  REFERRING DIAG: M25.511 (ICD-10-CM) - Acute pain of right shoulder   THERAPY DIAG:  Acute pain of right shoulder  Stiffness of right shoulder, not elsewhere classified  Other symptoms and signs involving the musculoskeletal system  Rationale for Evaluation and Treatment: Rehabilitation  SUBJECTIVE:   SUBJECTIVE STATEMENT: S: Its really been doing well  PERTINENT HISTORY: Pt is a 53 y/o male s/p right shoulder arthroscopic debridement and rotator interval release as well as manipulation on 01/24/24. 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 Pt presents in standard sling for evaluation.   PRECAUTIONS: Shoulder  WEIGHT BEARING RESTRICTIONS: No  PAIN:  Are you having pain? No  FALLS: Has patient fallen in last 6 months? No  PLOF: Independent  PATIENT GOALS: To have less pain and more shoulder mobility  NEXT MD VISIT: 02/18/24  OBJECTIVE:   HAND DOMINANCE: Right  ADLs: Overall ADLs: Pt reports difficulty with reaching overhead, behind back, and across body. Has difficulty with putting belt on, bathing, trying to lift items and try to transfer it. Sleeping is horrible, can only sleep on his back.   FUNCTIONAL OUTCOME MEASURES: Upper Extremity Functional Scale (UEFS): 13/80=16%  UPPER EXTREMITY ROM:       Assessed in supine, er/IR adducted  Passive ROM Right eval  Shoulder flexion 35  Shoulder abduction 55  Shoulder internal rotation 90  Shoulder external rotation -8  (Blank rows = not tested)    Assessed in seated, er/IR adducted  Active ROM Right eval Right 03/05/24 Right 03/14/24  Shoulder flexion  141 156  Shoulder abduction  150 155  Shoulder internal rotation  90 90  Shoulder external rotation  51 53  (Blank rows = not tested)  UPPER EXTREMITY MMT:     Not assessed at evaluation due to precautions/pain  MMT Right eval  Right 03/05/24 Right 03/14/24  Shoulder flexion  4/5 5/5  Shoulder abduction  4+/5 5/5  Shoulder internal rotation  4+/5 5/5  Shoulder external rotation  4/5 5/5  (Blank rows = not tested)  EDEMA: mild secondary to sx 6/12  OBSERVATIONS: mod fascial restrictions in right upper arm, anterior shoulder, and trapezius regions   TODAY'S TREATMENT:  DATE:   03/14/24 -A/ROM: seated, flexion, abduction, protraction, horizontal abduction, er/IR, x10 -PNF Strengthening: red band, chest pulls, Overhead pulls, er pulls, PNF up, PNF down, x12 -Scapular Strengthening: green band, extension, retraction, rows, x12 -Shoulder Strengthening: green band, er, IR, horizontal abduction, x12 -Measurements taken for discharge  03/05/24 -A/ROM: seated, flexion, abduction, protraction, horizontal abduction, er/IR, x10 -Scapular Strengthening: red band, extension, retraction, rows, x10 -Shoulder Strengthening: red band, er, IR, horizontal abduction, x10 -Functional Reaching: 2#, counter to 1st shelf, to 2nd shelf, x10 each -Wall Slides: flexion, abduction, x10 -Overhead lacing -Reassessment for progress note  02/13/24 -Pulleys: flexion, abduction, x60 -A/ROM: supine, 2#, flexion, abduction, protraction, horizontal abduction, er/IR, x10 -Proximal shoulder exercises: supine, 1#, paddles, criss cross, circles both directions, x10 -ABC's in the air -A/ROM: seated, flexion, abduction, protraction, horizontal abduction, er/IR, x10 -stretches: flexion, er doorway, corner stretch, 2x20    PATIENT EDUCATION: Education details: Shoulder and PNF strengthening Person educated: Patient Education method: Programmer, multimedia, Demonstration, and Handouts Education comprehension: verbalized understanding and returned demonstration  HOME EXERCISE PROGRAM: Eval: pendulums, wrist A/ROM 6/20: AA/ROM 6/25:  A/ROM 7/2 Shoulder stretches 7/23: Scapular Strengthening 8/1: Shoulder and PNF strengthening  GOALS: Goals reviewed with patient? Yes   SHORT TERM GOALS: Target date: 02/24/24  Pt will be provided with and educated on HEP to improve mobility in RUE required for use during ADL completion.   Goal status: MET  2.  Pt will increase RUE P/ROM by 75 degrees to improve ability to use RUE during dressing tasks with minimal compensatory techniques.   Goal status: MET  3.  Pt will increase RUE strength to 3+/5 to improve ability to reach for items at waist to chest height during bathing and grooming tasks.   Goal status: MET    LONG TERM GOALS: Target date: 03/26/24  Pt will decrease pain in RUE to 3/10 or less to improve ability to sleep for 2+ consecutive hours without waking due to pain.   Goal status: MET  2.  Pt will decrease RUE fascial restrictions to min amounts or less to improve mobility required for functional reaching tasks.   Goal status: MET  3.  Pt will increase RUE A/ROM by 75+ degrees to improve ability to use RUE when reaching overhead or behind back during dressing and bathing tasks.   Goal status: MET  4.  Pt will increase RUE strength to 4+/5 or greater to improve ability to use RUE when lifting or carrying items during meal preparation/housework/yardwork tasks.   Goal status: MET    ASSESSMENT:  CLINICAL IMPRESSION: Pt reported that he feels close to his baseline with his RUE and feels ready to be discharged from OT. This session he reviewed all HEP and added to his theraband exercises, in order to continue improving and maintaining his strength. Pt has no further skilled OT needs and will be discharged from OT.   PERFORMANCE DEFICITS: in functional skills including in functional skills including ADLs, IADLs, coordination, tone, ROM, strength, pain, fascial restrictions, muscle spasms, and UE functional use      PLAN:  OT FREQUENCY: 3x/week  OT  DURATION: 8 weeks  PLANNED INTERVENTIONS: 97168 OT Re-evaluation, 97535 self care/ADL training, 02889 therapeutic exercise, 97530 therapeutic activity, 97112 neuromuscular re-education, 97140 manual therapy, 97035 ultrasound, 97014 electrical stimulation unattended, patient/family education, and DME and/or AE instructions  CONSULTED AND AGREED WITH PLAN OF CARE: Patient  PLAN FOR NEXT SESSION: Discharge   Valentin Nightingale, OTR/L  [phone] 03/14/2024, 12:12 PM

## 2024-03-14 NOTE — Patient Instructions (Signed)
Theraband strengthening: Complete 10-15X, 1-2X/day  1) Shoulder protraction  Anchor band in doorway, stand with back to door. Push your hand forward as much as you can to bringing your shoulder blades forward on your rib cage.      2) Shoulder horizontal abduction  Standing with a theraband anchored at chest height, begin with arm straight and some tension in the band. Move your arm out to your side (keeping straight the whole time). Bring the affected arm back to midline.     3) Shoulder Internal Rotation  While holding an elastic band at your side with your elbow bent, start with your hand away from your stomach, then pull the band towards your stomach. Keep your elbow near your side the entire time.     4) Shoulder External Rotation  While holding an elastic band at your side with your elbow bent, start with your hand near your stomach and then pull the band away. Keep your elbow at your side the entire time.     5) Shoulder flexion  While standing with back to the door, holding Theraband at hand level, raise arm in front of you.  Keep elbow straight through entire movement.      6) Shoulder abduction  While holding an elastic band at your side, draw up your arm to the side keeping your elbow straight.    1) Strengthening: Chest Pull - Resisted   Hold Theraband in front of body with hands about shoulder width a part. Pull band a part and back together slowly. Repeat __10-15__ times. Complete ___1_ set(s) per session.. Repeat ___1_ session(s) per day.  http://orth.exer.us/926   Copyright  VHI. All rights reserved.   2) PNF Strengthening: Resisted   Standing with resistive band around each hand, bring right arm up and away, thumb back. Repeat _10-15___ times per set. Do __1__ sets per session. Do __1__ sessions per day.    3) Resisted External Rotation: in Neutral - Bilateral   Sit or stand, tubing in both hands, elbows at sides, bent to 90, forearms  forward. Pinch shoulder blades together and rotate forearms out. Keep elbows at sides. Repeat _10-15___ times per set. Do __1__ sets per session. Do _1___ sessions per day.  http://orth.exer.us/966   Copyright  VHI. All rights reserved.   4) PNF Strengthening: Resisted   Standing, hold resistive band above head. Bring right arm down and out from side. Repeat _10-15___ times per set. Do __1__ sets per session. Do __1__ sessions per day.  http://orth.exer.us/922   Copyright  VHI. All rights reserved.

## 2024-03-17 ENCOUNTER — Encounter (HOSPITAL_COMMUNITY): Payer: PRIVATE HEALTH INSURANCE | Admitting: Occupational Therapy

## 2024-03-19 ENCOUNTER — Encounter (HOSPITAL_COMMUNITY): Payer: PRIVATE HEALTH INSURANCE | Admitting: Occupational Therapy

## 2024-03-21 ENCOUNTER — Encounter (HOSPITAL_COMMUNITY): Payer: PRIVATE HEALTH INSURANCE | Admitting: Occupational Therapy

## 2024-03-23 ENCOUNTER — Other Ambulatory Visit: Payer: Self-pay | Admitting: Family

## 2024-03-24 ENCOUNTER — Encounter (HOSPITAL_COMMUNITY): Payer: PRIVATE HEALTH INSURANCE | Admitting: Occupational Therapy

## 2024-03-25 DIAGNOSIS — J45909 Unspecified asthma, uncomplicated: Secondary | ICD-10-CM | POA: Insufficient documentation

## 2024-03-25 NOTE — Progress Notes (Signed)
 MRN : [id number]  [name] Gregory is a 53 y.o. (12/22/1970) male who presents with chief complaint of legs swell.  History of Present Illness:   The patient returns to the office for followup evaluation regarding leg swelling.  The swelling has persisted but with the lymph pump is under much, much better controlled.  The only minor interval change is he has noticed a bit of increased swelling in the left foot and ankle area.  The pain associated with swelling is decreased. There have not been any interval development of a ulcerations or wounds.   The patient denies problems with the pump, noting it is working well and the leggings are in good condition.   Since the previous visit the patient has been wearing graduated compression stockings and using the lymph pump on a routine basis and  has noted significant improvement in the lymphedema.    Patient stated the lymph pump has been helpful with the treatment of the lymphedema.    Previous noninvasive study showed no evidence of superficial venous reflux or DVT.   Previous noninvasive studies show no evidence of significant carotid disease.  His bilateral vertebral arteries have antegrade flow.  Normal flow hemodynamics in the bilateral subclavian arteries.   Previous ABI of 1.19 on the right and 1.10 on the left.  He has triphasic tibial artery waveforms bilaterally with good toe waveforms bilaterally.  No outpatient medications have been marked as taking for the 03/27/24 encounter (Appointment) with Jama, Cordella MATSU, MD.    Past Medical History:  Diagnosis Date   Alpha thalassaemia minor    Anxiety    Carpal tunnel syndrome    CHF (congestive heart failure) (HCC)    Complication of anesthesia    WOKE UP DURING SURGERY   Diabetes (HCC)    Fatty liver    GERD (gastroesophageal reflux disease)    Hemangioma of liver    Hypertension    NICM (nonischemic cardiomyopathy) (HCC) 11/2021   Rotator cuff  disorder    Sleep apnea    URI (upper respiratory infection)     Past Surgical History:  Procedure Laterality Date   BIOPSY  05/04/2016   Procedure: BIOPSY;  Surgeon: Lamar CHRISTELLA Hollingshead, MD;  Location: [facility];  Service: Endoscopy;;  gastric bx   CARDIAC CATHETERIZATION Bilateral 10/2021   COLONOSCOPY WITH PROPOFOL  N/A 05/04/2016    PROPOFOL ;  Surgeon: Lamar CHRISTELLA Hollingshead, MD; diverticulosis throughout the entire colon, otherwise normal.  Repeat due in 2027.   ESOPHAGOGASTRODUODENOSCOPY (EGD) WITH PROPOFOL  N/A 05/04/2016   PROPOFOL ;  Surgeon: Lamar CHRISTELLA Hollingshead, MD; LA grade B esophagitis, gastritis, small hiatal hernia.   EXAM UNDER ANESTHESIA WITH MANIPULATION OF SHOULDER Right 01/24/2024   Procedure: EXAM UNDER ANESTHESIA, SHOULDER, WITH MANIPULATION;  Surgeon: Addie Cordella Hamilton, MD;  Location: [facility] OR;  Service: Orthopedics;  Laterality: Right;  RIGHT SHOULDER MANIPULATION UNDER ANESTHESIA, ARTHROSCOPY, DEBRIDEMENT, ROTATOR INTERVAL RELEASE   EYE SURGERY     POSTERIOR LUMBAR FUSION 2 WITH HARDWARE REMOVAL Right 01/24/2024   Procedure: ARTHROSCOPY, SHOULDER WITH DEBRIDEMENT AND ROTATOR INTERVALRELEASE;  Surgeon: Addie Cordella Hamilton, MD;  Location: [facility] OR;  Service: Orthopedics;  Laterality: Right;   RIGHT HEART CATH Right 06/28/2022   Procedure: RIGHT HEART CATH;  Surgeon: Cherrie Toribio SAUNDERS, MD;  Location: [facility] INVASIVE [facility] LAB;  Service: Cardiovascular;  Laterality: Right;   SALIVARY GLAND SURGERY  1992    Social History Social History   Tobacco Use   Smoking status: Former    Current packs/day: 0.00    Average packs/day: 0.3 packs/day for 36.0 years (9.0 ttl pk-yrs)    Types: Cigarettes    Start date: 11/18/1985    Quit date: 11/18/2021    Years since quitting: 2.3   Smokeless tobacco: Never   Tobacco comments:    QUIT SMOKING IN APRIL  Vaping Use   Vaping status: Former   Quit date: 09/28/2023  Substance Use Topics   Alcohol use: Not Currently    Comment: maybe once a month   Drug  use: No    Family History Family History  Problem Relation Age of Onset   Hypertension Mother    CVA Father    Diabetes Father    Throat cancer Father    Heart disease Brother 29   Cervical cancer Maternal Grandmother    Throat cancer Maternal Grandmother    Throat cancer Maternal Grandfather    Bladder Cancer Maternal Grandfather    Cirrhosis Maternal Grandfather    Colon cancer Neg Hx     No Known Allergies   REVIEW OF SYSTEMS (Negative unless checked)  Constitutional: [] Weight loss  [] Fever  [] Chills Cardiac: [] Chest pain   [] Chest pressure   [] Palpitations   [] Shortness of breath when laying flat   [] Shortness of breath with exertion. Vascular:  [] Pain in legs with walking   [x] Pain in legs with standing  [] History of DVT   [] Phlebitis   [x] Swelling in legs   [] Varicose veins   [] Non-healing ulcers Pulmonary:   [] Uses home oxygen   [] Productive cough   [] Hemoptysis   [] Wheeze  [] COPD   [] Asthma Neurologic:  [] Dizziness   [] Seizures   [] History of stroke   [] History of TIA  [] Aphasia   [] Vissual changes   [] Weakness or numbness in arm   [] Weakness or numbness in leg Musculoskeletal:   [] Joint swelling   [] Joint pain   [] Low back pain Hematologic:  [] Easy bruising  [] Easy bleeding   [] Hypercoagulable state   [] Anemic Gastrointestinal:  [] Diarrhea   [] Vomiting  [] Gastroesophageal reflux/heartburn   [] Difficulty swallowing. Genitourinary:  [] Chronic kidney disease   [] Difficult urination  [] Frequent urination   [] Blood in urine Skin:  [] Rashes   [] Ulcers  Psychological:  [] History of anxiety   []  History of major depression.  Physical Examination  There were no vitals filed for this visit. There is no height or weight on file to calculate BMI. Gen: WD/WN, NAD Head: [place]/AT, No temporalis wasting.  Ear/Nose/Throat: Hearing grossly intact, nares w/o erythema or drainage, pinna without lesions Eyes: PER, EOMI, sclera nonicteric.  Neck: Supple, no gross masses.  No JVD.   Pulmonary:  Good air movement, no audible wheezing, no use of accessory muscles.  Cardiac: RRR, precordium not hyperdynamic. Vascular:  scattered varicosities present bilaterally.  Mild venous stasis changes to the legs bilaterally.  1-2+ soft pitting edema left ankle more affected than the right, CEAP [id number]  Vessel Right Left  Radial Palpable Palpable  Gastrointestinal: soft, non-distended. No guarding/no peritoneal signs.  Musculoskeletal: M/S 5/5 throughout.  No deformity.  Neurologic: CN 2-12 intact. Pain and light touch intact in extremities.  Symmetrical.  Speech is fluent. Motor exam as listed above. Psychiatric: Judgment intact, Mood & affect appropriate for pt's clinical situation. Dermatologic: Venous rashes no ulcers noted.  No changes consistent with cellulitis. Lymph : No lichenification or  skin changes of chronic lymphedema.  CBC Lab Results  Component Value Date   WBC 9.1 01/24/2024   HGB 10.9 (L) 01/24/2024   HCT 35.0 (L) 01/24/2024   MCV 73.7 (L) 01/24/2024   PLT 304 01/24/2024    BMET    Component Value Date/Time   NA 135 01/24/2024 1237   NA 138 09/28/2023 1212   K 4.5 01/24/2024 1237   CL 101 01/24/2024 1237   CO2 25 01/24/2024 1237   GLUCOSE 120 (H) 01/24/2024 1237   BUN 23 (H) 01/24/2024 1237   BUN 22 09/28/2023 1212   CREATININE 1.17 01/24/2024 1237   CREATININE 1.14 11/23/2023 1146   CREATININE 0.72 03/18/2016 1352   CALCIUM  9.2 01/24/2024 1237   GFRNONAA >60 01/24/2024 1237   GFRNONAA >60 11/23/2023 1146   GFRNONAA >89 03/18/2016 1352   GFRAA >60 07/18/2019 0430   GFRAA >89 03/18/2016 1352   CrCl cannot be calculated (Patient's most recent lab result is older than the maximum 21 days allowed.).  COAG Lab Results  Component Value Date   INR 1.0 12/16/2008    Radiology No results found.   Assessment/Plan 1. Lymphedema (Primary) Recommend:  No surgery or intervention at this point in time.    I have reviewed my discussion with  the patient regarding lymphedema and why it  causes symptoms.  Patient will continue wearing graduated compression on a daily basis.  We discussed adding a additional sock versus sleeve to the left foot and ankle area.  On the Internet we looked up the [employer] sensitive foot crew sock that could be added in addition to his 20-30 compression.  Patient agreed with this and will investigate further.  The patient should put the compression on first thing in the morning and removing them in the evening. The patient should not sleep in the compression.   In addition, behavioral modification throughout the day will be continued.  This will include frequent elevation (such as in a recliner), use of over the counter pain medications as needed and exercise such as walking.  The systemic causes for chronic edema such as liver, kidney and cardiac etiologies does not appear to have significant changed over the past year.    The patient will continue aggressive use of the  lymph pump.  This will continue to improve the edema control and prevent sequela such as ulcers and infections.   The patient will follow-up with me on an annual basis.   2. Gastroesophageal reflux disease, unspecified whether esophagitis present Continue PPI as already ordered, this medication has been reviewed and there are no changes at this time.  Avoidence of caffeine and alcohol  Moderate elevation of the head of the bed   3. Primary hypertension Continue antihypertensive medications as already ordered, these medications have been reviewed and there are no changes at this time.  4. Chronic asthma, unspecified asthma severity, unspecified whether complicated, unspecified whether persistent Continue pulmonary medications and aerosols as already ordered, these medications have been reviewed and there are no changes at this time.     Cordella Shawl, MD  03/25/2024 12:04 PM

## 2024-03-27 ENCOUNTER — Ambulatory Visit (INDEPENDENT_AMBULATORY_CARE_PROVIDER_SITE_OTHER): Payer: PRIVATE HEALTH INSURANCE | Admitting: Vascular Surgery

## 2024-03-27 ENCOUNTER — Encounter (INDEPENDENT_AMBULATORY_CARE_PROVIDER_SITE_OTHER): Payer: Self-pay | Admitting: Vascular Surgery

## 2024-03-27 VITALS — BP 96/75 | HR 96 | Resp 18 | Wt 309.6 lb

## 2024-03-27 DIAGNOSIS — I10 Essential (primary) hypertension: Secondary | ICD-10-CM

## 2024-03-27 DIAGNOSIS — K219 Gastro-esophageal reflux disease without esophagitis: Secondary | ICD-10-CM

## 2024-03-27 DIAGNOSIS — I890 Lymphedema, not elsewhere classified: Secondary | ICD-10-CM | POA: Diagnosis not present

## 2024-03-27 DIAGNOSIS — J45909 Unspecified asthma, uncomplicated: Secondary | ICD-10-CM

## 2024-04-02 ENCOUNTER — Telehealth: Payer: Self-pay | Admitting: Oncology

## 2024-04-02 ENCOUNTER — Telehealth: Payer: Self-pay | Admitting: Orthopedic Surgery

## 2024-04-02 NOTE — Telephone Encounter (Signed)
 Patient called and said that he is finished with the hydrocodone  and Addie said he would send in Tramadol . [phone]

## 2024-04-03 ENCOUNTER — Other Ambulatory Visit: Payer: Self-pay | Admitting: Surgical

## 2024-04-03 MED ORDER — TRAMADOL HCL 50 MG PO TABS: 50.0000 mg | ORAL_TABLET | Freq: Four times a day (QID) | ORAL | 0 refills | Status: DC | PRN

## 2024-04-03 NOTE — Telephone Encounter (Signed)
 Sent in

## 2024-04-05 ENCOUNTER — Other Ambulatory Visit: Payer: Self-pay | Admitting: Medical Genetics

## 2024-04-11 ENCOUNTER — Other Ambulatory Visit: Payer: Self-pay | Admitting: Oncology

## 2024-04-15 ENCOUNTER — Other Ambulatory Visit: Payer: PRIVATE HEALTH INSURANCE

## 2024-04-18 ENCOUNTER — Inpatient Hospital Stay: Payer: PRIVATE HEALTH INSURANCE | Attending: Oncology | Admitting: Oncology

## 2024-04-18 ENCOUNTER — Encounter: Payer: Self-pay | Admitting: Oncology

## 2024-04-18 ENCOUNTER — Inpatient Hospital Stay: Payer: PRIVATE HEALTH INSURANCE

## 2024-04-18 ENCOUNTER — Other Ambulatory Visit: Payer: Self-pay

## 2024-04-18 VITALS — BP 93/67 | HR 88 | Temp 97.6°F | Resp 17 | Ht 74.0 in | Wt 316.0 lb

## 2024-04-18 DIAGNOSIS — Z807 Family history of other malignant neoplasms of lymphoid, hematopoietic and related tissues: Secondary | ICD-10-CM | POA: Diagnosis not present

## 2024-04-18 DIAGNOSIS — J9611 Chronic respiratory failure with hypoxia: Secondary | ICD-10-CM | POA: Diagnosis not present

## 2024-04-18 DIAGNOSIS — R5383 Other fatigue: Secondary | ICD-10-CM | POA: Diagnosis not present

## 2024-04-18 DIAGNOSIS — D563 Thalassemia minor: Secondary | ICD-10-CM | POA: Insufficient documentation

## 2024-04-18 DIAGNOSIS — I429 Cardiomyopathy, unspecified: Secondary | ICD-10-CM | POA: Insufficient documentation

## 2024-04-18 DIAGNOSIS — D509 Iron deficiency anemia, unspecified: Secondary | ICD-10-CM | POA: Insufficient documentation

## 2024-04-18 DIAGNOSIS — Z800 Family history of malignant neoplasm of digestive organs: Secondary | ICD-10-CM | POA: Diagnosis not present

## 2024-04-18 DIAGNOSIS — Z8052 Family history of malignant neoplasm of bladder: Secondary | ICD-10-CM | POA: Insufficient documentation

## 2024-04-18 DIAGNOSIS — Z9981 Dependence on supplemental oxygen: Secondary | ICD-10-CM | POA: Diagnosis not present

## 2024-04-18 DIAGNOSIS — Z832 Family history of diseases of the blood and blood-forming organs and certain disorders involving the immune mechanism: Secondary | ICD-10-CM | POA: Diagnosis not present

## 2024-04-18 LAB — CBC WITH DIFFERENTIAL (CANCER CENTER ONLY)
Abs Immature Granulocytes: 0.02 K/uL (ref 0.00–0.07)
Basophils Absolute: 0.1 K/uL (ref 0.0–0.1)
Basophils Relative: 1 %
Eosinophils Absolute: 0.3 K/uL (ref 0.0–0.5)
Eosinophils Relative: 3 %
HCT: 34.5 % — ABNORMAL LOW (ref 39.0–52.0)
Hemoglobin: 10.8 g/dL — ABNORMAL LOW (ref 13.0–17.0)
Immature Granulocytes: 0 %
Lymphocytes Relative: 22 %
Lymphs Abs: 2.1 K/uL (ref 0.7–4.0)
MCH: 22.9 pg — ABNORMAL LOW (ref 26.0–34.0)
MCHC: 31.3 g/dL (ref 30.0–36.0)
MCV: 73.2 fL — ABNORMAL LOW (ref 80.0–100.0)
Monocytes Absolute: 0.7 K/uL (ref 0.1–1.0)
Monocytes Relative: 7 %
Neutro Abs: 6.5 K/uL (ref 1.7–7.7)
Neutrophils Relative %: 67 %
Platelet Count: 302 K/uL (ref 150–400)
RBC: 4.71 MIL/uL (ref 4.22–5.81)
RDW: 14.5 % (ref 11.5–15.5)
WBC Count: 9.8 K/uL (ref 4.0–10.5)
nRBC: 0 % (ref 0.0–0.2)

## 2024-04-18 LAB — IRON AND IRON BINDING CAPACITY (CC-WL,HP ONLY)
Iron: 67 ug/dL (ref 45–182)
Saturation Ratios: 19 % (ref 17.9–39.5)
TIBC: 357 ug/dL (ref 250–450)
UIBC: 290 ug/dL (ref 117–376)

## 2024-04-18 LAB — FERRITIN: Ferritin: 234 ng/mL (ref 24–336)

## 2024-04-18 MED ORDER — FERROUS SULFATE 325 (65 FE) MG PO TABS: 325.0000 mg | ORAL_TABLET | Freq: Every day | ORAL | 3 refills | Status: AC

## 2024-04-18 NOTE — Progress Notes (Signed)
 [facility]  HEMATOLOGY CLINIC PROGRESS NOTE  PATIENT NAME: [name] Gregory   MR#: [id number] DOB: [birth year]  Patient Care Team: Katrinka Aquas, MD as PCP - General (Internal Medicine) Darliss Rogue, MD as PCP - Cardiology (Cardiology) Shaaron Lamar HERO, MD as Consulting Physician (Gastroenterology)  Date of visit: 04/18/2024   ASSESSMENT & PLAN:   [name] Gregory is a 53 y.o.  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   Family history of sickle cell disease and multiple myeloma.   On his initial consultation with us  on 11/23/2023, labs showed hemoglobin of 10.7, MCV 70.6.  White count and platelet count were within normal limits.  Creatinine 1.14, calcium  9.1.  Iron studies showed mild iron deficiency with iron saturation of 16%, otherwise unremarkable.  Ferritin normal at 186.  B12, folate, LDH, haptoglobin were all within normal limits.  Coombs test negative.  SPEP showed no evidence of M spike.  IFE was unremarkable.  Kappa was slightly increased at 29.2 mg/L, lambda was normal at 24.4 mg/L, ratio normal at 1.2.  Overall no evidence of monoclonal gammopathy.  Hemoglobin electrophoresis showed normal hemoglobin pattern.  No evidence of beta thalassemia.   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   Alpha thalassemia minor explains chronic microcytic anemia.  No intervention needed at current hemoglobin levels. Treatment is not required unless hemoglobin drops below 8 g/dL, at which point blood transfusion would be considered.  Labs today showed stable hemoglobin of 10.8, MCV 73.2.  White  count and platelet count are within normal limits.  Iron studies today showed no evidence of iron deficiency.  Ferritin pending.  Previously, given mild iron deficiency, he was advised to start taking oral iron supplements once daily.  He can continue the same.  RTC in 1 year for follow-up with repeat labs.  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 Family history of cardiomyopathy with early mortality. Requires continuous oxygen therapy, likely due to heart failure secondary to cardiomyopathy.  Current oxygen requirement is 2 liters at rest, increasing to 3-4 liters with exertion. No recent changes in oxygen needs or symptoms such as chest pain.   I spent a total of 20 minutes during this encounter with the patient including review of chart and various tests results, discussions about plan of care and coordination of care plan.  I reviewed lab results and outside records for this visit and discussed relevant results with the patient. Diagnosis, plan of care and treatment options were also discussed in detail with the patient. Opportunity provided to ask questions and answers provided to his apparent satisfaction. Provided instructions to call our clinic with any problems, questions or concerns prior to return visit. I recommended to continue follow-up with PCP and sub-specialists. He verbalized understanding and agreed with the plan. No barriers to learning was detected.  [name] Patten, MD  04/18/2024 2:27 PM  [facility] - A DEPT OF [facility] [address] [place] [address] Dept: [phone] Dept Fax: [phone]   CHIEF COMPLAINT/ REASON FOR VISIT:  Follow-up for microcytic anemia.  Workup in April  2025 revealed presence of alpha thalassemia minor.  INTERVAL HISTORY:  Discussed the use of AI scribe software for clinical note transcription with the  patient, who gave verbal consent to proceed.  History of Present Illness [name] Gregory is a 53 year old male with alpha thalassemia minor who presents with fatigue.  He experiences persistent fatigue and requires two liters of oxygen, which increases to three to four liters with exertion. No chest pain is reported.  He has a known diagnosis of alpha thalassemia minor, identified during a previous workup for anemia and microcytic red blood cells. His hemoglobin levels have been stable, ranging from 10 to 12, with the most recent measurement at 10.8. Historical records indicate a hemoglobin level of 12.5 in 2010. Other blood counts, including white blood cell and platelet counts, are normal. Previous tests for iron, B12, and folic acid  deficiencies were normal.   SUMMARY OF HEMATOLOGIC HISTORY:  On 09/28/2023, labs showed hemoglobin of 11, hematocrit 36.4, MCV 76.  White count 9100.  Platelet count 401,000.  He was referred to us  for further evaluation of microcytic anemia.   He has a history of anemia characterized by microcytic red blood cells, persisting for several years without improvement. He experienced dark stools indicating possible gastrointestinal bleeding about six months ago, but this has since resolved. No current blood loss, such as epistaxis or gum bleeding. He is maintaining his weight and has no issues with eating.   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 There is a significant family history of heart problems, including his brother who passed away at 42 and his father's sister who passed at 73. Swelling in his legs typically occurs in the evening, and he reports no chest pain beyond his usual experience.   He has a history of acid reflux, irritable bowel syndrome, and pancreatic insufficiency. His last colonoscopy last year showed diverticulosis and diverticulitis.   There is a family history of sickle cell  disease in two first cousins on his mother's side and multiple myeloma in his father, who is 57 years old.    On his initial consultation with us  on 11/23/2023, labs showed hemoglobin of 10.7, MCV 70.6.  White count and platelet count were within normal limits.  Creatinine 1.14, calcium  9.1.  Iron studies showed mild iron deficiency with iron saturation of 16%, otherwise unremarkable.  Ferritin normal at 186.  B12, folate, LDH, haptoglobin were all within normal limits.  Coombs test negative.  SPEP showed no evidence of M spike.  IFE was unremarkable.  Kappa was slightly increased at 29.2 mg/L, lambda was normal at 24.4 mg/L, ratio normal at 1.2.  Overall no evidence of monoclonal gammopathy.   Hemoglobin electrophoresis showed normal hemoglobin pattern.  No evidence of beta thalassemia.   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    Alpha thalassemia minor explains chronic microcytic anemia.  No intervention needed at current hemoglobin levels.  I have reviewed the past medical history, past surgical history, social history and family history with the patient and they are unchanged from previous note.  ALLERGIES: He has no known allergies.  MEDICATIONS:  Current Outpatient Medications  Medication Sig Dispense Refill   albuterol  (VENTOLIN  HFA) 108 (90 Base) MCG/ACT inhaler Inhale 2 puffs into the lungs every 6 (six) hours as needed for wheezing or shortness of breath. 8 g 2   amLODipine (NORVASC) 10 MG tablet Take 10 mg by mouth  daily.     ARIPiprazole (ABILIFY) 2 MG tablet Take 2 mg by mouth daily.     atorvastatin  (LIPITOR) 20 MG tablet TAKE ONE TABLET BY MOUTH EVERY DAY 90 tablet 3   buPROPion  (WELLBUTRIN  XL) 150 MG 24 hr tablet Take 150 mg by mouth 2 (two) times daily.     carvedilol  (COREG ) 25 MG tablet Take 1 tablet (25 mg total) by mouth 2 (two) times daily with a meal. 180 tablet 3   celecoxib  (CELEBREX ) 100 MG capsule Take 1 capsule  (100 mg total) by mouth 2 (two) times daily. 60 capsule 0   citalopram  (CELEXA ) 20 MG tablet Take 1 tablet by mouth daily.     CREON 36000-114000 units CPEP capsule Take 36,000 Units by mouth 3 (three) times daily before meals.     dicyclomine (BENTYL) 10 MG capsule Take 10 mg by mouth in the morning and at bedtime.     erythromycin (E-MYCIN) 250 MG tablet Take 250 mg by mouth 3 (three) times daily.     famotidine (PEPCID) 20 MG tablet Take 20 mg by mouth 2 (two) times daily.     glipiZIDE  (GLUCOTROL  XL) 5 MG 24 hr tablet Take 5 mg by mouth every morning.     JARDIANCE  10 MG TABS tablet TAKE ONE TABLET BY MOUTH EVERY DAY 90 tablet 0   LORazepam  (ATIVAN ) 0.5 MG tablet Take 0.5 mg by mouth every 8 (eight) hours as needed for anxiety.      magnesium oxide (MAG-OX) 400 (240 Mg) MG tablet Take 400 mg by mouth daily.     metFORMIN  (GLUCOPHAGE ) 1000 MG tablet TAKE ONE TABLET BY MOUTH TWICE DAILY WITH MEALS 180 tablet 0   metoCLOPramide  (REGLAN ) 10 MG tablet Take 10 mg by mouth 3 (three) times daily.     omeprazole (PRILOSEC) 40 MG capsule Take 40 mg by mouth daily.     ondansetron  (ZOFRAN -ODT) 4 MG disintegrating tablet Take 4 mg by mouth 2 (two) times daily.     pregabalin (LYRICA) 50 MG capsule Take 50 mg by mouth 3 (three) times daily.     sacubitril-valsartan  (ENTRESTO ) 49-51 MG Take 1 tablet by mouth 2 (two) times daily. 60 tablet 5   spironolactone  (ALDACTONE ) 25 MG tablet TAKE ONE TABLET BY MOUTH EVERY DAY 90 tablet 1   sucralfate (CARAFATE) 1 g tablet Take 1 g by mouth 2 (two) times daily.     tirzepatide  (MOUNJARO ) 10 MG/0.5ML Pen Inject 10 mg into the skin once a week. 6 mL 1   Torsemide  40 MG TABS Take 60 mg by mouth 2 (two) times daily. 270 tablet 1   traMADol  (ULTRAM ) 50 MG tablet Take 1 tablet (50 mg total) by mouth every 6 (six) hours as needed. 20 tablet 0   Vitamin D , Ergocalciferol , (DRISDOL) 50000 units CAPS capsule Take 1 capsule by mouth 3 (three) times a week.     ferrous sulfate   (FEROSUL) 325 (65 FE) MG tablet Take 1 tablet (325 mg total) by mouth daily with breakfast. 30 tablet 3   No current facility-administered medications for this visit.     REVIEW OF SYSTEMS:    Review of Systems - Oncology  All other pertinent systems were reviewed with the patient and are negative.  PHYSICAL EXAMINATION:   Onc Performance Status - 04/18/24 1139       ECOG Perf Status   ECOG Perf Status Ambulatory and capable of all selfcare but unable to carry out any work activities.  Up  and about more than 50% of waking hours      KPS SCALE   KPS % SCORE Normal activity with effort, some s/s of disease          Vitals:   04/18/24 1125  BP: 93/67  Pulse: 88  Resp: 17  Temp: 97.6 F (36.4 C)  SpO2: 98%   Filed Weights   04/18/24 1125  Weight: (!) 316 lb (143.3 kg)    Physical Exam Constitutional:      General: He is not in acute distress.    Appearance: Normal appearance.  HENT:     Head: Normocephalic and atraumatic.  Eyes:     Conjunctiva/sclera: Conjunctivae normal.  Cardiovascular:     Rate and Rhythm: Normal rate and regular rhythm.  Pulmonary:     Effort: Pulmonary effort is normal. No respiratory distress.     Comments: Wearing O2 via nasal cannula Abdominal:     General: There is no distension.  Neurological:     General: No focal deficit present.     Mental Status: He is alert and oriented to person, place, and time.  Psychiatric:        Mood and Affect: Mood normal.        Behavior: Behavior normal.     LABORATORY DATA:   I have reviewed the data as listed.  Results for orders placed or performed in visit on 04/18/24  Iron and Iron Binding Capacity ([facility] only)  Result Value Ref Range   Iron 67 45 - 182 ug/dL   TIBC 642 749 - 549 ug/dL   Saturation Ratios 19 17.9 - 39.5 %   UIBC 290 117 - 376 ug/dL  CBC with Differential (Cancer Center Only)  Result Value Ref Range   WBC Count 9.8 4.0 - 10.5 K/uL   RBC 4.71 4.22 - 5.81 MIL/uL    Hemoglobin 10.8 (L) 13.0 - 17.0 g/dL   HCT 65.4 (L) 60.9 - 47.9 %   MCV 73.2 (L) 80.0 - 100.0 fL   MCH 22.9 (L) 26.0 - 34.0 pg   MCHC 31.3 30.0 - 36.0 g/dL   RDW 85.4 88.4 - 84.4 %   Platelet Count 302 150 - 400 K/uL   nRBC 0.0 0.0 - 0.2 %   Neutrophils Relative % 67 %   Neutro Abs 6.5 1.7 - 7.7 K/uL   Lymphocytes Relative 22 %   Lymphs Abs 2.1 0.7 - 4.0 K/uL   Monocytes Relative 7 %   Monocytes Absolute 0.7 0.1 - 1.0 K/uL   Eosinophils Relative 3 %   Eosinophils Absolute 0.3 0.0 - 0.5 K/uL   Basophils Relative 1 %   Basophils Absolute 0.1 0.0 - 0.1 K/uL   Immature Granulocytes 0 %   Abs Immature Granulocytes 0.02 0.00 - 0.07 K/uL    RADIOGRAPHIC STUDIES:  No recent pertinent imaging studies available to review.  Orders Placed This Encounter  Procedures   CBC with Differential ([facility]cer Center Only)    Standing Status:   Future    Expiration Date:   04/18/2025   Iron and Iron Binding Capacity ([facility] only)    Standing Status:   Future    Expiration Date:   04/18/2025   Ferritin    Standing Status:   Future    Expiration Date:   04/18/2025     Future Appointments  Date Time Provider Department Center  04/24/2024 10:00 AM Burnetta Brunet, DO [facility] None  04/25/2024  9:20 AM [facility] None  05/08/2024 11:30 AM Therisa Benton PARAS, NP [facility] None  05/29/2024 11:40 AM Darliss Rogue, MD [facility] None  03/26/2025  1:00 PM Schnier, Cordella MATSU, MD [facility] None  04/22/2025 10:15 AM [facility] None  04/22/2025 10:45 AM Andrue Dini, Chinita, MD [facility] None    This document was completed utilizing speech recognition software. Grammatical errors, random word insertions, pronoun errors, and incomplete sentences are an occasional consequence of this system due to software limitations, ambient noise, and hardware issues. Any formal questions or concerns about the content, text or information contained within the body of this dictation should be directly  addressed to the provider for clarification.

## 2024-04-18 NOTE — Assessment & Plan Note (Addendum)
 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 Family history of cardiomyopathy with early mortality. Requires continuous oxygen therapy, likely due to heart failure secondary to cardiomyopathy.  Current oxygen requirement is 2 liters at rest, increasing to 3-4 liters with exertion. No recent changes in oxygen needs or symptoms such as chest pain.

## 2024-04-18 NOTE — Assessment & Plan Note (Addendum)
 Chronic anemia with microcytic red blood cells, persisting for several years.   Family history of sickle cell disease and multiple myeloma.   On his initial consultation with us  on 11/23/2023, labs showed hemoglobin of 10.7, MCV 70.6.  White count and platelet count were within normal limits.  Creatinine 1.14, calcium  9.1.  Iron studies showed mild iron deficiency with iron saturation of 16%, otherwise unremarkable.  Ferritin normal at 186.  B12, folate, LDH, haptoglobin were all within normal limits.  Coombs test negative.  SPEP showed no evidence of M spike.  IFE was unremarkable.  Kappa was slightly increased at 29.2 mg/L, lambda was normal at 24.4 mg/L, ratio normal at 1.2.  Overall no evidence of monoclonal gammopathy.  Hemoglobin electrophoresis showed normal hemoglobin pattern.  No evidence of beta thalassemia.   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   Alpha thalassemia minor explains chronic microcytic anemia.  No intervention needed at current hemoglobin levels. Treatment is not required unless hemoglobin drops below 8 g/dL, at which point blood transfusion would be considered.  Labs today showed stable hemoglobin of 10.8, MCV 73.2.  White count and platelet count are within normal limits.  Iron studies today showed no evidence of iron deficiency.  Ferritin pending.  Previously, given mild iron deficiency, he was advised to start taking oral iron supplements once daily.  He can continue the same.  RTC in 1 year for follow-up with repeat labs.

## 2024-04-24 ENCOUNTER — Other Ambulatory Visit: Payer: Self-pay | Admitting: Surgical

## 2024-04-24 ENCOUNTER — Other Ambulatory Visit (INDEPENDENT_AMBULATORY_CARE_PROVIDER_SITE_OTHER): Payer: PRIVATE HEALTH INSURANCE

## 2024-04-24 ENCOUNTER — Ambulatory Visit: Payer: PRIVATE HEALTH INSURANCE | Admitting: Sports Medicine

## 2024-04-24 ENCOUNTER — Encounter: Payer: Self-pay | Admitting: Sports Medicine

## 2024-04-24 DIAGNOSIS — M5030 Other cervical disc degeneration, unspecified cervical region: Secondary | ICD-10-CM

## 2024-04-24 DIAGNOSIS — M47816 Spondylosis without myelopathy or radiculopathy, lumbar region: Secondary | ICD-10-CM | POA: Diagnosis not present

## 2024-04-24 DIAGNOSIS — M62838 Other muscle spasm: Secondary | ICD-10-CM

## 2024-04-24 DIAGNOSIS — M542 Cervicalgia: Secondary | ICD-10-CM

## 2024-04-24 DIAGNOSIS — M51362 Other intervertebral disc degeneration, lumbar region with discogenic back pain and lower extremity pain: Secondary | ICD-10-CM

## 2024-04-24 DIAGNOSIS — M899 Disorder of bone, unspecified: Secondary | ICD-10-CM

## 2024-04-24 DIAGNOSIS — G8929 Other chronic pain: Secondary | ICD-10-CM

## 2024-04-24 DIAGNOSIS — M5442 Lumbago with sciatica, left side: Secondary | ICD-10-CM | POA: Diagnosis not present

## 2024-04-24 MED ORDER — LIDOCAINE HCL 1 % IJ SOLN
1.0000 mL | INTRAMUSCULAR | Status: AC | PRN
  Administered 2024-04-24: 1 mL

## 2024-04-24 MED ORDER — METHYLPREDNISOLONE ACETATE 40 MG/ML IJ SUSP
40.0000 mg | INTRAMUSCULAR | Status: AC | PRN
  Administered 2024-04-24: 40 mg via INTRAMUSCULAR

## 2024-04-24 MED ORDER — BUPIVACAINE HCL 0.25 % IJ SOLN
1.0000 mL | INTRAMUSCULAR | Status: AC | PRN
  Administered 2024-04-24: 1 mL

## 2024-04-24 NOTE — Progress Notes (Signed)
 Patient is complaining of neck and low back pain. He has pain from DDD but had increased pain from lifting his son off of a mower about a month ago. He states he has sharp pain and tightness in his right shoulder blade and posterior neck. Pain increases in his neck with lateral flexion and extension. He denies having any numbness and tingling in his neck and shoulder. His neck and shoulder pain happens with movement and throughout the day. He states he has numbness and tingling down his left hip and leg. His pain increases with walking and lifting. He states he has constant pain in his low back. He is taking tramadol  for his back pain, which relieves some discomfort.

## 2024-04-24 NOTE — Progress Notes (Addendum)
 [name] Gregory - 53 y.o. male MRN [id number]  Date of birth: 01-20-1971  Office Visit Note: Visit Date: 04/24/2024 PCP: Katrinka Aquas, MD Referred by: Katrinka Aquas, MD  Subjective: Chief Complaint  Patient presents with  . Neck - Pain  . Lower Back - Pain   HPI: [name] Gregory is a pleasant 53 y.o. male who presents today for acute on chronic right-sided neck and low back pain with RLE radic.  Neck -having right sided cervical pain that radiates into the trapezius and feels restrictive in the scapular location.  He denies any numbness or tingling going down the arms.  Using Celebrex  100 mg once to twice daily.  He also is using tramadol  sparingly which he received from Dr. Addie team given his shoulder surgery.  Low back -he has a chronic history of low back pain, however about 1 month ago he was trying to pull out a 0 turn mower when his son got stuck and he feels this exacerbated his pain.  His pain in the low back worsened and he is now experiencing radicular symptoms of numbness and tingling down the lateral thigh and the anterior shin to the ankle.  He has done physical therapy in the past and at an outside orthopedic facility did receive a lumbar ESI injection, however he had an adverse event with report of nicking the nerve on the way out and so is cautious about repeating this.  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  He has been as to his PT from this but still continues home exercises.  Lab Results  Component Value Date   HGBA1C 5.7 (A) 10/11/2023   Pertinent ROS were reviewed with the patient and found to be negative unless otherwise specified above in HPI.   Assessment & Plan: Visit Diagnoses:  1. DDD (degenerative disc disease), cervical   2. Chronic bilateral low back pain with left-sided sciatica   3. Degeneration of intervertebral disc of lumbar region with discogenic  back pain and lower extremity pain   4. Facet arthritis of lumbar region   5. Scapular dysfunction   6. Trapezius muscle spasm   7. Neck pain    Plan: Impression is acute on chronic low back pain with newer onset left lower extremity radiculopathy which does seem to fit more than L5, possibly L4 nerve root distribution.  This was worsened after attempting to lift a mower to help his son get unstuck.  He has completed therapy and then exercises for the back before and including any lumbar spine injection in the past, but does not have any updated MRI.  Given the chronicity with radicular symptoms and now left lower extremity weakness I think it is pertinent to obtain an MRI to evaluate for disc pathology and neural impingement. I will see him back 1 week after MRI to review and discuss next steps.  He is okay to continue his Celebrex  100 mg once to twice daily as needed in the interim.  In terms of his neck pain, he does have mild DDD but I believe this is more trapezius hypertonicity with scapular dysfunction and trigger points.  We did perform trigger point for the trapezius and the medial scapular border, patient tolerated well.  Advised on postinjection protocol.  I will see him back 1 week after MRI to review and discuss next steps for the lumbar spine.  Will discuss refilling tramadol  on his  original prescriber, Dr. Brien Magnant  Follow-up: Return for f/u 1-week after lumbar MRI.   Meds & Orders:  Meds ordered this encounter  Medications  . bupivacaine  (MARCAINE ) 0.25 % (with pres) injection 1 mL  . bupivacaine  (MARCAINE ) 0.25 % (with pres) injection 1 mL  . lidocaine  (XYLOCAINE ) 1 % (with pres) injection 1 mL  . lidocaine  (XYLOCAINE ) 1 % (with pres) injection 1 mL  . methylPREDNISolone  acetate (DEPO-MEDROL ) injection 40 mg    Orders Placed This Encounter  Procedures  . Trigger Point Inj  . XR Lumbar Spine Complete  . XR Cervical Spine 2 or 3 views  . MR Lumbar Spine w/o contrast      Procedures: Trigger Point Inj  Date/Time: 04/24/2024 11:35 AM  Performed by: Burnetta Brunet, DO Authorized by: Burnetta Brunet, DO   Consent Given by:  Patient Site marked: the procedure site was marked   Timeout: prior to procedure the correct patient, procedure, and site was verified   Indications:  Muscle spasm and pain Total # of Trigger Points:  2 Location: neck and shoulder   Needle Size:  25 G Approach:  Dorsal Medications #1:  1 mL lidocaine  1 %; 1 mL bupivacaine  0.25 %; 40 mg methylPREDNISolone  acetate 40 MG/ML Medications #2:  1 mL lidocaine  1 %; 1 mL bupivacaine  0.25 % Comments: Procedure: Trigger point injections (2), right trapezius and right medial scapular border After discussion on R/B/I and informed verbal consent was obtained, a timeout was conducted. The patient was placed in a prone position on the examination table and the area of maximal tenderness was identified over the right trapezius and right medial scapular border.  This area was cleansed with Betadine and multiple alcohol swabs. Ethyl chloride was used for local anesthesia. Using a 25-gauge 1.5 inch needle the trigger point(s) was subsequently injected with a mixture of 1 cc of methylprednisolone  40 mg/mL and 1 cc of 1% lidocaine  without epinephrine  and 1 cc of bupivicaine 0.25%, with a total of 2.5 cc of injectate into each trigger point. A band-aid was applied following. Patient tolerated procedure well, there were no post-injection complications. Post-procedure instructions were given.         Clinical History: No specialty comments available.  He reports that he quit smoking about 2 years ago. His smoking use included cigarettes. He started smoking about 38 years ago. He has a 9 pack-year smoking history. He has never used smokeless tobacco.  Recent Labs    10/11/23 1028  HGBA1C 5.7*    Objective:    Physical Exam  Gen: Well-appearing, in no acute distress; non-toxic CV: Well-perfused. Warm.   Resp: Breathing unlabored on room air; no wheezing. Psych: Fluid speech in conversation; appropriate affect; normal thought process  Ortho Exam - Cervical/Trap/Scapular: No midline spinous process TTP.  There is right-sided paraspinal and trapezius hypertonicity with associated trigger points here and over the medial scapular border.  There is right shoulder scapular dysfunction without scapular winging.  - Lumbar: There is positive TTP over the midline of the lumbar spine near the L4 and L5 region.  There is full range of motion with flexion, there is restriction with extension and associated pain.  Positive straight leg raise on the left.  There is 4/5 strength with weakness with left knee extension into a smaller degree dorsiflexion, compared to 5/5 strength of the right lower extremity dermatome in the L2/S1 nerve root.  Imaging: No results found.   Past Medical/Family/Surgical/Social History: Medications & Allergies reviewed per EMR,  new medications updated. Patient Active Problem List   Diagnosis Date Noted  . Asthma, chronic 03/25/2024  . Adhesive capsulitis of right shoulder 01/26/2024  . Hx of smoking 10/12/2023  . Lymphedema 03/26/2023  . Bilateral sensorineural hearing loss 01/04/2023  . Chronic respiratory failure with hypoxia (HCC) 01/02/2022  . HTN (hypertension) 06/10/2020  . Loose stools 06/09/2020  . GERD (gastroesophageal reflux disease) 06/09/2020  . Elevated LFTs 06/09/2020  . Early satiety 06/09/2020  . Fatty liver 06/09/2020  . Dizziness   . Hyperglycemia   . Liver enzyme elevation   . Nausea vomiting and diarrhea   . Transaminitis   . AKI (acute kidney injury) 07/17/2019  . Abdominal pain 07/17/2019  . Nasal congestion 07/17/2019  . Post-nasal drainage 07/17/2019  . OSA on CPAP 06/14/2017  . Morbid (severe) obesity due to excess calories (HCC) 06/14/2017  . Abdominal pain, epigastric 04/24/2016  . RUQ pain 04/24/2016  . Alpha thalassaemia minor  04/24/2016  . Diarrhea 04/24/2016  . Elevated sed rate 04/24/2016  . Splenomegaly 04/24/2016   Past Medical History:  Diagnosis Date  . Alpha thalassaemia minor   . Anxiety   . Carpal tunnel syndrome   . CHF (congestive heart failure) (HCC)   . Complication of anesthesia    WOKE UP DURING SURGERY  . Diabetes (HCC)   . Fatty liver   . GERD (gastroesophageal reflux disease)   . Hemangioma of liver   . Hypertension   . NICM (nonischemic cardiomyopathy) (HCC) 11/2021  . Rotator cuff disorder   . Sleep apnea   . URI (upper respiratory infection)    Family History  Problem Relation Age of Onset  . Hypertension Mother   . CVA Father   . Diabetes Father   . Throat cancer Father   . Heart disease Brother 42  . Cervical cancer Maternal Grandmother   . Throat cancer Maternal Grandmother   . Throat cancer Maternal Grandfather   . Bladder Cancer Maternal Grandfather   . Cirrhosis Maternal Grandfather   . Colon cancer Neg Hx    Past Surgical History:  Procedure Laterality Date  . BIOPSY  05/04/2016   Procedure: BIOPSY;  Surgeon: Lamar CHRISTELLA Hollingshead, MD;  Location: [facility];  Service: Endoscopy;;  gastric bx  . CARDIAC CATHETERIZATION Bilateral 10/2021  . COLONOSCOPY WITH PROPOFOL  N/A 05/04/2016    PROPOFOL ;  Surgeon: Lamar CHRISTELLA Hollingshead, MD; diverticulosis throughout the entire colon, otherwise normal.  Repeat due in 2027.  SABRA ESOPHAGOGASTRODUODENOSCOPY (EGD) WITH PROPOFOL  N/A 05/04/2016   PROPOFOL ;  Surgeon: Lamar CHRISTELLA Hollingshead, MD; LA grade B esophagitis, gastritis, small hiatal hernia.  SABRA EXAM UNDER ANESTHESIA WITH MANIPULATION OF SHOULDER Right 01/24/2024   Procedure: EXAM UNDER ANESTHESIA, SHOULDER, WITH MANIPULATION;  Surgeon: Addie Cordella Hamilton, MD;  Location: [facility] OR;  Service: Orthopedics;  Laterality: Right;  RIGHT SHOULDER MANIPULATION UNDER ANESTHESIA, ARTHROSCOPY, DEBRIDEMENT, ROTATOR INTERVAL RELEASE  . EYE SURGERY    . POSTERIOR LUMBAR FUSION 2 WITH HARDWARE REMOVAL Right  01/24/2024   Procedure: ARTHROSCOPY, SHOULDER WITH DEBRIDEMENT AND ROTATOR INTERVALRELEASE;  Surgeon: Addie Cordella Hamilton, MD;  Location: [facility] OR;  Service: Orthopedics;  Laterality: Right;  . RIGHT HEART CATH Right 06/28/2022   Procedure: RIGHT HEART CATH;  Surgeon: Cherrie Toribio SAUNDERS, MD;  Location: [facility] INVASIVE CV LAB;  Service: Cardiovascular;  Laterality: Right;  . SALIVARY GLAND SURGERY  1992   Social History   Occupational History  . Occupation: administrative for [employer]  Tobacco Use  . Smoking status: Former  Current packs/day: 0.00    Average packs/day: 0.3 packs/day for 36.0 years (9.0 ttl pk-yrs)    Types: Cigarettes    Start date: 11/18/1985    Quit date: 11/18/2021    Years since quitting: 2.5  . Smokeless tobacco: Never  . Tobacco comments:    QUIT SMOKING IN APRIL  Vaping Use  . Vaping status: Former  . Quit date: 09/28/2023  Substance and Sexual Activity  . Alcohol use: Not Currently    Comment: maybe once a month  . Drug use: No  . Sexual activity: Not on file

## 2024-04-25 ENCOUNTER — Other Ambulatory Visit: Payer: PRIVATE HEALTH INSURANCE

## 2024-04-30 ENCOUNTER — Encounter: Payer: Self-pay | Admitting: Sports Medicine

## 2024-05-01 ENCOUNTER — Other Ambulatory Visit: Payer: PRIVATE HEALTH INSURANCE

## 2024-05-02 ENCOUNTER — Other Ambulatory Visit: Payer: PRIVATE HEALTH INSURANCE

## 2024-05-06 ENCOUNTER — Other Ambulatory Visit: Payer: PRIVATE HEALTH INSURANCE

## 2024-05-08 ENCOUNTER — Ambulatory Visit: Payer: PRIVATE HEALTH INSURANCE | Admitting: Nurse Practitioner

## 2024-05-12 ENCOUNTER — Other Ambulatory Visit: Payer: Self-pay | Admitting: Nurse Practitioner

## 2024-05-13 ENCOUNTER — Other Ambulatory Visit: Payer: PRIVATE HEALTH INSURANCE

## 2024-05-17 ENCOUNTER — Ambulatory Visit
Admission: RE | Admit: 2024-05-17 | Discharge: 2024-05-17 | Disposition: A | Discharge status: Home / Self Care | Payer: PRIVATE HEALTH INSURANCE | Source: Ambulatory Visit | Attending: Sports Medicine | Admitting: Sports Medicine

## 2024-05-17 DIAGNOSIS — M51362 Other intervertebral disc degeneration, lumbar region with discogenic back pain and lower extremity pain: Secondary | ICD-10-CM

## 2024-05-17 DIAGNOSIS — G8929 Other chronic pain: Secondary | ICD-10-CM

## 2024-05-21 ENCOUNTER — Other Ambulatory Visit: Payer: Self-pay | Admitting: Surgical

## 2024-05-21 MED ORDER — TRAMADOL HCL 50 MG PO TABS: 50.0000 mg | ORAL_TABLET | Freq: Every day | ORAL | 0 refills | Status: DC | PRN

## 2024-05-25 ENCOUNTER — Other Ambulatory Visit: Payer: Self-pay | Admitting: Nurse Practitioner

## 2024-05-25 ENCOUNTER — Other Ambulatory Visit: Payer: Self-pay | Admitting: Family

## 2024-05-29 ENCOUNTER — Ambulatory Visit: Payer: PRIVATE HEALTH INSURANCE | Admitting: Cardiology

## 2024-05-30 ENCOUNTER — Ambulatory Visit (INDEPENDENT_AMBULATORY_CARE_PROVIDER_SITE_OTHER): Payer: PRIVATE HEALTH INSURANCE | Admitting: Sports Medicine

## 2024-05-30 ENCOUNTER — Encounter: Payer: Self-pay | Admitting: Sports Medicine

## 2024-05-30 DIAGNOSIS — G8929 Other chronic pain: Secondary | ICD-10-CM

## 2024-05-30 DIAGNOSIS — R29898 Other symptoms and signs involving the musculoskeletal system: Secondary | ICD-10-CM | POA: Diagnosis not present

## 2024-05-30 DIAGNOSIS — M62838 Other muscle spasm: Secondary | ICD-10-CM | POA: Diagnosis not present

## 2024-05-30 DIAGNOSIS — M51362 Other intervertebral disc degeneration, lumbar region with discogenic back pain and lower extremity pain: Secondary | ICD-10-CM

## 2024-05-30 DIAGNOSIS — M5442 Lumbago with sciatica, left side: Secondary | ICD-10-CM

## 2024-05-30 MED ORDER — PREGABALIN 75 MG PO CAPS: 75.0000 mg | ORAL_CAPSULE | Freq: Two times a day (BID) | ORAL | 0 refills | Status: DC

## 2024-05-30 NOTE — Progress Notes (Signed)
 [name] Gregory - 53 y.o. male MRN [id number]  Date of birth: 11-17-1970  Office Visit Note: Visit Date: 05/30/2024 PCP: Katrinka Aquas, MD Referred by: Katrinka Aquas, MD  Subjective: Chief Complaint  Patient presents with   Lower Back - Follow-up   HPI: [name] Gregory is a pleasant 53 y.o. male who presents today for follow-up of low back pain with LLE radiculopathy and weakness, MRI review.  Kerem states his back pain is about the same, he has both pain in the back as well as numbness and tingling down the posterior lateral thigh that radiates over the anterior calf and into the foot.  He does report feeling the left leg is weaker and this also has affected his gait which has been ongoing for the last few months up to the last year.  He is on Lyrica 75 mg once daily and does take tramadol  for pain.  He is progressing well through his arthroscopic rotator cuff repair and manipulation under anesthesia (AC) for the right shoulder.  He has trapezius/neck pain doing better after trigger point injection.   Pertinent ROS were reviewed with the patient and found to be negative unless otherwise specified above in HPI.   Assessment & Plan: Visit Diagnoses:  1. 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 Degeneration of intervertebral disc of lumbar region with discogenic back pain and lower extremity pain   3. Left leg weakness   4. Trapezius muscle spasm    Plan: Impression is acute on chronic low back pain with left-sided radiculopathy as well as few month history of associated left lower extremity weakness (L5-S1 myotome weakness on PE).  This does correlate with his lumbar MRI with large disc bulge and impingement upon the traversing S1 nerve root.  I do think he would be a candidate for left L5-S1 transforaminal injection, he is open to this but he did have a bad experience from [facility] from outside provider years ago.  Given his associated weakness however, I do think it would be  wise to have him see our spine physician, Dr. Georgina, to discuss possible surgical intervention in the setting of foraminotomy/laminectomy versus trialing lumbar ESI.  Will defer this decision to him upon their discussion.  To help with his radicular symptoms, we will increase his Lyrica from 75 mg once daily to twice daily.  His neck and trapezius is doing better after the trigger point injection, recommended continuing.  Referral sent to Dr. Georgina for discussion on next steps, I am happy to see Erron back as needed.  Follow-up: Return for Make appt with Dr. Georgina .   Meds & Orders:  Meds ordered this encounter  Medications   pregabalin (LYRICA) 75 MG capsule    Sig: Take 1 capsule (75 mg total) by mouth 2 (two) times daily.    Dispense:  60 capsule    Refill:  0    Orders Placed This Encounter  Procedures   Ambulatory referral to Orthopedic Surgery     Procedures: No procedures performed      Clinical History: No specialty comments available.  He reports that he quit smoking about 2 years ago. His smoking use included cigarettes. He started smoking about 38 years ago. He has a 9 pack-year smoking history. He has never used smokeless tobacco.  Recent Labs    10/11/23 1028  HGBA1C 5.7*    Objective:    Physical Exam  Gen: Well-appearing, in no acute distress; non-toxic CV:  Well-perfused. Warm.  Resp: Breathing unlabored on room air; no wheezing. Psych: Fluid speech in conversation; appropriate affect; normal thought process  Ortho Exam - Lumbar: There is mild tenderness in the low back near the L4-S1 level.  There is some pain and restriction with lumbar extension. + Slump's test on the left.  There is mild weakness with left knee extension (possibly 2/2 pain), although true weakness with 4/5 strength with left ankle dorsiflexion and great toe extension.   He does have a mildly functional dropfoot through ambulation as his toes occasionally graze the ground during swing  phase.  Imaging:  I did independently review lumbar spine MRI.  There is moderate disc bulge at the L5-S1 level (in setting of transitional lumbosacral anatomy) with more transforaminal/subarticular mass effect that does appear to impinge upon the bilateral (L > R) S1 nerve root.  Degenerative disc disease and cervical facet arthritis.  Narrative & Impression  EXAM: MRI LUMBAR SPINE 05/17/2024 09:30:58 AM   TECHNIQUE: Multiplanar multisequence MRI of the lumbar spine was performed without the administration of intravenous contrast.   COMPARISON: MRI lumbar spine 03/27/2010, lumbar spine radiographs 04/24/2024.   CLINICAL HISTORY: Low back pain, symptoms persist with > 6 wks treatment; Lumbar radiculopathy, symptoms persist with > 6 wks treatment.   FINDINGS:   BONES AND ALIGNMENT: Transitional lumbosacral anatomy with partial lumbarization of the S1 vertebral segment. The lowest fully formed disc space is labeled S1-S2. Modic type 2 degenerative endplate marrow signal changes at L5-S1. Benign vertebral hemangioma in the L3 vertebral body. Normal alignment. Normal vertebral body heights.   SPINAL CORD: The conus is not imaged.   SOFT TISSUES: No paraspinal mass.   L1-L2: No significant disc herniation. No spinal canal stenosis or neural foraminal narrowing.   L2-L3: No significant disc herniation. No spinal canal stenosis or neural foraminal narrowing.   L3-L4: Small disc bulge and mild bilateral facet arthropathy. No significant spinal canal stenosis or neural foraminal narrowing.   L4-L5: Mild bilateral facet arthropathy without spinal canal stenosis or significant neural foraminal narrowing.   L5-S1: Right greater than left facet arthropathy with periarticular edema on the right. Disc bulge contributes to moderate narrowing of the bilateral subarticular zones with mass effect on the traversing S1 nerve roots. Moderate left neural foraminal narrowing.    IMPRESSION: 1. 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 2. Moderate bilateral subarticular zone narrowing at L5-S1 with mass effect on the traversing S1 nerve roots and moderate left neural foraminal narrowing.   Electronically signed by: Ryan Chess MD 05/23/2024 08:52 AM EDT RP Workstation: [id number]    XR Lumbar Spine Complete Complete view of the lumbar spine x-ray including AP, lateral,  flexion/extension views were ordered and reviewed by myself today.  X-ray  demonstrates moderate intervertebral disc space narrowing with lumbar DDD  at the L4-L5 level.  The upper lumbar spine has preserved disc spaces.   There is no instability with flexion and extension views.  There is  associated facet arthropathy at the L4-L5 level. Transitional lumbosacral  anatomy noted with extra L6.     Past Medical/Family/Surgical/Social History: Medications & Allergies reviewed per EMR, new medications updated. Patient Active Problem List   Diagnosis Date Noted   Asthma, chronic 03/25/2024   Adhesive capsulitis of right shoulder 01/26/2024   Hx of smoking 10/12/2023   Lymphedema 03/26/2023   Bilateral sensorineural hearing loss 01/04/2023   Chronic respiratory failure with hypoxia (HCC) 01/02/2022  HTN (hypertension) 06/10/2020   Loose stools 06/09/2020   GERD (gastroesophageal reflux disease) 06/09/2020   Elevated LFTs 06/09/2020   Early satiety 06/09/2020   Fatty liver 06/09/2020   Dizziness    Hyperglycemia    Liver enzyme elevation    Nausea vomiting and diarrhea    Transaminitis    AKI (acute kidney injury) 07/17/2019   Abdominal pain 07/17/2019   Nasal congestion 07/17/2019   Post-nasal drainage 07/17/2019   OSA on CPAP 06/14/2017   Morbid (severe) obesity due to excess calories (HCC) 06/14/2017   Abdominal pain, epigastric 04/24/2016   RUQ pain 04/24/2016   Alpha thalassaemia minor 04/24/2016    Diarrhea 04/24/2016   Elevated sed rate 04/24/2016   Splenomegaly 04/24/2016   Past Medical History:  Diagnosis Date   Alpha thalassaemia minor    Anxiety    Carpal tunnel syndrome    CHF (congestive heart failure) (HCC)    Complication of anesthesia    WOKE UP DURING SURGERY   Diabetes (HCC)    Fatty liver    GERD (gastroesophageal reflux disease)    Hemangioma of liver    Hypertension    NICM (nonischemic cardiomyopathy) (HCC) 11/2021   Rotator cuff disorder    Sleep apnea    URI (upper respiratory infection)    Family History  Problem Relation Age of Onset   Hypertension Mother    CVA Father    Diabetes Father    Throat cancer Father    Heart disease Brother 75   Cervical cancer Maternal Grandmother    Throat cancer Maternal Grandmother    Throat cancer Maternal Grandfather    Bladder Cancer Maternal Grandfather    Cirrhosis Maternal Grandfather    Colon cancer Neg Hx    Past Surgical History:  Procedure Laterality Date   BIOPSY  05/04/2016   Procedure: BIOPSY;  Surgeon: Lamar CHRISTELLA Hollingshead, MD;  Location: [facility];  Service: Endoscopy;;  gastric bx   CARDIAC CATHETERIZATION Bilateral 10/2021   COLONOSCOPY WITH PROPOFOL  N/A 05/04/2016    PROPOFOL ;  Surgeon: Lamar CHRISTELLA Hollingshead, MD; diverticulosis throughout the entire colon, otherwise normal.  Repeat due in 2027.   ESOPHAGOGASTRODUODENOSCOPY (EGD) WITH PROPOFOL  N/A 05/04/2016   PROPOFOL ;  Surgeon: Lamar CHRISTELLA Hollingshead, MD; LA grade B esophagitis, gastritis, small hiatal hernia.   EXAM UNDER ANESTHESIA WITH MANIPULATION OF SHOULDER Right 01/24/2024   Procedure: EXAM UNDER ANESTHESIA, SHOULDER, WITH MANIPULATION;  Surgeon: Addie Cordella Hamilton, MD;  Location: [facility] OR;  Service: Orthopedics;  Laterality: Right;  RIGHT SHOULDER MANIPULATION UNDER ANESTHESIA, ARTHROSCOPY, DEBRIDEMENT, ROTATOR INTERVAL RELEASE   EYE SURGERY     POSTERIOR LUMBAR FUSION 2 WITH HARDWARE REMOVAL Right 01/24/2024   Procedure: ARTHROSCOPY, SHOULDER WITH  DEBRIDEMENT AND ROTATOR INTERVALRELEASE;  Surgeon: Addie Cordella Hamilton, MD;  Location: [facility] OR;  Service: Orthopedics;  Laterality: Right;   RIGHT HEART CATH Right 06/28/2022   Procedure: RIGHT HEART CATH;  Surgeon: Cherrie Toribio SAUNDERS, MD;  Location: [facility] INVASIVE CV LAB;  Service: Cardiovascular;  Laterality: Right;   SALIVARY GLAND SURGERY  1992   Social History   Occupational History   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

## 2024-05-31 ENCOUNTER — Other Ambulatory Visit: Payer: Self-pay | Admitting: Cardiology

## 2024-06-03 ENCOUNTER — Other Ambulatory Visit: Payer: Self-pay | Admitting: Medical Genetics

## 2024-06-03 DIAGNOSIS — Z006 Encounter for examination for normal comparison and control in clinical research program: Secondary | ICD-10-CM

## 2024-06-09 ENCOUNTER — Telehealth: Payer: Self-pay | Admitting: Radiology

## 2024-06-09 NOTE — Telephone Encounter (Signed)
 ok

## 2024-06-09 NOTE — Telephone Encounter (Signed)
 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  Please advise. OK for refill?

## 2024-06-10 ENCOUNTER — Other Ambulatory Visit: Payer: Self-pay | Admitting: Surgical

## 2024-06-10 MED ORDER — TRAMADOL HCL 50 MG PO TABS: 50.0000 mg | ORAL_TABLET | Freq: Every day | ORAL | 0 refills | Status: AC | PRN

## 2024-06-10 NOTE — Telephone Encounter (Signed)
 Sent in refill

## 2024-06-13 ENCOUNTER — Ambulatory Visit: Payer: PRIVATE HEALTH INSURANCE | Admitting: Cardiology

## 2024-06-16 ENCOUNTER — Other Ambulatory Visit: Payer: Self-pay | Admitting: Surgical

## 2024-06-16 ENCOUNTER — Encounter: Payer: Self-pay | Admitting: Radiology

## 2024-06-18 ENCOUNTER — Encounter: Payer: Self-pay | Admitting: Cardiology

## 2024-06-18 ENCOUNTER — Ambulatory Visit: Payer: PRIVATE HEALTH INSURANCE | Attending: Cardiology | Admitting: Cardiology

## 2024-06-18 VITALS — BP 128/78 | HR 88 | Ht 74.0 in | Wt 313.0 lb

## 2024-06-18 DIAGNOSIS — I428 Other cardiomyopathies: Secondary | ICD-10-CM

## 2024-06-18 DIAGNOSIS — I10 Essential (primary) hypertension: Secondary | ICD-10-CM

## 2024-06-18 NOTE — Progress Notes (Signed)
 Cardiology Office Note:    Date:  06/18/2024   ID:  [name] Gregory, DOB 20-Mar-1971, MRN [id number]  PCP:  Katrinka Aquas, MD   [facility] Providers Cardiologist:  Redell Cave, MD     Referring MD: Katrinka Aquas, MD   Chief Complaint  Patient presents with   Follow-up    6 month follow up pt has been doing well with no complaints of chest pain, chest pressure or SOB, medciation reviewed verbally with patient    History of Present Illness:    [name] Gregory is a 53 y.o. male with a hx of NICM (initial EF 25%, last EF 55-60%), hypertension, diabetes, OSA/CPAP, former smoker who presents for follow-up.  States feeling fair, denies chest pain or shortness of breath, endorses feeling tired.  States gaining about 6 pounds over the past 24 hours.  Compliant with torsemide  60 mg twice daily as prescribed.  Endorses low-salt diet.  Denies edema.   Prior notes Echo 10/2023 EF 55 to 60%. CMR 11/23 EF 52% Echo 7/23 EF 45 to 50% Outside records/reports from [facility] Echo 11/18/2020 EF 20 to 25% Left heart cath 11/18/2021 no evidence for CAD Brother has history of CHF, died at age 40  Past Medical History:  Diagnosis Date   Alpha thalassaemia minor    Anxiety    Carpal tunnel syndrome    CHF (congestive heart failure) (HCC)    Complication of anesthesia    WOKE UP DURING SURGERY   Diabetes (HCC)    Fatty liver    GERD (gastroesophageal reflux disease)    Hemangioma of liver    Hypertension    NICM (nonischemic cardiomyopathy) (HCC) 11/2021   Rotator cuff disorder    Sleep apnea    URI (upper respiratory infection)     Past Surgical History:  Procedure Laterality Date   BIOPSY  05/04/2016   Procedure: BIOPSY;  Surgeon: Lamar CHRISTELLA Hollingshead, MD;  Location: [facility];  Service: Endoscopy;;  gastric bx   CARDIAC CATHETERIZATION Bilateral 10/2021   COLONOSCOPY WITH PROPOFOL  N/A 05/04/2016    PROPOFOL ;  Surgeon: Lamar CHRISTELLA Hollingshead, MD; diverticulosis throughout the entire colon,  otherwise normal.  Repeat due in 2027.   ESOPHAGOGASTRODUODENOSCOPY (EGD) WITH PROPOFOL  N/A 05/04/2016   PROPOFOL ;  Surgeon: Lamar CHRISTELLA Hollingshead, MD; LA grade B esophagitis, gastritis, small hiatal hernia.   EXAM UNDER ANESTHESIA WITH MANIPULATION OF SHOULDER Right 01/24/2024   Procedure: EXAM UNDER ANESTHESIA, SHOULDER, WITH MANIPULATION;  Surgeon: Addie Cordella Hamilton, MD;  Location: [facility] OR;  Service: Orthopedics;  Laterality: Right;  RIGHT SHOULDER MANIPULATION UNDER ANESTHESIA, ARTHROSCOPY, DEBRIDEMENT, ROTATOR INTERVAL RELEASE   EYE SURGERY     POSTERIOR LUMBAR FUSION 2 WITH HARDWARE REMOVAL Right 01/24/2024   Procedure: ARTHROSCOPY, SHOULDER WITH DEBRIDEMENT AND ROTATOR INTERVALRELEASE;  Surgeon: Addie Cordella Hamilton, MD;  Location: [facility] OR;  Service: Orthopedics;  Laterality: Right;   RIGHT HEART CATH Right 06/28/2022   Procedure: RIGHT HEART CATH;  Surgeon: Cherrie Toribio SAUNDERS, MD;  Location: [facility] INVASIVE CV LAB;  Service: Cardiovascular;  Laterality: Right;   SALIVARY GLAND SURGERY  1992    Current Medications: Current Meds  Medication Sig   albuterol  (VENTOLIN  HFA) 108 (90 Base) MCG/ACT inhaler Inhale 2 puffs into the lungs every 6 (six) hours as needed for wheezing or shortness of breath.   ARIPiprazole (ABILIFY) 2 MG tablet Take 2 mg by mouth daily.   atorvastatin  (LIPITOR) 20 MG tablet TAKE ONE TABLET BY MOUTH EVERY DAY   buPROPion  (WELLBUTRIN   XL) 150 MG 24 hr tablet Take 150 mg by mouth 2 (two) times daily.   carvedilol  (COREG ) 25 MG tablet Take 1 tablet (25 mg total) by mouth 2 (two) times daily with a meal.   celecoxib  (CELEBREX ) 100 MG capsule Take 1 capsule (100 mg total) by mouth 2 (two) times daily.   citalopram  (CELEXA ) 20 MG tablet Take 1 tablet by mouth daily.   CREON 36000-114000 units CPEP capsule Take 36,000 Units by mouth 3 (three) times daily before meals.   dicyclomine (BENTYL) 10 MG capsule Take 10 mg by mouth in the morning and at bedtime.   erythromycin (E-MYCIN) 250 MG  tablet Take 250 mg by mouth 3 (three) times daily.   famotidine (PEPCID) 20 MG tablet Take 20 mg by mouth 2 (two) times daily.   ferrous sulfate  (FEROSUL) 325 (65 FE) MG tablet Take 1 tablet (325 mg total) by mouth daily with breakfast.   JARDIANCE  10 MG TABS tablet TAKE ONE TABLET BY MOUTH EVERY DAY   LORazepam  (ATIVAN ) 0.5 MG tablet Take 0.5 mg by mouth every 8 (eight) hours as needed for anxiety.    magnesium oxide (MAG-OX) 400 (240 Mg) MG tablet Take 400 mg by mouth daily.   metFORMIN  (GLUCOPHAGE ) 1000 MG tablet TAKE ONE TABLET BY MOUTH TWICE DAILY WITH MEALS   metoCLOPramide  (REGLAN ) 10 MG tablet Take 10 mg by mouth 3 (three) times daily.   MOUNJARO  10 MG/0.5ML Pen INJECT 10MG  ONCE WEEKLY   omeprazole (PRILOSEC) 40 MG capsule Take 40 mg by mouth daily.   ondansetron  (ZOFRAN -ODT) 4 MG disintegrating tablet Take 4 mg by mouth 2 (two) times daily.   pregabalin (LYRICA) 75 MG capsule Take 1 capsule (75 mg total) by mouth 2 (two) times daily.   sacubitril-valsartan  (ENTRESTO ) 49-51 MG TAKE ONE TABLET BY MOUTH TWICE DAILY   sertraline (ZOLOFT) 50 MG tablet 1 tablet in the morning Orally Once a day; Duration: 30 days   spironolactone  (ALDACTONE ) 25 MG tablet TAKE ONE TABLET BY MOUTH EVERY DAY   sucralfate (CARAFATE) 1 g tablet Take 1 g by mouth 2 (two) times daily.   Torsemide  40 MG TABS Take 60 mg by mouth 2 (two) times daily.   traMADol  (ULTRAM ) 50 MG tablet Take 1 tablet (50 mg total) by mouth daily as needed. Do not operate a motor vehicle or heavy machinery while taking this medication   Vitamin D , Ergocalciferol , (DRISDOL) 50000 units CAPS capsule Take 1 capsule by mouth 3 (three) times a week.     Allergies:   Patient has no known allergies.   Social History   Socioeconomic History   Marital status: Married    Spouse name: Not on file   Number of children: Not on file   Years of education: Not on file   Highest education level: Not on file  Occupational History   Occupation:  administrative for [employer]  Tobacco Use   Smoking status: Former    Current packs/day: 0.00    Average packs/day: 0.3 packs/day for 36.0 years (9.0 ttl pk-yrs)    Types: Cigarettes    Start date: 11/18/1985    Quit date: 11/18/2021    Years since quitting: 2.5   Smokeless tobacco: Never   Tobacco comments:    QUIT SMOKING IN APRIL  Vaping Use   Vaping status: Former   Quit date: 09/28/2023  Substance and Sexual Activity   Alcohol use: Not Currently    Comment: maybe once a month   Drug use:  No   Sexual activity: Not on file  Other Topics Concern   Not on file  Social History Narrative   Not on file   Social Drivers of Health   Financial Resource Strain: Unknown (07/17/2019)   Overall Financial Resource Strain (CARDIA)    Difficulty of Paying Living Expenses: Patient declined  Food Insecurity: No Food Insecurity (11/23/2023)   Hunger Vital Sign    Worried About Running Out of Food in the Last Year: Never true    Ran Out of Food in the Last Year: Never true  Transportation Needs: No Transportation Needs (11/23/2023)   PRAPARE - Administrator, Civil Service (Medical): No    Lack of Transportation (Non-Medical): No  Physical Activity: Unknown (07/17/2019)   Exercise Vital Sign    Days of Exercise per Week: Patient declined    Minutes of Exercise per Session: Patient declined  Stress: Not on file  Social Connections: Unknown (07/17/2019)   Social Connection and Isolation Panel    Frequency of Communication with Friends and Family: Patient declined    Frequency of Social Gatherings with Friends and Family: Patient declined    Attends Religious Services: Patient declined    Database Administrator or Organizations: Patient declined    Attends Engineer, Structural: Patient declined    Marital Status: Patient declined     Family History: The patient's family history includes Bladder Cancer in his maternal grandfather; CVA in his father; Cervical cancer in his  maternal grandmother; Cirrhosis in his maternal grandfather; Diabetes in his father; Heart disease (age of onset: 64) in his brother; Hypertension in his mother; Throat cancer in his father, maternal grandfather, and maternal grandmother. There is no history of Colon cancer.  ROS:   Please see the history of present illness.     All other systems reviewed and are negative.  EKGs/Labs/Other Studies Reviewed:    The following studies were reviewed today:  EKG Interpretation Date/Time:  Wednesday June 18 2024 08:12:37 EST Ventricular Rate:  88 PR Interval:  184 QRS Duration:  88 QT Interval:  366 QTC Calculation: 442 R Axis:   27  Text Interpretation: Normal sinus rhythm Normal ECG Confirmed by Darliss Rogue ([id number]) on 06/18/2024 8:31:47 AM    Recent Labs: 09/28/2023: BNP <2.5; TSH 0.952 11/23/2023: ALT 12 01/24/2024: BUN 23; Creatinine, Ser 1.17; Potassium 4.5; Sodium 135 04/18/2024: Hemoglobin 10.8; Platelet Count 302  Recent Lipid Panel    Component Value Date/Time   CHOL 121 06/26/2023 1038   TRIG 148 06/26/2023 1038   HDL 40 06/26/2023 1038   CHOLHDL 3.0 06/26/2023 1038   CHOLHDL 5.0 12/16/2008 0340   VLDL 41 (H) 12/16/2008 0340   LDLCALC 55 06/26/2023 1038     Risk Assessment/Calculations:          Physical Exam:    VS:  BP 128/78 (BP Location: Left Arm, Patient Position: Sitting, Cuff Size: Normal)   Pulse 88   Ht 6' 2 (1.88 m)   Wt (!) 313 lb (142 kg)   SpO2 97%   BMI 40.19 kg/m     Wt Readings from Last 3 Encounters:  06/18/24 (!) 313 lb (142 kg)  04/18/24 (!) 316 lb (143.3 kg)  03/27/24 (!) 309 lb 9.6 oz (140.4 kg)     GEN:  Well nourished, well developed in no acute distress HEENT: Normal NECK: No JVD; No carotid bruits CARDIAC: RRR, no murmurs, rubs, gallops RESPIRATORY: Diminished breath sounds at bases, no wheezing ABDOMEN:  Soft, non-tender, distended MUSCULOSKELETAL: no edema; No deformity  SKIN: Warm and dry NEUROLOGIC:  Alert and  oriented x 3 PSYCHIATRIC:  Normal affect   ASSESSMENT:    1. NICM (nonischemic cardiomyopathy) (HCC)   2. Primary hypertension    PLAN:    In order of problems listed above:  Nonischemic cardiomyopathy, etiology possibly hereditary (brother with HF at age 53).  Initial echo 20 to 25%, last echo 3/25 EF 55-60 %.  Abdominal distention, okay to take extra dose of of torsemide  for weight gain over 3 pounds in 24 hours or 5 pounds in a week.  I do we typically ranges 307 to 310 pounds.  Continue Entresto  49-51 mg twice daily, Coreg  25 mg twice daily, Aldactone  12.5 mg daily, torsemide  60 mg twice daily, Jardiance .  Had symptoms of fatigue with higher doses of Entresto . Hypertension, BP controlled.  Continue Entresto  49-51 mg twice daily, Coreg  25 mg twice daily, Aldactone  12.5 mg daily.   Follow-up in 3 months.      Medication Adjustments/Labs and Tests Ordered: Current medicines are reviewed at length with the patient today.  Concerns regarding medicines are outlined above.  Orders Placed This Encounter  Procedures   EKG 12-Lead   No orders of the defined types were placed in this encounter.   Patient Instructions  Medication Instructions:  Your physician recommends that you continue on your current medications as directed. Please refer to the Current Medication list given to you today.   *If you need a refill on your cardiac medications before your next appointment, please call your pharmacy*  Lab Work: No labs ordered today  If you have labs (blood work) drawn today and your tests are completely normal, you will receive your results only by: MyChart Message (if you have MyChart) OR A paper copy in the mail If you have any lab test that is abnormal or we need to change your treatment, we will call you to review the results.  Testing/Procedures: No test ordered today   Follow-Up: At [facility], you and your health needs are our priority.  As part of our continuing  mission to provide you with exceptional heart care, our providers are all part of one team.  This team includes your primary Cardiologist (physician) and Advanced Practice Providers or APPs (Physician Assistants and Nurse Practitioners) who all work together to provide you with the care you need, when you need it.  Your next appointment:   3 month(s)  Provider:   You may see Redell Cave, MD or one of the following Advanced Practice Providers on your designated Care Team:   Lonni Meager, NP Lesley Maffucci, PA-C Bernardino Bring, PA-C Cadence Albright, PA-C Tylene Lunch, NP Barnie Hila, NP    We recommend signing up for the patient portal called MyChart.  Sign up information is provided on this After Visit Summary.  MyChart is used to connect with patients for Virtual Visits (Telemedicine).  Patients are able to view lab/test results, encounter notes, upcoming appointments, etc.  Non-urgent messages can be sent to your provider as well.   To learn more about what you can do with MyChart, go to [email or web address].         Signed, Redell Cave, MD  06/18/2024 10:15 AM    [facility]

## 2024-06-18 NOTE — Patient Instructions (Signed)
 Medication Instructions:   Your physician recommends that you continue on your current medications as directed. Please refer to the Current Medication list given to you today.   *If you need a refill on your cardiac medications before your next appointment, please call your pharmacy*  Lab Work:  No labs ordered today   If you have labs (blood work) drawn today and your tests are completely normal, you will receive your results only by: MyChart Message (if you have MyChart) OR A paper copy in the mail If you have any lab test that is abnormal or we need to change your treatment, we will call you to review the results.  Testing/Procedures:  No test ordered today   Follow-Up: At [facility], you and your health needs are our priority.  As part of our continuing mission to provide you with exceptional heart care, our providers are all part of one team.  This team includes your primary Cardiologist (physician) and Advanced Practice Providers or APPs (Physician Assistants and Nurse Practitioners) who all work together to provide you with the care you need, when you need it.  Your next appointment:   3 month(s)  Provider:   You may see Constancia Delton, MD or one of the following Advanced Practice Providers on your designated Care Team:   Laneta Pintos, NP Gildardo Labrador, PA-C Varney Gentleman, PA-C Cadence Francis, PA-C Ronald Cockayne, NP Morey Ar, NP    We recommend signing up for the patient portal called MyChart.  Sign up information is provided on this After Visit Summary.  MyChart is used to connect with patients for Virtual Visits (Telemedicine).  Patients are able to view lab/test results, encounter notes, upcoming appointments, etc.  Non-urgent messages can be sent to your provider as well.   To learn more about what you can do with MyChart, go to [email or web address].

## 2024-06-21 ENCOUNTER — Other Ambulatory Visit: Payer: Self-pay | Admitting: Sports Medicine

## 2024-06-27 ENCOUNTER — Other Ambulatory Visit: Payer: Self-pay | Admitting: Cardiology

## 2024-06-27 ENCOUNTER — Other Ambulatory Visit: Payer: Self-pay | Admitting: Orthopedic Surgery

## 2024-06-27 ENCOUNTER — Telehealth: Payer: Self-pay | Admitting: Radiology

## 2024-06-27 MED ORDER — TRAMADOL HCL 50 MG PO TABS: 50.0000 mg | ORAL_TABLET | Freq: Two times a day (BID) | ORAL | 0 refills | Status: DC | PRN

## 2024-06-27 NOTE — Telephone Encounter (Signed)
 Sent thx

## 2024-06-27 NOTE — Telephone Encounter (Signed)
 Received refill request on Tramadol  50mg  tablet 1 po every day daily prn #20, last filled 06/10/2024. Please advise.

## 2024-07-01 MED ORDER — TORSEMIDE 40 MG PO TABS: 60.0000 mg | ORAL_TABLET | Freq: Two times a day (BID) | ORAL | 3 refills | Status: AC

## 2024-07-07 ENCOUNTER — Ambulatory Visit: Payer: PRIVATE HEALTH INSURANCE | Admitting: Orthopedic Surgery

## 2024-07-19 ENCOUNTER — Other Ambulatory Visit: Payer: Self-pay | Admitting: Sports Medicine

## 2024-07-21 ENCOUNTER — Ambulatory Visit: Payer: PRIVATE HEALTH INSURANCE | Admitting: Nurse Practitioner

## 2024-07-21 DIAGNOSIS — E119 Type 2 diabetes mellitus without complications: Secondary | ICD-10-CM

## 2024-07-21 DIAGNOSIS — Z7984 Long term (current) use of oral hypoglycemic drugs: Secondary | ICD-10-CM

## 2024-07-21 DIAGNOSIS — Z7985 Long-term (current) use of injectable non-insulin antidiabetic drugs: Secondary | ICD-10-CM

## 2024-07-30 ENCOUNTER — Encounter: Payer: Self-pay | Admitting: Orthopedic Surgery

## 2024-07-31 ENCOUNTER — Other Ambulatory Visit: Payer: Self-pay | Admitting: Orthopedic Surgery

## 2024-07-31 MED ORDER — TRAMADOL HCL 50 MG PO TABS: 50.0000 mg | ORAL_TABLET | Freq: Two times a day (BID) | ORAL | 0 refills | Status: AC | PRN

## 2024-08-16 ENCOUNTER — Other Ambulatory Visit: Payer: Self-pay | Admitting: Sports Medicine

## 2024-08-16 ENCOUNTER — Other Ambulatory Visit: Payer: Self-pay | Admitting: Nurse Practitioner

## 2024-08-18 ENCOUNTER — Encounter: Payer: Self-pay | Admitting: Nurse Practitioner

## 2024-08-18 ENCOUNTER — Telehealth: Payer: Self-pay

## 2024-08-18 ENCOUNTER — Other Ambulatory Visit (HOSPITAL_COMMUNITY): Payer: Self-pay

## 2024-08-18 NOTE — Telephone Encounter (Signed)
 Pharmacy Patient Advocate Encounter   Received notification from Patient Advice Request messages that prior authorization for Mounjaro  10MG /0.5ML auto-injectors  is required/requested.   Insurance verification completed.   The patient is insured through [employer] and [employer].   Per test claim: Through [employer]: PA required; PA submitted to above mentioned insurance via Latent Key/confirmation #/EOC [id number] Status is pending

## 2024-08-18 NOTE — Telephone Encounter (Signed)
 Patient states he needs PA for his Mounjaro .

## 2024-08-19 ENCOUNTER — Encounter (HOSPITAL_COMMUNITY): Payer: Self-pay | Admitting: Orthopedic Surgery

## 2024-08-25 ENCOUNTER — Ambulatory Visit: Admitting: Nurse Practitioner

## 2024-08-25 DIAGNOSIS — E119 Type 2 diabetes mellitus without complications: Secondary | ICD-10-CM

## 2024-08-25 DIAGNOSIS — Z7985 Long-term (current) use of injectable non-insulin antidiabetic drugs: Secondary | ICD-10-CM

## 2024-08-25 DIAGNOSIS — Z7984 Long term (current) use of oral hypoglycemic drugs: Secondary | ICD-10-CM

## 2024-08-28 ENCOUNTER — Other Ambulatory Visit: Payer: Self-pay | Admitting: *Deleted

## 2024-08-28 ENCOUNTER — Telehealth: Payer: Self-pay | Admitting: Nurse Practitioner

## 2024-08-28 ENCOUNTER — Telehealth: Payer: Self-pay

## 2024-08-28 DIAGNOSIS — Z7984 Long term (current) use of oral hypoglycemic drugs: Secondary | ICD-10-CM

## 2024-08-28 DIAGNOSIS — E119 Type 2 diabetes mellitus without complications: Secondary | ICD-10-CM

## 2024-08-28 DIAGNOSIS — Z7985 Long-term (current) use of injectable non-insulin antidiabetic drugs: Secondary | ICD-10-CM

## 2024-08-28 MED ORDER — ACCU-CHEK SOFTCLIX LANCETS MISC: 3 refills | Status: AC

## 2024-08-28 MED ORDER — ACCU-CHEK GUIDE TEST VI STRP: ORAL_STRIP | 3 refills | Status: AC

## 2024-08-28 MED ORDER — ACCU-CHEK GUIDE ME W/DEVICE KIT: PACK | 0 refills | Status: AC

## 2024-08-28 NOTE — Telephone Encounter (Signed)
 Pt needs new meter rx sent to [facility]

## 2024-08-28 NOTE — Telephone Encounter (Signed)
 Patient called and ask that we sent a prescription in to [facility] for a meter, lancets, test strips. This has been done.

## 2024-08-28 NOTE — Telephone Encounter (Signed)
 This has been done.

## 2024-08-29 ENCOUNTER — Ambulatory Visit: Admitting: Nurse Practitioner

## 2024-09-03 ENCOUNTER — Ambulatory Visit: Payer: PRIVATE HEALTH INSURANCE | Admitting: Orthopedic Surgery

## 2024-09-18 ENCOUNTER — Ambulatory Visit: Payer: PRIVATE HEALTH INSURANCE | Admitting: Cardiology

## 2024-09-26 ENCOUNTER — Ambulatory Visit: Payer: PRIVATE HEALTH INSURANCE | Admitting: Cardiology

## 2024-11-18 ENCOUNTER — Ambulatory Visit: Admitting: Nurse Practitioner

## 2025-03-26 ENCOUNTER — Ambulatory Visit (INDEPENDENT_AMBULATORY_CARE_PROVIDER_SITE_OTHER): Payer: PRIVATE HEALTH INSURANCE | Admitting: Vascular Surgery

## 2025-04-22 ENCOUNTER — Ambulatory Visit: Payer: PRIVATE HEALTH INSURANCE | Admitting: Oncology

## 2025-04-22 ENCOUNTER — Other Ambulatory Visit: Payer: PRIVATE HEALTH INSURANCE
